# Patient Record
Sex: Female | Born: 1975 | Race: White | Hispanic: No | Marital: Married | Smoking: Former smoker
Health system: Southern US, Community
[De-identification: ages and names within clinical notes are randomized; demographics above are authoritative.]

## PROBLEM LIST (undated history)

## (undated) DIAGNOSIS — M459 Ankylosing spondylitis of unspecified sites in spine: Secondary | ICD-10-CM

## (undated) DIAGNOSIS — B977 Papillomavirus as the cause of diseases classified elsewhere: Secondary | ICD-10-CM

## (undated) DIAGNOSIS — E039 Hypothyroidism, unspecified: Secondary | ICD-10-CM

## (undated) DIAGNOSIS — T7840XA Allergy, unspecified, initial encounter: Secondary | ICD-10-CM

## (undated) DIAGNOSIS — J45909 Unspecified asthma, uncomplicated: Secondary | ICD-10-CM

## (undated) DIAGNOSIS — R079 Chest pain, unspecified: Secondary | ICD-10-CM

## (undated) DIAGNOSIS — R7303 Prediabetes: Secondary | ICD-10-CM

## (undated) DIAGNOSIS — N3010 Interstitial cystitis (chronic) without hematuria: Secondary | ICD-10-CM

## (undated) DIAGNOSIS — M1990 Unspecified osteoarthritis, unspecified site: Secondary | ICD-10-CM

## (undated) DIAGNOSIS — Z9889 Other specified postprocedural states: Secondary | ICD-10-CM

## (undated) DIAGNOSIS — F32A Depression, unspecified: Secondary | ICD-10-CM

## (undated) DIAGNOSIS — Z9049 Acquired absence of other specified parts of digestive tract: Secondary | ICD-10-CM

## (undated) DIAGNOSIS — M5090 Cervical disc disorder, unspecified, unspecified cervical region: Secondary | ICD-10-CM

## (undated) DIAGNOSIS — R599 Enlarged lymph nodes, unspecified: Secondary | ICD-10-CM

## (undated) DIAGNOSIS — R202 Paresthesia of skin: Secondary | ICD-10-CM

## (undated) DIAGNOSIS — K219 Gastro-esophageal reflux disease without esophagitis: Secondary | ICD-10-CM

## (undated) DIAGNOSIS — F329 Major depressive disorder, single episode, unspecified: Secondary | ICD-10-CM

## (undated) DIAGNOSIS — IMO0002 Reserved for concepts with insufficient information to code with codable children: Secondary | ICD-10-CM

## (undated) HISTORY — DX: Ankylosing spondylitis of unspecified sites in spine: M45.9

## (undated) HISTORY — DX: Allergy, unspecified, initial encounter: T78.40XA

## (undated) HISTORY — DX: Enlarged lymph nodes, unspecified: R59.9

## (undated) HISTORY — DX: Acquired absence of other specified parts of digestive tract: Z90.49

## (undated) HISTORY — PX: TONSILLECTOMY: SUR1361

---

## 2003-09-12 HISTORY — PX: TOTAL THYROIDECTOMY: SHX2547

## 2007-07-14 ENCOUNTER — Ambulatory Visit: Payer: Self-pay | Admitting: Family Medicine

## 2007-10-17 ENCOUNTER — Encounter: Payer: Self-pay | Admitting: General Practice

## 2007-11-03 ENCOUNTER — Ambulatory Visit: Payer: Self-pay | Admitting: Family Medicine

## 2008-02-19 ENCOUNTER — Ambulatory Visit: Payer: Self-pay | Admitting: Internal Medicine

## 2008-07-02 ENCOUNTER — Inpatient Hospital Stay: Payer: Self-pay | Admitting: Internal Medicine

## 2008-07-02 ENCOUNTER — Ambulatory Visit: Payer: Self-pay | Admitting: Internal Medicine

## 2008-10-27 ENCOUNTER — Ambulatory Visit: Payer: Self-pay | Admitting: Neurology

## 2008-11-26 ENCOUNTER — Encounter: Payer: Self-pay | Admitting: Neurology

## 2009-01-21 ENCOUNTER — Ambulatory Visit: Payer: Self-pay | Admitting: Neurology

## 2009-04-19 ENCOUNTER — Encounter: Payer: Self-pay | Admitting: Neurology

## 2009-05-03 ENCOUNTER — Ambulatory Visit: Payer: Self-pay | Admitting: Gastroenterology

## 2009-05-05 ENCOUNTER — Ambulatory Visit: Payer: Self-pay | Admitting: Gastroenterology

## 2009-05-12 ENCOUNTER — Encounter: Payer: Self-pay | Admitting: Neurology

## 2009-05-21 ENCOUNTER — Ambulatory Visit: Payer: Self-pay | Admitting: General Practice

## 2009-05-26 ENCOUNTER — Emergency Department: Payer: Self-pay | Admitting: Emergency Medicine

## 2009-06-11 ENCOUNTER — Encounter: Payer: Self-pay | Admitting: Neurology

## 2009-06-16 ENCOUNTER — Ambulatory Visit: Payer: Self-pay | Admitting: Cardiology

## 2010-01-07 ENCOUNTER — Other Ambulatory Visit: Payer: Self-pay | Admitting: Dermatology

## 2010-04-02 ENCOUNTER — Ambulatory Visit: Payer: Self-pay | Admitting: Internal Medicine

## 2010-06-06 ENCOUNTER — Ambulatory Visit: Payer: Self-pay | Admitting: General Practice

## 2010-06-11 ENCOUNTER — Ambulatory Visit: Payer: Self-pay | Admitting: General Practice

## 2010-12-30 ENCOUNTER — Other Ambulatory Visit: Payer: Self-pay | Admitting: Family Medicine

## 2011-03-12 ENCOUNTER — Ambulatory Visit: Payer: Self-pay | Admitting: Internal Medicine

## 2011-06-04 ENCOUNTER — Ambulatory Visit: Payer: Self-pay

## 2012-02-20 ENCOUNTER — Ambulatory Visit: Payer: Self-pay | Admitting: Urology

## 2012-08-02 ENCOUNTER — Ambulatory Visit: Payer: Self-pay | Admitting: Family Medicine

## 2012-08-13 ENCOUNTER — Ambulatory Visit: Payer: Self-pay | Admitting: Family Medicine

## 2012-11-05 ENCOUNTER — Ambulatory Visit: Payer: Self-pay

## 2012-11-05 LAB — TSH: Thyroid Stimulating Horm: 0.495 u[IU]/mL

## 2013-06-26 ENCOUNTER — Ambulatory Visit: Payer: Self-pay | Admitting: General Practice

## 2013-08-21 ENCOUNTER — Ambulatory Visit: Payer: Self-pay | Admitting: Orthopedic Surgery

## 2013-12-31 ENCOUNTER — Other Ambulatory Visit: Payer: Self-pay | Admitting: Family Medicine

## 2013-12-31 LAB — TSH: Thyroid Stimulating Horm: 1.09 u[IU]/mL

## 2014-01-30 DIAGNOSIS — E039 Hypothyroidism, unspecified: Secondary | ICD-10-CM | POA: Insufficient documentation

## 2014-02-10 DIAGNOSIS — K219 Gastro-esophageal reflux disease without esophagitis: Secondary | ICD-10-CM | POA: Insufficient documentation

## 2014-08-27 ENCOUNTER — Emergency Department: Payer: Self-pay | Admitting: Emergency Medicine

## 2014-08-27 LAB — CBC WITH DIFFERENTIAL/PLATELET
Basophil #: 0.1 10*3/uL (ref 0.0–0.1)
Basophil %: 0.6 %
Eosinophil #: 0.3 10*3/uL (ref 0.0–0.7)
Eosinophil %: 1.9 %
HCT: 40.6 % (ref 35.0–47.0)
HGB: 13.4 g/dL (ref 12.0–16.0)
Lymphocyte #: 3.4 10*3/uL (ref 1.0–3.6)
Lymphocyte %: 24.8 %
MCH: 28.7 pg (ref 26.0–34.0)
MCHC: 32.9 g/dL (ref 32.0–36.0)
MCV: 87 fL (ref 80–100)
Monocyte #: 1.1 x10 3/mm — ABNORMAL HIGH (ref 0.2–0.9)
Monocyte %: 7.8 %
Neutrophil #: 8.9 10*3/uL — ABNORMAL HIGH (ref 1.4–6.5)
Neutrophil %: 64.9 %
Platelet: 406 10*3/uL (ref 150–440)
RBC: 4.65 10*6/uL (ref 3.80–5.20)
RDW: 13.6 % (ref 11.5–14.5)
WBC: 13.7 10*3/uL — ABNORMAL HIGH (ref 3.6–11.0)

## 2014-08-27 LAB — BASIC METABOLIC PANEL
Anion Gap: 11 (ref 7–16)
BUN: 8 mg/dL (ref 7–18)
Calcium, Total: 8.8 mg/dL (ref 8.5–10.1)
Chloride: 104 mmol/L (ref 98–107)
Co2: 23 mmol/L (ref 21–32)
Creatinine: 0.69 mg/dL (ref 0.60–1.30)
EGFR (African American): >60
EGFR (Non-African Amer.): >60
Glucose: 100 mg/dL — ABNORMAL HIGH (ref 65–99)
Osmolality: 274 (ref 275–301)
Potassium: 3.7 mmol/L (ref 3.5–5.1)
Sodium: 138 mmol/L (ref 136–145)

## 2014-08-27 LAB — TROPONIN I: Troponin-I: <0.02 ng/mL

## 2014-12-16 ENCOUNTER — Other Ambulatory Visit
Admit: 2014-12-16 | Disposition: A | Discharge status: Home / Self Care | Payer: Self-pay | Attending: Internal Medicine | Admitting: Internal Medicine

## 2014-12-16 LAB — TSH: Thyroid Stimulating Horm: 5.062 u[IU]/mL — ABNORMAL HIGH

## 2015-01-26 ENCOUNTER — Other Ambulatory Visit
Admission: RE | Admit: 2015-01-26 | Discharge: 2015-01-26 | Disposition: A | Discharge status: Home / Self Care | Payer: 59 | Source: Ambulatory Visit | Attending: Internal Medicine | Admitting: Internal Medicine

## 2015-01-26 DIAGNOSIS — G629 Polyneuropathy, unspecified: Secondary | ICD-10-CM | POA: Insufficient documentation

## 2015-01-26 LAB — VITAMIN B12: Vitamin B-12: 630 pg/mL (ref 180–914)

## 2015-01-27 LAB — HEMOGLOBIN A1C: Hgb A1c MFr Bld: 5.5 % (ref 4.0–6.0)

## 2015-07-21 ENCOUNTER — Ambulatory Visit: Payer: 59 | Admitting: Physical Therapy

## 2015-09-22 ENCOUNTER — Other Ambulatory Visit: Payer: Self-pay | Admitting: Obstetrics and Gynecology

## 2015-09-22 ENCOUNTER — Ambulatory Visit
Admission: RE | Admit: 2015-09-22 | Discharge: 2015-09-22 | Disposition: A | Discharge status: Home / Self Care | Payer: 59 | Source: Ambulatory Visit | Attending: Obstetrics and Gynecology | Admitting: Obstetrics and Gynecology

## 2015-09-22 DIAGNOSIS — Z1231 Encounter for screening mammogram for malignant neoplasm of breast: Secondary | ICD-10-CM

## 2015-09-28 ENCOUNTER — Other Ambulatory Visit: Payer: Self-pay | Admitting: Obstetrics and Gynecology

## 2015-09-28 DIAGNOSIS — R928 Other abnormal and inconclusive findings on diagnostic imaging of breast: Secondary | ICD-10-CM

## 2015-10-06 ENCOUNTER — Ambulatory Visit
Admission: RE | Admit: 2015-10-06 | Discharge: 2015-10-06 | Disposition: A | Discharge status: Home / Self Care | Payer: 59 | Source: Ambulatory Visit | Attending: Obstetrics and Gynecology | Admitting: Obstetrics and Gynecology

## 2015-10-06 DIAGNOSIS — R928 Other abnormal and inconclusive findings on diagnostic imaging of breast: Secondary | ICD-10-CM

## 2015-10-06 DIAGNOSIS — N6489 Other specified disorders of breast: Secondary | ICD-10-CM | POA: Diagnosis not present

## 2015-10-08 ENCOUNTER — Encounter: Payer: Self-pay | Admitting: *Deleted

## 2015-10-11 ENCOUNTER — Ambulatory Visit: Payer: 59 | Admitting: Anesthesiology

## 2015-10-11 ENCOUNTER — Encounter
Admission: RE | Disposition: A | Discharge status: Home / Self Care | Payer: Self-pay | Source: Ambulatory Visit | Attending: Gastroenterology

## 2015-10-11 ENCOUNTER — Ambulatory Visit
Admission: RE | Admit: 2015-10-11 | Discharge: 2015-10-11 | Disposition: A | Discharge status: Home / Self Care | Payer: 59 | Source: Ambulatory Visit | Attending: Gastroenterology | Admitting: Gastroenterology

## 2015-10-11 DIAGNOSIS — K644 Residual hemorrhoidal skin tags: Secondary | ICD-10-CM | POA: Insufficient documentation

## 2015-10-11 DIAGNOSIS — Z882 Allergy status to sulfonamides status: Secondary | ICD-10-CM | POA: Insufficient documentation

## 2015-10-11 DIAGNOSIS — K5790 Diverticulosis of intestine, part unspecified, without perforation or abscess without bleeding: Secondary | ICD-10-CM | POA: Diagnosis not present

## 2015-10-11 DIAGNOSIS — D120 Benign neoplasm of cecum: Secondary | ICD-10-CM | POA: Insufficient documentation

## 2015-10-11 DIAGNOSIS — E7439 Other disorders of intestinal carbohydrate absorption: Secondary | ICD-10-CM | POA: Insufficient documentation

## 2015-10-11 DIAGNOSIS — J45909 Unspecified asthma, uncomplicated: Secondary | ICD-10-CM | POA: Insufficient documentation

## 2015-10-11 DIAGNOSIS — K2210 Ulcer of esophagus without bleeding: Secondary | ICD-10-CM | POA: Insufficient documentation

## 2015-10-11 DIAGNOSIS — Z86010 Personal history of colonic polyps: Secondary | ICD-10-CM | POA: Diagnosis not present

## 2015-10-11 DIAGNOSIS — R109 Unspecified abdominal pain: Secondary | ICD-10-CM | POA: Diagnosis not present

## 2015-10-11 DIAGNOSIS — D127 Benign neoplasm of rectosigmoid junction: Secondary | ICD-10-CM | POA: Diagnosis not present

## 2015-10-11 DIAGNOSIS — E039 Hypothyroidism, unspecified: Secondary | ICD-10-CM | POA: Insufficient documentation

## 2015-10-11 DIAGNOSIS — F329 Major depressive disorder, single episode, unspecified: Secondary | ICD-10-CM | POA: Insufficient documentation

## 2015-10-11 DIAGNOSIS — K224 Dyskinesia of esophagus: Secondary | ICD-10-CM | POA: Diagnosis not present

## 2015-10-11 DIAGNOSIS — R1013 Epigastric pain: Secondary | ICD-10-CM | POA: Diagnosis present

## 2015-10-11 DIAGNOSIS — Z79899 Other long term (current) drug therapy: Secondary | ICD-10-CM | POA: Insufficient documentation

## 2015-10-11 DIAGNOSIS — Z87891 Personal history of nicotine dependence: Secondary | ICD-10-CM | POA: Insufficient documentation

## 2015-10-11 DIAGNOSIS — R1310 Dysphagia, unspecified: Secondary | ICD-10-CM | POA: Insufficient documentation

## 2015-10-11 DIAGNOSIS — K641 Second degree hemorrhoids: Secondary | ICD-10-CM | POA: Diagnosis not present

## 2015-10-11 DIAGNOSIS — K449 Diaphragmatic hernia without obstruction or gangrene: Secondary | ICD-10-CM | POA: Diagnosis not present

## 2015-10-11 DIAGNOSIS — K219 Gastro-esophageal reflux disease without esophagitis: Secondary | ICD-10-CM | POA: Diagnosis present

## 2015-10-11 DIAGNOSIS — M1990 Unspecified osteoarthritis, unspecified site: Secondary | ICD-10-CM | POA: Diagnosis not present

## 2015-10-11 DIAGNOSIS — K648 Other hemorrhoids: Secondary | ICD-10-CM | POA: Diagnosis not present

## 2015-10-11 DIAGNOSIS — Z885 Allergy status to narcotic agent status: Secondary | ICD-10-CM | POA: Insufficient documentation

## 2015-10-11 DIAGNOSIS — N6489 Other specified disorders of breast: Secondary | ICD-10-CM

## 2015-10-11 DIAGNOSIS — K635 Polyp of colon: Secondary | ICD-10-CM | POA: Diagnosis not present

## 2015-10-11 DIAGNOSIS — K208 Other esophagitis: Secondary | ICD-10-CM | POA: Diagnosis not present

## 2015-10-11 DIAGNOSIS — K210 Gastro-esophageal reflux disease with esophagitis: Secondary | ICD-10-CM | POA: Insufficient documentation

## 2015-10-11 DIAGNOSIS — R1030 Lower abdominal pain, unspecified: Secondary | ICD-10-CM | POA: Insufficient documentation

## 2015-10-11 DIAGNOSIS — Z888 Allergy status to other drugs, medicaments and biological substances status: Secondary | ICD-10-CM | POA: Diagnosis not present

## 2015-10-11 HISTORY — DX: Paresthesia of skin: R20.2

## 2015-10-11 HISTORY — DX: Unspecified osteoarthritis, unspecified site: M19.90

## 2015-10-11 HISTORY — DX: Depression, unspecified: F32.A

## 2015-10-11 HISTORY — DX: Other specified postprocedural states: Z98.890

## 2015-10-11 HISTORY — PX: COLONOSCOPY WITH PROPOFOL: SHX5780

## 2015-10-11 HISTORY — DX: Hypothyroidism, unspecified: E03.9

## 2015-10-11 HISTORY — DX: Papillomavirus as the cause of diseases classified elsewhere: B97.7

## 2015-10-11 HISTORY — DX: Interstitial cystitis (chronic) without hematuria: N30.10

## 2015-10-11 HISTORY — DX: Reserved for concepts with insufficient information to code with codable children: IMO0002

## 2015-10-11 HISTORY — DX: Major depressive disorder, single episode, unspecified: F32.9

## 2015-10-11 HISTORY — DX: Unspecified asthma, uncomplicated: J45.909

## 2015-10-11 HISTORY — DX: Allergy, unspecified, initial encounter: T78.40XA

## 2015-10-11 HISTORY — PX: ESOPHAGOGASTRODUODENOSCOPY (EGD) WITH PROPOFOL: SHX5813

## 2015-10-11 HISTORY — DX: Gastro-esophageal reflux disease without esophagitis: K21.9

## 2015-10-11 HISTORY — DX: Cervical disc disorder, unspecified, unspecified cervical region: M50.90

## 2015-10-11 LAB — POCT PREGNANCY, URINE: Preg Test, Ur: NEGATIVE

## 2015-10-11 SURGERY — ESOPHAGOGASTRODUODENOSCOPY (EGD) WITH PROPOFOL
Anesthesia: General

## 2015-10-11 MED ORDER — LIDOCAINE HCL (CARDIAC) 20 MG/ML IV SOLN
INTRAVENOUS | Status: DC | PRN
  Administered 2015-10-11: 100 mg via INTRAVENOUS

## 2015-10-11 MED ORDER — PROPOFOL 10 MG/ML IV BOLUS
INTRAVENOUS | Status: DC | PRN
  Administered 2015-10-11: 150 mg via INTRAVENOUS

## 2015-10-11 MED ORDER — SODIUM CHLORIDE 0.9 % IV SOLN: INTRAVENOUS | Status: DC

## 2015-10-11 MED ORDER — SODIUM CHLORIDE 0.9 % IV SOLN
INTRAVENOUS | Status: DC
  Administered 2015-10-11: 12:00:00 via INTRAVENOUS
  Administered 2015-10-11: 1000 mL via INTRAVENOUS

## 2015-10-11 MED ORDER — ONDANSETRON HCL 4 MG/2ML IJ SOLN
INTRAMUSCULAR | Status: DC | PRN
  Administered 2015-10-11: 4 mg via INTRAVENOUS

## 2015-10-11 MED ORDER — PROPOFOL 500 MG/50ML IV EMUL
INTRAVENOUS | Status: DC | PRN
  Administered 2015-10-11: 180 ug/kg/min via INTRAVENOUS

## 2015-10-11 MED ORDER — MIDAZOLAM HCL 5 MG/5ML IJ SOLN
INTRAMUSCULAR | Status: DC | PRN
  Administered 2015-10-11: 2 mg via INTRAVENOUS

## 2015-10-11 NOTE — Anesthesia Procedure Notes (Signed)
Date/Time: 10/11/2015 11:50 AM Performed by: Allean Found Pre-anesthesia Checklist: Emergency Drugs available, Patient identified, Suction available, Patient being monitored and Timeout performed Patient Re-evaluated:Patient Re-evaluated prior to inductionOxygen Delivery Method: Nasal cannula

## 2015-10-11 NOTE — H&P (Signed)
Outpatient short stay form Pre-procedure 10/11/2015 11:26 AM Lollie Sails MD  Primary Physician: Emily Filbert  Reason for visit:  EGD and colonoscopy  History of present illness:  Patient is a 40 year old female presenting today for procedures as above. 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 Also has some lower abdominal discomfort and epigastric discomfort. There is some nausea but no vomiting. Does take omeprazole 40 mg daily. He has a history of GERD. She also has a history of interstitial cystitis.  She tolerated her prep well. She takes no aspirin or blood thinning products.    Current facility-administered medications:  .  0.9 %  sodium chloride infusion, , Intravenous, Continuous, Lollie Sails, MD, Last Rate: 20 mL/hr at 10/11/15 0944, 1,000 mL at 10/11/15 0944 .  0.9 %  sodium chloride infusion, , Intravenous, Continuous, Lollie Sails, MD  Prescriptions prior to admission  Medication Sig Dispense Refill Last Dose  . drospirenone-ethinyl estradiol (YAZ) 3-0.02 MG tablet Take 1 tablet by mouth daily.     . DULoxetine (CYMBALTA) 60 MG capsule Take 60 mg by mouth daily.     Marland Kitchen ibuprofen (ADVIL,MOTRIN) 200 MG tablet Take 200 mg by mouth every 6 (six) hours as needed.     Marland Kitchen levothyroxine (SYNTHROID, LEVOTHROID) 88 MCG tablet Take 88 mcg by mouth daily before breakfast.     . liothyronine (CYTOMEL) 25 MCG tablet Take 25 mcg by mouth daily.     Marland Kitchen omeprazole (PRILOSEC) 40 MG capsule Take 40 mg by mouth daily.     . traZODone (DESYREL) 50 MG tablet Take 50 mg by mouth at bedtime.        Allergies  Allergen Reactions  . Codeine   . Effexor Xr [Venlafaxine Hcl]   . Levaquin [Levofloxacin In D5w]   . Lodine [Etodolac]   . Prednisone   . Seroquel [Quetiapine Fumarate]   . Sulfa Antibiotics   . Zanaflex [Tizanidine Hcl]      Past Medical History  Diagnosis Date  . Allergic state   . Asthma   . Cervical disc disease   . GERD (gastroesophageal  reflux disease)   . Glucose intolerance (impaired glucose tolerance)   . Depression   . HPV (human papilloma virus) infection   . Interstitial cystitis   . LGSIL (low grade squamous intraepithelial dysplasia)   . Paresthesia   . Hypothyroidism   . H/O colonoscopy   . Arthritis     Review of systems:      Physical Exam    Heart and lungs: Regular rate and rhythm without rub or gallop, lungs are bilaterally clear.    HEENT: Normocephalic atraumatic eyes are anicteric    Other:     Pertinant exam for procedure: Soft nontender nondistended bowel sounds positive normoactive. Some mild discomfort palpation the epigastric region and across the lower abdomen area masses or rebound.    Planned proceedures: EGD and colonoscopy with indicated procedures. I have discussed the risks benefits and complications of procedures to include not limited to bleeding, infection, perforation and the risk of sedation and the patient wishes to proceed.    Lollie Sails, MD Gastroenterology 10/11/2015  11:26 AM

## 2015-10-11 NOTE — Anesthesia Preprocedure Evaluation (Signed)
Anesthesia Evaluation  Patient identified by MRN, date of birth, ID band Patient awake    Reviewed: Allergy & Precautions, NPO status , Patient's Chart, lab work & pertinent test results, reviewed documented beta blocker date and time   Airway Mallampati: II  TM Distance: >3 FB     Dental  (+) Chipped   Pulmonary asthma , former smoker,           Cardiovascular      Neuro/Psych PSYCHIATRIC DISORDERS Depression    GI/Hepatic GERD  Controlled,  Endo/Other  Hypothyroidism   Renal/GU      Musculoskeletal  (+) Arthritis ,   Abdominal   Peds  Hematology   Anesthesia Other Findings   Reproductive/Obstetrics                             Anesthesia Physical Anesthesia Plan  ASA: II  Anesthesia Plan: General   Post-op Pain Management:    Induction: Intravenous  Airway Management Planned: Nasal Cannula  Additional Equipment:   Intra-op Plan:   Post-operative Plan:   Informed Consent: I have reviewed the patients History and Physical, chart, labs and discussed the procedure including the risks, benefits and alternatives for the proposed anesthesia with the patient or authorized representative who has indicated his/her understanding and acceptance.     Plan Discussed with: CRNA  Anesthesia Plan Comments:         Anesthesia Quick Evaluation

## 2015-10-11 NOTE — Op Note (Addendum)
[facility] Gastroenterology Patient Name: Hailey [name] Procedure Date: 10/11/2015 11:29 AM MRN: [id number] Account #: [id number] Date of Birth: 1975-11-17 Admit Type: Outpatient Age: 40 Room: [facility] ENDO ROOM 3 Gender: Female Note Status: Supervisor Override Procedure:         Colonoscopy Indications:       Lower abdominal pain, Personal history of colonic polyps Providers:         Lollie Sails, MD Referring MD:      Rusty Aus, MD (Referring MD) Medicines:         Monitored Anesthesia Care Complications:     No immediate complications. Procedure:         Pre-Anesthesia Assessment:                    - ASA Grade Assessment: II - A patient with mild systemic                     disease.                    After obtaining informed consent, the colonoscope was                     passed under direct vision. Throughout the procedure, the                     patient's blood pressure, pulse, and oxygen saturations                     were monitored continuously. The Colonoscope was                     introduced through the anus and advanced to the the cecum,                     identified by appendiceal orifice and ileocecal valve. The                     colonoscopy was performed without difficulty. The patient                     tolerated the procedure well. The quality of the bowel                     preparation was good. Findings:      A 3 mm polyp was found at the appendiceal orifice. The polyp was       sessile. The polyp was removed with a cold biopsy forceps. Resection and       retrieval were complete.      A 3 mm polyp was found in the recto-sigmoid colon. The polyp was       sessile. The polyp was removed with a cold biopsy forceps. Resection and       retrieval were complete.      External and internal hemorrhoids were found during retroflexion, during       perianal exam and during anoscopy. The hemorrhoids were Grade II       (internal  hemorrhoids that prolapse but reduce spontaneously). Impression:        - One 3 mm polyp at the appendiceal orifice. Resected and                     retrieved.                    -  One 3 mm polyp at the recto-sigmoid colon. Resected and                     retrieved.                    - External and internal hemorrhoids. Recommendation:    - Discharge patient to home.                    - Await pathology results.                    - phillips colon health daily for 6 weeks.                    - Return to GI clinic in 1 month.                    - Use Analpram HC Cream 2.5%: Apply externally TID for 10                     days. Procedure Code(s): --- Professional ---                    [phone], Colonoscopy, flexible; with biopsy, single or                     multiple Diagnosis Code(s): --- Professional ---                    D12.1, Benign neoplasm of appendix                    D12.7, Benign neoplasm of rectosigmoid junction                    K64.1, Second degree hemorrhoids                    R10.30, Lower abdominal pain, unspecified                    Z86.010, Personal history of colonic polyps CPT copyright 2014 American Medical Association. All rights reserved. The codes documented in this report are preliminary and upon coder review may  be revised to meet current compliance requirements. Lollie Sails, MD 10/11/2015 12:25:53 PM This report has been signed electronically. Number of Addenda: 0 Note Initiated On: 10/11/2015 11:29 AM Scope Withdrawal Time: 0 hours 11 minutes 5 seconds  Total Procedure Duration: 0 hours 17 minutes 25 seconds       [facility]

## 2015-10-11 NOTE — Op Note (Signed)
[facility] Gastroenterology Patient Name: Hailey [name] Procedure Date: 10/11/2015 11:30 AM MRN: [id number] Account #: [id number] Date of Birth: 11-Mar-1976 Admit Type: Outpatient Age: 40 Room: [facility] ENDO ROOM 3 Gender: Female Note Status: Finalized Procedure:         Upper GI endoscopy Indications:       Epigastric abdominal pain, Dyspepsia Providers:         Lollie Sails, MD Referring MD:      Rusty Aus, MD (Referring MD) Medicines:         Monitored Anesthesia Care Complications:     No immediate complications. Procedure:         Pre-Anesthesia Assessment:                    - ASA Grade Assessment: II - A patient with mild systemic                     disease.                    After obtaining informed consent, the endoscope was passed                     under direct vision. Throughout the procedure, the                     patient's blood pressure, pulse, and oxygen saturations                     were monitored continuously. The Endoscope was introduced                     through the mouth, and advanced to the third part of                     duodenum. The patient tolerated the procedure well. Findings:      LA Grade A (one or more mucosal breaks less than 5 mm, not extending       between tops of 2 mucosal folds) esophagitis with no bleeding was found.       Biopsies were taken with a cold forceps for histology.      A small hiatus hernia was present.      The entire examined stomach was normal. Biopsies were taken with a cold       forceps for histology.      The examined duodenum was normal.      Small polyp noted in the upper cardia, difficult to access, however       inspection shows c/w fundic gland polyp without inflammation, smooth. Impression:        - LA Grade A erosive esophagitis. Biopsied.                    - Small hiatus hernia.                    - Normal stomach. Biopsied.                    - Normal examined  duodenum. Recommendation:    - Perform a colonoscopy today.                    - Use Protonix (pantoprazole) 40 mg PO daily daily.                    -  Return to GI clinic in 4 weeks. Procedure Code(s): --- Professional ---                    [phone], Esophagogastroduodenoscopy, flexible, transoral;                     with biopsy, single or multiple Diagnosis Code(s): --- Professional ---                    K20.8, Other esophagitis                    K44.9, Diaphragmatic hernia without obstruction or gangrene                    R10.13, Epigastric pain                    K30, Functional dyspepsia CPT copyright 2014 American Medical Association. All rights reserved. The codes documented in this report are preliminary and upon coder review may  be revised to meet current compliance requirements. Lollie Sails, MD 10/11/2015 12:01:19 PM This report has been signed electronically. Number of Addenda: 0 Note Initiated On: 10/11/2015 11:30 AM      [facility]

## 2015-10-11 NOTE — Transfer of Care (Signed)
Immediate Anesthesia Transfer of Care Note  Patient: Hailey [name]  Procedure(s) Performed: Procedure(s): ESOPHAGOGASTRODUODENOSCOPY (EGD) WITH PROPOFOL (N/A) COLONOSCOPY WITH PROPOFOL (N/A)  Patient Location: PACU  Anesthesia Type:General  Level of Consciousness: awake  Airway & Oxygen Therapy: Patient Spontanous Breathing and Patient connected to nasal cannula oxygen  Post-op Assessment: Report given to RN and Post -op Vital signs reviewed and stable  Post vital signs: Reviewed and stable  Last Vitals:  Filed Vitals:   10/11/15 0926  BP: 126/77  Pulse: 69  Temp: 37.1 C  Resp: 17    Complications: No apparent anesthesia complications  [id number] 14 16 @ 12:35pm

## 2015-10-11 NOTE — Anesthesia Postprocedure Evaluation (Signed)
Anesthesia Post Note  Patient: Hailey [name]  Procedure(s) Performed: Procedure(s) (LRB): ESOPHAGOGASTRODUODENOSCOPY (EGD) WITH PROPOFOL (N/A) COLONOSCOPY WITH PROPOFOL (N/A)  Patient location during evaluation: Endoscopy Anesthesia Type: General Pain management: pain level controlled Vital Signs Assessment: post-procedure vital signs reviewed and stable Respiratory status: spontaneous breathing Cardiovascular status: blood pressure returned to baseline Anesthetic complications: no    Last Vitals:  Filed Vitals:   10/11/15 1253 10/11/15 1303  BP: 98/67 107/70  Pulse: 59 67  Temp:    Resp: 11 13    Last Pain: There were no vitals filed for this visit.               Jalexis Breed S

## 2015-10-12 ENCOUNTER — Encounter: Payer: Self-pay | Admitting: Gastroenterology

## 2015-10-13 LAB — SURGICAL PATHOLOGY

## 2015-10-14 ENCOUNTER — Other Ambulatory Visit: Payer: Self-pay | Admitting: Obstetrics and Gynecology

## 2015-10-14 DIAGNOSIS — N6489 Other specified disorders of breast: Secondary | ICD-10-CM

## 2015-10-18 ENCOUNTER — Other Ambulatory Visit: Payer: Self-pay | Admitting: Obstetrics and Gynecology

## 2015-10-18 ENCOUNTER — Ambulatory Visit
Admission: RE | Admit: 2015-10-18 | Discharge: 2015-10-18 | Disposition: A | Discharge status: Home / Self Care | Payer: 59 | Source: Ambulatory Visit | Attending: Obstetrics and Gynecology | Admitting: Obstetrics and Gynecology

## 2015-10-18 DIAGNOSIS — N62 Hypertrophy of breast: Secondary | ICD-10-CM | POA: Diagnosis not present

## 2015-10-18 DIAGNOSIS — N6489 Other specified disorders of breast: Secondary | ICD-10-CM

## 2015-10-18 HISTORY — PX: BREAST BIOPSY: SHX20

## 2015-10-21 DIAGNOSIS — M1712 Unilateral primary osteoarthritis, left knee: Secondary | ICD-10-CM | POA: Diagnosis not present

## 2015-10-21 DIAGNOSIS — M25562 Pain in left knee: Secondary | ICD-10-CM | POA: Diagnosis not present

## 2015-10-22 ENCOUNTER — Other Ambulatory Visit: Payer: Self-pay | Admitting: Orthopedic Surgery

## 2015-10-22 DIAGNOSIS — M1712 Unilateral primary osteoarthritis, left knee: Secondary | ICD-10-CM

## 2015-10-22 DIAGNOSIS — M25562 Pain in left knee: Secondary | ICD-10-CM

## 2015-10-25 ENCOUNTER — Ambulatory Visit
Admission: RE | Admit: 2015-10-25 | Discharge: 2015-10-25 | Disposition: A | Discharge status: Home / Self Care | Payer: 59 | Source: Ambulatory Visit | Attending: Orthopedic Surgery | Admitting: Orthopedic Surgery

## 2015-10-25 DIAGNOSIS — M1712 Unilateral primary osteoarthritis, left knee: Secondary | ICD-10-CM | POA: Insufficient documentation

## 2015-10-25 DIAGNOSIS — M25462 Effusion, left knee: Secondary | ICD-10-CM | POA: Insufficient documentation

## 2015-10-25 DIAGNOSIS — M2242 Chondromalacia patellae, left knee: Secondary | ICD-10-CM | POA: Insufficient documentation

## 2015-10-25 DIAGNOSIS — M25562 Pain in left knee: Secondary | ICD-10-CM | POA: Insufficient documentation

## 2015-11-01 DIAGNOSIS — F331 Major depressive disorder, recurrent, moderate: Secondary | ICD-10-CM | POA: Diagnosis not present

## 2015-11-10 ENCOUNTER — Ambulatory Visit: Admission: RE | Admit: 2015-11-10 | Payer: 59 | Source: Ambulatory Visit

## 2015-11-12 DIAGNOSIS — R300 Dysuria: Secondary | ICD-10-CM | POA: Diagnosis not present

## 2015-11-26 DIAGNOSIS — F331 Major depressive disorder, recurrent, moderate: Secondary | ICD-10-CM | POA: Diagnosis not present

## 2015-12-20 ENCOUNTER — Ambulatory Visit: Admission: RE | Admit: 2015-12-20 | Payer: 59 | Source: Ambulatory Visit

## 2015-12-20 ENCOUNTER — Ambulatory Visit
Admission: RE | Admit: 2015-12-20 | Discharge: 2015-12-20 | Disposition: A | Discharge status: Home / Self Care | Payer: 59 | Source: Ambulatory Visit | Attending: Obstetrics and Gynecology | Admitting: Obstetrics and Gynecology

## 2015-12-20 ENCOUNTER — Other Ambulatory Visit: Payer: Self-pay | Admitting: Obstetrics and Gynecology

## 2015-12-20 DIAGNOSIS — R109 Unspecified abdominal pain: Secondary | ICD-10-CM

## 2015-12-20 DIAGNOSIS — N898 Other specified noninflammatory disorders of vagina: Secondary | ICD-10-CM | POA: Diagnosis not present

## 2015-12-20 DIAGNOSIS — R102 Pelvic and perineal pain: Secondary | ICD-10-CM | POA: Insufficient documentation

## 2015-12-20 MED ORDER — IOPAMIDOL (ISOVUE-300) INJECTION 61%
100.0000 mL | Freq: Once | INTRAVENOUS | Status: AC | PRN
  Administered 2015-12-20: 100 mL via INTRAVENOUS

## 2016-01-21 DIAGNOSIS — F331 Major depressive disorder, recurrent, moderate: Secondary | ICD-10-CM | POA: Diagnosis not present

## 2016-02-11 DIAGNOSIS — F331 Major depressive disorder, recurrent, moderate: Secondary | ICD-10-CM | POA: Diagnosis not present

## 2016-02-14 DIAGNOSIS — M2550 Pain in unspecified joint: Secondary | ICD-10-CM | POA: Insufficient documentation

## 2016-02-14 DIAGNOSIS — M94262 Chondromalacia, left knee: Secondary | ICD-10-CM | POA: Insufficient documentation

## 2016-02-14 DIAGNOSIS — Z6841 Body Mass Index (BMI) 40.0 and over, adult: Secondary | ICD-10-CM | POA: Diagnosis not present

## 2016-02-14 DIAGNOSIS — Z6838 Body mass index (BMI) 38.0-38.9, adult: Secondary | ICD-10-CM | POA: Insufficient documentation

## 2016-03-03 ENCOUNTER — Emergency Department: Payer: PRIVATE HEALTH INSURANCE

## 2016-03-03 ENCOUNTER — Emergency Department
Admission: EM | Admit: 2016-03-03 | Discharge: 2016-03-03 | Disposition: A | Discharge status: Home / Self Care | Payer: PRIVATE HEALTH INSURANCE | Attending: Emergency Medicine | Admitting: Emergency Medicine

## 2016-03-03 DIAGNOSIS — S8002XA Contusion of left knee, initial encounter: Secondary | ICD-10-CM | POA: Diagnosis not present

## 2016-03-03 DIAGNOSIS — E039 Hypothyroidism, unspecified: Secondary | ICD-10-CM | POA: Diagnosis not present

## 2016-03-03 DIAGNOSIS — S0990XA Unspecified injury of head, initial encounter: Secondary | ICD-10-CM | POA: Diagnosis present

## 2016-03-03 DIAGNOSIS — S00531A Contusion of lip, initial encounter: Secondary | ICD-10-CM | POA: Insufficient documentation

## 2016-03-03 DIAGNOSIS — M1990 Unspecified osteoarthritis, unspecified site: Secondary | ICD-10-CM | POA: Insufficient documentation

## 2016-03-03 DIAGNOSIS — J45909 Unspecified asthma, uncomplicated: Secondary | ICD-10-CM | POA: Insufficient documentation

## 2016-03-03 DIAGNOSIS — W010XXA Fall on same level from slipping, tripping and stumbling without subsequent striking against object, initial encounter: Secondary | ICD-10-CM | POA: Insufficient documentation

## 2016-03-03 DIAGNOSIS — Y92239 Unspecified place in hospital as the place of occurrence of the external cause: Secondary | ICD-10-CM | POA: Diagnosis not present

## 2016-03-03 DIAGNOSIS — Y999 Unspecified external cause status: Secondary | ICD-10-CM | POA: Insufficient documentation

## 2016-03-03 DIAGNOSIS — Z87891 Personal history of nicotine dependence: Secondary | ICD-10-CM | POA: Diagnosis not present

## 2016-03-03 DIAGNOSIS — Y939 Activity, unspecified: Secondary | ICD-10-CM | POA: Insufficient documentation

## 2016-03-03 DIAGNOSIS — S161XXA Strain of muscle, fascia and tendon at neck level, initial encounter: Secondary | ICD-10-CM | POA: Insufficient documentation

## 2016-03-03 DIAGNOSIS — F329 Major depressive disorder, single episode, unspecified: Secondary | ICD-10-CM | POA: Insufficient documentation

## 2016-03-03 DIAGNOSIS — W19XXXA Unspecified fall, initial encounter: Secondary | ICD-10-CM

## 2016-03-03 DIAGNOSIS — S0083XA Contusion of other part of head, initial encounter: Secondary | ICD-10-CM

## 2016-03-03 MED ORDER — OXYCODONE-ACETAMINOPHEN 5-325 MG PO TABS
2.0000 | ORAL_TABLET | Freq: Once | ORAL | Status: AC
  Administered 2016-03-03: 2 via ORAL
  Filled 2016-03-03: qty 2

## 2016-03-03 MED ORDER — CYCLOBENZAPRINE HCL 10 MG PO TABS: 10.0000 mg | ORAL_TABLET | Freq: Three times a day (TID) | ORAL | Status: DC | PRN

## 2016-03-03 MED ORDER — IBUPROFEN 800 MG PO TABS: 800.0000 mg | ORAL_TABLET | Freq: Three times a day (TID) | ORAL | Status: DC | PRN

## 2016-03-03 NOTE — ED Notes (Signed)
Pt fell at hospital, denies syncope. C/o neck, face, and left knee pain

## 2016-03-03 NOTE — ED Provider Notes (Signed)
[facility] Emergency Department Provider Note        Time seen: ----------------------------------------- 8:24 AM on 03/03/2016 -----------------------------------------    I have reviewed the triage vital signs and the nursing notes.   HISTORY  Chief Complaint Fall    HPI Hailey [name] is a 40 y.o. female who presents to ER after she fell outside the cafeteria in the hospital. She states she tripped and fell, currently wearing flip-flops. She denies loss of consciousness, complaining of neck face and left knee pain. Patient states she fell forward landing on her face, hyperextending her neck. She denies weakness, or other reason for falling.   Past Medical History  Diagnosis Date  . Allergic state   . Asthma   . Cervical disc disease   . GERD (gastroesophageal reflux disease)   . Glucose intolerance (impaired glucose tolerance)   . Depression   . HPV (human papilloma virus) infection   . Interstitial cystitis   . LGSIL (low grade squamous intraepithelial dysplasia)   . Paresthesia   . Hypothyroidism   . H/O colonoscopy   . Arthritis     There are no active problems to display for this patient.   Past Surgical History  Procedure Laterality Date  . Tonsillectomy    . Total thyroidectomy  2005  . Esophagogastroduodenoscopy (egd) with propofol N/A 10/11/2015    Procedure: ESOPHAGOGASTRODUODENOSCOPY (EGD) WITH PROPOFOL;  Surgeon: Lollie Sails, MD;  Location: [facility] ENDOSCOPY;  Service: Endoscopy;  Laterality: N/A;  . Colonoscopy with propofol N/A 10/11/2015    Procedure: COLONOSCOPY WITH PROPOFOL;  Surgeon: Lollie Sails, MD;  Location: [facility] ENDOSCOPY;  Service: Endoscopy;  Laterality: N/A;    Allergies Codeine; Levaquin; Lodine; Seroquel; Sulfa antibiotics; and Zanaflex  Social History Social History  Substance Use Topics  . Smoking status: Former Research scientist (life sciences)  . Smokeless tobacco: Never Used  . Alcohol Use: Yes    Review of  Systems Constitutional: Negative for fever. Eyes: Negative for visual changes. Cardiovascular: Negative for chest pain. Respiratory: Negative for shortness of breath. Gastrointestinal: Negative for abdominal pain Musculoskeletal: Positive for head pain, neck pain, facial pain Skin: Small abrasion noted over the left knee Neurological: Positive for headache  ____________________________________________   PHYSICAL EXAM:  VITAL SIGNS: ED Triage Vitals  Enc Vitals Group     BP 03/03/16 0820 145/87 mmHg     Pulse Rate 03/03/16 0820 75     Resp --      Temp 03/03/16 0820 97.8 F (36.6 C)     Temp Source 03/03/16 0820 Oral     SpO2 03/03/16 0820 100 %     Weight 03/03/16 0820 230 lb (104.327 kg)     Height 03/03/16 0820 5\' 4"  (1.626 m)     Head Cir --      Peak Flow --      Pain Score --      Pain Loc --      Pain Edu? --      Excl. in [address]? --     Constitutional: Alert and oriented. Mild distress Eyes: Conjunctivae are normal. PERRL. Normal extraocular movements. ENT   Head: Upper lip swelling and contusion   Nose: No congestion/rhinnorhea.   Mouth/Throat: Mucous membranes are moist.   Neck: No stridor. Cardiovascular: Normal rate, regular rhythm. No murmurs, rubs, or gallops. Respiratory: Normal respiratory effort without tachypnea nor retractions. Breath sounds are clear and equal bilaterally. No wheezes/rales/rhonchi. Gastrointestinal: Soft and nontender. Normal bowel sounds Musculoskeletal: Tenderness over the  left knee, pain with range of motion of the left knee. Neurologic:  Normal speech and language. No gross focal neurologic deficits are appreciated.  Skin:  Skin is warm, dry and intact. No rash noted. Psychiatric: Mood and affect are normal. Patient tearful ____________________________________________  ED COURSE:  Pertinent labs & imaging results that were available during my care of the patient were reviewed by me and considered in my medical  decision making (see chart for details). Patient presents to ER after mechanical fall. We will obtain basic imaging, given oral pain medicine ____________________________________________  RADIOLOGY Images were viewed by me  CT head, maxillofacial, C-spine, left knee x-ray  IMPRESSION: Degenerative change, without acute osseous finding. IMPRESSION: Negative head, face and cervical spine CT scans  ____________________________________________  FINAL ASSESSMENT AND PLAN  Fall, minor head injury, facial contusion, cervical strain, knee contusion  Plan: Patient with imaging as dictated above. Patient presents to ER after mechanical fall. Superficial injuries were obtained. I will advise Motrin and Flexeril, ice pack to the wounds. She stable for outpatient follow-up.   Earleen Newport, MD   Note: This dictation was prepared with Dragon dictation. Any transcriptional errors that result from this process are unintentional   Earleen Newport, MD 03/03/16 [id number]

## 2016-03-03 NOTE — Discharge Instructions (Signed)
Cervical Strain and Sprain With Rehab Cervical strain and sprain are injuries that commonly occur with "whiplash" injuries. Whiplash occurs when the neck is forcefully whipped backward or forward, such as during a motor vehicle accident or during contact sports. The muscles, ligaments, tendons, discs, and nerves of the neck are susceptible to injury when this occurs. RISK FACTORS Risk of having a whiplash injury increases if:  Osteoarthritis of the spine.  Situations that make head or neck accidents or trauma more likely.  High-risk sports (football, rugby, wrestling, hockey, auto racing, gymnastics, diving, contact karate, or boxing).  Poor strength and flexibility of the neck.  Previous neck injury.  Poor tackling technique.  Improperly fitted or padded equipment. SYMPTOMS   Pain or stiffness in the front or back of neck or both.  Symptoms may present immediately or up to 24 hours after injury.  Dizziness, headache, nausea, and vomiting.  Muscle spasm with soreness and stiffness in the neck.  Tenderness and swelling at the injury site. PREVENTION  Learn and use proper technique (avoid tackling with the head, spearing, and head-butting; use proper falling techniques to avoid landing on the head).  Warm up and stretch properly before activity.  Maintain physical fitness:  Strength, flexibility, and endurance.  Cardiovascular fitness.  Wear properly fitted and padded protective equipment, such as padded soft collars, for participation in contact sports. PROGNOSIS  Recovery from cervical strain and sprain injuries is dependent on the extent of the injury. These injuries are usually curable in 1 week to 3 months with appropriate treatment.  RELATED COMPLICATIONS   Temporary numbness and weakness may occur if the nerve roots are damaged, and this may persist until the nerve has completely healed.  Chronic pain due to frequent recurrence of symptoms.  Prolonged healing,  especially if activity is resumed too soon (before complete recovery). TREATMENT  Treatment initially involves the use of ice and medication to help reduce pain and inflammation. It is also important to perform strengthening and stretching exercises and modify activities that worsen symptoms so the injury does not get worse. These exercises may be performed at home or with a therapist. For patients who experience severe symptoms, a soft, padded collar may be recommended to be worn around the neck.  Improving your posture may help reduce symptoms. Posture improvement includes pulling your chin and abdomen in while sitting or standing. If you are sitting, sit in a firm chair with your buttocks against the back of the chair. While sleeping, try replacing your pillow with a small towel rolled to 2 inches in diameter, or use a cervical pillow or soft cervical collar. Poor sleeping positions delay healing.  For patients with nerve root damage, which causes numbness or weakness, the use of a cervical traction apparatus may be recommended. Surgery is rarely necessary for these injuries. However, cervical strain and sprains that are present at birth (congenital) may require surgery. MEDICATION   If pain medication is necessary, nonsteroidal anti-inflammatory medications, such as aspirin and ibuprofen, or other minor pain relievers, such as acetaminophen, are often recommended.  Do not take pain medication for 7 days before surgery.  Prescription pain relievers may be given if deemed necessary by your caregiver. Use only as directed and only as much as you need. HEAT AND COLD:   Cold treatment (icing) relieves pain and reduces inflammation. Cold treatment should be applied for 10 to 15 minutes every 2 to 3 hours for inflammation and pain and immediately after any activity that aggravates your  HEAT AND COLD:   · Cold treatment (icing) relieves pain and reduces inflammation. Cold treatment should be applied for 10 to 15 minutes every 2 to 3 hours for inflammation and pain and immediately after any activity that aggravates your symptoms. Use ice packs or an ice massage.  · Heat treatment may be used prior to performing the stretching and  strengthening activities prescribed by your caregiver, physical therapist, or athletic trainer. Use a heat pack or a warm soak.  SEEK MEDICAL CARE IF:   · Symptoms get worse or do not improve in 2 weeks despite treatment.  · New, unexplained symptoms develop (drugs used in treatment may produce side effects).  EXERCISES  RANGE OF MOTION (ROM) AND STRETCHING EXERCISES - Cervical Strain and Sprain  These exercises may help you when beginning to rehabilitate your injury. In order to successfully resolve your symptoms, you must improve your posture. These exercises are designed to help reduce the forward-head and rounded-shoulder posture which contributes to this condition. Your symptoms may resolve with or without further involvement from your physician, physical therapist or athletic trainer. While completing these exercises, remember:   · Restoring tissue flexibility helps normal motion to return to the joints. This allows healthier, less painful movement and activity.  · An effective stretch should be held for at least 20 seconds, although you may need to begin with shorter hold times for comfort.  · A stretch should never be painful. You should only feel a gentle lengthening or release in the stretched tissue.  STRETCH- Axial Extensors  · Lie on your back on the floor. You may bend your knees for comfort. Place a rolled-up hand towel or dish towel, about 2 inches in diameter, under the part of your head that makes contact with the floor.  · Gently tuck your chin, as if trying to make a "double chin," until you feel a gentle stretch at the base of your head.  · Hold __________ seconds.  Repeat __________ times. Complete this exercise __________ times per day.   STRETCH - Axial Extension   · Stand or sit on a firm surface. Assume a good posture: chest up, shoulders drawn back, abdominal muscles slightly tense, knees unlocked (if standing) and feet hip width apart.  · Slowly retract your chin so your head slides back  and your chin slightly lowers. Continue to look straight ahead.  · You should feel a gentle stretch in the back of your head. Be certain not to feel an aggressive stretch since this can cause headaches later.  · Hold for __________ seconds.  Repeat __________ times. Complete this exercise __________ times per day.  STRETCH - Cervical Side Bend   · Stand or sit on a firm surface. Assume a good posture: chest up, shoulders drawn back, abdominal muscles slightly tense, knees unlocked (if standing) and feet hip width apart.  · Without letting your nose or shoulders move, slowly tip your right / left ear to your shoulder until your feel a gentle stretch in the muscles on the opposite side of your neck.  · Hold __________ seconds.  Repeat __________ times. Complete this exercise __________ times per day.  STRETCH - Cervical Rotators   · Stand or sit on a firm surface. Assume a good posture: chest up, shoulders drawn back, abdominal muscles slightly tense, knees unlocked (if standing) and feet hip width apart.  · Keeping your eyes level with the ground, slowly turn your head until you feel a gentle stretch along   the back and opposite side of your neck.  · Hold __________ seconds.  Repeat __________ times. Complete this exercise __________ times per day.  RANGE OF MOTION - Neck Circles   · Stand or sit on a firm surface. Assume a good posture: chest up, shoulders drawn back, abdominal muscles slightly tense, knees unlocked (if standing) and feet hip width apart.  · Gently roll your head down and around from the back of one shoulder to the back of the other. The motion should never be forced or painful.  · Repeat the motion 10-20 times, or until you feel the neck muscles relax and loosen.  Repeat __________ times. Complete the exercise __________ times per day.  STRENGTHENING EXERCISES - Cervical Strain and Sprain  These exercises may help you when beginning to rehabilitate your injury. They may resolve your symptoms with or  without further involvement from your physician, physical therapist, or athletic trainer. While completing these exercises, remember:   · Muscles can gain both the endurance and the strength needed for everyday activities through controlled exercises.  · Complete these exercises as instructed by your physician, physical therapist, or athletic trainer. Progress the resistance and repetitions only as guided.  · You may experience muscle soreness or fatigue, but the pain or discomfort you are trying to eliminate should never worsen during these exercises. If this pain does worsen, stop and make certain you are following the directions exactly. If the pain is still present after adjustments, discontinue the exercise until you can discuss the trouble with your clinician.  STRENGTH - Cervical Flexors, Isometric  · Face a wall, standing about 6 inches away. Place a small pillow, a ball about 6-8 inches in diameter, or a folded towel between your forehead and the wall.  · Slightly tuck your chin and gently push your forehead into the soft object. Push only with mild to moderate intensity, building up tension gradually. Keep your jaw and forehead relaxed.  · Hold 10 to 20 seconds. Keep your breathing relaxed.  · Release the tension slowly. Relax your neck muscles completely before you start the next repetition.  Repeat __________ times. Complete this exercise __________ times per day.  STRENGTH- Cervical Lateral Flexors, Isometric   · Stand about 6 inches away from a wall. Place a small pillow, a ball about 6-8 inches in diameter, or a folded towel between the side of your head and the wall.  · Slightly tuck your chin and gently tilt your head into the soft object. Push only with mild to moderate intensity, building up tension gradually. Keep your jaw and forehead relaxed.  · Hold 10 to 20 seconds. Keep your breathing relaxed.  · Release the tension slowly. Relax your neck muscles completely before you start the next  repetition.  Repeat __________ times. Complete this exercise __________ times per day.  STRENGTH - Cervical Extensors, Isometric   · Stand about 6 inches away from a wall. Place a small pillow, a ball about 6-8 inches in diameter, or a folded towel between the back of your head and the wall.  · Slightly tuck your chin and gently tilt your head back into the soft object. Push only with mild to moderate intensity, building up tension gradually. Keep your jaw and forehead relaxed.  · Hold 10 to 20 seconds. Keep your breathing relaxed.  · Release the tension slowly. Relax your neck muscles completely before you start the next repetition.  Repeat __________ times. Complete this exercise __________ times per day.    All of your joints have less wear and tear when properly supported by a spine with good posture. This means you will experience a healthier, less painful body.  Correct posture must be practiced with all of your activities, especially prolonged sitting and standing. Correct posture is as important when doing repetitive low-stress activities (typing) as it is when doing a single heavy-load activity (lifting). PROLONGED STANDING WHILE SLIGHTLY LEANING FORWARD When completing a task that requires you to lean forward while standing in one  place for a long time, place either foot up on a stationary 2- to 4-inch high object to help maintain the best posture. When both feet are on the ground, the low back tends to lose its slight inward curve. If this curve flattens (or becomes too large), then the back and your other joints will experience too much stress, fatigue more quickly, and can cause pain.  RESTING POSITIONS Consider which positions are most painful for you when choosing a resting position. If you have pain with flexion-based activities (sitting, bending, stooping, squatting), choose a position that allows you to rest in a less flexed posture. You would want to avoid curling into a fetal position on your side. If your pain worsens with extension-based activities (prolonged standing, working overhead), avoid resting in an extended position such as sleeping on your stomach. Most people will find more comfort when they rest with their spine in a more neutral position, neither too rounded nor too arched. Lying on a non-sagging bed on your side with a pillow between your knees, or on your back with a pillow under your knees will often provide some relief. Keep in mind, being in any one position for a prolonged period of time, no matter how correct your posture, can still lead to stiffness. WALKING Walk with an upright posture. Your ears, shoulders, and hips should all line up. OFFICE WORK When working at a desk, create an environment that supports good, upright posture. Without extra support, muscles fatigue and lead to excessive strain on joints and other tissues. CHAIR:  A chair should be able to slide under your desk when your back makes contact with the back of the chair. This allows you to work closely.  The chair's height should allow your eyes to be level with the upper part of your monitor and your hands to be slightly lower than your elbows.  Body position:  Your feet should make contact with the floor. If this is not  possible, use a foot rest.  Keep your ears over your shoulders. This will reduce stress on your neck and low back.   This information is not intended to replace advice given to you by your health care provider. Make sure you discuss any questions you have with your health care provider.   Document Released: 08/28/2005 Document Revised: 09/18/2014 Document Reviewed: 12/10/2008 Elsevier Interactive Patient Education 2016 [place].  Facial or Scalp Contusion A facial or scalp contusion is a deep bruise on the face or head. Injuries to the face and head generally cause a lot of swelling, especially around the eyes. Contusions are the result of an injury that caused bleeding under the skin. The contusion may turn blue, purple, or yellow. Minor injuries will give you a painless contusion, but more severe contusions may stay painful and swollen for a few weeks.  CAUSES  A facial or scalp contusion is caused by a blunt injury or trauma to the face or head area.  SIGNS AND SYMPTOMS  Swelling of the injured area.   Discoloration of the injured area.   Tenderness, soreness, or pain in the injured area.  DIAGNOSIS  The diagnosis can be made by taking a medical history and doing a physical exam. An X-ray exam, CT scan, or MRI may be needed to determine if there are any associated injuries, such as broken bones (fractures). TREATMENT  Often, the best treatment for a facial or scalp contusion is applying cold compresses to the injured area. Over-the-counter medicines may also be recommended for pain control.  HOME CARE INSTRUCTIONS   Only take over-the-counter or prescription medicines as directed by your health care provider.   Apply ice to the injured area.   Put ice in a plastic bag.   Place a towel between your skin and the bag.   Leave the ice on for 20 minutes, 2-3 times a day.  SEEK MEDICAL CARE IF:  You have bite problems.   You have pain with chewing.   You are  concerned about facial defects. SEEK IMMEDIATE MEDICAL CARE IF:  You have severe pain or a headache that is not relieved by medicine.   You have unusual sleepiness, confusion, or personality changes.   You throw up (vomit).   You have a persistent nosebleed.   You have double vision or blurred vision.   You have fluid drainage from your nose or ear.   You have difficulty walking or using your arms or legs.  MAKE SURE YOU:   Understand these instructions.  Will watch your condition.  Will get help right away if you are not doing well or get worse.   This information is not intended to replace advice given to you by your health care provider. Make sure you discuss any questions you have with your health care provider.   Document Released: 10/05/2004 Document Revised: 09/18/2014 Document Reviewed: 04/10/2013 Elsevier Interactive Patient Education [employer].

## 2016-03-21 ENCOUNTER — Other Ambulatory Visit: Payer: Self-pay | Admitting: Obstetrics and Gynecology

## 2016-03-21 DIAGNOSIS — N63 Unspecified lump in unspecified breast: Secondary | ICD-10-CM

## 2016-03-30 ENCOUNTER — Other Ambulatory Visit: Payer: Self-pay | Admitting: Obstetrics and Gynecology

## 2016-03-30 DIAGNOSIS — N6489 Other specified disorders of breast: Secondary | ICD-10-CM

## 2016-04-14 DIAGNOSIS — E032 Hypothyroidism due to medicaments and other exogenous substances: Secondary | ICD-10-CM | POA: Diagnosis not present

## 2016-04-14 DIAGNOSIS — E039 Hypothyroidism, unspecified: Secondary | ICD-10-CM | POA: Diagnosis not present

## 2016-04-14 DIAGNOSIS — E539 Vitamin B deficiency, unspecified: Secondary | ICD-10-CM | POA: Diagnosis not present

## 2016-04-14 DIAGNOSIS — N951 Menopausal and female climacteric states: Secondary | ICD-10-CM | POA: Diagnosis not present

## 2016-04-19 ENCOUNTER — Ambulatory Visit: Payer: 59

## 2016-04-21 ENCOUNTER — Ambulatory Visit: Payer: 59

## 2016-04-28 ENCOUNTER — Ambulatory Visit
Admission: RE | Admit: 2016-04-28 | Discharge: 2016-04-28 | Disposition: A | Discharge status: Home / Self Care | Payer: 59 | Source: Ambulatory Visit | Attending: Obstetrics and Gynecology | Admitting: Obstetrics and Gynecology

## 2016-04-28 DIAGNOSIS — F3341 Major depressive disorder, recurrent, in partial remission: Secondary | ICD-10-CM | POA: Diagnosis not present

## 2016-04-28 DIAGNOSIS — R928 Other abnormal and inconclusive findings on diagnostic imaging of breast: Secondary | ICD-10-CM | POA: Diagnosis not present

## 2016-04-28 DIAGNOSIS — N6489 Other specified disorders of breast: Secondary | ICD-10-CM | POA: Diagnosis not present

## 2016-04-28 DIAGNOSIS — F5105 Insomnia due to other mental disorder: Secondary | ICD-10-CM | POA: Diagnosis not present

## 2016-05-01 DIAGNOSIS — D759 Disease of blood and blood-forming organs, unspecified: Secondary | ICD-10-CM | POA: Diagnosis not present

## 2016-05-01 DIAGNOSIS — E032 Hypothyroidism due to medicaments and other exogenous substances: Secondary | ICD-10-CM | POA: Diagnosis not present

## 2016-05-01 DIAGNOSIS — R739 Hyperglycemia, unspecified: Secondary | ICD-10-CM | POA: Diagnosis not present

## 2016-05-05 DIAGNOSIS — D759 Disease of blood and blood-forming organs, unspecified: Secondary | ICD-10-CM | POA: Diagnosis not present

## 2016-05-05 DIAGNOSIS — E039 Hypothyroidism, unspecified: Secondary | ICD-10-CM | POA: Diagnosis not present

## 2016-05-05 DIAGNOSIS — R7889 Finding of other specified substances, not normally found in blood: Secondary | ICD-10-CM | POA: Diagnosis not present

## 2016-05-12 DIAGNOSIS — E032 Hypothyroidism due to medicaments and other exogenous substances: Secondary | ICD-10-CM | POA: Diagnosis not present

## 2016-05-19 DIAGNOSIS — E039 Hypothyroidism, unspecified: Secondary | ICD-10-CM | POA: Diagnosis not present

## 2016-05-19 DIAGNOSIS — E049 Nontoxic goiter, unspecified: Secondary | ICD-10-CM | POA: Diagnosis not present

## 2016-06-09 DIAGNOSIS — Z23 Encounter for immunization: Secondary | ICD-10-CM | POA: Diagnosis not present

## 2016-06-12 DIAGNOSIS — E049 Nontoxic goiter, unspecified: Secondary | ICD-10-CM | POA: Diagnosis not present

## 2016-06-12 DIAGNOSIS — N6029 Fibroadenosis of unspecified breast: Secondary | ICD-10-CM | POA: Diagnosis not present

## 2016-06-12 DIAGNOSIS — K219 Gastro-esophageal reflux disease without esophagitis: Secondary | ICD-10-CM | POA: Diagnosis not present

## 2016-06-12 DIAGNOSIS — D759 Disease of blood and blood-forming organs, unspecified: Secondary | ICD-10-CM | POA: Diagnosis not present

## 2016-06-12 DIAGNOSIS — E032 Hypothyroidism due to medicaments and other exogenous substances: Secondary | ICD-10-CM | POA: Diagnosis not present

## 2016-06-12 LAB — TSH: TSH: 8.2 u[IU]/mL — AB (ref 0.41–5.90)

## 2016-07-03 ENCOUNTER — Encounter: Payer: Self-pay | Admitting: *Deleted

## 2016-07-03 ENCOUNTER — Ambulatory Visit
Admission: EM | Admit: 2016-07-03 | Discharge: 2016-07-03 | Disposition: A | Discharge status: Home / Self Care | Payer: 59 | Attending: Family Medicine | Admitting: Family Medicine

## 2016-07-03 ENCOUNTER — Other Ambulatory Visit: Payer: Self-pay

## 2016-07-03 ENCOUNTER — Ambulatory Visit (INDEPENDENT_AMBULATORY_CARE_PROVIDER_SITE_OTHER)
Admit: 2016-07-03 | Discharge: 2016-07-03 | Disposition: A | Discharge status: Home / Self Care | Payer: 59 | Attending: Internal Medicine | Admitting: Internal Medicine

## 2016-07-03 DIAGNOSIS — R1011 Right upper quadrant pain: Secondary | ICD-10-CM | POA: Diagnosis not present

## 2016-07-03 DIAGNOSIS — N3010 Interstitial cystitis (chronic) without hematuria: Secondary | ICD-10-CM | POA: Insufficient documentation

## 2016-07-03 DIAGNOSIS — K760 Fatty (change of) liver, not elsewhere classified: Secondary | ICD-10-CM | POA: Diagnosis not present

## 2016-07-03 DIAGNOSIS — R102 Pelvic and perineal pain: Secondary | ICD-10-CM | POA: Insufficient documentation

## 2016-07-03 LAB — CBC WITH DIFFERENTIAL/PLATELET
Basophils Absolute: 0 10*3/uL (ref 0–0.1)
Basophils Relative: 0 %
Eosinophils Absolute: 0.3 10*3/uL (ref 0–0.7)
Eosinophils Relative: 4 %
HCT: 41.5 % (ref 35.0–47.0)
Hemoglobin: 13.9 g/dL (ref 12.0–16.0)
Lymphocytes Relative: 38 %
Lymphs Abs: 3 10*3/uL (ref 1.0–3.6)
MCH: 29.5 pg (ref 26.0–34.0)
MCHC: 33.6 g/dL (ref 32.0–36.0)
MCV: 87.8 fL (ref 80.0–100.0)
Monocytes Absolute: 0.6 10*3/uL (ref 0.2–0.9)
Monocytes Relative: 8 %
Neutro Abs: 3.9 10*3/uL (ref 1.4–6.5)
Neutrophils Relative %: 50 %
Platelets: 377 10*3/uL (ref 150–440)
RBC: 4.73 MIL/uL (ref 3.80–5.20)
RDW: 13.6 % (ref 11.5–14.5)
WBC: 7.8 10*3/uL (ref 3.6–11.0)

## 2016-07-03 LAB — COMPREHENSIVE METABOLIC PANEL
ALT: 20 U/L (ref 14–54)
AST: 21 U/L (ref 15–41)
Albumin: 4.3 g/dL (ref 3.5–5.0)
Alkaline Phosphatase: 107 U/L (ref 38–126)
Anion gap: 9 (ref 5–15)
BUN: 9 mg/dL (ref 6–20)
CO2: 24 mmol/L (ref 22–32)
Calcium: 9.2 mg/dL (ref 8.9–10.3)
Chloride: 103 mmol/L (ref 101–111)
Creatinine, Ser: 0.69 mg/dL (ref 0.44–1.00)
GFR calc Af Amer: >60 mL/min (ref >60)
GFR calc non Af Amer: >60 mL/min (ref >60)
Glucose, Bld: 111 mg/dL — ABNORMAL HIGH (ref 65–99)
Potassium: 4.2 mmol/L (ref 3.5–5.1)
Sodium: 136 mmol/L (ref 135–145)
Total Bilirubin: 0.8 mg/dL (ref 0.3–1.2)
Total Protein: 8 g/dL (ref 6.5–8.1)

## 2016-07-03 LAB — URINALYSIS COMPLETE WITH MICROSCOPIC (ARMC ONLY)
Bilirubin Urine: NEGATIVE
Glucose, UA: NEGATIVE mg/dL
Hgb urine dipstick: NEGATIVE
Ketones, ur: NEGATIVE mg/dL
Leukocytes, UA: NEGATIVE
Nitrite: NEGATIVE
Protein, ur: NEGATIVE mg/dL
RBC / HPF: NONE SEEN RBC/hpf (ref 0–5)
Specific Gravity, Urine: 1.01 (ref 1.005–1.030)
pH: 5.5 (ref 5.0–8.0)

## 2016-07-03 LAB — LIPASE, BLOOD: Lipase: 30 U/L (ref 11–51)

## 2016-07-03 LAB — PREGNANCY, URINE: Preg Test, Ur: NEGATIVE

## 2016-07-03 MED ORDER — ONDANSETRON 8 MG PO TBDP
8.0000 mg | ORAL_TABLET | Freq: Once | ORAL | Status: AC
  Administered 2016-07-03: 8 mg via ORAL

## 2016-07-03 MED ORDER — ONDANSETRON 4 MG PO TBDP: 4.0000 mg | ORAL_TABLET | Freq: Three times a day (TID) | ORAL | 0 refills | Status: DC | PRN

## 2016-07-03 MED ORDER — HYDROCODONE-ACETAMINOPHEN 5-325 MG PO TABS: 1.0000 | ORAL_TABLET | Freq: Three times a day (TID) | ORAL | 0 refills | Status: DC | PRN

## 2016-07-03 MED ORDER — OXYCODONE-ACETAMINOPHEN 5-325 MG PO TABS: 1.0000 | ORAL_TABLET | Freq: Three times a day (TID) | ORAL | 0 refills | Status: DC | PRN

## 2016-07-03 NOTE — ED Notes (Signed)
[place] scheduled for [place] location today.

## 2016-07-03 NOTE — ED Triage Notes (Signed)
RUQ abd pain x1 week. Initially pain began after swimming laps but has become persistent. Nausea and chills began this weekend.

## 2016-07-03 NOTE — ED Provider Notes (Signed)
[facility] ____________________________________________  Time seen: Approximately 9:07 AM  I have reviewed the triage vital signs and the nursing notes.   HISTORY  Chief Complaint Abdominal Pain; Flank Pain; Chills; and Fever   HPI Hailey [name] is a 40 y.o. female presenting for the complaints of right upper quadrant abdominal pain. Patient reports she has had some intermittent pain in the same area over the last week with intermittent nausea. Patient reports in the last 2 days she has had persistent right upper quadrant pain. Patient reports she initially thought she may have pulled a muscle as she had been swimming recently, but reports the pain is a constant pain even at rest. Denies vomiting or diarrhea. Denies any increase of belching or gas feelings. Denies food triggers or any known triggers. States pain does somewhat wrapped around her right back. Denies fall or trauma. Denies history of similar in the past. Reports some intermittent chills.  Denies vomiting, diarrhea, constipation, known fevers, chest pain, shortness of breath, dizziness, weakness, recent antibiotic use or recent sickness. Denies previous abdominal surgeries. Reports history of interstitial cystitis. Denies chance of pregnancy.  Rusty Aus, MD: PCP     Past Medical History:  Diagnosis Date  . Allergic state   . Arthritis   . Asthma   . Cervical disc disease   . Depression   . GERD (gastroesophageal reflux disease)   . Glucose intolerance (impaired glucose tolerance)   . H/O colonoscopy   . HPV (human papilloma virus) infection   . Hypothyroidism   . Interstitial cystitis   . LGSIL (low grade squamous intraepithelial dysplasia)   . Paresthesia     Patient Active Problem List   Diagnosis Date Noted  . Interstitial cystitis 07/03/2016  . Pelvic pain in female 07/03/2016  . 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 Morbid obesity with BMI of 40.0-44.9, adult ([place]) 02/14/2016  .  Polyarthralgia 02/14/2016  . GERD (gastroesophageal reflux disease) 02/10/2014  . Hypothyroid 01/30/2014    Past Surgical History:  Procedure Laterality Date  . BREAST BIOPSY Right 2017   NEG  . COLONOSCOPY WITH PROPOFOL N/A 10/11/2015   Procedure: COLONOSCOPY WITH PROPOFOL;  Surgeon: Lollie Sails, MD;  Location: [facility] ENDOSCOPY;  Service: Endoscopy;  Laterality: N/A;  . ESOPHAGOGASTRODUODENOSCOPY (EGD) WITH PROPOFOL N/A 10/11/2015   Procedure: ESOPHAGOGASTRODUODENOSCOPY (EGD) WITH PROPOFOL;  Surgeon: Lollie Sails, MD;  Location: [facility] ENDOSCOPY;  Service: Endoscopy;  Laterality: N/A;  . TONSILLECTOMY    . TOTAL THYROIDECTOMY  2005    Current Outpatient Rx  . Order #: [id number] Class: Historical Med  . Order #: [id number] Class: Historical Med  . Order #: [id number] Class: Historical Med  . Order #: [id number] Class: Historical Med  . Order #: [id number] Class: Print  . Order #: [id number] Class: Historical Med  . Order #: [id number] Class: Historical Med  . Order #: [id number] Class: Historical Med  . Order #: [id number] Class: Print  . Order #: [id number] Class: Historical Med  . Order #: [id number] Class: Historical Med  . Order #: [id number] Class: Historical Med  . Order #: [id number] Class: Normal  . Order #: [id number] Class: Print  . Order #: [id number] Class: Historical Med  . Order #: [id number] Class: Historical Med  . Order #: [id number] Class: Historical Med  . Order #: [id number] Class: Historical Med    No current facility-administered medications for this encounter.   Current Outpatient Prescriptions:  .  drospirenone-ethinyl estradiol (YAZ) 3-0.02 MG tablet, Take 1 tablet by mouth daily., Disp: , Rfl:  .  levothyroxine (SYNTHROID, LEVOTHROID) 125 MCG  tablet, Take 125 mcg by mouth daily before breakfast., Disp: , Rfl:  .  levothyroxine (SYNTHROID, LEVOTHROID) 88 MCG tablet, Take 88 mcg by mouth daily before breakfast., Disp: , Rfl:  .  omeprazole (PRILOSEC) 40 MG capsule, Take 40 mg by  mouth daily., Disp: , Rfl:  .  cyclobenzaprine (FLEXERIL) 10 MG tablet, Take 1 tablet (10 mg total) by mouth 3 (three) times daily as needed for muscle spasms., Disp: 30 tablet, Rfl: 0 .  DULoxetine (CYMBALTA) 60 MG capsule, Take 60 mg by mouth daily., Disp: , Rfl:  .  ibuprofen (ADVIL,MOTRIN) 200 MG tablet, Take 200 mg by mouth every 6 (six) hours as needed., Disp: , Rfl:  .  ibuprofen (ADVIL,MOTRIN) 200 MG tablet, Take by mouth., Disp: , Rfl:  .  ibuprofen (ADVIL,MOTRIN) 800 MG tablet, Take 1 tablet (800 mg total) by mouth every 8 (eight) hours as needed., Disp: 30 tablet, Rfl: 0 .  levothyroxine (SYNTHROID, LEVOTHROID) 88 MCG tablet, Take by mouth., Disp: , Rfl:  .  liothyronine (CYTOMEL) 25 MCG tablet, Take 25 mcg by mouth daily., Disp: , Rfl:  .  liothyronine (CYTOMEL) 25 MCG tablet, TAKE 1 TABLET BY MOUTH ONCE DAILY, Disp: , Rfl:  .  ondansetron (ZOFRAN ODT) 4 MG disintegrating tablet, Take 1 tablet (4 mg total) by mouth every 8 (eight) hours as needed for nausea or vomiting., Disp: 15 tablet, Rfl: 0 .  oxyCODONE-acetaminophen (ROXICET) 5-325 MG tablet, Take 1 tablet by mouth every 8 (eight) hours as needed for moderate pain or severe pain (Do not drive or operate heavy machinery while taking as can cause drowsiness.)., Disp: 9 tablet, Rfl: 0 .  pantoprazole (PROTONIX) 40 MG tablet, Take by mouth., Disp: , Rfl:  .  sertraline (ZOLOFT) 25 MG tablet, Take by mouth., Disp: , Rfl:  .  traZODone (DESYREL) 50 MG tablet, Take 50 mg by mouth at bedtime., Disp: , Rfl:  .  traZODone (DESYREL) 50 MG tablet, , Disp: , Rfl:   Allergies Lodine [etodolac]; Levofloxacin; Sulfur; Codeine; Escitalopram; Levaquin [levofloxacin in d5w]; Quetiapine; Seroquel [quetiapine fumarate]; Sulfa antibiotics; Zanaflex [tizanidine hcl]; and Tizanidine  Family History  Problem Relation Age of Onset  . Breast cancer Neg Hx     Social History Social History  Substance Use Topics  . Smoking status: Former Research scientist (life sciences)  .  Smokeless tobacco: Never Used  . Alcohol use Yes    Review of Systems Constitutional: No fever/chills Eyes: No visual changes. ENT: No sore throat. Cardiovascular: Denies chest pain. Respiratory: Denies shortness of breath. Gastrointestinal: As above. no vomiting.  No diarrhea.  No constipation. Genitourinary: Negative for dysuria. Musculoskeletal: Negative for back pain. Skin: Negative for rash. Neurological: Negative for headaches, focal weakness or numbness.  10-point ROS otherwise negative.  ____________________________________________   PHYSICAL EXAM:  VITAL SIGNS: ED Triage Vitals  Enc Vitals Group     BP 07/03/16 0816 123/80     Pulse Rate 07/03/16 0816 66     Resp 07/03/16 0816 16     Temp 07/03/16 0816 97.7 F (36.5 C)     Temp Source 07/03/16 0816 Oral     SpO2 07/03/16 0816 100 %     Weight 07/03/16 0818 220 lb (99.8 kg)     Height 07/03/16 0818 5\' 5"  (1.651 m)     Head Circumference --      Peak Flow --      Pain Score 07/03/16 0822 9     Pain Loc --  Pain Edu? --      Excl. in [place]? --     Constitutional: Alert and oriented. Well appearing and in no acute distress. Eyes: Conjunctivae are normal. PERRL. EOMI. ENT      Head: Normocephalic and atraumatic.      Nose: No congestion/rhinnorhea.      Mouth/Throat: Mucous membranes are moist. Neck: No stridor. Supple without meningismus.  Hematological/Lymphatic/Immunilogical: No cervical lymphadenopathy. Cardiovascular: Normal rate, regular rhythm. Grossly normal heart sounds.  Good peripheral circulation. Respiratory: Normal respiratory effort without tachypnea nor retractions. Breath sounds are clear and equal bilaterally. No wheezes/rales/rhonchi.. Gastrointestinal: Moderate tenderness to right upper quadrant, positive Murphy's sign, abdomen otherwise soft and nontender.. Normal Bowel sounds. No CVA tenderness. Musculoskeletal:  Ambulatory with steady gait. No midline cervical, thoracic or lumbar  tenderness to palpation.  Neurologic:  Normal speech and language. No gross focal neurologic deficits are appreciated. Speech is normal. No gait instability.  Skin:  Skin is warm, dry and intact. No rash noted. Psychiatric: Mood and affect are normal. Speech and behavior are normal. Patient exhibits appropriate insight and judgment   ___________________________________________   LABS (all labs ordered are listed, but only abnormal results are displayed)  Labs Reviewed  URINALYSIS COMPLETEWITH MICROSCOPIC ([facility] ONLY) - Abnormal; Notable for the following:       Result Value   Color, Urine STRAW (*)    Bacteria, UA RARE (*)    Squamous Epithelial / LPF 0-5 (*)    All other components within normal limits  COMPREHENSIVE METABOLIC PANEL - Abnormal; Notable for the following:    Glucose, Bld 111 (*)    All other components within normal limits  PREGNANCY, URINE  CBC WITH DIFFERENTIAL/PLATELET  LIPASE, BLOOD   ____________________________________________  RADIOLOGY  US Abdomen Limited Ruq  Result Date: 07/03/2016 CLINICAL DATA:  Right upper quadrant pain EXAM: US ABDOMEN LIMITED - RIGHT UPPER QUADRANT COMPARISON:  12/20/2015 FINDINGS: Gallbladder: No stones or wall thickening. Negative sonographic Murphy's. Patient was mildly tender over the gallbladder during the study. Common bile duct: Diameter: Normal caliber, 3 mm Liver: Increased heterogeneous echotexture throughout the liver compatible with fatty infiltration. No focal abnormality or biliary duct dilatation. IMPRESSION: Mild tenderness over the gallbladder during the study without true sonographic Murphy's sign. No stones or wall thickening. Fatty infiltration of the liver. Electronically Signed   By: Rolm Baptise M.D.   On: 07/03/2016 11:05   ____________________________________________   PROCEDURES Procedures    INITIAL IMPRESSION / ASSESSMENT AND PLAN / ED COURSE  Pertinent labs & imaging results that were available  during my care of the patient were reviewed by me and considered in my medical decision making (see chart for details).  Overall well-appearing patient. No acute distress. Presents for complaints of right upper quadrant abdominal pain. Gradual onset more persistent in the last 2 days described as moderate. Abdomen otherwise soft and nontender. Discussed in detail with patient, suspect cholecystitis of all issues. However ultrasound returned no stones or wall thickening and fatty infiltration of the liver. Labs unremarkable. Discussed in detail with patient further evaluation, by possible CT scan to determine the etiology of abdominal pain. However in discussing with patient, patient reports she has had a history of multiple CTs in the past and discussed deferring use of radiation at this time. Nurses called and were able to obtain follow-up appointment with surgery tomorrow to evaluate pain. Discussed in detail with patient strict follow-up and return parameters including pain worsens, fevers, inability to eat or drink or  any other concerning signs procedure to emergency room for further evaluation. Rx for Zofran and Percocet given. Patient over she is taking Percocet while in the past and tolerated it. Encouraged rest and supportive care.Discussed indication, risks and benefits of medications with patient.  Discussed follow up with Primary care physician this week. Discussed follow up and return parameters including no resolution or any worsening concerns. Patient verbalized understanding and agreed to plan.   ____________________________________________   FINAL CLINICAL IMPRESSION(S) / ED DIAGNOSES  Final diagnoses:  RUQ pain  RUQ abdominal pain     Discharge Medication List as of 07/03/2016 11:27 AM    START taking these medications   Details  ondansetron (ZOFRAN ODT) 4 MG disintegrating tablet Take 1 tablet (4 mg total) by mouth every 8 (eight) hours as needed for nausea or vomiting.,  Starting Mon 07/03/2016, Normal    oxyCODONE-acetaminophen (ROXICET) 5-325 MG tablet Take 1 tablet by mouth every 8 (eight) hours as needed for moderate pain or severe pain (Do not drive or operate heavy machinery while taking as can cause drowsiness.)., Starting Mon 07/03/2016, Print        Note: This dictation was prepared with Dragon dictation along with smaller phrase technology. Any transcriptional errors that result from this process are unintentional.    Clinical Course      Marylene Land, NP 07/03/16 1657

## 2016-07-03 NOTE — Discharge Instructions (Signed)
Take medication as prescribed. Rest. Drink plenty of fluids.   Follow up tomorrow as scheduled.   Follow up with your primary care physician this week. Return to Urgent care or ER for new or worsening concerns.

## 2016-07-04 ENCOUNTER — Ambulatory Visit (INDEPENDENT_AMBULATORY_CARE_PROVIDER_SITE_OTHER): Payer: 59 | Admitting: Surgery

## 2016-07-04 ENCOUNTER — Encounter: Payer: Self-pay | Admitting: Surgery

## 2016-07-04 VITALS — BP 135/78 | HR 72 | Temp 98.1°F | Ht 65.0 in | Wt 231.0 lb

## 2016-07-04 DIAGNOSIS — K8050 Calculus of bile duct without cholangitis or cholecystitis without obstruction: Secondary | ICD-10-CM | POA: Insufficient documentation

## 2016-07-04 MED ORDER — KETOROLAC TROMETHAMINE 10 MG PO TABS: 10.0000 mg | ORAL_TABLET | Freq: Four times a day (QID) | ORAL | 0 refills | Status: DC | PRN

## 2016-07-04 NOTE — Patient Instructions (Signed)
Please look at your blue sheet in case you have questions in regards to your surgery.  Please call us if you have any questions or concerns.

## 2016-07-04 NOTE — Progress Notes (Signed)
Subjective:     Patient ID: Hailey [name], female   DOB: March 31, 1976, 40 y.o.   MRN: [id number]  HPI  40 year old female with pain of right upper quadrant intermittent abdominal pain for about 6 days now. Patient states that began last Wednesday when she was having some slight back pain in the just beneath her right shoulder blade. She states at first she felt like she pulled a muscle and the pain gradually him around her right side and began hurting her in her epigastrium and right upper quadrant. Patient states this normally would come on after eating sometimes between 30 minutes to an hour and the pain will last anywhere from an hour to this extent left about 6 hours. She was seen in the emergency department on 10/23 she had normal labs and her ultrasound did show some pain but no gallbladder wall thickening. The patient states that she gets nauseated when she has the pain and this morning is hurting about a 7 out of 10 pain in her minute in her right upper quadrant and wrapping around through to her back. Patient states that she does have increased burping and increased flatulence and has had some diarrhea as well. The patient states that she also does feel slightly bloated. Patient also has had some chills whenever the pain is there. She did eat a biscuit earlier today and the pain started approximately 45 minutes after. At this time she does not have any fevers chills she does have some nausea but no vomiting and she does have the abdominal pain but she has not had any dysuria or lower back pain.  Past Medical History:  Diagnosis Date  . Allergic state   . Arthritis   . Asthma   . Cervical disc disease   . Depression   . GERD (gastroesophageal reflux disease)   . Glucose intolerance (impaired glucose tolerance)   . H/O colonoscopy   . HPV (human papilloma virus) infection   . Hypothyroidism   . Interstitial cystitis   . LGSIL (low grade squamous intraepithelial dysplasia)   . Paresthesia     Past Surgical History:  Procedure Laterality Date  . BREAST BIOPSY Right 2017   NEG  . COLONOSCOPY WITH PROPOFOL N/A 10/11/2015   Procedure: COLONOSCOPY WITH PROPOFOL;  Surgeon: Lollie Sails, MD;  Location: [facility] ENDOSCOPY;  Service: Endoscopy;  Laterality: N/A;  . ESOPHAGOGASTRODUODENOSCOPY (EGD) WITH PROPOFOL N/A 10/11/2015   Procedure: ESOPHAGOGASTRODUODENOSCOPY (EGD) WITH PROPOFOL;  Surgeon: Lollie Sails, MD;  Location: [facility] ENDOSCOPY;  Service: Endoscopy;  Laterality: N/A;  . TONSILLECTOMY    . TOTAL THYROIDECTOMY  2005   Family History  Problem Relation Age of Onset  . Hypertension Mother   . Stroke Mother   . Cancer Mother   . Diabetes Father   . Hypertension Father   . Hypertension Brother   . Hypertension Maternal Grandmother   . Stroke Maternal Grandmother   . Diabetes Maternal Grandmother   . Breast cancer Neg Hx    Social History   Social History  . Marital status: Married    Spouse name: N/A  . Number of children: N/A  . Years of education: N/A   Social History Main Topics  . Smoking status: Former Research scientist (life sciences)  . Smokeless tobacco: Never Used  . Alcohol use Yes  . Drug use: No  . Sexual activity: Not Asked   Other Topics Concern  . None   Social History Narrative  . None    Current Outpatient  Prescriptions:  .  levothyroxine (SYNTHROID, LEVOTHROID) 125 MCG tablet, Take 125 mcg by mouth daily before breakfast., Disp: , Rfl:  .  ondansetron (ZOFRAN ODT) 4 MG disintegrating tablet, Take 1 tablet (4 mg total) by mouth every 8 (eight) hours as needed for nausea or vomiting., Disp: 15 tablet, Rfl: 0 .  oxyCODONE-acetaminophen (ROXICET) 5-325 MG tablet, Take 1 tablet by mouth every 8 (eight) hours as needed for moderate pain or severe pain (Do not drive or operate heavy machinery while taking as can cause drowsiness.)., Disp: 9 tablet, Rfl: 0 .  ketorolac (TORADOL) 10 MG tablet, Take 1 tablet (10 mg total) by mouth every 6 (six) hours as needed for  moderate pain or severe pain., Disp: 30 tablet, Rfl: 0 Allergies  Allergen Reactions  . Lodine [Etodolac] Shortness Of Breath  . Levofloxacin Rash  . Sulfur Rash  . Codeine Other (See Comments)  . Escitalopram Other (See Comments)    Intolerant.  [place] [Levofloxacin In D5w]   . Quetiapine Other (See Comments)    Excessive sedation at 50 mg.  . Seroquel [Quetiapine Fumarate]   . Sulfa Antibiotics   . Zanaflex [Tizanidine Hcl]   . Tizanidine Rash    Review of Systems  Constitutional: Positive for appetite change and chills. Negative for activity change, fatigue, fever and unexpected weight change.  HENT: Negative for congestion and sore throat.   Respiratory: Negative for cough, shortness of breath and wheezing.   Cardiovascular: Negative for chest pain and leg swelling.  Gastrointestinal: Positive for abdominal distention, abdominal pain, diarrhea and nausea. Negative for blood in stool, constipation and vomiting.  Genitourinary: Negative for difficulty urinating, flank pain and hematuria.  Musculoskeletal: Negative for back pain and neck pain.  Skin: Negative for color change and wound.  Neurological: Negative for dizziness and weakness.  Psychiatric/Behavioral: Negative for agitation. The patient is not nervous/anxious.   All other systems reviewed and are negative.      Vitals:   07/04/16 0908  BP: 135/78  Pulse: 72  Temp: 98.1 F (36.7 C)    Objective:   Physical Exam  Constitutional: She is oriented to person, place, and time. She appears well-developed and well-nourished. No distress.  HENT:  Head: Normocephalic and atraumatic.  Right Ear: External ear normal.  Left Ear: External ear normal.  Nose: Nose normal.  Mouth/Throat: Oropharynx is clear and moist.  Eyes: Conjunctivae and EOM are normal. Pupils are equal, round, and reactive to light. No scleral icterus.  Neck: Normal range of motion. Neck supple. No tracheal deviation present.  Cardiovascular:  Normal rate, regular rhythm, normal heart sounds and intact distal pulses.  Exam reveals no gallop and no friction rub.   No murmur heard. Pulmonary/Chest: Effort normal and breath sounds normal. No respiratory distress. She has no wheezes. She has no rales.  Abdominal: Soft. Bowel sounds are normal. She exhibits no distension. There is tenderness. There is no rebound and no guarding.  Soft, obese, epigastric to right upper quadrant tenderness, positive Murphy sign with catching then upon the restaurant taking a deep breath, no CVA tenderness.  Musculoskeletal: Normal range of motion. She exhibits no edema, tenderness or deformity.  Neurological: She is alert and oriented to person, place, and time. No cranial nerve deficit.  Skin: Skin is warm and dry. No rash noted. No erythema. No pallor.  Psychiatric: She has a normal mood and affect. Her behavior is normal. Judgment and thought content normal.  Vitals reviewed.      CBC  Latest Ref Rng & Units 07/03/2016 08/27/2014  WBC 3.6 - 11.0 K/uL 7.8 13.7(H)  Hemoglobin 12.0 - 16.0 g/dL 13.9 13.4  Hematocrit 35.0 - 47.0 % 41.5 40.6  Platelets 150 - 440 K/uL 377 406   CMP Latest Ref Rng & Units 07/03/2016 08/27/2014  Glucose 65 - 99 mg/dL 111(H) 100(H)  BUN 6 - 20 mg/dL 9 8  Creatinine 0.44 - 1.00 mg/dL 0.69 0.69  Sodium 135 - 145 mmol/L 136 138  Potassium 3.5 - 5.1 mmol/L 4.2 3.7  Chloride 101 - 111 mmol/L 103 104  CO2 22 - 32 mmol/L 24 23  Calcium 8.9 - 10.3 mg/dL 9.2 8.8  Total Protein 6.5 - 8.1 g/dL 8.0 -  Total Bilirubin 0.3 - 1.2 mg/dL 0.8 -  Alkaline Phos 38 - 126 U/L 107 -  AST 15 - 41 U/L 21 -  ALT 14 - 54 U/L 20 -   U/S: IMPRESSION: Mild tenderness over the gallbladder during the study without true sonographic Murphy's sign. No stones or wall thickening. Fatty infiltration of the liver.   Assessment:     40 year old with biliary colic    Plan:     I have personally reviewed her past medical history where she does have a  history of hypothyroidism and is status post thyroidectomy, otherwise she has some generalized musculoskeletal issues but no other chronic issues. I have personally reviewed her laboratory values from her ED visit where she has a normal [place] count and normal LFTs and bilirubin as well as lipase. I personally reviewed her ultrasound images which do show a contracted gallbladder with no wall thickening or fluid and no frank stones seen although I believe I do see some sludge. I have also reviewed the radiology reads from this as above.  The patient has classic symptoms for biliary colic with right upper quadrant and epigastric tenderness that moves through to her back around the shoulder blade with nausea increased bloating and flatulence and some diarrhea. Symptoms combined with what I believe is sludge on ultrasound limited to believe that she has true biliary colic. I discussed with her eating a bland diet in the meantime and attempting to stay hydrated as much as possible and to avoid any fatty foods even things with slight amounts of butter or oil in them.  I will check her use ibuprofen and will give her a prescription for Toradol as well to help with the pain.   The risks, benefits, complications, treatment options, and expected outcomes were discussed with the patient. The possibilities of bleeding, recurrent infection, finding a normal gallbladder, perforation of viscus organs, damage to surrounding structures, bile leak, abscess formation, needing a drain placed, the need for additional procedures, reaction to medication, pulmonary aspiration,  failure to diagnose a condition, the possible need to convert to an open procedure, and creating a complication requiring transfusion or operation were discussed with the patient. The patient concurred with the proposed plan. We'll set her up for a laparoscopic cholecystectomy in the week of November 6.

## 2016-07-05 ENCOUNTER — Telehealth: Payer: Self-pay | Admitting: Surgery

## 2016-07-05 ENCOUNTER — Other Ambulatory Visit
Admission: RE | Admit: 2016-07-05 | Discharge: 2016-07-05 | Disposition: A | Discharge status: Home / Self Care | Payer: 59 | Source: Ambulatory Visit | Attending: Surgery | Admitting: Surgery

## 2016-07-05 DIAGNOSIS — R1010 Upper abdominal pain, unspecified: Secondary | ICD-10-CM | POA: Insufficient documentation

## 2016-07-05 DIAGNOSIS — R1011 Right upper quadrant pain: Secondary | ICD-10-CM

## 2016-07-05 LAB — COMPREHENSIVE METABOLIC PANEL
ALT: 21 U/L (ref 14–54)
AST: 22 U/L (ref 15–41)
Albumin: 4 g/dL (ref 3.5–5.0)
Alkaline Phosphatase: 92 U/L (ref 38–126)
Anion gap: 8 (ref 5–15)
BUN: 12 mg/dL (ref 6–20)
CO2: 24 mmol/L (ref 22–32)
Calcium: 9 mg/dL (ref 8.9–10.3)
Chloride: 105 mmol/L (ref 101–111)
Creatinine, Ser: 0.76 mg/dL (ref 0.44–1.00)
GFR calc Af Amer: >60 mL/min (ref >60)
GFR calc non Af Amer: >60 mL/min (ref >60)
Glucose, Bld: 109 mg/dL — ABNORMAL HIGH (ref 65–99)
Potassium: 3.9 mmol/L (ref 3.5–5.1)
Sodium: 137 mmol/L (ref 135–145)
Total Bilirubin: 0.5 mg/dL (ref 0.3–1.2)
Total Protein: 7.3 g/dL (ref 6.5–8.1)

## 2016-07-05 LAB — CBC WITH DIFFERENTIAL/PLATELET
Basophils Absolute: 0 10*3/uL (ref 0–0.1)
Basophils Relative: 0 %
Eosinophils Absolute: 0.3 10*3/uL (ref 0–0.7)
Eosinophils Relative: 3 %
HCT: 36.6 % (ref 35.0–47.0)
Hemoglobin: 12.3 g/dL (ref 12.0–16.0)
Lymphocytes Relative: 34 %
Lymphs Abs: 3.1 10*3/uL (ref 1.0–3.6)
MCH: 29.6 pg (ref 26.0–34.0)
MCHC: 33.7 g/dL (ref 32.0–36.0)
MCV: 87.8 fL (ref 80.0–100.0)
Monocytes Absolute: 0.8 10*3/uL (ref 0.2–0.9)
Monocytes Relative: 8 %
Neutro Abs: 5 10*3/uL (ref 1.4–6.5)
Neutrophils Relative %: 55 %
Platelets: 354 10*3/uL (ref 150–440)
RBC: 4.17 MIL/uL (ref 3.80–5.20)
RDW: 13.4 % (ref 11.5–14.5)
WBC: 9.2 10*3/uL (ref 3.6–11.0)

## 2016-07-05 LAB — LIPASE, BLOOD: Lipase: 34 U/L (ref 11–51)

## 2016-07-05 NOTE — Telephone Encounter (Signed)
Spoke with Dr. Azalee Course regarding this patient. She has ordered at [facility] HIDA scan and labs to be done. HIDA has been scheduled at [facility] [place] at [place] in the am, arrival at 0930am. She will need to remain NPO after midnight and no narcotics 6 hours prior to scan. Called Nuclear Medicine and go the ok for patient to take prescribed Toradol.   Patient will have STAT labs drawn today and then present to [facility] tomorrow. Patient has been made aware of this information and verbalizes understanding of this.

## 2016-07-05 NOTE — Telephone Encounter (Signed)
Pt advised of pre op date/time and sx date. Sx: 07/21/16 with Dr Lenore Manner cholecystectomy.  Pre op: 07/14/16 between 9-1:00pm--Phone.   Patient made aware to call [phone], between 1-3:00pm the day before surgery, to find out what time to arrive.     Patient complains of pain through out the day that is consistent that is moving into the chest. She would like to know if the surgery date could be much sooner. I have advised Amber, RN our nurse and she will discuss this with Dr Azalee Course.

## 2016-07-06 ENCOUNTER — Ambulatory Visit
Admission: RE | Admit: 2016-07-06 | Discharge: 2016-07-06 | Disposition: A | Discharge status: Home / Self Care | Payer: 59 | Source: Ambulatory Visit | Attending: Surgery | Admitting: Surgery

## 2016-07-06 DIAGNOSIS — R1011 Right upper quadrant pain: Secondary | ICD-10-CM | POA: Diagnosis not present

## 2016-07-06 MED ORDER — TECHNETIUM TC 99M MEBROFENIN IV KIT
5.0000 | PACK | Freq: Once | INTRAVENOUS | Status: AC | PRN
  Administered 2016-07-06: 5.17 via INTRAVENOUS

## 2016-07-06 NOTE — Telephone Encounter (Signed)
Received results of HIDA Scan- EF 4%. Spoke with Dr. Azalee Course and Dr. Hampton Abbot.   Dr. Hampton Abbot would like to add patient on to OR scheduled tomorrow. This has been done. Patient will come to Pre-admission testing tomorrow at 12pm for interview then will be taken to Same Day Surgery afterwards for surgery.   NPO after midnight, take synthroid with only a sip of water but no other medications.  Called patient, all information above has been given. She verbalized understanding of this. Directions to PAT were given.

## 2016-07-07 ENCOUNTER — Ambulatory Visit: Payer: 59 | Admitting: Anesthesiology

## 2016-07-07 ENCOUNTER — Encounter: Payer: Self-pay | Admitting: *Deleted

## 2016-07-07 ENCOUNTER — Ambulatory Visit: Payer: Self-pay | Admitting: Urology

## 2016-07-07 ENCOUNTER — Encounter
Admission: RE | Disposition: A | Discharge status: Home / Self Care | Payer: Self-pay | Source: Ambulatory Visit | Attending: Surgery

## 2016-07-07 ENCOUNTER — Ambulatory Visit
Admission: RE | Admit: 2016-07-07 | Discharge: 2016-07-07 | Disposition: A | Discharge status: Home / Self Care | Payer: 59 | Source: Ambulatory Visit | Attending: Surgery | Admitting: Surgery

## 2016-07-07 DIAGNOSIS — Z881 Allergy status to other antibiotic agents status: Secondary | ICD-10-CM | POA: Diagnosis not present

## 2016-07-07 DIAGNOSIS — Z823 Family history of stroke: Secondary | ICD-10-CM | POA: Diagnosis not present

## 2016-07-07 DIAGNOSIS — N3010 Interstitial cystitis (chronic) without hematuria: Secondary | ICD-10-CM | POA: Insufficient documentation

## 2016-07-07 DIAGNOSIS — Z888 Allergy status to other drugs, medicaments and biological substances status: Secondary | ICD-10-CM | POA: Insufficient documentation

## 2016-07-07 DIAGNOSIS — M1990 Unspecified osteoarthritis, unspecified site: Secondary | ICD-10-CM | POA: Diagnosis not present

## 2016-07-07 DIAGNOSIS — Z809 Family history of malignant neoplasm, unspecified: Secondary | ICD-10-CM | POA: Insufficient documentation

## 2016-07-07 DIAGNOSIS — E039 Hypothyroidism, unspecified: Secondary | ICD-10-CM | POA: Diagnosis not present

## 2016-07-07 DIAGNOSIS — J45909 Unspecified asthma, uncomplicated: Secondary | ICD-10-CM | POA: Insufficient documentation

## 2016-07-07 DIAGNOSIS — E669 Obesity, unspecified: Secondary | ICD-10-CM | POA: Insufficient documentation

## 2016-07-07 DIAGNOSIS — Z882 Allergy status to sulfonamides status: Secondary | ICD-10-CM | POA: Insufficient documentation

## 2016-07-07 DIAGNOSIS — K8050 Calculus of bile duct without cholangitis or cholecystitis without obstruction: Secondary | ICD-10-CM | POA: Diagnosis not present

## 2016-07-07 DIAGNOSIS — K219 Gastro-esophageal reflux disease without esophagitis: Secondary | ICD-10-CM | POA: Insufficient documentation

## 2016-07-07 DIAGNOSIS — Z6838 Body mass index (BMI) 38.0-38.9, adult: Secondary | ICD-10-CM | POA: Insufficient documentation

## 2016-07-07 DIAGNOSIS — Z8249 Family history of ischemic heart disease and other diseases of the circulatory system: Secondary | ICD-10-CM | POA: Insufficient documentation

## 2016-07-07 DIAGNOSIS — F329 Major depressive disorder, single episode, unspecified: Secondary | ICD-10-CM | POA: Diagnosis not present

## 2016-07-07 DIAGNOSIS — Z833 Family history of diabetes mellitus: Secondary | ICD-10-CM | POA: Insufficient documentation

## 2016-07-07 DIAGNOSIS — Z885 Allergy status to narcotic agent status: Secondary | ICD-10-CM | POA: Insufficient documentation

## 2016-07-07 DIAGNOSIS — K811 Chronic cholecystitis: Secondary | ICD-10-CM | POA: Insufficient documentation

## 2016-07-07 DIAGNOSIS — Z87891 Personal history of nicotine dependence: Secondary | ICD-10-CM | POA: Insufficient documentation

## 2016-07-07 DIAGNOSIS — K828 Other specified diseases of gallbladder: Secondary | ICD-10-CM | POA: Diagnosis present

## 2016-07-07 DIAGNOSIS — E7439 Other disorders of intestinal carbohydrate absorption: Secondary | ICD-10-CM | POA: Diagnosis not present

## 2016-07-07 HISTORY — PX: CHOLECYSTECTOMY: SHX55

## 2016-07-07 LAB — GLUCOSE, CAPILLARY
Glucose-Capillary: 82 mg/dL (ref 65–99)
Glucose-Capillary: 141 mg/dL — ABNORMAL HIGH (ref 65–99)

## 2016-07-07 LAB — POCT PREGNANCY, URINE: Preg Test, Ur: NEGATIVE

## 2016-07-07 SURGERY — LAPAROSCOPIC CHOLECYSTECTOMY
Anesthesia: General | Wound class: Clean Contaminated

## 2016-07-07 MED ORDER — CHLORHEXIDINE GLUCONATE CLOTH 2 % EX PADS: 6.0000 | MEDICATED_PAD | Freq: Once | CUTANEOUS | Status: DC

## 2016-07-07 MED ORDER — GLYCOPYRROLATE 0.2 MG/ML IJ SOLN
INTRAMUSCULAR | Status: DC | PRN
Start: 2016-07-07 — End: 2016-07-07
  Administered 2016-07-07: .4 mg via INTRAVENOUS

## 2016-07-07 MED ORDER — CEFAZOLIN SODIUM-DEXTROSE 2-4 GM/100ML-% IV SOLN
2.0000 g | INTRAVENOUS | Status: AC
  Administered 2016-07-07: 2 g via INTRAVENOUS

## 2016-07-07 MED ORDER — OXYCODONE HCL 5 MG PO TABS: 5.0000 mg | ORAL_TABLET | Freq: Four times a day (QID) | ORAL | 0 refills | Status: DC | PRN

## 2016-07-07 MED ORDER — OXYCODONE HCL 5 MG PO TABS: 5.0000 mg | ORAL_TABLET | ORAL | 0 refills | Status: DC | PRN

## 2016-07-07 MED ORDER — SODIUM CHLORIDE 0.9 % IJ SOLN
INTRAMUSCULAR | Status: AC
  Filled 2016-07-07: qty 10

## 2016-07-07 MED ORDER — MIDAZOLAM HCL 2 MG/2ML IJ SOLN
INTRAMUSCULAR | Status: DC | PRN
  Administered 2016-07-07: 2 mg via INTRAVENOUS

## 2016-07-07 MED ORDER — SODIUM CHLORIDE 0.9 % IV SOLN
INTRAVENOUS | Status: DC
  Administered 2016-07-07 (×2): via INTRAVENOUS

## 2016-07-07 MED ORDER — ROCURONIUM BROMIDE 100 MG/10ML IV SOLN
INTRAVENOUS | Status: DC | PRN
  Administered 2016-07-07: 50 mg via INTRAVENOUS
  Administered 2016-07-07: 20 mg via INTRAVENOUS

## 2016-07-07 MED ORDER — PROMETHAZINE HCL 25 MG/ML IJ SOLN
INTRAMUSCULAR | Status: AC
  Administered 2016-07-07: 6.25 mg via INTRAVENOUS
  Filled 2016-07-07: qty 1

## 2016-07-07 MED ORDER — FENTANYL CITRATE (PF) 100 MCG/2ML IJ SOLN
INTRAMUSCULAR | Status: DC | PRN
  Administered 2016-07-07 (×5): 50 ug via INTRAVENOUS

## 2016-07-07 MED ORDER — EPHEDRINE SULFATE 50 MG/ML IJ SOLN
INTRAMUSCULAR | Status: DC | PRN
  Administered 2016-07-07: 10 mg via INTRAVENOUS

## 2016-07-07 MED ORDER — FENTANYL CITRATE (PF) 100 MCG/2ML IJ SOLN
25.0000 ug | INTRAMUSCULAR | Status: DC | PRN
  Administered 2016-07-07 (×4): 25 ug via INTRAVENOUS

## 2016-07-07 MED ORDER — ONDANSETRON HCL 4 MG/2ML IJ SOLN
INTRAMUSCULAR | Status: DC | PRN
  Administered 2016-07-07: 4 mg via INTRAVENOUS

## 2016-07-07 MED ORDER — BUPIVACAINE-EPINEPHRINE (PF) 0.25% -1:200000 IJ SOLN
INTRAMUSCULAR | Status: AC
  Filled 2016-07-07: qty 30

## 2016-07-07 MED ORDER — PROMETHAZINE HCL 25 MG/ML IJ SOLN
6.2500 mg | INTRAMUSCULAR | Status: DC | PRN
  Administered 2016-07-07: 6.25 mg via INTRAVENOUS

## 2016-07-07 MED ORDER — ACETAMINOPHEN 10 MG/ML IV SOLN
INTRAVENOUS | Status: DC | PRN
  Administered 2016-07-07: 1000 mg via INTRAVENOUS

## 2016-07-07 MED ORDER — PROPOFOL 10 MG/ML IV BOLUS
INTRAVENOUS | Status: DC | PRN
  Administered 2016-07-07: 200 mg via INTRAVENOUS

## 2016-07-07 MED ORDER — LIDOCAINE HCL (CARDIAC) 20 MG/ML IV SOLN
INTRAVENOUS | Status: DC | PRN
  Administered 2016-07-07: 100 mg via INTRAVENOUS

## 2016-07-07 MED ORDER — OXYCODONE HCL 5 MG/5ML PO SOLN: 5.0000 mg | Freq: Once | ORAL | Status: DC | PRN

## 2016-07-07 MED ORDER — DEXAMETHASONE SODIUM PHOSPHATE 10 MG/ML IJ SOLN
INTRAMUSCULAR | Status: DC | PRN
  Administered 2016-07-07: 10 mg via INTRAVENOUS

## 2016-07-07 MED ORDER — BUPIVACAINE-EPINEPHRINE (PF) 0.25% -1:200000 IJ SOLN
INTRAMUSCULAR | Status: DC | PRN
  Administered 2016-07-07: 30 mL via PERINEURAL

## 2016-07-07 MED ORDER — MEPERIDINE HCL 25 MG/ML IJ SOLN: 6.2500 mg | INTRAMUSCULAR | Status: DC | PRN

## 2016-07-07 MED ORDER — OXYCODONE HCL 5 MG PO TABS: 5.0000 mg | ORAL_TABLET | Freq: Once | ORAL | Status: DC | PRN

## 2016-07-07 MED ORDER — NEOSTIGMINE METHYLSULFATE 10 MG/10ML IV SOLN
INTRAVENOUS | Status: DC | PRN
  Administered 2016-07-07: 3 mg via INTRAVENOUS

## 2016-07-07 MED ORDER — ACETAMINOPHEN 10 MG/ML IV SOLN
INTRAVENOUS | Status: AC
  Filled 2016-07-07: qty 100

## 2016-07-07 MED ORDER — FENTANYL CITRATE (PF) 100 MCG/2ML IJ SOLN
INTRAMUSCULAR | Status: AC
  Administered 2016-07-07: 25 ug via INTRAVENOUS
  Filled 2016-07-07: qty 2

## 2016-07-07 MED ORDER — CEFAZOLIN SODIUM-DEXTROSE 2-4 GM/100ML-% IV SOLN
INTRAVENOUS | Status: AC
  Filled 2016-07-07: qty 100

## 2016-07-07 SURGICAL SUPPLY — 47 items
APPLIER CLIP 5 13 M/L LIGAMAX5 (MISCELLANEOUS) ×2
BLADE SURG 15 STRL LF DISP TIS (BLADE) ×1 IMPLANT
BLADE SURG 15 STRL SS (BLADE) ×1
CANISTER SUCT 1200ML W/VALVE (MISCELLANEOUS) ×2 IMPLANT
CATH CHOLANGI 4FR 420404F (CATHETERS) IMPLANT
CHLORAPREP W/TINT 10.5 ML (MISCELLANEOUS) ×2 IMPLANT
CLIP APPLIE 5 13 M/L LIGAMAX5 (MISCELLANEOUS) ×1 IMPLANT
CONRAY 60ML FOR OR (MISCELLANEOUS) IMPLANT
DERMABOND ADVANCED (GAUZE/BANDAGES/DRESSINGS) ×1
DERMABOND ADVANCED .7 DNX12 (GAUZE/BANDAGES/DRESSINGS) ×1 IMPLANT
DISSECTOR KITTNER STICK (MISCELLANEOUS) ×1 IMPLANT
DISSECTORS/KITTNER STICK (MISCELLANEOUS) ×2
DRAPE C-ARM XRAY 36X54 (DRAPES) IMPLANT
DRAPE SHEET LG 3/4 BI-LAMINATE (DRAPES) ×4 IMPLANT
ELECT REM PT RETURN 9FT ADLT (ELECTROSURGICAL) ×2
ELECTRODE REM PT RTRN 9FT ADLT (ELECTROSURGICAL) ×1 IMPLANT
ENDOPOUCH RETRIEVER 10 (MISCELLANEOUS) ×2 IMPLANT
GLOVE SURG SYN 7.0 (GLOVE) ×2 IMPLANT
GLOVE SURG SYN 7.5  E (GLOVE) ×1
GLOVE SURG SYN 7.5 E (GLOVE) ×1 IMPLANT
GOWN STRL REUS W/ TWL LRG LVL3 (GOWN DISPOSABLE) ×3 IMPLANT
GOWN STRL REUS W/TWL LRG LVL3 (GOWN DISPOSABLE) ×3
IRRIGATION STRYKERFLOW (MISCELLANEOUS) IMPLANT
IRRIGATOR STRYKERFLOW (MISCELLANEOUS)
IV CATH ANGIO 12GX3 LT BLUE (NEEDLE) IMPLANT
IV NS 1000ML (IV SOLUTION)
IV NS 1000ML BAXH (IV SOLUTION) IMPLANT
JACKSON PRATT 10 (INSTRUMENTS) IMPLANT
L-HOOK LAP DISP 36CM (ELECTROSURGICAL) ×2
LABEL OR SOLS (LABEL) ×2 IMPLANT
LHOOK LAP DISP 36CM (ELECTROSURGICAL) ×1 IMPLANT
LIQUID BAND (GAUZE/BANDAGES/DRESSINGS) IMPLANT
NDL HPO THNWL 1X22GA REG BVL (NEEDLE) ×1 IMPLANT
NDL SAFETY 22GX1.5 (NEEDLE) ×2 IMPLANT
NEEDLE SAFETY 22GX1 (NEEDLE) ×1
PACK LAP CHOLECYSTECTOMY (MISCELLANEOUS) ×2 IMPLANT
PENCIL ELECTRO HAND CTR (MISCELLANEOUS) ×2 IMPLANT
SCISSORS METZENBAUM CVD 33 (INSTRUMENTS) ×2 IMPLANT
SLEEVE ADV FIXATION 5X100MM (TROCAR) ×6 IMPLANT
SPONGE EXCIL AMD DRAIN 4X4 6P (MISCELLANEOUS) IMPLANT
SUT MNCRL 4-0 (SUTURE) ×2
SUT MNCRL 4-0 27XMFL (SUTURE) ×2
SUT VICRYL 0 AB UR-6 (SUTURE) ×2 IMPLANT
SUTURE MNCRL 4-0 27XMF (SUTURE) ×2 IMPLANT
TROCAR 130MM GELPORT  DAV (MISCELLANEOUS) ×2 IMPLANT
TROCAR Z-THREAD OPTICAL 5X100M (TROCAR) ×2 IMPLANT
TUBING INSUFFLATOR HI FLOW (MISCELLANEOUS) ×2 IMPLANT

## 2016-07-07 NOTE — Anesthesia Preprocedure Evaluation (Signed)
Anesthesia Evaluation  Patient identified by MRN, date of birth, ID band Patient awake    Reviewed: Allergy & Precautions, NPO status , Patient's Chart, lab work & pertinent test results  History of Anesthesia Complications Negative for: history of anesthetic complications  Airway Mallampati: III  TM Distance: >3 FB Neck ROM: Full    Dental no notable dental hx.    Pulmonary asthma (seasonal) , former smoker,    breath sounds clear to auscultation- rhonchi (-) wheezing      Cardiovascular Exercise Tolerance: Good (-) hypertension(-) CAD and (-) Past MI  Rhythm:Regular Rate:Normal - Systolic murmurs and - Diastolic murmurs    Neuro/Psych Depression negative neurological ROS     GI/Hepatic Neg liver ROS, GERD  ,  Endo/Other  neg diabetesHypothyroidism   Renal/GU negative Renal ROS     Musculoskeletal  (+) Arthritis ,   Abdominal (+) + obese,   Peds  Hematology   Anesthesia Other Findings Past Medical History: No date: Allergic state No date: Arthritis No date: Asthma No date: Cervical disc disease No date: Depression No date: GERD (gastroesophageal reflux disease) No date: Glucose intolerance (impaired glucose toleranc* No date: H/O colonoscopy No date: HPV (human papilloma virus) infection No date: Hypothyroidism No date: Interstitial cystitis No date: LGSIL (low grade squamous intraepithelial dysp* No date: Paresthesia   Reproductive/Obstetrics                             Anesthesia Physical Anesthesia Plan  ASA: II  Anesthesia Plan: General   Post-op Pain Management:    Induction: Intravenous  Airway Management Planned: Oral ETT  Additional Equipment:   Intra-op Plan:   Post-operative Plan: Extubation in OR  Informed Consent: I have reviewed the patients History and Physical, chart, labs and discussed the procedure including the risks, benefits and alternatives for  the proposed anesthesia with the patient or authorized representative who has indicated his/her understanding and acceptance.   Dental advisory given  Plan Discussed with: CRNA and Anesthesiologist  Anesthesia Plan Comments:         Anesthesia Quick Evaluation

## 2016-07-07 NOTE — Interval H&P Note (Signed)
History and Physical Interval Note:  07/07/2016 1:13 PM  Hailey [name]  has presented today for surgery, with the diagnosis of Biliary Colic  The various methods of treatment have been discussed with the patient and family. After consideration of risks, benefits and other options for treatment, the patient has consented to  Procedure(s): LAPAROSCOPIC CHOLECYSTECTOMY (N/A) as a surgical intervention .  The patient's history has been reviewed, patient examined, no change in status, stable for surgery.  I have reviewed the patient's chart and labs.  Questions were answered to the patient's satisfaction.     Shawnell Dykes

## 2016-07-07 NOTE — Discharge Instructions (Signed)

## 2016-07-07 NOTE — Op Note (Signed)
  Procedure Date:  07/07/2016  Pre-operative Diagnosis:  Biliary dyskinesia  Post-operative Diagnosis: Biliary dyskinesia  Procedure:  Laparoscopic cholecystectomy  Surgeon:  Melvyn Neth, MD  Anesthesia:  General endotracheal  Estimated Blood Loss:  20 ml  Specimens:  gallbladder  Complications:  None  Indications for Procedure:  This is a 40 y.o. female who presents with abdominal pain and workup revealing biliary dyskinesia, with a gallbladder EF of 4%.  The benefits, complications, treatment options, and expected outcomes were discussed with the patient. The risks of bleeding, infection, recurrence of symptoms, failure to resolve symptoms, bile duct damage, bile duct leak, retained common bile duct stone, bowel injury, and need for further procedures were all discussed with the patient and she was willing to proceed.  Description of Procedure: The patient was correctly identified in the preoperative area and brought into the operating room.  The patient was placed supine with VTE prophylaxis in place.  Appropriate time-outs were performed.  Anesthesia was induced and the patient was intubated.  Appropriate antibiotics were infused.  The abdomen was prepped and draped in a sterile fashion. An infraumbilical incision was made. A cutdown technique was used to enter the abdominal cavity without injury, and a Hasson trocar was inserted.  Pneumoperitoneum was obtained with appropriate opening pressures.  A 5-mm port was placed in the subxiphoid area and two 5-mm ports were placed in the right upper quadrant under direct visualization.  The gallbladder was identified and found to be intrahepatic, with a large liver.  The fundus was grasped and retracted cephalad.  Adhesions were lysed bluntly and with electrocautery. The infundibulum was grasped and retracted laterally, exposing the peritoneum overlying the gallbladder.  This was incised with electrocautery and extended on either side of  the gallbladder.  The cystic duct and cystic artery were clearly identified and bluntly dissected.  Both were clipped twice proximally and once distally, cutting in between.  The gallbladder was taken from the gallbladder fossa in a retrograde fashion with electrocautery. The gallbladder was placed in an Endocatch bag and brought out via the umbilical incision. The liver bed was inspected and any bleeding was controlled with electrocautery. Two liver capsule tears were cauterized.  The right upper quadrant was then inspected again revealing intact clips, no bleeding, and no ductal injury.  The area was thoroughly irrigated.  The 5 mm ports were removed under direct visualization and the Hasson trocar was removed.  The fascial opening was closed using 0 vicryl suture.  Local anesthetic was infused in all incisions and the incisions were closed with 4-0 Monocryl.  The wounds were cleaned and sealed with DermaBond.  The patient was emerged from anesthesia and extubated and brought to the recovery room for further management.  The patient tolerated the procedure well and all counts were correct at the end of the case.   Melvyn Neth, MD

## 2016-07-07 NOTE — H&P (View-Only) (Signed)
Subjective:     Patient ID: Hailey [name], female   DOB: 06/07/1976, 40 y.o.   MRN: [id number]  HPI  40 year old female with pain of right upper quadrant intermittent abdominal pain for about 6 days now. Patient states that began last Wednesday when she was having some slight back pain in the just beneath her right shoulder blade. She states at first she felt like she pulled a muscle and the pain gradually him around her right side and began hurting her in her epigastrium and right upper quadrant. Patient states this normally would come on after eating sometimes between 30 minutes to an hour and the pain will last anywhere from an hour to this extent left about 6 hours. She was seen in the emergency department on 10/23 she had normal labs and her ultrasound did show some pain but no gallbladder wall thickening. The patient states that she gets nauseated when she has the pain and this morning is hurting about a 7 out of 10 pain in her minute in her right upper quadrant and wrapping around through to her back. Patient states that she does have increased burping and increased flatulence and has had some diarrhea as well. The patient states that she also does feel slightly bloated. Patient also has had some chills whenever the pain is there. She did eat a biscuit earlier today and the pain started approximately 45 minutes after. At this time she does not have any fevers chills she does have some nausea but no vomiting and she does have the abdominal pain but she has not had any dysuria or lower back pain.  Past Medical History:  Diagnosis Date  . Allergic state   . Arthritis   . Asthma   . Cervical disc disease   . Depression   . GERD (gastroesophageal reflux disease)   . Glucose intolerance (impaired glucose tolerance)   . H/O colonoscopy   . HPV (human papilloma virus) infection   . Hypothyroidism   . Interstitial cystitis   . LGSIL (low grade squamous intraepithelial dysplasia)   . Paresthesia     Past Surgical History:  Procedure Laterality Date  . BREAST BIOPSY Right 2017   NEG  . COLONOSCOPY WITH PROPOFOL N/A 10/11/2015   Procedure: COLONOSCOPY WITH PROPOFOL;  Surgeon: Lollie Sails, MD;  Location: [facility] ENDOSCOPY;  Service: Endoscopy;  Laterality: N/A;  . ESOPHAGOGASTRODUODENOSCOPY (EGD) WITH PROPOFOL N/A 10/11/2015   Procedure: ESOPHAGOGASTRODUODENOSCOPY (EGD) WITH PROPOFOL;  Surgeon: Lollie Sails, MD;  Location: [facility] ENDOSCOPY;  Service: Endoscopy;  Laterality: N/A;  . TONSILLECTOMY    . TOTAL THYROIDECTOMY  2005   Family History  Problem Relation Age of Onset  . Hypertension Mother   . Stroke Mother   . Cancer Mother   . Diabetes Father   . Hypertension Father   . Hypertension Brother   . Hypertension Maternal Grandmother   . Stroke Maternal Grandmother   . Diabetes Maternal Grandmother   . Breast cancer Neg Hx    Social History   Social History  . Marital status: Married    Spouse name: N/A  . Number of children: N/A  . Years of education: N/A   Social History Main Topics  . Smoking status: Former Research scientist (life sciences)  . Smokeless tobacco: Never Used  . Alcohol use Yes  . Drug use: No  . Sexual activity: Not Asked   Other Topics Concern  . None   Social History Narrative  . None    Current Outpatient  Prescriptions:  .  levothyroxine (SYNTHROID, LEVOTHROID) 125 MCG tablet, Take 125 mcg by mouth daily before breakfast., Disp: , Rfl:  .  ondansetron (ZOFRAN ODT) 4 MG disintegrating tablet, Take 1 tablet (4 mg total) by mouth every 8 (eight) hours as needed for nausea or vomiting., Disp: 15 tablet, Rfl: 0 .  oxyCODONE-acetaminophen (ROXICET) 5-325 MG tablet, Take 1 tablet by mouth every 8 (eight) hours as needed for moderate pain or severe pain (Do not drive or operate heavy machinery while taking as can cause drowsiness.)., Disp: 9 tablet, Rfl: 0 .  ketorolac (TORADOL) 10 MG tablet, Take 1 tablet (10 mg total) by mouth every 6 (six) hours as needed for  moderate pain or severe pain., Disp: 30 tablet, Rfl: 0 Allergies  Allergen Reactions  . Lodine [Etodolac] Shortness Of Breath  . Levofloxacin Rash  . Sulfur Rash  . Codeine Other (See Comments)  . Escitalopram Other (See Comments)    Intolerant.  [place] [Levofloxacin In D5w]   . Quetiapine Other (See Comments)    Excessive sedation at 50 mg.  . Seroquel [Quetiapine Fumarate]   . Sulfa Antibiotics   . Zanaflex [Tizanidine Hcl]   . Tizanidine Rash    Review of Systems  Constitutional: Positive for appetite change and chills. Negative for activity change, fatigue, fever and unexpected weight change.  HENT: Negative for congestion and sore throat.   Respiratory: Negative for cough, shortness of breath and wheezing.   Cardiovascular: Negative for chest pain and leg swelling.  Gastrointestinal: Positive for abdominal distention, abdominal pain, diarrhea and nausea. Negative for blood in stool, constipation and vomiting.  Genitourinary: Negative for difficulty urinating, flank pain and hematuria.  Musculoskeletal: Negative for back pain and neck pain.  Skin: Negative for color change and wound.  Neurological: Negative for dizziness and weakness.  Psychiatric/Behavioral: Negative for agitation. The patient is not nervous/anxious.   All other systems reviewed and are negative.      Vitals:   07/04/16 0908  BP: 135/78  Pulse: 72  Temp: 98.1 F (36.7 C)    Objective:   Physical Exam  Constitutional: She is oriented to person, place, and time. She appears well-developed and well-nourished. No distress.  HENT:  Head: Normocephalic and atraumatic.  Right Ear: External ear normal.  Left Ear: External ear normal.  Nose: Nose normal.  Mouth/Throat: Oropharynx is clear and moist.  Eyes: Conjunctivae and EOM are normal. Pupils are equal, round, and reactive to light. No scleral icterus.  Neck: Normal range of motion. Neck supple. No tracheal deviation present.  Cardiovascular:  Normal rate, regular rhythm, normal heart sounds and intact distal pulses.  Exam reveals no gallop and no friction rub.   No murmur heard. Pulmonary/Chest: Effort normal and breath sounds normal. No respiratory distress. She has no wheezes. She has no rales.  Abdominal: Soft. Bowel sounds are normal. She exhibits no distension. There is tenderness. There is no rebound and no guarding.  Soft, obese, epigastric to right upper quadrant tenderness, positive Murphy sign with catching then upon the restaurant taking a deep breath, no CVA tenderness.  Musculoskeletal: Normal range of motion. She exhibits no edema, tenderness or deformity.  Neurological: She is alert and oriented to person, place, and time. No cranial nerve deficit.  Skin: Skin is warm and dry. No rash noted. No erythema. No pallor.  Psychiatric: She has a normal mood and affect. Her behavior is normal. Judgment and thought content normal.  Vitals reviewed.      CBC  Latest Ref Rng & Units 07/03/2016 08/27/2014  WBC 3.6 - 11.0 K/uL 7.8 13.7(H)  Hemoglobin 12.0 - 16.0 g/dL 13.9 13.4  Hematocrit 35.0 - 47.0 % 41.5 40.6  Platelets 150 - 440 K/uL 377 406   CMP Latest Ref Rng & Units 07/03/2016 08/27/2014  Glucose 65 - 99 mg/dL 111(H) 100(H)  BUN 6 - 20 mg/dL 9 8  Creatinine 0.44 - 1.00 mg/dL 0.69 0.69  Sodium 135 - 145 mmol/L 136 138  Potassium 3.5 - 5.1 mmol/L 4.2 3.7  Chloride 101 - 111 mmol/L 103 104  CO2 22 - 32 mmol/L 24 23  Calcium 8.9 - 10.3 mg/dL 9.2 8.8  Total Protein 6.5 - 8.1 g/dL 8.0 -  Total Bilirubin 0.3 - 1.2 mg/dL 0.8 -  Alkaline Phos 38 - 126 U/L 107 -  AST 15 - 41 U/L 21 -  ALT 14 - 54 U/L 20 -   U/S: IMPRESSION: Mild tenderness over the gallbladder during the study without true sonographic Murphy's sign. No stones or wall thickening. Fatty infiltration of the liver.   Assessment:     40 year old with biliary colic    Plan:     I have personally reviewed her past medical history where she does have a  history of hypothyroidism and is status post thyroidectomy, otherwise she has some generalized musculoskeletal issues but no other chronic issues. I have personally reviewed her laboratory values from her ED visit where she has a normal [place] count and normal LFTs and bilirubin as well as lipase. I personally reviewed her ultrasound images which do show a contracted gallbladder with no wall thickening or fluid and no frank stones seen although I believe I do see some sludge. I have also reviewed the radiology reads from this as above.  The patient has classic symptoms for biliary colic with right upper quadrant and epigastric tenderness that moves through to her back around the shoulder blade with nausea increased bloating and flatulence and some diarrhea. Symptoms combined with what I believe is sludge on ultrasound limited to believe that she has true biliary colic. I discussed with her eating a bland diet in the meantime and attempting to stay hydrated as much as possible and to avoid any fatty foods even things with slight amounts of butter or oil in them.  I will check her use ibuprofen and will give her a prescription for Toradol as well to help with the pain.   The risks, benefits, complications, treatment options, and expected outcomes were discussed with the patient. The possibilities of bleeding, recurrent infection, finding a normal gallbladder, perforation of viscus organs, damage to surrounding structures, bile leak, abscess formation, needing a drain placed, the need for additional procedures, reaction to medication, pulmonary aspiration,  failure to diagnose a condition, the possible need to convert to an open procedure, and creating a complication requiring transfusion or operation were discussed with the patient. The patient concurred with the proposed plan. We'll set her up for a laparoscopic cholecystectomy in the week of November 6.

## 2016-07-07 NOTE — Anesthesia Procedure Notes (Signed)
Procedure Name: Intubation Date/Time: 07/07/2016 1:51 PM Performed by: Silvana Newness Pre-anesthesia Checklist: Patient identified, Emergency Drugs available, Suction available, Patient being monitored and Timeout performed Patient Re-evaluated:Patient Re-evaluated prior to inductionOxygen Delivery Method: Circle system utilized Preoxygenation: Pre-oxygenation with 100% oxygen Intubation Type: IV induction Ventilation: Mask ventilation without difficulty Laryngoscope Size: Mac and 3 Grade View: Grade I Tube type: Oral Tube size: 7.0 mm Number of attempts: 1 Airway Equipment and Method: Rigid stylet Placement Confirmation: ETT inserted through vocal cords under direct vision,  positive ETCO2 and breath sounds checked- equal and bilateral Secured at: 21 cm Tube secured with: Tape Dental Injury: Teeth and Oropharynx as per pre-operative assessment

## 2016-07-07 NOTE — Transfer of Care (Signed)
Immediate Anesthesia Transfer of Care Note  Patient: Hailey [name]  Procedure(s) Performed: Procedure(s): LAPAROSCOPIC CHOLECYSTECTOMY (N/A)  Patient Location: PACU  Anesthesia Type:General  Level of Consciousness: awake, alert , oriented and patient cooperative  Airway & Oxygen Therapy: Patient Spontanous Breathing and Patient connected to face mask oxygen  Post-op Assessment: Report given to RN, Post -op Vital signs reviewed and stable and Patient moving all extremities X 4  Post vital signs: Reviewed and stable  Last Vitals:  Vitals:   07/07/16 1236 07/07/16 1549  BP: 118/71 118/77  Pulse: 76 84  Resp: 18 16  Temp: 36.6 C 37.3 C    Last Pain:  Vitals:   07/07/16 1236  TempSrc: Oral  PainSc: 3          Complications: No apparent anesthesia complications

## 2016-07-10 ENCOUNTER — Encounter: Payer: Self-pay | Admitting: Surgery

## 2016-07-11 LAB — SURGICAL PATHOLOGY

## 2016-07-11 NOTE — Anesthesia Postprocedure Evaluation (Signed)
Anesthesia Post Note  Patient: Hailey [name]  Procedure(s) Performed: Procedure(s) (LRB): LAPAROSCOPIC CHOLECYSTECTOMY (N/A)  Patient location during evaluation: PACU Anesthesia Type: General Level of consciousness: awake and alert and oriented Pain management: pain level controlled Vital Signs Assessment: post-procedure vital signs reviewed and stable Respiratory status: spontaneous breathing Cardiovascular status: blood pressure returned to baseline Anesthetic complications: no    Last Vitals:  Vitals:   07/07/16 1649 07/07/16 1730  BP: (!) 114/59 (!) 114/55  Pulse: 83 88  Resp: 16 18  Temp: 36.7 C     Last Pain:  Vitals:   07/10/16 0816  TempSrc:   PainSc: 2                  Treyden Hakim

## 2016-07-13 ENCOUNTER — Encounter: Payer: Self-pay | Admitting: Surgery

## 2016-07-13 ENCOUNTER — Telehealth: Payer: Self-pay

## 2016-07-13 ENCOUNTER — Ambulatory Visit (INDEPENDENT_AMBULATORY_CARE_PROVIDER_SITE_OTHER): Payer: 59 | Admitting: Surgery

## 2016-07-13 VITALS — BP 136/89 | HR 91 | Temp 98.1°F | Wt 228.0 lb

## 2016-07-13 DIAGNOSIS — Z9049 Acquired absence of other specified parts of digestive tract: Secondary | ICD-10-CM | POA: Insufficient documentation

## 2016-07-13 DIAGNOSIS — K828 Other specified diseases of gallbladder: Secondary | ICD-10-CM

## 2016-07-13 HISTORY — DX: Acquired absence of other specified parts of digestive tract: Z90.49

## 2016-07-13 NOTE — Telephone Encounter (Signed)
Disability form from [employer] was filled out and faxed.

## 2016-07-13 NOTE — Patient Instructions (Addendum)
Please call our office with any questions or concerns.  Please do not submerge in a tub, hot tub, or pool until incisions are completely sealed.  Use sun block to incision area over the next year if this area will be exposed to sun. This helps decrease scarring.  You may now resume your normal activities. Listen to your body when lifting, if you have pain when lifting, stop and then try again in a few days.  If you develop redness, drainage, or pain at incision sites- call our office immediately and speak with a nurse.  You are able to return to work on 07/18/2016. Please remember not to lift more than 15 lbs for three more weeks (until 08/03/2016).

## 2016-07-13 NOTE — Progress Notes (Signed)
07/13/2016  HPI: Patient is status post laparoscopic cholecystectomy on 10/27. She presents for postop visit. She reports that her mother just had a medical emergency and the patient needs to leave town today to be with her. She did want to be seen in the office to assure that she was healing well to check if she could drive to be with her mother. At this point she denies any fevers, chills, nausea, vomiting, worsening pain. She has been tolerating a regular diet and although she initially had some diarrhea this is now improved. She has not required any pain medications for 3 days now. She does report minimal drainage of serous fluid from her periumbilical incision but denies any erythema or purulent drainage or worsening pain.  Vital signs: BP 136/89   Pulse 91   Temp 98.1 F (36.7 C) (Oral)   Wt 103.4 kg (228 lb)   LMP 06/21/2016   BMI 37.94 kg/m    Physical Exam: Constitutional: No acute distress Abdomen: Soft, nondistended, nontender to palpation. She is able to twist to the left and twist to the right without any pain. There is no expressible drainage from the periumbilical incision. There is mild ecchymosis over the periumbilical incision but otherwise no erythema, or purulent drainage.  Assessment/Plan: 40 year old female status post laparoscopic cholecystectomy.  -Patient still has a no heavy lifting restriction of more than 10-15 pounds for another 3 weeks. She may also return to work. She is now able to drive and leave town to be with her mother. Currently there are no acute needs and her wounds are healing well. Have recommended that she can apply dry gauze over the periumbilical incision if there is any drainage and to call or come back to the office is this drainage becomes purulent or if she were to have any erythema or worsening tenderness over that incision. Otherwise she may follow-up with [place] on an as-needed basis.   Melvyn Neth, [place]

## 2016-07-14 ENCOUNTER — Other Ambulatory Visit: Payer: Self-pay

## 2016-07-14 ENCOUNTER — Encounter: Payer: 59 | Admitting: Surgery

## 2016-08-02 DIAGNOSIS — B349 Viral infection, unspecified: Secondary | ICD-10-CM | POA: Diagnosis not present

## 2016-09-17 ENCOUNTER — Emergency Department: Payer: 59

## 2016-09-17 ENCOUNTER — Emergency Department
Admission: EM | Admit: 2016-09-17 | Discharge: 2016-09-17 | Disposition: A | Discharge status: Home / Self Care | Payer: 59 | Attending: Emergency Medicine | Admitting: Emergency Medicine

## 2016-09-17 DIAGNOSIS — Z91040 Latex allergy status: Secondary | ICD-10-CM | POA: Diagnosis not present

## 2016-09-17 DIAGNOSIS — R55 Syncope and collapse: Secondary | ICD-10-CM | POA: Diagnosis not present

## 2016-09-17 DIAGNOSIS — R509 Fever, unspecified: Secondary | ICD-10-CM | POA: Diagnosis not present

## 2016-09-17 DIAGNOSIS — E039 Hypothyroidism, unspecified: Secondary | ICD-10-CM | POA: Insufficient documentation

## 2016-09-17 DIAGNOSIS — J111 Influenza due to unidentified influenza virus with other respiratory manifestations: Secondary | ICD-10-CM | POA: Insufficient documentation

## 2016-09-17 DIAGNOSIS — Z79899 Other long term (current) drug therapy: Secondary | ICD-10-CM | POA: Diagnosis not present

## 2016-09-17 DIAGNOSIS — Z87891 Personal history of nicotine dependence: Secondary | ICD-10-CM | POA: Diagnosis not present

## 2016-09-17 DIAGNOSIS — J45909 Unspecified asthma, uncomplicated: Secondary | ICD-10-CM | POA: Insufficient documentation

## 2016-09-17 DIAGNOSIS — Z791 Long term (current) use of non-steroidal anti-inflammatories (NSAID): Secondary | ICD-10-CM | POA: Diagnosis not present

## 2016-09-17 DIAGNOSIS — R05 Cough: Secondary | ICD-10-CM | POA: Diagnosis not present

## 2016-09-17 DIAGNOSIS — R401 Stupor: Secondary | ICD-10-CM | POA: Diagnosis not present

## 2016-09-17 LAB — URINALYSIS, COMPLETE (UACMP) WITH MICROSCOPIC
Bacteria, UA: NONE SEEN
Bilirubin Urine: NEGATIVE
Glucose, UA: NEGATIVE mg/dL
Ketones, ur: NEGATIVE mg/dL
Leukocytes, UA: NEGATIVE
Nitrite: NEGATIVE
Protein, ur: 100 mg/dL — AB
Specific Gravity, Urine: 1.03 (ref 1.005–1.030)
pH: 5 (ref 5.0–8.0)

## 2016-09-17 LAB — CBC
HCT: 39.3 % (ref 35.0–47.0)
Hemoglobin: 13.3 g/dL (ref 12.0–16.0)
MCH: 29.5 pg (ref 26.0–34.0)
MCHC: 33.9 g/dL (ref 32.0–36.0)
MCV: 87.2 fL (ref 80.0–100.0)
Platelets: 304 10*3/uL (ref 150–440)
RBC: 4.5 MIL/uL (ref 3.80–5.20)
RDW: 14 % (ref 11.5–14.5)
WBC: 8 10*3/uL (ref 3.6–11.0)

## 2016-09-17 LAB — BASIC METABOLIC PANEL
Anion gap: 7 (ref 5–15)
BUN: 9 mg/dL (ref 6–20)
CO2: 24 mmol/L (ref 22–32)
Calcium: 8.8 mg/dL — ABNORMAL LOW (ref 8.9–10.3)
Chloride: 105 mmol/L (ref 101–111)
Creatinine, Ser: 0.8 mg/dL (ref 0.44–1.00)
GFR calc Af Amer: >60 mL/min (ref >60)
GFR calc non Af Amer: >60 mL/min (ref >60)
Glucose, Bld: 109 mg/dL — ABNORMAL HIGH (ref 65–99)
Potassium: 4.3 mmol/L (ref 3.5–5.1)
Sodium: 136 mmol/L (ref 135–145)

## 2016-09-17 LAB — RAPID INFLUENZA A&B ANTIGENS
Influenza A (ARMC): POSITIVE — AB
Influenza B (ARMC): NEGATIVE

## 2016-09-17 LAB — TROPONIN I: Troponin I: <0.03 ng/mL (ref <0.03)

## 2016-09-17 LAB — GLUCOSE, CAPILLARY: Glucose-Capillary: 93 mg/dL (ref 65–99)

## 2016-09-17 LAB — POCT PREGNANCY, URINE: Preg Test, Ur: NEGATIVE

## 2016-09-17 LAB — POCT RAPID STREP A: Streptococcus, Group A Screen (Direct): NEGATIVE

## 2016-09-17 MED ORDER — LIDOCAINE VISCOUS 2 % MT SOLN
15.0000 mL | Freq: Once | OROMUCOSAL | Status: AC
  Administered 2016-09-17: 15 mL via OROMUCOSAL
  Filled 2016-09-17: qty 15

## 2016-09-17 MED ORDER — ONDANSETRON HCL 4 MG/2ML IJ SOLN
4.0000 mg | Freq: Once | INTRAMUSCULAR | Status: AC
  Administered 2016-09-17: 4 mg via INTRAVENOUS
  Filled 2016-09-17: qty 2

## 2016-09-17 MED ORDER — ALBUTEROL SULFATE HFA 108 (90 BASE) MCG/ACT IN AERS: 2.0000 | INHALATION_SPRAY | Freq: Four times a day (QID) | RESPIRATORY_TRACT | 0 refills | Status: DC | PRN

## 2016-09-17 MED ORDER — LIDOCAINE VISCOUS 2 % MT SOLN: 20.0000 mL | OROMUCOSAL | 0 refills | Status: DC | PRN

## 2016-09-17 MED ORDER — DEXAMETHASONE SODIUM PHOSPHATE 10 MG/ML IJ SOLN
10.0000 mg | Freq: Once | INTRAMUSCULAR | Status: AC
  Administered 2016-09-17: 10 mg via INTRAVENOUS
  Filled 2016-09-17: qty 1

## 2016-09-17 MED ORDER — SODIUM CHLORIDE 0.9 % IV BOLUS (SEPSIS)
1000.0000 mL | Freq: Once | INTRAVENOUS | Status: AC
  Administered 2016-09-17: 1000 mL via INTRAVENOUS

## 2016-09-17 MED ORDER — IPRATROPIUM-ALBUTEROL 0.5-2.5 (3) MG/3ML IN SOLN
3.0000 mL | Freq: Once | RESPIRATORY_TRACT | Status: AC
  Administered 2016-09-17: 3 mL via RESPIRATORY_TRACT
  Filled 2016-09-17: qty 3

## 2016-09-17 NOTE — ED Triage Notes (Addendum)
Pt reports she was enroute urgent care in [place] when she became lightheaded and dizzy. Denies LOC. States she was going to urgent care to be seen for flu like symptoms that started on Friday. Pt c/o chest tightness, sore throat, and fever and SOB. Pt's husband  called EMS to bring her to hospital.

## 2016-09-17 NOTE — ED Provider Notes (Signed)
[facility] Emergency Department Provider Note __________________________________   I have reviewed the triage vital signs and the nursing notes.   HISTORY  Chief Complaint Near Syncope and URI   History limited by: Not Limited   HPI Hailey [name] is a 41 y.o. female who presents to the emergency department today because of concern for a near syncopal episode and sore throat. The patient was on her way to urgent care for URI like symptoms. She has been having sore throat, fever, shortness of breath for a few days. She has tried some advil and her inhaler without significant relief. The pain in her throat is severe. This morning after being in the shower she felt light headed and then on the way to urgent care in the car she felt like her vision was going black and that she was close to passing out.    Past Medical History:  Diagnosis Date  . Allergic state   . Arthritis   . Asthma   . Cervical disc disease   . Depression   . GERD (gastroesophageal reflux disease)   . Glucose intolerance (impaired glucose tolerance)   . H/O colonoscopy   . HPV (human papilloma virus) infection   . Hypothyroidism   . Interstitial cystitis   . LGSIL (low grade squamous intraepithelial dysplasia)   . Paresthesia     Patient Active Problem List   Diagnosis Date Noted  . Status post laparoscopic cholecystectomy 07/13/2016  . Interstitial cystitis 07/03/2016  . Pelvic pain in female 07/03/2016  . 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 Morbid obesity with BMI of 40.0-44.9, adult ([place]) 02/14/2016  . Polyarthralgia 02/14/2016  . GERD (gastroesophageal reflux disease) 02/10/2014  . Hypothyroid 01/30/2014    Past Surgical History:  Procedure Laterality Date  . BREAST BIOPSY Right 2017   NEG  . CHOLECYSTECTOMY N/A 07/07/2016   Procedure: LAPAROSCOPIC CHOLECYSTECTOMY;  Surgeon: Olean Ree, MD;  Location: [facility] ORS;  Service: General;  Laterality: N/A;  . COLONOSCOPY  WITH PROPOFOL N/A 10/11/2015   Procedure: COLONOSCOPY WITH PROPOFOL;  Surgeon: Lollie Sails, MD;  Location: [facility] ENDOSCOPY;  Service: Endoscopy;  Laterality: N/A;  . ESOPHAGOGASTRODUODENOSCOPY (EGD) WITH PROPOFOL N/A 10/11/2015   Procedure: ESOPHAGOGASTRODUODENOSCOPY (EGD) WITH PROPOFOL;  Surgeon: Lollie Sails, MD;  Location: [facility] ENDOSCOPY;  Service: Endoscopy;  Laterality: N/A;  . TONSILLECTOMY    . TOTAL THYROIDECTOMY  2005    Prior to Admission medications   Medication Sig Start Date End Date Taking? Authorizing Provider  ibuprofen (ADVIL,MOTRIN) 100 MG tablet Take 100 mg by mouth every 6 (six) hours as needed for fever.    Historical Provider, MD  levothyroxine (SYNTHROID, LEVOTHROID) 125 MCG tablet Take 125 mcg by mouth daily before breakfast.    Historical Provider, MD    Allergies Lodine [etodolac]; Levofloxacin; Sulfur; Codeine; Escitalopram; Levaquin [levofloxacin in d5w]; Quetiapine; Seroquel [quetiapine fumarate]; Sulfa antibiotics; Zanaflex [tizanidine hcl]; Latex; and Tizanidine  Family History  Problem Relation Age of Onset  . Hypertension Mother   . Stroke Mother   . Cancer Mother   . Diabetes Father   . Hypertension Father   . Hypertension Brother   . Hypertension Maternal Grandmother   . Stroke Maternal Grandmother   . Diabetes Maternal Grandmother   . Breast cancer Neg Hx     Social History Social History  Substance Use Topics  . Smoking status: Former Research scientist (life sciences)  . Smokeless tobacco: Never Used  . Alcohol use Yes     Comment:  Occ.    Review of Systems  Constitutional: Positive for fever. Cardiovascular: Negative for chest pain. Respiratory: Positive for shortness of breath. Gastrointestinal: Negative for abdominal pain, vomiting and diarrhea. Genitourinary: Negative for dysuria. Musculoskeletal: Negative for back pain. Skin: Negative for rash. Neurological: Negative for headaches, focal weakness or numbness.  10-point ROS otherwise  negative.  ____________________________________________   PHYSICAL EXAM:  VITAL SIGNS: ED Triage Vitals  Enc Vitals Group     BP --      Pulse Rate 09/17/16 0844 80     Resp 09/17/16 0844 20     Temp 09/17/16 0844 97.5 F (36.4 C)     Temp Source 09/17/16 0844 Oral     SpO2 09/17/16 0844 98 %     Weight 09/17/16 0832 220 lb (99.8 kg)     Height 09/17/16 0832 5\' 4"  (1.626 m)     Head Circumference --      Peak Flow --      Pain Score 09/17/16 0832 8   Constitutional: Alert and oriented. Well appearing and in no distress. Eyes: Conjunctivae are normal. Normal extraocular movements. ENT   Head: Normocephalic and atraumatic.   Nose: No congestion/rhinnorhea.   Mouth/Throat: Mucous membranes are moist. Uvula midline. No tonsillar swelling.   Neck: No stridor. Hematological/Lymphatic/Immunilogical: No cervical lymphadenopathy. Cardiovascular: Normal rate, regular rhythm.  No murmurs, rubs, or gallops.  Respiratory: Normal respiratory effort without tachypnea nor retractions. Breath sounds are clear and equal bilaterally. No wheezes/rales/rhonchi. Gastrointestinal: Soft and non tender. No rebound. No guarding.  Genitourinary: Deferred Musculoskeletal: Normal range of motion in all extremities. No lower extremity edema. Neurologic:  Normal speech and language. No gross focal neurologic deficits are appreciated.  Skin:  Skin is warm, dry and intact. No rash noted. Psychiatric: Mood and affect are normal. Speech and behavior are normal. Patient exhibits appropriate insight and judgment.  ____________________________________________    LABS (pertinent positives/negatives)  Labs Reviewed  RAPID INFLUENZA A&B ANTIGENS ([facility] ONLY) - Abnormal; Notable for the following:       Result Value   Influenza A ([facility]) POSITIVE (*)    All other components within normal limits  BASIC METABOLIC PANEL - Abnormal; Notable for the following:    Glucose, Bld 109 (*)    Calcium 8.8  (*)    All other components within normal limits  URINALYSIS, COMPLETE (UACMP) WITH MICROSCOPIC - Abnormal; Notable for the following:    Color, Urine YELLOW (*)    APPearance HAZY (*)    Hgb urine dipstick LARGE (*)    Protein, ur 100 (*)    Squamous Epithelial / LPF 6-30 (*)    All other components within normal limits  CULTURE, GROUP A STREP ([place])  URINE CULTURE  CBC  GLUCOSE, CAPILLARY  TROPONIN I  CBG MONITORING, ED  POC URINE PREG, ED  POCT PREGNANCY, URINE  POCT RAPID STREP A     ____________________________________________   EKG  I, Nance Pear, attending physician, personally viewed and interpreted this EKG  EKG Time: 0916 Rate: 85 Rhythm: normal sinus rhythm Axis: normal Intervals: qtc 423 QRS: narrow ST changes: no st elevation Impression: normal ekg   ____________________________________________    RADIOLOGY  CXR IMPRESSION:  No active cardiopulmonary disease.      ____________________________________________   PROCEDURES  Procedures  ____________________________________________   INITIAL IMPRESSION / ASSESSMENT AND PLAN / ED COURSE  Pertinent labs & imaging results that were available during my care of the patient were reviewed by me and considered  in my medical decision making (see chart for details).  Patient presented to the emergency department today because of feeling unwell, sore throat and some shortness of breath. Workup here was positive for influenza A. I think this likely explains patient's symptoms. Discussed this with patient. Will give patient refill for albuterol inhaler. Additionally patient was given dose of IV steroids for throat discomfort.  ____________________________________________   FINAL CLINICAL IMPRESSION(S) / ED DIAGNOSES  Final diagnoses:  Influenza     Note: This dictation was prepared with Dragon dictation. Any transcriptional errors that result from this process are unintentional      Nance Pear, MD 09/17/16 1206

## 2016-09-17 NOTE — Discharge Instructions (Signed)
Please seek medical attention for any high fevers, chest pain, shortness of breath, change in behavior, persistent vomiting, bloody stool or any other new or concerning symptoms.  

## 2016-09-19 DIAGNOSIS — J101 Influenza due to other identified influenza virus with other respiratory manifestations: Secondary | ICD-10-CM | POA: Diagnosis not present

## 2016-09-19 LAB — URINE CULTURE: Culture: NO GROWTH

## 2016-09-20 LAB — CULTURE, GROUP A STREP (THRC)

## 2016-10-09 DIAGNOSIS — B9689 Other specified bacterial agents as the cause of diseases classified elsewhere: Secondary | ICD-10-CM | POA: Diagnosis not present

## 2016-10-09 DIAGNOSIS — F3341 Major depressive disorder, recurrent, in partial remission: Secondary | ICD-10-CM | POA: Diagnosis not present

## 2016-10-09 DIAGNOSIS — R300 Dysuria: Secondary | ICD-10-CM | POA: Diagnosis not present

## 2016-10-09 DIAGNOSIS — N760 Acute vaginitis: Secondary | ICD-10-CM | POA: Diagnosis not present

## 2016-10-09 DIAGNOSIS — F5105 Insomnia due to other mental disorder: Secondary | ICD-10-CM | POA: Diagnosis not present

## 2016-11-23 ENCOUNTER — Other Ambulatory Visit: Payer: Self-pay | Admitting: Obstetrics and Gynecology

## 2016-11-23 DIAGNOSIS — Z1231 Encounter for screening mammogram for malignant neoplasm of breast: Secondary | ICD-10-CM

## 2016-11-29 ENCOUNTER — Ambulatory Visit
Admission: RE | Admit: 2016-11-29 | Discharge: 2016-11-29 | Disposition: A | Discharge status: Home / Self Care | Payer: 59 | Source: Ambulatory Visit | Attending: Obstetrics and Gynecology | Admitting: Obstetrics and Gynecology

## 2016-11-29 DIAGNOSIS — Z1231 Encounter for screening mammogram for malignant neoplasm of breast: Secondary | ICD-10-CM | POA: Diagnosis not present

## 2016-12-04 ENCOUNTER — Encounter: Payer: Self-pay | Admitting: Endocrinology

## 2016-12-04 ENCOUNTER — Ambulatory Visit (INDEPENDENT_AMBULATORY_CARE_PROVIDER_SITE_OTHER): Payer: 59 | Admitting: Endocrinology

## 2016-12-04 VITALS — BP 100/64 | HR 68 | Ht 64.0 in | Wt 231.0 lb

## 2016-12-04 DIAGNOSIS — E890 Postprocedural hypothyroidism: Secondary | ICD-10-CM

## 2016-12-04 LAB — T4, FREE: Free T4: 0.84 ng/dL (ref 0.60–1.60)

## 2016-12-04 LAB — TSH: TSH: 14.71 u[IU]/mL — ABNORMAL HIGH (ref 0.35–4.50)

## 2016-12-04 MED ORDER — SYNTHROID 200 MCG PO TABS: 200.0000 ug | ORAL_TABLET | Freq: Every day | ORAL | 1 refills | Status: DC

## 2016-12-04 NOTE — Addendum Note (Signed)
Addended by: Elayne Snare on: 12/04/2016 09:30 PM   Modules accepted: Orders

## 2016-12-04 NOTE — Progress Notes (Addendum)
Patient ID: Hailey [name], female   DOB: 09-Sep-1976, 41 y.o.   MRN: [id number]             Referring Physician: Sabra Heck  Reason for Appointment:  Hypothyroidism, new visit    History of Present Illness:    Hypothyroidism was first diagnosed in early 2006 after thyroidectomy. The following is a copy of her surgical assessment prior to her surgery: Patient has a history of hyperthyroidism who had a dominant right thyroid nodule identified by her endocrinologist. This nodule is photopenic and ultrasound guided biopsy has demonstrated a follicular lesion with scant colloid. 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 Her endocrinologist has recommended subtotal thyroidectomy to take care of both the nodule in the right thyroid lobe as well as the hyperfunctioning left thyroid tissue  After surgery she was put on levothyroxine, unknown doses Subsequently she went to an endocrinologist who felt that she will do better with a combination of Cytomel and levothyroxine since she was having issues with weight gain The patient does not think she felt any better or had any significant weight loss with this regimen but this was continued until 04/2016  The patient has been treated with  brand name Synthroid since 05/2016 She had gone to another physician for a second opinion and she was switched from her previous regimen of 88 g levothyroxine and 25 g of Cytomel because of her free T3 level being high at 5.8 However appears that she was off medication for months before her Synthroid was started and follow-up of 4 weeks later showed her TSH to be 8.2  With starting brand name Synthroid supplementation she does not feel any different back to when she was taking levothyroxine and Cytomel At present her main problem is persistent joint pains and gaining weight She is now taking 150 g of levothyroxine           Patient's weight history is as follows:  Wt Readings from Last 3 Encounters:  12/04/16 231 lb (104.8 kg)  09/17/16 220 lb (99.8 kg)  07/13/16 228 lb (103.4 kg)    Thyroid function results have been as follows:  Lab Results  Component Value Date   TSH 8.20 (A) 06/12/2016   TSH 5.062 (H) 12/16/2014   TSH 1.09 12/31/2013     Past Medical History:  Diagnosis Date  . Allergic state   . Arthritis   . Asthma   . Cervical disc disease   . Depression   . GERD (gastroesophageal reflux disease)   . Glucose intolerance (impaired glucose tolerance)   . H/O colonoscopy   . HPV (human papilloma virus) infection   . Hypothyroidism   . Interstitial cystitis   . LGSIL (low grade squamous intraepithelial dysplasia)   . Paresthesia     Past Surgical History:  Procedure Laterality Date  . BREAST BIOPSY Right 10/18/2015   bx/clip- neg  . CHOLECYSTECTOMY N/A 07/07/2016   Procedure: LAPAROSCOPIC CHOLECYSTECTOMY;  Surgeon: Olean Ree, MD;  Location: [facility] ORS;  Service: General;  Laterality: N/A;  . COLONOSCOPY WITH PROPOFOL N/A 10/11/2015   Procedure: COLONOSCOPY WITH PROPOFOL;  Surgeon: Lollie Sails, MD;  Location: [facility] ENDOSCOPY;  Service: Endoscopy;  Laterality: N/A;  . ESOPHAGOGASTRODUODENOSCOPY (EGD) WITH PROPOFOL N/A 10/11/2015   Procedure: ESOPHAGOGASTRODUODENOSCOPY (EGD) WITH PROPOFOL;  Surgeon: Lollie Sails, MD;  Location: [facility] ENDOSCOPY;  Service: Endoscopy;  Laterality: N/A;  . TONSILLECTOMY    . TOTAL  THYROIDECTOMY  2005    Family History  Problem Relation Age of Onset  . Hypertension Mother   . Stroke Mother   . Cancer Mother   . Goiter Mother   . Diabetes Father   . Hypertension Father   . Hypertension Brother   . Hypertension Maternal Grandmother   . Stroke Maternal Grandmother   . Diabetes Maternal Grandmother   . Breast cancer Cousin 68    pat cousin    Social History:  reports that she has quit smoking. She has never used smokeless tobacco. She  reports that she drinks alcohol. She reports that she does not use drugs.  Allergies:  Allergies  Allergen Reactions  . Lodine [Etodolac] Shortness Of Breath  . Levofloxacin Rash  . Sulfur Rash  . Codeine Other (See Comments)  . Escitalopram Other (See Comments)    Intolerant.  [place] [Levofloxacin In D5w]   . Quetiapine Other (See Comments)    Excessive sedation at 50 mg.  . Seroquel [Quetiapine Fumarate]   . Sulfa Antibiotics   . Zanaflex [Tizanidine Hcl]   . Latex Rash  . Tizanidine Rash    Allergies as of 12/04/2016      Reactions   Lodine [etodolac] Shortness Of Breath   Levofloxacin Rash   Sulfur Rash   Codeine Other (See Comments)   Escitalopram Other (See Comments)   Intolerant.   Levaquin [levofloxacin In D5w]    Quetiapine Other (See Comments)   Excessive sedation at 50 mg.   Seroquel [quetiapine Fumarate]    Sulfa Antibiotics    Zanaflex [tizanidine Hcl]    Latex Rash   Tizanidine Rash      Medication List       Accurate as of 12/04/16  2:50 PM. Always use your most recent med list.          albuterol 108 (90 Base) MCG/ACT inhaler Commonly known as:  PROVENTIL HFA;VENTOLIN HFA Inhale 2 puffs into the lungs every 6 (six) hours as needed for wheezing or shortness of breath.   ibuprofen 100 MG tablet Commonly known as:  ADVIL,MOTRIN Take 100 mg by mouth every 6 (six) hours as needed for fever.   levothyroxine 125 MCG tablet Commonly known as:  SYNTHROID, LEVOTHROID Take 125 mcg by mouth daily before breakfast.   lidocaine 2 % solution Commonly known as:  XYLOCAINE Use as directed 20 mLs in the mouth or throat every 4 (four) hours as needed for mouth pain.   pantoprazole 40 MG tablet Commonly known as:  PROTONIX Take by mouth.   temazepam 15 MG capsule Commonly known as:  RESTORIL          Review of Systems  Constitutional: Positive for weight gain and malaise.  HENT: Negative for hoarseness and trouble swallowing.   Respiratory:  Negative for shortness of breath.   Cardiovascular: Negative for leg swelling.  Gastrointestinal: Positive for diarrhea.       Loose stools since her cholecystectomy  Endocrine: Positive for menstrual changes.       Her menstrual cycles lasting longer and once this year had 2 cycles in 1 month, has discussed with the gynecologist  Musculoskeletal: Positive for joint pain and muscle aches.  Skin: Negative for rash.  Neurological: Negative for weakness.  Psychiatric/Behavioral: Positive for depressed mood and insomnia.                Examination:    BP 100/64   Pulse 68   Ht 5\' 4"  (1.626 m)  Wt 231 lb (104.8 kg)   LMP 11/09/2016   SpO2 99%   BMI 39.65 kg/m   GENERAL:  Relatively large build.  Generalized obesity present without cushingoid features.  No generalized pallor present    Skin:  no rash or pigmentation.  EYES:  No prominence of the eyes or swelling of the eyelids  ENT: Oral mucosa and tongue normal.  Neck: No lymphadenopathy, no central fat present  THYROID:  Not palpable, she has a relatively large anterior neck area.  HEART:  Normal  S1 and S2; no murmur or click.  CHEST:    Lungs: Vescicular breath sounds heard equally.  No crepitations/ wheeze.  ABDOMEN:  No distention.  Liver and spleen not palpable.  No other mass or tenderness.  NEUROLOGICAL: Reflexes are bilaterally normal at ankles, difficult to elicit  at biceps.  JOINTS:  Normal.   Assessment:  HYPOTHYROIDISM, Postsurgical  Patient has currently been taking brand name Synthroid 150 g daily since 05/2016 This was apparently a new regimen and her TSH in October was checked only a month after starting this and was mildly increased Not clear if she is needing an adjustment to her dose at this time However subjectively she is not complaining of any typical symptoms of hypothyroidism, her tendency to weight gain, joint pains and also symptoms of insomnia or unrelated and  nonspecific  Arthralgia, myalgia, depression and late insomnia: Currently not being managed in a specific manner She has not been given any specific management plan by her rheumatologist  She may have fibromyalgia also especially with her late insomnia  PLAN:  Check thyroid levels today and decide on the dose of her Synthroid  Follow-up in 2 months if she has a dosage change otherwise 6 months  She will need to follow-up with her PCP to decide on management of her arthralgias, insomnia and depression; recommended that she may be able to try medications such as amitriptyline, Cymbalta and Celebrex but will defer to PCP   [facility] 12/04/2016, 2:50 PM   Consultation note copy sent to the PCP  Note: This office note was prepared with Dragon voice recognition system technology. Any transcriptional errors that result from this process are unintentional.  ADDENDUM: Currently TSH is 14  Will increase her dose to 200 g daily and follow-up in 2 months

## 2016-12-04 NOTE — Addendum Note (Signed)
Addended by: Elayne Snare on: 12/04/2016 09:33 PM   Modules accepted: Orders

## 2017-01-01 DIAGNOSIS — H52223 Regular astigmatism, bilateral: Secondary | ICD-10-CM | POA: Diagnosis not present

## 2017-01-01 DIAGNOSIS — H5201 Hypermetropia, right eye: Secondary | ICD-10-CM | POA: Diagnosis not present

## 2017-01-29 ENCOUNTER — Other Ambulatory Visit: Payer: Self-pay | Admitting: Endocrinology

## 2017-01-29 ENCOUNTER — Telehealth: Payer: Self-pay | Admitting: Endocrinology

## 2017-01-29 DIAGNOSIS — E063 Autoimmune thyroiditis: Secondary | ICD-10-CM

## 2017-01-29 NOTE — Telephone Encounter (Signed)
Hailey [name] Self [phone]   [name] called to check on getting labs before her appointment on Friday. Will you check and see if she needs labs and let her know.

## 2017-01-29 NOTE — Telephone Encounter (Signed)
See message and please advise. I did not see any active orders at this time.

## 2017-01-29 NOTE — Telephone Encounter (Signed)
Patient notified via voicemail.

## 2017-01-29 NOTE — Telephone Encounter (Signed)
I have ordered labs: She can come here for the labs here before her visit otherwise which ever lab is more convenient to her

## 2017-01-30 ENCOUNTER — Other Ambulatory Visit
Admission: RE | Admit: 2017-01-30 | Discharge: 2017-01-30 | Disposition: A | Discharge status: Home / Self Care | Payer: 59 | Source: Ambulatory Visit | Attending: Endocrinology | Admitting: Endocrinology

## 2017-01-30 DIAGNOSIS — E063 Autoimmune thyroiditis: Secondary | ICD-10-CM | POA: Diagnosis not present

## 2017-01-30 LAB — TSH: TSH: 2.544 u[IU]/mL (ref 0.350–4.500)

## 2017-01-30 LAB — T4, FREE: Free T4: 1.27 ng/dL — ABNORMAL HIGH (ref 0.61–1.12)

## 2017-01-30 NOTE — Telephone Encounter (Signed)
Patient is returning your call.  

## 2017-02-02 ENCOUNTER — Ambulatory Visit (INDEPENDENT_AMBULATORY_CARE_PROVIDER_SITE_OTHER): Payer: 59 | Admitting: Endocrinology

## 2017-02-02 ENCOUNTER — Encounter: Payer: Self-pay | Admitting: Endocrinology

## 2017-02-02 VITALS — BP 104/72 | HR 71 | Ht 64.0 in | Wt 229.8 lb

## 2017-02-02 DIAGNOSIS — E890 Postprocedural hypothyroidism: Secondary | ICD-10-CM

## 2017-02-02 MED ORDER — LIOTHYRONINE SODIUM 5 MCG PO TABS: 10.0000 ug | ORAL_TABLET | Freq: Every day | ORAL | 2 refills | Status: DC

## 2017-02-02 MED ORDER — SYNTHROID 150 MCG PO TABS: ORAL_TABLET | ORAL | 3 refills | Status: DC

## 2017-02-02 NOTE — Progress Notes (Signed)
Patient ID: Hailey [name], female   DOB: 07/03/1976, 41 y.o.   MRN: [id number]             Referring Physician: Sabra Heck  Reason for Appointment:  Hypothyroidism, follow-up visit    History of Present Illness:    Hypothyroidism was first diagnosed in early 2006 after thyroidectomy.   After her thyroid surgery she was put on levothyroxine, unknown doses Subsequently she went to an endocrinologist who felt that she will do better with a combination of Cytomel and levothyroxine since she was having weight gain The patient does not think she felt any better or had any significant weight loss with this regimen but this was continued until 04/2016 She had gone to another physician for a second opinion and she was switched from her previous regimen of 88 g levothyroxine and 25 g of Cytomel because of her free T3 level being high at 5.8  The patient has been treated with  brand name Synthroid since 05/2016 With brand name Synthroid supplementation she does not feel any different compared to when she was taking levothyroxine and Cytomel  On her initial consultation she was complaining about weight gain, joint pains and fatigue Since her TSH was 14.7 her dose was increased from the previous 150 g up to 200 g Now even though her TSH is back to normal she is still complaining of fatigue She is having somewhat less weight gain and joint pains She is very consistent with taking her Synthroid before breakfast  Patient's weight history is as follows:  Wt Readings from Last 3 Encounters:  02/02/17 229 lb 12.8 oz (104.2 kg)  12/04/16 231 lb (104.8 kg)  09/17/16 220 lb (99.8 kg)    Thyroid function results have been as follows:  Lab Results  Component Value Date   FREET4 1.27 (H) 01/30/2017   FREET4 0.84 12/04/2016   TSH 2.544 01/30/2017   TSH 14.71 (H) 12/04/2016   TSH 8.20 (A) 06/12/2016    The following is a copy of her surgical assessment prior to her thyroid surgery: Patient has a  history of hyperthyroidism who had a dominant right thyroid nodule identified by her endocrinologist. This nodule is photopenic and ultrasound guided biopsy has demonstrated a follicular lesion with scant colloid. 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 Her endocrinologist has recommended subtotal thyroidectomy to take care of both the nodule in the right thyroid lobe as well as the hyperfunctioning left thyroid tissue  Past Medical History:  Diagnosis Date  . Allergic state   . Arthritis   . Asthma   . Cervical disc disease   . Depression   . GERD (gastroesophageal reflux disease)   . Glucose intolerance (impaired glucose tolerance)   . H/O colonoscopy   . HPV (human papilloma virus) infection   . Hypothyroidism   . Interstitial cystitis   . LGSIL (low grade squamous intraepithelial dysplasia)   . Paresthesia     Past Surgical History:  Procedure Laterality Date  . BREAST BIOPSY Right 10/18/2015   bx/clip- neg  . CHOLECYSTECTOMY N/A 07/07/2016   Procedure: LAPAROSCOPIC CHOLECYSTECTOMY;  Surgeon: Olean Ree, MD;  Location: [facility] ORS;  Service: General;  Laterality: N/A;  . COLONOSCOPY WITH PROPOFOL N/A 10/11/2015   Procedure: COLONOSCOPY WITH PROPOFOL;  Surgeon: Lollie Sails, MD;  Location: [facility] ENDOSCOPY;  Service: Endoscopy;  Laterality: N/A;  . ESOPHAGOGASTRODUODENOSCOPY (EGD) WITH PROPOFOL N/A 10/11/2015   Procedure: ESOPHAGOGASTRODUODENOSCOPY (  EGD) WITH PROPOFOL;  Surgeon: Lollie Sails, MD;  Location: [facility] ENDOSCOPY;  Service: Endoscopy;  Laterality: N/A;  . TONSILLECTOMY    . TOTAL THYROIDECTOMY  2005    Family History  Problem Relation Age of Onset  . Hypertension Mother   . Stroke Mother   . Cancer Mother   . Goiter Mother   . Diabetes Father   . Hypertension Father   . Hypertension Brother   . Hypertension Maternal Grandmother   . Stroke Maternal  Grandmother   . Diabetes Maternal Grandmother   . Breast cancer Cousin 49       pat cousin    Social History:  reports that she has quit smoking. She has never used smokeless tobacco. She reports that she drinks alcohol. She reports that she does not use drugs.  Allergies:  Allergies  Allergen Reactions  . Lodine [Etodolac] Shortness Of Breath  . Levofloxacin Rash  . Sulfur Rash  . Codeine Other (See Comments)  . Escitalopram Other (See Comments)    Intolerant.  [place] [Levofloxacin In D5w]   . Quetiapine Other (See Comments)    Excessive sedation at 50 mg.  . Seroquel [Quetiapine Fumarate]   . Sulfa Antibiotics   . Zanaflex [Tizanidine Hcl]   . Latex Rash  . Tizanidine Rash    Allergies as of 02/02/2017      Reactions   Lodine [etodolac] Shortness Of Breath   Levofloxacin Rash   Sulfur Rash   Codeine Other (See Comments)   Escitalopram Other (See Comments)   Intolerant.   Levaquin [levofloxacin In D5w]    Quetiapine Other (See Comments)   Excessive sedation at 50 mg.   Seroquel [quetiapine Fumarate]    Sulfa Antibiotics    Zanaflex [tizanidine Hcl]    Latex Rash   Tizanidine Rash      Medication List       Accurate as of 02/02/17 10:02 AM. Always use your most recent med list.          albuterol 108 (90 Base) MCG/ACT inhaler Commonly known as:  PROVENTIL HFA;VENTOLIN HFA Inhale 2 puffs into the lungs every 6 (six) hours as needed for wheezing or shortness of breath.   ibuprofen 100 MG tablet Commonly known as:  ADVIL,MOTRIN Take 100 mg by mouth every 6 (six) hours as needed for fever.   pantoprazole 40 MG tablet Commonly known as:  PROTONIX Take by mouth.   SYNTHROID 200 MCG tablet Generic drug:  levothyroxine Take 1 tablet (200 mcg total) by mouth daily before breakfast.          Review of Systems   Has had increased menstrual bleeding              Examination:    BP 104/72   Pulse 71   Ht 5\' 4"  (1.626 m)   Wt 229 lb 12.8 oz  (104.2 kg)   SpO2 97%   BMI 39.45 kg/m    Assessment:  HYPOTHYROIDISM, Postsurgical  Her TSH is back to normal with 200 g of levothyroxine/Synthroid She is however having significant problems with fatigue, lack of motivation, some insomnia and depression and she is apparently not able to tolerate any antidepressants in the past  Even with increasing her Synthroid she only had some improvement in her muscle aches and no other subjective improvement despite normalization of TSH  PLAN:  Although she may not benefit as before she is willing to try a combination of Cytomel and  brand name  Synthroid again Will try her on 10 g of Cytomel along with 150 g of levothyroxine, 7-1/2 tablets per week and follow up in 6 weeks If she wants to go back to Synthroid 200 she will let us know  [facility] 02/02/2017, 10:02 AM     Note: This office note was prepared with Dragon voice recognition system technology. Any transcriptional errors that result from this process are unintentional.

## 2017-02-02 NOTE — Patient Instructions (Addendum)
Synthroid 150 plus 1/2 pill extra once a week  Cytomel 2 in am

## 2017-02-05 MED ORDER — SYNTHROID 150 MCG PO TABS: ORAL_TABLET | ORAL | 3 refills | Status: DC

## 2017-03-17 ENCOUNTER — Encounter: Payer: Self-pay | Admitting: Gynecology

## 2017-03-17 ENCOUNTER — Ambulatory Visit
Admission: EM | Admit: 2017-03-17 | Discharge: 2017-03-17 | Disposition: A | Discharge status: Home / Self Care | Payer: 59 | Attending: Family Medicine | Admitting: Family Medicine

## 2017-03-17 DIAGNOSIS — J029 Acute pharyngitis, unspecified: Secondary | ICD-10-CM

## 2017-03-17 DIAGNOSIS — Z20818 Contact with and (suspected) exposure to other bacterial communicable diseases: Secondary | ICD-10-CM

## 2017-03-17 LAB — RAPID STREP SCREEN (MED CTR MEBANE ONLY): Streptococcus, Group A Screen (Direct): NEGATIVE

## 2017-03-17 MED ORDER — PENICILLIN G BENZATHINE 1200000 UNIT/2ML IM SUSP
1.2000 10*6.[IU] | Freq: Once | INTRAMUSCULAR | Status: AC
  Administered 2017-03-17: 1.2 10*6.[IU] via INTRAMUSCULAR

## 2017-03-17 NOTE — ED Triage Notes (Signed)
Per patient sore throat x this am.

## 2017-03-17 NOTE — ED Provider Notes (Signed)
[facility]    CSN: [id number] Arrival date & time: 03/17/17  0800     History   Chief Complaint Chief Complaint  Patient presents with  . Sore Throat    HPI Hailey [name] is a 41 y.o. female.   Patient is here because of sore throat that started this morning the big issue is that her daughter started complaining of a sore redness sore throat last night and nausea now patient woke up with her throat feeling like this glass in her throat. She skin greatly town to go to [facility] her husbands had no symptoms and her daughter state that he is feeling miserable past smoking history she's had a history of thyroid disease depression and she has a history of arthritis and cervical disc disease. She is allergic to sulfa, codeine, Levaquin, latex, Seroquel and Zanaflex. She is a former smoker   The history is provided by the patient. The history is limited by a language barrier. No language interpreter was used.  Sore Throat  This is a new problem. The current episode started 3 to 5 hours ago. The problem occurs constantly. The problem has not changed since onset.Pertinent negatives include no chest pain, no abdominal pain and no headaches. The symptoms are aggravated by drinking. Nothing relieves the symptoms. She has tried nothing for the symptoms. The treatment provided no relief.    Past Medical History:  Diagnosis Date  . Allergic state   . Arthritis   . Asthma   . Cervical disc disease   . Depression   . GERD (gastroesophageal reflux disease)   . Glucose intolerance (impaired glucose tolerance)   . H/O colonoscopy   . HPV (human papilloma virus) infection   . Hypothyroidism   . Interstitial cystitis   . LGSIL (low grade squamous intraepithelial dysplasia)   . Paresthesia     Patient Active Problem List   Diagnosis Date Noted  . Status post laparoscopic cholecystectomy 07/13/2016  . Interstitial cystitis 07/03/2016  . Pelvic pain in female 07/03/2016  .  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 Morbid obesity with BMI of 40.0-44.9, adult ([place]) 02/14/2016  . Polyarthralgia 02/14/2016  . GERD (gastroesophageal reflux disease) 02/10/2014  . Hypothyroid 01/30/2014    Past Surgical History:  Procedure Laterality Date  . BREAST BIOPSY Right 10/18/2015   bx/clip- neg  . CHOLECYSTECTOMY N/A 07/07/2016   Procedure: LAPAROSCOPIC CHOLECYSTECTOMY;  Surgeon: Olean Ree, MD;  Location: [facility] ORS;  Service: General;  Laterality: N/A;  . COLONOSCOPY WITH PROPOFOL N/A 10/11/2015   Procedure: COLONOSCOPY WITH PROPOFOL;  Surgeon: Lollie Sails, MD;  Location: [facility] ENDOSCOPY;  Service: Endoscopy;  Laterality: N/A;  . ESOPHAGOGASTRODUODENOSCOPY (EGD) WITH PROPOFOL N/A 10/11/2015   Procedure: ESOPHAGOGASTRODUODENOSCOPY (EGD) WITH PROPOFOL;  Surgeon: Lollie Sails, MD;  Location: [facility] ENDOSCOPY;  Service: Endoscopy;  Laterality: N/A;  . TONSILLECTOMY    . TOTAL THYROIDECTOMY  2005    OB History    No data available       Home Medications    Prior to Admission medications   Medication Sig Start Date End Date Taking? Authorizing Provider  albuterol (PROVENTIL HFA;VENTOLIN HFA) 108 (90 Base) MCG/ACT inhaler Inhale 2 puffs into the lungs every 6 (six) hours as needed for wheezing or shortness of breath. 09/17/16  Yes Nance Pear, MD  ibuprofen (ADVIL,MOTRIN) 100 MG tablet Take 100 mg by mouth every 6 (six) hours as needed for fever.   Yes [provider]  liothyronine (CYTOMEL) 5  MCG tablet Take 2 tablets (10 mcg total) by mouth daily. 02/02/17  Yes Elayne Snare, MD  pantoprazole (PROTONIX) 40 MG tablet Take by mouth. 08/02/16 08/02/17 Yes [provider]  SYNTHROID 150 MCG tablet 1 tablet before breakfast daily and extra half on Sundays 02/05/17  Yes Elayne Snare, MD    Family History Family History  Problem Relation Age of Onset  . Hypertension Mother   . Stroke Mother   . Cancer Mother   . Goiter Mother   . Diabetes  Father   . Hypertension Father   . Hypertension Brother   . Hypertension Maternal Grandmother   . Stroke Maternal Grandmother   . Diabetes Maternal Grandmother   . Breast cancer Cousin 16       pat cousin    Social History Social History  Substance Use Topics  . Smoking status: Former Research scientist (life sciences)  . Smokeless tobacco: Never Used  . Alcohol use Yes     Comment: Occ.     Allergies   Lodine [etodolac]; Levofloxacin; Sulfur; Codeine; Escitalopram; Levaquin [levofloxacin in d5w]; Quetiapine; Seroquel [quetiapine fumarate]; Sulfa antibiotics; Zanaflex [tizanidine hcl]; Latex; and Tizanidine   Review of Systems Review of Systems  HENT: Positive for congestion and sore throat.   Cardiovascular: Negative for chest pain.  Gastrointestinal: Negative for abdominal pain.  Neurological: Negative for headaches.  All other systems reviewed and are negative.    Physical Exam Triage Vital Signs ED Triage Vitals  Enc Vitals Group     BP 03/17/17 0826 131/79     Pulse Rate 03/17/17 0826 75     Resp 03/17/17 0826 16     Temp 03/17/17 0826 97.8 F (36.6 C)     Temp Source 03/17/17 0826 Oral     SpO2 03/17/17 0826 99 %     Weight 03/17/17 0826 215 lb (97.5 kg)     Height 03/17/17 0826 5\' 5"  (1.651 m)     Head Circumference --      Peak Flow --      Pain Score 03/17/17 0824 2     Pain Loc --      Pain Edu? --      Excl. in [place]? --    No data found.   Updated Vital Signs BP 131/79 (BP Location: Left Arm)   Pulse 75   Temp 97.8 F (36.6 C) (Oral)   Resp 16   Ht 5\' 5"  (1.651 m)   Wt 215 lb (97.5 kg)   LMP 02/24/2017   SpO2 99%   BMI 35.78 kg/m   Visual Acuity Right Eye Distance:   Left Eye Distance:   Bilateral Distance:    Right Eye Near:   Left Eye Near:    Bilateral Near:     Physical Exam  Constitutional: She is oriented to person, place, and time. She appears well-developed and well-nourished. No distress.  HENT:  Head: Normocephalic and atraumatic. Not  macrocephalic.  Right Ear: Hearing, tympanic membrane, external ear and ear canal normal.  Left Ear: Hearing, tympanic membrane, external ear and ear canal normal.  Nose: Nose normal. No mucosal edema or rhinorrhea.  Mouth/Throat: Uvula is midline and mucous membranes are normal. No oral lesions. Posterior oropharyngeal erythema present. Tonsils are 0 on the right. Tonsils are 0 on the left.  Eyes: Pupils are equal, round, and reactive to light.  Neck: Normal range of motion. Neck supple.  Cardiovascular: Normal rate and regular rhythm.   Pulmonary/Chest: Effort normal.  Musculoskeletal: Normal range  of motion.  Lymphadenopathy:    She has cervical adenopathy.  Neurological: She is alert and oriented to person, place, and time.  Skin: Skin is warm. She is not diaphoretic.  Psychiatric: She has a normal mood and affect.  Vitals reviewed.    UC Treatments / Results  Labs (all labs ordered are listed, but only abnormal results are displayed) Labs Reviewed  RAPID STREP SCREEN (NOT AT [facility])  CULTURE, GROUP A STREP [facility])    EKG  EKG Interpretation None       Radiology No results found.  Procedures Procedures (including critical care time)  Medications Ordered in UC Medications  penicillin g benzathine (BICILLIN LA) 1200000 UNIT/2ML injection 1.2 Million Units (1.2 Million Units Intramuscular Given 03/17/17 0905)   Results for orders placed or performed during the hospital encounter of 03/17/17  Rapid strep screen  Result Value Ref Range   Streptococcus, Group A Screen (Direct) NEGATIVE NEGATIVE    Initial Impression / Assessment and Plan / UC Course  I have reviewed the triage vital signs and the nursing notes.  Pertinent labs & imaging results that were available during my care of the patient were reviewed by me and considered in my medical decision making (see chart for details).   her strep test was negative however her daughter strep test was positive because of  the close environment the child being out of town discussed with her and her daughter and will Mr. Pecola Leisure Bicillin 1.2 milliunits IM to both individuals for husband with her person in the entourage we will give him a prescription for amoxicillin 875 one tablet twice day for the next 10 days if he starts dropping symptoms she is agreeable with that plan and follow-up PCP if needed to 3 weeks  Final Clinical Impressions(s) / UC Diagnoses   Final diagnoses:  Pharyngitis, unspecified etiology  Exposure to strep throat    New Prescriptions Discharge Medication List as of 03/17/2017  9:43 AM       Frederich Cha, MD 03/17/17 1057

## 2017-03-20 LAB — CULTURE, GROUP A STREP (THRC)

## 2017-03-28 ENCOUNTER — Other Ambulatory Visit
Admission: RE | Admit: 2017-03-28 | Discharge: 2017-03-28 | Disposition: A | Discharge status: Home / Self Care | Payer: 59 | Source: Ambulatory Visit | Attending: Endocrinology | Admitting: Endocrinology

## 2017-03-28 DIAGNOSIS — E890 Postprocedural hypothyroidism: Secondary | ICD-10-CM | POA: Insufficient documentation

## 2017-03-28 LAB — TSH: TSH: 3.485 u[IU]/mL (ref 0.350–4.500)

## 2017-03-28 LAB — T4, FREE: Free T4: 0.86 ng/dL (ref 0.61–1.12)

## 2017-03-28 NOTE — Addendum Note (Signed)
Addended by: Memory Argue H on: 03/28/2017 01:49 PM   Modules accepted: Orders

## 2017-03-29 LAB — T3, FREE: T3, Free: 3.1 pg/mL (ref 2.0–4.4)

## 2017-04-02 ENCOUNTER — Ambulatory Visit (INDEPENDENT_AMBULATORY_CARE_PROVIDER_SITE_OTHER): Payer: 59 | Admitting: Endocrinology

## 2017-04-02 ENCOUNTER — Encounter: Payer: Self-pay | Admitting: Endocrinology

## 2017-04-02 VITALS — BP 116/76 | HR 78 | Ht 64.0 in | Wt 233.2 lb

## 2017-04-02 DIAGNOSIS — E890 Postprocedural hypothyroidism: Secondary | ICD-10-CM

## 2017-04-02 DIAGNOSIS — R635 Abnormal weight gain: Secondary | ICD-10-CM | POA: Diagnosis not present

## 2017-04-02 MED ORDER — LIOTHYRONINE SODIUM 5 MCG PO TABS: ORAL_TABLET | ORAL | 2 refills | Status: DC

## 2017-04-02 NOTE — Progress Notes (Signed)
Patient ID: Hailey [name], female   DOB: [birth year], 41 y.o.   MRN: [id number]             Referring Physician: Sabra Heck  Reason for Appointment:  Hypothyroidism, follow-up visit    History of Present Illness:    Hypothyroidism was first diagnosed in 2006 after thyroidectomy. After her thyroid surgery she was put on levothyroxine, unknown doses Subsequently she went to an endocrinologist who felt that she will do better with a combination of Cytomel and levothyroxine since she was having weight gain The patient does not think she felt any better or had any significant weight loss with this regimen but this was continued until 04/2016 She had gone to another physician for a second opinion and she was switched to Synthroid from her previous regimen of 88 g levothyroxine and 25 g of Cytomel because of her free T3 level being high at 5.8  On her initial consultation she was complaining about weight gain, joint pains and fatigue Since her TSH was 14.7 her dose was increased from the previous 150 g up to 200 g  Recent history: Since she was having significant fatigue with her 200 g Synthroid regimen she has been back on a regimen of Cytomel 10 g daily along with Synthroid 150 g daily in the morning She does think that she felt subjectively better with changing the regimen for about 2-4 weeks and had more energy However more recently she is still complaining of fatigue and lack of motivation She is still having problems with insomnia and some depression No cold intolerance Her weight has gone up 4 pounds  She is very consistent with taking her  supplements before breakfast  Patient's weight history is as follows:  Wt Readings from Last 3 Encounters:  04/02/17 233 lb 3.2 oz (105.8 kg)  03/17/17 215 lb (97.5 kg)  02/02/17 229 lb 12.8 oz (104.2 kg)    Thyroid function results have been as follows:  Lab Results  Component Value Date   FREET4 0.86 03/28/2017   FREET4 1.27 (H)  01/30/2017   FREET4 0.84 12/04/2016   TSH 3.485 03/28/2017   TSH 2.544 01/30/2017   TSH 14.71 (H) 12/04/2016   Lab Results  Component Value Date   T3FREE 3.1 03/28/2017    The following is a copy of her surgical assessment prior to her thyroid surgery:  Patient has a history of hyperthyroidism who had a dominant right thyroid nodule identified by her endocrinologist. This nodule is photopenic and ultrasound guided biopsy has demonstrated a follicular lesion with scant colloid. 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 Her endocrinologist has recommended subtotal thyroidectomy to take care of both the nodule in the right thyroid lobe as well as the hyperfunctioning left thyroid tissue  Apparently she had Hashimoto's thyroiditis in the left lobe on pathology  Past Medical History:  Diagnosis Date  . Allergic state   . Arthritis   . Asthma   . Cervical disc disease   . Depression   . GERD (gastroesophageal reflux disease)   . Glucose intolerance (impaired glucose tolerance)   . H/O colonoscopy   . HPV (human papilloma virus) infection   . Hypothyroidism   . Interstitial cystitis   . LGSIL (low grade squamous intraepithelial dysplasia)   . Paresthesia     Past Surgical History:  Procedure Laterality Date  . BREAST BIOPSY Right 10/18/2015   bx/clip- neg  .  CHOLECYSTECTOMY N/A 07/07/2016   Procedure: LAPAROSCOPIC CHOLECYSTECTOMY;  Surgeon: Olean Ree, MD;  Location: [facility] ORS;  Service: General;  Laterality: N/A;  . COLONOSCOPY WITH PROPOFOL N/A 10/11/2015   Procedure: COLONOSCOPY WITH PROPOFOL;  Surgeon: Lollie Sails, MD;  Location: [facility] ENDOSCOPY;  Service: Endoscopy;  Laterality: N/A;  . ESOPHAGOGASTRODUODENOSCOPY (EGD) WITH PROPOFOL N/A 10/11/2015   Procedure: ESOPHAGOGASTRODUODENOSCOPY (EGD) WITH PROPOFOL;  Surgeon: Lollie Sails, MD;  Location: [facility] ENDOSCOPY;   Service: Endoscopy;  Laterality: N/A;  . TONSILLECTOMY    . TOTAL THYROIDECTOMY  2005    Family History  Problem Relation Age of Onset  . Hypertension Mother   . Stroke Mother   . Cancer Mother   . Goiter Mother   . Diabetes Father   . Hypertension Father   . Hypertension Brother   . Hypertension Maternal Grandmother   . Stroke Maternal Grandmother   . Diabetes Maternal Grandmother   . Breast cancer Cousin 33       pat cousin    Social History:  reports that she has quit smoking. She has never used smokeless tobacco. She reports that she drinks alcohol. She reports that she does not use drugs.  Allergies:  Allergies  Allergen Reactions  . Lodine [Etodolac] Shortness Of Breath  . Levofloxacin Rash  . Sulfur Rash  . Codeine Other (See Comments)  . Escitalopram Other (See Comments)    Intolerant.  [place] [Levofloxacin In D5w]   . Quetiapine Other (See Comments)    Excessive sedation at 50 mg.  . Seroquel [Quetiapine Fumarate]   . Sulfa Antibiotics   . Zanaflex [Tizanidine Hcl]   . Latex Rash  . Tizanidine Rash    Allergies as of 04/02/2017      Reactions   Lodine [etodolac] Shortness Of Breath   Levofloxacin Rash   Sulfur Rash   Codeine Other (See Comments)   Escitalopram Other (See Comments)   Intolerant.   Levaquin [levofloxacin In D5w]    Quetiapine Other (See Comments)   Excessive sedation at 50 mg.   Seroquel [quetiapine Fumarate]    Sulfa Antibiotics    Zanaflex [tizanidine Hcl]    Latex Rash   Tizanidine Rash      Medication List       Accurate as of 04/02/17 11:07 AM. Always use your most recent med list.          albuterol 108 (90 Base) MCG/ACT inhaler Commonly known as:  PROVENTIL HFA;VENTOLIN HFA Inhale 2 puffs into the lungs every 6 (six) hours as needed for wheezing or shortness of breath.   ibuprofen 100 MG tablet Commonly known as:  ADVIL,MOTRIN Take 100 mg by mouth every 6 (six) hours as needed for fever.   liothyronine 5 MCG  tablet Commonly known as:  CYTOMEL Take 2 tablets (10 mcg total) by mouth daily.   pantoprazole 40 MG tablet Commonly known as:  PROTONIX Take by mouth.   SYNTHROID 150 MCG tablet Generic drug:  levothyroxine 1 tablet before breakfast daily and extra half on Sundays          Review of Systems              Examination:    BP 116/76   Pulse 78   Ht 5\' 4"  (1.626 m)   Wt 233 lb 3.2 oz (105.8 kg)   SpO2 98%   BMI 40.03 kg/m    Assessment:  HYPOTHYROIDISM, Postsurgical  Her treatment has been difficult because of her nonspecific fatigue  which is likely related to depression also  She has been on levothyroxine and liothyronine since her last visit However even though she initially felt better with his energy level she is still complaining of fatigue, decreased motivation and some weight gain again  Her free T3 level is 3.1 which is adequate However her TSH is upper normal  Weight gain: She is not motivated to exercise currently  PLAN:   She will empirically try adding another 5 g of Cytomel in the afternoon Will continue 150 g of Synthroid in the morning along with 10 g of Cytomel before breakfast also She will call to less now if she has any adverse effects with the change  Encourage her to start exercising, she can go biking with her daughter  Follow-up in 2 months  Taylah Dubiel 04/02/2017, 11:07 AM     Note: This office note was prepared with Estate agent. Any transcriptional errors that result from this process are unintentional.

## 2017-04-02 NOTE — Patient Instructions (Addendum)
3rd pill in the afternoon

## 2017-05-21 DIAGNOSIS — R351 Nocturia: Secondary | ICD-10-CM | POA: Diagnosis not present

## 2017-05-21 DIAGNOSIS — B9689 Other specified bacterial agents as the cause of diseases classified elsewhere: Secondary | ICD-10-CM | POA: Diagnosis not present

## 2017-05-21 DIAGNOSIS — N898 Other specified noninflammatory disorders of vagina: Secondary | ICD-10-CM | POA: Diagnosis not present

## 2017-05-21 DIAGNOSIS — N760 Acute vaginitis: Secondary | ICD-10-CM | POA: Diagnosis not present

## 2017-05-23 ENCOUNTER — Encounter: Payer: Self-pay | Admitting: Endocrinology

## 2017-05-28 ENCOUNTER — Ambulatory Visit: Payer: Self-pay | Admitting: Endocrinology

## 2017-06-06 DIAGNOSIS — N760 Acute vaginitis: Secondary | ICD-10-CM | POA: Diagnosis not present

## 2017-06-06 DIAGNOSIS — N898 Other specified noninflammatory disorders of vagina: Secondary | ICD-10-CM | POA: Diagnosis not present

## 2017-06-06 DIAGNOSIS — R1084 Generalized abdominal pain: Secondary | ICD-10-CM | POA: Diagnosis not present

## 2017-06-07 DIAGNOSIS — N898 Other specified noninflammatory disorders of vagina: Secondary | ICD-10-CM | POA: Diagnosis not present

## 2017-06-11 ENCOUNTER — Ambulatory Visit (INDEPENDENT_AMBULATORY_CARE_PROVIDER_SITE_OTHER): Payer: 59 | Admitting: Urology

## 2017-06-11 ENCOUNTER — Encounter: Payer: Self-pay | Admitting: Urology

## 2017-06-11 VITALS — BP 126/81 | HR 89 | Ht 65.0 in | Wt 230.0 lb

## 2017-06-11 DIAGNOSIS — R350 Frequency of micturition: Secondary | ICD-10-CM | POA: Diagnosis not present

## 2017-06-11 DIAGNOSIS — R102 Pelvic and perineal pain: Secondary | ICD-10-CM

## 2017-06-11 DIAGNOSIS — N3010 Interstitial cystitis (chronic) without hematuria: Secondary | ICD-10-CM

## 2017-06-11 DIAGNOSIS — G8929 Other chronic pain: Secondary | ICD-10-CM | POA: Diagnosis not present

## 2017-06-11 LAB — URINALYSIS, COMPLETE
Bilirubin, UA: NEGATIVE
Glucose, UA: NEGATIVE
Leukocytes, UA: NEGATIVE
Nitrite, UA: NEGATIVE
Protein, UA: NEGATIVE
RBC, UA: NEGATIVE
Specific Gravity, UA: >1.03 — ABNORMAL HIGH (ref 1.005–1.030)
Urobilinogen, Ur: 0.2 mg/dL (ref 0.2–1.0)
pH, UA: 5 (ref 5.0–7.5)

## 2017-06-11 LAB — BLADDER SCAN AMB NON-IMAGING

## 2017-06-11 LAB — MICROSCOPIC EXAMINATION
RBC, UA: NONE SEEN /hpf (ref 0–?)
WBC, UA: NONE SEEN /hpf (ref 0–?)

## 2017-06-11 MED ORDER — OXYBUTYNIN CHLORIDE 5 MG PO TABS: 5.0000 mg | ORAL_TABLET | Freq: Three times a day (TID) | ORAL | 0 refills | Status: DC | PRN

## 2017-06-11 MED ORDER — AMITRIPTYLINE HCL 25 MG PO TABS: 25.0000 mg | ORAL_TABLET | Freq: Every day | ORAL | 5 refills | Status: DC

## 2017-06-11 NOTE — Progress Notes (Signed)
06/11/2017 2:27 PM   Hailey [name] 01/30/1976 [id number]  Referring provider: Rusty Aus, MD [facility] [place], [place] [address]  Chief Complaint  Patient presents with  . Urinary Frequency    New Patient    HPI: 41 year old female referred by Dr. Ouida Sills for further evaluation of pelvic pain and urinary symptoms.  She was previously followed in our clinic by [facility]/Dr. Elnoria Howard back in 2013 for management of her interstitial cystitis and bladder overactivity. She reports that over the past 6 months, her urinary symptoms upon completely out of control and she is anxious to restart obscure with a urologist.  She reports that she has chronic lower abdominal pain and bloating. She has pain with bladder filling with some relief with voiding which quickly resumes. She reports that she has pain in her urethral area as well as bladder spasm.  She has daytime frequency but is more bothered by her nocturia up to 7 times nightly.  She also complains of bloating, nausea, and severe low back pain which comes and goes.  She has recently been seen and evaluated and treated for recurrent episodes of bacterial vaginosis and has had improvement of her symptoms after failed MetroGel followed by oral antibiotic. Her discharge has improved.  Urinalyses over the past year including on 05/21/2017 and 10/09/2016 both unremarkable. Associated urine culture negative for any UTI.  She has tried OAB medication in the past including Mybetriq, Cloyd Stagers, amongst others. She also was on suppressive Macrobid at some point. Out of all of the medications used taken the past, she did discuss with amitriptyline. She is no longer taking this medication and hasn't for several years.  She is also done well with instillations in the bladder. Based on her history, it appears that she previous he tried Elmeron with constipation.  She is aware of IC diet but has not been  following any sort of guidelines.  She does have some mild urinary leakage laughing, coughing, and sneezing. She's had one vaginal delivery. She does report a laceration at childbirth which may have been involving her urethra and clitoris but cannot recall the exact details. She became tearful talking about this episode.  She is currently sexually active without pain.  She is not perimenopausal.   She does have vaginal dryness.   Former smoker.  Based on her previous records, it does appear that she's had cystoscopy in the past.   PMH: Past Medical History:  Diagnosis Date  . Allergic state   . Arthritis   . Asthma   . Cervical disc disease   . Depression   . GERD (gastroesophageal reflux disease)   . Glucose intolerance (impaired glucose tolerance)   . H/O colonoscopy   . HPV (human papilloma virus) infection   . Hypothyroidism   . Interstitial cystitis   . LGSIL (low grade squamous intraepithelial dysplasia)   . Paresthesia     Surgical History: Past Surgical History:  Procedure Laterality Date  . BREAST BIOPSY Right 10/18/2015   bx/clip- neg  . CHOLECYSTECTOMY N/A 07/07/2016   Procedure: LAPAROSCOPIC CHOLECYSTECTOMY;  Surgeon: Olean Ree, MD;  Location: [facility] ORS;  Service: General;  Laterality: N/A;  . COLONOSCOPY WITH PROPOFOL N/A 10/11/2015   Procedure: COLONOSCOPY WITH PROPOFOL;  Surgeon: Lollie Sails, MD;  Location: [facility] ENDOSCOPY;  Service: Endoscopy;  Laterality: N/A;  . ESOPHAGOGASTRODUODENOSCOPY (EGD) WITH PROPOFOL N/A 10/11/2015   Procedure: ESOPHAGOGASTRODUODENOSCOPY (EGD) WITH PROPOFOL;  Surgeon: Lollie Sails, MD;  Location: [facility] ENDOSCOPY;  Service: Endoscopy;  Laterality: N/A;  . TONSILLECTOMY    . TOTAL THYROIDECTOMY  2005    Home Medications:  Allergies as of 06/11/2017      Reactions   Lodine [etodolac] Shortness Of Breath   Levofloxacin Rash   Sulfur Rash   Codeine Other (See Comments)   Escitalopram Other (See Comments)   Intolerant.     Levaquin [levofloxacin In D5w]    Quetiapine Other (See Comments)   Excessive sedation at 50 mg.   Seroquel [quetiapine Fumarate]    Sulfa Antibiotics    Zanaflex [tizanidine Hcl]    Latex Rash   Tizanidine Rash      Medication List       Accurate as of 06/11/17  2:27 PM. Always use your most recent med list.          albuterol 108 (90 Base) MCG/ACT inhaler Commonly known as:  PROVENTIL HFA;VENTOLIN HFA Inhale 2 puffs into the lungs every 6 (six) hours as needed for wheezing or shortness of breath.   amitriptyline 25 MG tablet Commonly known as:  ELAVIL Take 1 tablet (25 mg total) by mouth at bedtime.   ibuprofen 100 MG tablet Commonly known as:  ADVIL,MOTRIN Take 100 mg by mouth every 6 (six) hours as needed for fever.   liothyronine 5 MCG tablet Commonly known as:  CYTOMEL 2 tablets before breakfast and one tablet before lunch   oxybutynin 5 MG tablet Commonly known as:  DITROPAN Take 1 tablet (5 mg total) by mouth every 8 (eight) hours as needed for bladder spasms.   pantoprazole 40 MG tablet Commonly known as:  PROTONIX Take by mouth.   SYNTHROID 150 MCG tablet Generic drug:  levothyroxine 1 tablet before breakfast daily and extra half on Sundays       Allergies:  Allergies  Allergen Reactions  . Lodine [Etodolac] Shortness Of Breath  . Levofloxacin Rash  . Sulfur Rash  . Codeine Other (See Comments)  . Escitalopram Other (See Comments)    Intolerant.  [place] [Levofloxacin In D5w]   . Quetiapine Other (See Comments)    Excessive sedation at 50 mg.  . Seroquel [Quetiapine Fumarate]   . Sulfa Antibiotics   . Zanaflex [Tizanidine Hcl]   . Latex Rash  . Tizanidine Rash    Family History: Family History  Problem Relation Age of Onset  . Hypertension Mother   . Stroke Mother   . Cancer Mother   . Goiter Mother   . Diabetes Father   . Hypertension Father   . Hypertension Brother   . Hypertension Maternal Grandmother   . Stroke Maternal  Grandmother   . Diabetes Maternal Grandmother   . Breast cancer Cousin 71       pat cousin    Social History:  reports that she has quit smoking. She has never used smokeless tobacco. She reports that she drinks alcohol. She reports that she does not use drugs.  ROS: UROLOGY Frequent Urination?: Yes Hard to postpone urination?: No Burning/pain with urination?: Yes Get up at night to urinate?: Yes Leakage of urine?: Yes Urine stream starts and stops?: Yes Trouble starting stream?: No Do you have to strain to urinate?: Yes Blood in urine?: No Urinary tract infection?: No Sexually transmitted disease?: No Injury to kidneys or bladder?: No Painful intercourse?: No Weak stream?: Yes Currently pregnant?: No Vaginal bleeding?: No Last menstrual period?: 05-25-17  Gastrointestinal Nausea?: Yes Vomiting?: No Indigestion/heartburn?: Yes Diarrhea?: Yes Constipation?: No  Constitutional Fever: No Night sweats?: No Weight loss?: No Fatigue?: Yes  Skin Skin rash/lesions?: No Itching?: Yes  Eyes Blurred vision?: No Double vision?: No  Ears/Nose/Throat Sore throat?: No Sinus problems?: No  Hematologic/Lymphatic Swollen glands?: No Easy bruising?: No  Cardiovascular Leg swelling?: No Chest pain?: No  Respiratory Cough?: No Shortness of breath?: No  Endocrine Excessive thirst?: No  Musculoskeletal Back pain?: Yes Joint pain?: Yes  Neurological Headaches?: No Dizziness?: No  Psychologic Depression?: No Anxiety?: No  Physical Exam: BP 126/81   Pulse 89   Ht 5\' 5"  (1.651 m)   Wt 230 lb (104.3 kg)   BMI 38.27 kg/m   Constitutional:  Alert and oriented, No acute distress. HEENT: [place] AT, moist mucus membranes.  Trachea midline, no masses. Cardiovascular: No clubbing, cyanosis, or edema. Respiratory: Normal respiratory effort, no increased work of breathing. GI: Abdomen is soft, nontender, nondistended, no abdominal masses.  Obese.  GU/ Pelvic: No CVA  tenderness. Small benign-appearing lesion either granulation tissue or small urethral caruncle the 6:00 position of her and urethra. Slight hypermobility noted with Valsalva. Normal external genitalia.  Excellent anterior and apical support with Valsalva. Small stage I rectocele appreciated when examined in supine position. On bimanual exam, she has diffuse tenderness of the superior vaginal wall location of the bladder as well as bilateral levator tenderness. She is tearful during this exam.  Exam was chaperoned by Fonnie Jarvis, MA. Skin: No rashes, bruises or suspicious lesions. Neurologic: Grossly intact, no focal deficits, moving all 4 extremities. Psychiatric: Tearful and anxious.  Laboratory Data: Lab Results  Component Value Date   WBC 8.0 09/17/2016   HGB 13.3 09/17/2016   HCT 39.3 09/17/2016   MCV 87.2 09/17/2016   PLT 304 09/17/2016    Lab Results  Component Value Date   CREATININE 0.80 09/17/2016     Lab Results  Component Value Date   HGBA1C 5.5 01/26/2015    Urinalysis UA today reviewed, 1+ ketones but otherwise negative. No evidence of microscopic blood.  Pertinent Imaging: PVR today 8 mL  Assessment & Plan:    1. IC (interstitial cystitis) We discussed the diease pathophysiology is poorly understood therefore treatment has been focused primarily on symptomatic relief as well as dietary and behavioral modification. Information pamphlets were reviewed and given today discussing the current understanding of the syndrome as well as treatment options. IC dietary information also given today as many patients experience relief with simple lifestyle modifications.   In addition to above, she was given the AUA guidelines for treatment and management of IC. Expectations were reviewed as well as outline of the treatment pathway discussed.  I have highly recommended further evaluation and treatment by physical therapy for treatment of her pelvic floor dysfunction and pelvic  pain.  In addition this, we will start her back on amitriptyline 25 mg.  She would like to come in next week for a rescue solution which may help break the cycle of pain.  She is also given Ditropan 5 mg 3 times a day when necessary to help with bladder spasms.  Plan for reassessment in 6 weeks.  - Ambulatory referral to Physical Therapy  2. Urination frequency As above, no evidence of UTI or incomplete bladder emptying controlling the symptoms Urine cytology today to rule out underlying malignancy although not suspected - Bladder Scan (Post Void Residual) in office - Urinalysis, Complete  3. Chronic pelvic pain in female As above    Return in about 6 weeks (around 07/23/2017) for  recheck urinary symptoms, ALSO rescue solution nurse visit next week.  Hollice Espy, MD  [facility] [address] [place], [place] [address] [phone]  I spent 45 min with this patient of which greater than 50% was spent in counseling and coordination of care with the patient.

## 2017-06-18 ENCOUNTER — Telehealth: Payer: Self-pay | Admitting: Urology

## 2017-06-18 ENCOUNTER — Ambulatory Visit: Payer: 59

## 2017-06-18 NOTE — Telephone Encounter (Signed)
Just F.Y.I.  Pt called to cancel appt for rescue solution, says she's doing much better and will follow up as scheduled in November

## 2017-06-19 NOTE — Telephone Encounter (Signed)
Pt returned Sara's call.  I let her know that there wasn't enough urine for cytology and we will repeat the test at her appt in November per Clarise Cruz.

## 2017-06-21 ENCOUNTER — Other Ambulatory Visit: Payer: Self-pay | Admitting: Endocrinology

## 2017-06-21 DIAGNOSIS — B09 Unspecified viral infection characterized by skin and mucous membrane lesions: Secondary | ICD-10-CM | POA: Diagnosis not present

## 2017-06-29 ENCOUNTER — Ambulatory Visit (INDEPENDENT_AMBULATORY_CARE_PROVIDER_SITE_OTHER): Payer: 59

## 2017-06-29 VITALS — BP 118/83 | HR 99 | Ht 65.0 in | Wt 239.0 lb

## 2017-06-29 DIAGNOSIS — N3010 Interstitial cystitis (chronic) without hematuria: Secondary | ICD-10-CM | POA: Diagnosis not present

## 2017-06-29 MED ORDER — SODIUM BICARBONATE 8.4 % IV SOLN: 11.0000 mL | Freq: Once | INTRAVENOUS | Status: DC

## 2017-06-29 NOTE — Progress Notes (Signed)
Bladder Rescue Solution Instillation  Due to IC patient is present today for a Rescue Solution Treatment.  Patient was cleaned and prepped in a sterile fashion with betadine and lidocaine 2% jelly was instilled into the urethra.  A 16FR catheter was inserted, urine return was noted 196ml, urine was yellow in color.  Instilled a solution consisting of 49ml of Sodium Bicarb, 2 ml Lidocaine and 1 ml of Heparin. The catheter was then removed. Patient tolerated well, no complications were noted.   Performed by: Elberta Leatherwood, CMA  Follow up/ Additional Notes: w/ Dr. Erlene Quan on 11/16 OV

## 2017-06-29 NOTE — Addendum Note (Signed)
Addended by: Leia Alf on: 06/29/2017 10:22 AM   Modules accepted: Level of Service

## 2017-07-05 ENCOUNTER — Other Ambulatory Visit
Admission: RE | Admit: 2017-07-05 | Discharge: 2017-07-05 | Disposition: A | Discharge status: Home / Self Care | Payer: 59 | Source: Ambulatory Visit | Attending: Endocrinology | Admitting: Endocrinology

## 2017-07-05 DIAGNOSIS — E890 Postprocedural hypothyroidism: Secondary | ICD-10-CM | POA: Insufficient documentation

## 2017-07-05 LAB — TSH: TSH: 3.889 u[IU]/mL (ref 0.350–4.500)

## 2017-07-05 LAB — T4, FREE: Free T4: 0.89 ng/dL (ref 0.61–1.12)

## 2017-07-06 LAB — T3, FREE: T3, Free: 3.5 pg/mL (ref 2.0–4.4)

## 2017-07-09 ENCOUNTER — Other Ambulatory Visit: Payer: Self-pay | Admitting: Urology

## 2017-07-09 ENCOUNTER — Ambulatory Visit (INDEPENDENT_AMBULATORY_CARE_PROVIDER_SITE_OTHER): Payer: 59 | Admitting: Endocrinology

## 2017-07-09 ENCOUNTER — Encounter: Payer: Self-pay | Admitting: Endocrinology

## 2017-07-09 VITALS — BP 128/84 | HR 89 | Ht 64.0 in | Wt 238.4 lb

## 2017-07-09 DIAGNOSIS — E890 Postprocedural hypothyroidism: Secondary | ICD-10-CM | POA: Insufficient documentation

## 2017-07-09 NOTE — Telephone Encounter (Signed)
[facility] outpatient pharmacy called about refill for Oxybutnin

## 2017-07-09 NOTE — Progress Notes (Signed)
Patient ID: Hailey [name], female   DOB: 07-06-1976, 41 y.o.   MRN: [id number]             Referring Physician: Sabra Heck  Reason for Appointment:  Hypothyroidism, follow-up visit    History of Present Illness:    Hypothyroidism was first diagnosed in 2006 after thyroidectomy. After her thyroid surgery she was put on levothyroxine, unknown doses Subsequently she went to an endocrinologist who felt that she will do better with a combination of Cytomel and levothyroxine since she was having weight gain The patient does not think she felt any better or had any significant weight loss with this regimen but this was continued until 04/2016 She had gone to another physician for a second opinion and she was switched to Synthroid from her previous regimen of 88 g levothyroxine and 25 g of Cytomel because of her free T3 level being high at 5.8  On her initial consultation she was complaining about weight gain, joint pains and fatigue Since her TSH was 14.7 her dose was increased from the previous 150 g up to 200 g  Recent history: Since she was having significant fatigue with 200 g Synthroid alone she has been on a regimen of Cytomel daily along with Synthroid 150 g daily in the morning Although she was feeling better with using combination regimen on her last visit she was again complaining of fatigue, lack of motivation and some depression also Her TSH was upper normal  She was asked to take an extra 5 g of Cytomel at lunchtime in addition when she was seen in July She thinks that she tends to feel a little better when she adds the extra 5 g but she forgets to do this and has not done this recently She also has had other intercurrent medical problems making her feel tired and having issues with nocturia  No complaints of hair loss or cold intolerance  Her weight has gone up again and she has not exercised recently However she is planning to start at the gym  She is very consistent with  taking her  supplements before breakfast  Patient's weight history is as follows:  Wt Readings from Last 3 Encounters:  07/09/17 238 lb 6.4 oz (108.1 kg)  06/29/17 239 lb (108.4 kg)  06/11/17 230 lb (104.3 kg)    Thyroid function results have been as follows:  Lab Results  Component Value Date   FREET4 0.89 07/05/2017   FREET4 0.86 03/28/2017   FREET4 1.27 (H) 01/30/2017   TSH 3.889 07/05/2017   TSH 3.485 03/28/2017   TSH 2.544 01/30/2017   Lab Results  Component Value Date   T3FREE 3.5 07/05/2017   T3FREE 3.1 03/28/2017    The following is a copy of her surgical assessment prior to her thyroid surgery:  Patient has a history of hyperthyroidism who had a dominant right thyroid nodule identified by her endocrinologist. This nodule is photopenic and ultrasound guided biopsy has demonstrated a follicular lesion with scant colloid. 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 Her endocrinologist has recommended subtotal thyroidectomy to take care of both the nodule in the right thyroid lobe as well as the hyperfunctioning left thyroid tissue  Apparently she had Hashimoto's thyroiditis in the left lobe on pathology  Past Medical History:  Diagnosis Date  . Allergic state   . Arthritis   . Asthma   . Cervical disc disease   .  Depression   . GERD (gastroesophageal reflux disease)   . Glucose intolerance (impaired glucose tolerance)   . H/O colonoscopy   . HPV (human papilloma virus) infection   . Hypothyroidism   . Interstitial cystitis   . LGSIL (low grade squamous intraepithelial dysplasia)   . Paresthesia     Past Surgical History:  Procedure Laterality Date  . BREAST BIOPSY Right 10/18/2015   bx/clip- neg  . CHOLECYSTECTOMY N/A 07/07/2016   Procedure: LAPAROSCOPIC CHOLECYSTECTOMY;  Surgeon: Olean Ree, MD;  Location: [facility] ORS;  Service: General;  Laterality: N/A;    . COLONOSCOPY WITH PROPOFOL N/A 10/11/2015   Procedure: COLONOSCOPY WITH PROPOFOL;  Surgeon: Lollie Sails, MD;  Location: [facility] ENDOSCOPY;  Service: Endoscopy;  Laterality: N/A;  . ESOPHAGOGASTRODUODENOSCOPY (EGD) WITH PROPOFOL N/A 10/11/2015   Procedure: ESOPHAGOGASTRODUODENOSCOPY (EGD) WITH PROPOFOL;  Surgeon: Lollie Sails, MD;  Location: [facility] ENDOSCOPY;  Service: Endoscopy;  Laterality: N/A;  . TONSILLECTOMY    . TOTAL THYROIDECTOMY  2005    Family History  Problem Relation Age of Onset  . Hypertension Mother   . Stroke Mother   . Cancer Mother   . Goiter Mother   . Diabetes Father   . Hypertension Father   . Hypertension Brother   . Hypertension Maternal Grandmother   . Stroke Maternal Grandmother   . Diabetes Maternal Grandmother   . Breast cancer Cousin 22       pat cousin    Social History:  reports that she has quit smoking. She has never used smokeless tobacco. She reports that she drinks alcohol. She reports that she does not use drugs.  Allergies:  Allergies  Allergen Reactions  . Lodine [Etodolac] Shortness Of Breath  . Levofloxacin Rash  . Sulfur Rash  . Codeine Other (See Comments)  . Escitalopram Other (See Comments)    Intolerant.  [place] [Levofloxacin In D5w]   . Quetiapine Other (See Comments)    Excessive sedation at 50 mg.  . Seroquel [Quetiapine Fumarate]   . Sulfa Antibiotics   . Zanaflex [Tizanidine Hcl]   . Latex Rash  . Tizanidine Rash    Allergies as of 07/09/2017      Reactions   Lodine [etodolac] Shortness Of Breath   Levofloxacin Rash   Sulfur Rash   Codeine Other (See Comments)   Escitalopram Other (See Comments)   Intolerant.   Levaquin [levofloxacin In D5w]    Quetiapine Other (See Comments)   Excessive sedation at 50 mg.   Seroquel [quetiapine Fumarate]    Sulfa Antibiotics    Zanaflex [tizanidine Hcl]    Latex Rash   Tizanidine Rash      Medication List       Accurate as of 07/09/17  9:20 AM. Always use  your most recent med list.          albuterol 108 (90 Base) MCG/ACT inhaler Commonly known as:  PROVENTIL HFA;VENTOLIN HFA Inhale 2 puffs into the lungs every 6 (six) hours as needed for wheezing or shortness of breath.   amitriptyline 25 MG tablet Commonly known as:  ELAVIL Take 1 tablet (25 mg total) by mouth at bedtime.   ibuprofen 100 MG tablet Commonly known as:  ADVIL,MOTRIN Take 100 mg by mouth every 6 (six) hours as needed for fever.   liothyronine 5 MCG tablet Commonly known as:  CYTOMEL 2 tablets before breakfast and one tablet before lunch   oxybutynin 5 MG tablet Commonly known as:  DITROPAN Take 1 tablet (5  mg total) by mouth every 8 (eight) hours as needed for bladder spasms.   pantoprazole 40 MG tablet Commonly known as:  PROTONIX Take by mouth.   SYNTHROID 150 MCG tablet Generic drug:  levothyroxine 1 tablet before breakfast daily and extra half on Sundays   SYNTHROID 150 MCG tablet Generic drug:  levothyroxine TAKE 1 TABLET BY MOUTH ONCE DAILY BEFORE BREAKFAST AND EXTRA HALF ON SUNDAYS          Review of Systems       She is on amitriptyline for overactive bladder and interstitial cystitis         Examination:    BP 128/84   Pulse 89   Ht 5\' 4"  (1.626 m)   Wt 238 lb 6.4 oz (108.1 kg)   SpO2 97%   BMI 40.92 kg/m    Assessment:  HYPOTHYROIDISM, Postsurgical  She has been on levothyroxine and liothyronine with some improvement in her fatigue She also has several other intercurrent medical problems and some degree of depression that may be persisting  As discussed above she does feel better when she has additional 5 g of Cytomel but she forgets to do this  Her free T3 level is 3.5 which is adequate However her TSH is still upper normal  Weight gain: She will probably benefit from starting exercise program May have gained weight from Elavil  PLAN:   She will again try adding another 5 g of Cytomel in the afternoon around  lunchtime and she will need to set a reminder on her phone to do this Will continue 150 g of Synthroid in the morning along with 10 g of Cytomel before breakfast also   Follow-up in 6 months  Hailey [name] 07/09/2017, 9:20 AM     Note: This office note was prepared with Estate agent. Any transcriptional errors that result from this process are unintentional.

## 2017-07-27 ENCOUNTER — Encounter: Payer: Self-pay | Admitting: Urology

## 2017-07-27 ENCOUNTER — Ambulatory Visit: Payer: 59 | Admitting: Urology

## 2017-07-27 VITALS — BP 122/84 | HR 73 | Ht 64.0 in | Wt 238.4 lb

## 2017-07-27 DIAGNOSIS — R350 Frequency of micturition: Secondary | ICD-10-CM

## 2017-07-27 DIAGNOSIS — N3010 Interstitial cystitis (chronic) without hematuria: Secondary | ICD-10-CM

## 2017-07-27 LAB — URINALYSIS, COMPLETE
Bilirubin, UA: NEGATIVE
Glucose, UA: NEGATIVE
Ketones, UA: NEGATIVE
Leukocytes, UA: NEGATIVE
Nitrite, UA: NEGATIVE
Protein, UA: NEGATIVE
RBC, UA: NEGATIVE
Specific Gravity, UA: 1.015 (ref 1.005–1.030)
Urobilinogen, Ur: 0.2 mg/dL (ref 0.2–1.0)
pH, UA: 5.5 (ref 5.0–7.5)

## 2017-07-27 LAB — MICROSCOPIC EXAMINATION
Bacteria, UA: NONE SEEN
RBC, UA: NONE SEEN /hpf (ref 0–?)
WBC, UA: NONE SEEN /hpf (ref 0–?)

## 2017-07-27 NOTE — Progress Notes (Signed)
07/27/2017 12:17 PM   Hailey [name] September 10, 1976 [id number]  Referring provider: Rusty Aus, MD [facility] [place], [place] [address]  Chief Complaint  Patient presents with  . Urinary Frequency    6wk    HPI: 41 year old female with interstitial cystitis who presents today for routine follow-up.  She was last seen and evaluated on 06/2017 with exacerbation of her urinary symptoms.  She since been restarted on amitriptyline, prescribed Ditropan 5 mg 3 times daily for bladder spasms, as well as had one round of rescue solution.  She is also been referred to physical therapy but has yet to be evaluated.  She reports today that she is doing remarkably well.  Since resuming amitriptyline and starting Ditropan which she is only taking just before bed x1, her symptoms have nearly completely resolved.  She is quite pleased with the overall result.  She did elect to pursue the rescue solution x1 when her symptoms briefly returned but they have since resolved again.  She has been avoiding coffee, chocolate, tea, and other foods that she now recognizes her triggers.  She has been fairly busy what is unable to schedule physical therapy.  She is questioning whether she needs this now that her symptoms are well controlled.    No dysuria, hematuria, urgency, frequency, or any other concerning symptoms.   PMH: Past Medical History:  Diagnosis Date  . Allergic state   . Arthritis   . Asthma   . Cervical disc disease   . Depression   . GERD (gastroesophageal reflux disease)   . Glucose intolerance (impaired glucose tolerance)   . H/O colonoscopy   . HPV (human papilloma virus) infection   . Hypothyroidism   . Interstitial cystitis   . LGSIL (low grade squamous intraepithelial dysplasia)   . Paresthesia     Surgical History: Past Surgical History:  Procedure Laterality Date  . BREAST BIOPSY Right 10/18/2015   bx/clip- neg  . COLONOSCOPY  WITH PROPOFOL N/A 10/11/2015   Performed by Lollie Sails, MD at [facility]  . ESOPHAGOGASTRODUODENOSCOPY (EGD) WITH PROPOFOL N/A 10/11/2015   Performed by Lollie Sails, MD at [facility]  . LAPAROSCOPIC CHOLECYSTECTOMY N/A 07/07/2016   Performed by Olean Ree, MD at [facility]  . TONSILLECTOMY    . TOTAL THYROIDECTOMY  2005    Home Medications:  Allergies as of 07/27/2017      Reactions   Lodine [etodolac] Shortness Of Breath   Levofloxacin Rash   Sulfur Rash   Codeine Other (See Comments)   Escitalopram Other (See Comments)   Intolerant.   Levaquin [levofloxacin In D5w]    Quetiapine Other (See Comments)   Excessive sedation at 50 mg.   Seroquel [quetiapine Fumarate]    Sulfa Antibiotics    Zanaflex [tizanidine Hcl]    Latex Rash   Tizanidine Rash      Medication List        Accurate as of 07/27/17 11:59 PM. Always use your most recent med list.          albuterol 108 (90 Base) MCG/ACT inhaler Commonly known as:  PROVENTIL HFA;VENTOLIN HFA Inhale 2 puffs into the lungs every 6 (six) hours as needed for wheezing or shortness of breath.   amitriptyline 25 MG tablet Commonly known as:  ELAVIL Take 1 tablet (25 mg total) by mouth at bedtime.   ibuprofen 100 MG tablet Commonly known as:  ADVIL,MOTRIN Take 100 mg by mouth every  6 (six) hours as needed for fever.   liothyronine 5 MCG tablet Commonly known as:  CYTOMEL 2 tablets before breakfast and one tablet before lunch   oxybutynin 5 MG tablet Commonly known as:  DITROPAN TAKE ONE TABLET BY MOUTH EVERY 8 HOURS AS NEEDED FOR BLADDER SPASM   pantoprazole 40 MG tablet Commonly known as:  PROTONIX Take by mouth.   SYNTHROID 150 MCG tablet Generic drug:  levothyroxine 1 tablet before breakfast daily and extra half on Sundays   SYNTHROID 150 MCG tablet Generic drug:  levothyroxine TAKE 1 TABLET BY MOUTH ONCE DAILY BEFORE BREAKFAST AND EXTRA HALF ON SUNDAYS       Allergies:  Allergies    Allergen Reactions  . Lodine [Etodolac] Shortness Of Breath  . Levofloxacin Rash  . Sulfur Rash  . Codeine Other (See Comments)  . Escitalopram Other (See Comments)    Intolerant.  [place] [Levofloxacin In D5w]   . Quetiapine Other (See Comments)    Excessive sedation at 50 mg.  . Seroquel [Quetiapine Fumarate]   . Sulfa Antibiotics   . Zanaflex [Tizanidine Hcl]   . Latex Rash  . Tizanidine Rash    Family History: Family History  Problem Relation Age of Onset  . Hypertension Mother   . Stroke Mother   . Cancer Mother   . Goiter Mother   . Diabetes Father   . Hypertension Father   . Hypertension Brother   . Hypertension Maternal Grandmother   . Stroke Maternal Grandmother   . Diabetes Maternal Grandmother   . Breast cancer Cousin 19       pat cousin    Social History:  reports that she has quit smoking. she has never used smokeless tobacco. She reports that she drinks alcohol. She reports that she does not use drugs.  ROS: 12 point review of systems negative other than as per HPI.  Physical Exam: BP 122/84 (BP Location: Right Arm, Patient Position: Sitting, Cuff Size: Large)   Pulse 73   Ht 5\' 4"  (1.626 m)   Wt 238 lb 6.4 oz (108.1 kg)   BMI 40.92 kg/m   Constitutional:  Alert and oriented, No acute distress. HEENT: [place] AT, moist mucus membranes.  Trachea midline, no masses. Abdomen: Obese, nontender. Skin: No rashes, bruises or suspicious lesions. Neurologic: Grossly intact, no focal deficits, moving all 4 extremities. Psychiatric: Normal mood and affect.  Laboratory Data: Lab Results  Component Value Date   WBC 8.0 09/17/2016   HGB 13.3 09/17/2016   HCT 39.3 09/17/2016   MCV 87.2 09/17/2016   PLT 304 09/17/2016    Lab Results  Component Value Date   CREATININE 0.80 09/17/2016    Lab Results  Component Value Date   HGBA1C 5.5 01/26/2015    Urinalysis Lab Results  Component Value Date   SPECGRAV 1.015 07/27/2017   PHUR 5.5 07/27/2017    COLORU Yellow 07/27/2017   APPEARANCEUR Clear 07/27/2017   LEUKOCYTESUR Negative 07/27/2017   PROTEINUR Negative 07/27/2017   GLUCOSEU Negative 07/27/2017   KETONESU Negative 07/27/2017   RBCU Negative 07/27/2017   BILIRUBINUR Negative 07/27/2017   UUROB 0.2 07/27/2017   NITRITE Negative 07/27/2017    Lab Results  Component Value Date   LABMICR See below: 07/27/2017   WBCUA None seen 07/27/2017   RBCUA None seen 07/27/2017   LABEPIT 0-10 07/27/2017   MUCUS Present (A) 06/11/2017   BACTERIA None seen 07/27/2017    Pertinent Imaging: n/a  Assessment & Plan:  1. IC (interstitial cystitis) Well controlled on amitriptyline 25 mg, Ditropan 5 mg nightly and dietary change Continue above regimen follow-up in 1 year unless symptoms recur Advised that if she does have a flare, agree for monthly rescue solutions as needed.  If these increase to more than once per month, will need to be reevaluated sooner.  2. Urinary frequency Well controlled on Ditropan as above - Urinalysis, Complete   Return in about 1 year (around 07/27/2018) for recheck IC, may return for rescue solution monthly as needed .  Hollice Espy, MD  [facility] [address] [place], [place] [address] [phone]

## 2017-08-06 ENCOUNTER — Other Ambulatory Visit: Payer: Self-pay | Admitting: Urology

## 2017-08-07 ENCOUNTER — Telehealth: Payer: Self-pay

## 2017-08-07 NOTE — Telephone Encounter (Signed)
LMOM

## 2017-08-07 NOTE — Telephone Encounter (Signed)
-----   Message from Hollice Espy, MD sent at 08/06/2017  2:11 PM EST ----- Please let this patient know that her urine cytology was negative.  ----- Message ----- From: Delman Cheadle Sent: 08/06/2017   2:07 PM To: Hollice Espy, MD

## 2017-08-07 NOTE — Telephone Encounter (Signed)
Spoke with pt in reference to cytology results. Pt voiced understanding.

## 2017-08-10 ENCOUNTER — Other Ambulatory Visit: Payer: Self-pay | Admitting: Urology

## 2017-08-31 ENCOUNTER — Ambulatory Visit
Admission: EM | Admit: 2017-08-31 | Discharge: 2017-08-31 | Disposition: A | Discharge status: Home / Self Care | Payer: 59 | Attending: Family | Admitting: Family

## 2017-08-31 ENCOUNTER — Other Ambulatory Visit: Payer: Self-pay

## 2017-08-31 DIAGNOSIS — B49 Unspecified mycosis: Secondary | ICD-10-CM | POA: Diagnosis not present

## 2017-08-31 DIAGNOSIS — B9689 Other specified bacterial agents as the cause of diseases classified elsewhere: Secondary | ICD-10-CM

## 2017-08-31 DIAGNOSIS — R21 Rash and other nonspecific skin eruption: Secondary | ICD-10-CM | POA: Diagnosis not present

## 2017-08-31 DIAGNOSIS — L089 Local infection of the skin and subcutaneous tissue, unspecified: Secondary | ICD-10-CM | POA: Diagnosis not present

## 2017-08-31 MED ORDER — NYSTATIN 100000 UNIT/GM EX CREA: TOPICAL_CREAM | CUTANEOUS | 0 refills | Status: DC

## 2017-08-31 MED ORDER — CEPHALEXIN 500 MG PO CAPS: 500.0000 mg | ORAL_CAPSULE | Freq: Two times a day (BID) | ORAL | 0 refills | Status: AC

## 2017-08-31 NOTE — ED Triage Notes (Signed)
Patient complains of rash under right breast. Patient states that rash came up 7 days ago. Patient states that she has tried hydrocortizone, gentitan violet and multiple other OTCs.

## 2017-08-31 NOTE — ED Provider Notes (Signed)
[facility]    CSN: [id number] Arrival date & time: 08/31/17  1306     History   Chief Complaint Chief Complaint  Patient presents with  . Rash    HPI Hailey [name] is a 42 y.o. female.   41 year old female presents with rash under her right breast that started about 1 week ago. Started with slight redness and irritation medial aspect just under her breast. Also noticed a foul odor coming from under her breast. First used Hydrocortisone cream which has not helped. Then tried [place] [employer] which has discolored area so it is difficult to see lesions. She denies any distinct tingling sensation before lesions started. She also denies any breast lumps, nipple discharge or drainage coming directly from her breast. The area has remained very moist and now is cracked with a few blister-like lesions present. She is uncertain if her current bra has been making the rash worse. She has multiple allergies to antibiotics and Latex and is currently on Thyroid medication and Ditropan prn for bladder spasms.    The history is provided by the patient.    Past Medical History:  Diagnosis Date  . Allergic state   . Arthritis   . Asthma   . Cervical disc disease   . Depression   . GERD (gastroesophageal reflux disease)   . Glucose intolerance (impaired glucose tolerance)   . H/O colonoscopy   . HPV (human papilloma virus) infection   . Hypothyroidism   . Interstitial cystitis   . LGSIL (low grade squamous intraepithelial dysplasia)   . Paresthesia     Patient Active Problem List   Diagnosis Date Noted  . 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 Status post laparoscopic cholecystectomy 07/13/2016  . Interstitial cystitis 07/03/2016  . Pelvic pain in female 07/03/2016  . 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 Morbid obesity with BMI of 40.0-44.9, adult ([place]) 02/14/2016  . Polyarthralgia 02/14/2016  . GERD (gastroesophageal reflux disease) 02/10/2014     Past Surgical History:  Procedure Laterality Date  . BREAST BIOPSY Right 10/18/2015   bx/clip- neg  . CHOLECYSTECTOMY N/A 07/07/2016   Procedure: LAPAROSCOPIC CHOLECYSTECTOMY;  Surgeon: Olean Ree, MD;  Location: [facility] ORS;  Service: General;  Laterality: N/A;  . COLONOSCOPY WITH PROPOFOL N/A 10/11/2015   Procedure: COLONOSCOPY WITH PROPOFOL;  Surgeon: Lollie Sails, MD;  Location: [facility] ENDOSCOPY;  Service: Endoscopy;  Laterality: N/A;  . ESOPHAGOGASTRODUODENOSCOPY (EGD) WITH PROPOFOL N/A 10/11/2015   Procedure: ESOPHAGOGASTRODUODENOSCOPY (EGD) WITH PROPOFOL;  Surgeon: Lollie Sails, MD;  Location: [facility] ENDOSCOPY;  Service: Endoscopy;  Laterality: N/A;  . TONSILLECTOMY    . TOTAL THYROIDECTOMY  2005    OB History    No data available       Home Medications    Prior to Admission medications   Medication Sig Start Date End Date Taking? Authorizing Provider  albuterol (PROVENTIL HFA;VENTOLIN HFA) 108 (90 Base) MCG/ACT inhaler Inhale 2 puffs into the lungs every 6 (six) hours as needed for wheezing or shortness of breath. 09/17/16  Yes Nance Pear, MD  amitriptyline (ELAVIL) 25 MG tablet Take 1 tablet (25 mg total) by mouth at bedtime. 06/11/17  Yes Hollice Espy, MD  ibuprofen (ADVIL,MOTRIN) 100 MG tablet Take 100 mg by mouth every 6 (six) hours as needed for fever.   Yes [provider]  liothyronine (CYTOMEL) 5 MCG tablet 2 tablets before breakfast and one tablet before lunch 04/02/17  Yes Elayne Snare, MD  oxybutynin [facility])  5 MG tablet TAKE ONE TABLET BY MOUTH EVERY 8 HOURS AS NEEDED FOR BLADDER SPASM 08/13/17  Yes McGowan, Hunt Oris, PA-C  SYNTHROID 150 MCG tablet 1 tablet before breakfast daily and extra half on Sundays 02/05/17  Yes Elayne Snare, MD  SYNTHROID 150 MCG tablet TAKE 1 TABLET BY MOUTH ONCE DAILY BEFORE BREAKFAST AND EXTRA HALF ON SUNDAYS 06/21/17  Yes Elayne Snare, MD  cephALEXin (KEFLEX) 500 MG capsule Take 1 capsule (500 mg total) by mouth 2  (two) times daily for 10 days. 08/31/17 09/10/17  Katy Apo, NP  nystatin cream (MYCOSTATIN) Apply a small amount topically to affected area twice a day for at least 7 days. 08/31/17   Katy Apo, NP    Family History Family History  Problem Relation Age of Onset  . Hypertension Mother   . Stroke Mother   . Cancer Mother   . Goiter Mother   . Diabetes Father   . Hypertension Father   . Hypertension Brother   . Hypertension Maternal Grandmother   . Stroke Maternal Grandmother   . Diabetes Maternal Grandmother   . Breast cancer Cousin 42       pat cousin    Social History Social History   Tobacco Use  . Smoking status: Former Research scientist (life sciences)  . Smokeless tobacco: Never Used  Substance Use Topics  . Alcohol use: Yes    Comment: Occ.  . Drug use: No     Allergies   Lodine [etodolac]; Levofloxacin; Sulfur; Codeine; Escitalopram; Levaquin [levofloxacin in d5w]; Quetiapine; Seroquel [quetiapine fumarate]; Sulfa antibiotics; Zanaflex [tizanidine hcl]; Latex; and Tizanidine   Review of Systems Review of Systems  Constitutional: Negative for chills, fatigue and fever.  Respiratory: Negative for chest tightness, shortness of breath and wheezing.   Cardiovascular: Negative for chest pain.  Gastrointestinal: Negative for abdominal pain, nausea and vomiting.  Skin: Positive for color change and rash.  Neurological: Negative for dizziness, tremors, weakness, numbness and headaches.  Hematological: Negative for adenopathy. Does not bruise/bleed easily.     Physical Exam Triage Vital Signs ED Triage Vitals [08/31/17 1315]  Enc Vitals Group     BP 135/86     Pulse Rate 95     Resp 17     Temp 97.8 F (36.6 C)     Temp Source Oral     SpO2 100 %     Weight      Height      Head Circumference      Peak Flow      Pain Score      Pain Loc      Pain Edu?      Excl. in [place]?    No data found.  Updated Vital Signs BP 135/86 (BP Location: Left Arm)   Pulse 95   Temp  97.8 F (36.6 C) (Oral)   Resp 17   LMP 08/10/2017   SpO2 100%   Visual Acuity Right Eye Distance:   Left Eye Distance:   Bilateral Distance:    Right Eye Near:   Left Eye Near:    Bilateral Near:     Physical Exam  Constitutional: She is oriented to person, place, and time. She appears well-developed and well-nourished. No distress.  HENT:  Head: Normocephalic and atraumatic.  Right Ear: External ear normal.  Left Ear: External ear normal.  Eyes: Conjunctivae are normal.  Neck: Normal range of motion.  Cardiovascular: Normal rate.  Pulmonary/Chest: Effort normal. Right breast exhibits skin change. Right  breast exhibits no inverted nipple, no mass and no nipple discharge. There is no breast swelling.  A few red to purple raised lesions present just under her breast around 5 o'clock. Skin is cracked, red and tender in the area. Skin is discolored bright purple from the Gentitan Violet from sternum to mid-axillary area under her breast which impairs visualization of all the lesions and rash. A few more areas of red, irritated skin under breast, mid-clavicular area. No distinct crusting or discharge. Area is moist.     Genitourinary: There is breast tenderness. No breast discharge or bleeding.  Musculoskeletal: Normal range of motion.  Neurological: She is alert and oriented to person, place, and time.  Skin: Skin is warm. Lesion and rash noted. Rash is maculopapular. There is erythema.  Psychiatric: She has a normal mood and affect. Her behavior is normal. Judgment and thought content normal.     UC Treatments / Results  Labs (all labs ordered are listed, but only abnormal results are displayed) Labs Reviewed - No data to display  EKG  EKG Interpretation None       Radiology No results found.  Procedures Procedures (including critical care time)  Medications Ordered in UC Medications - No data to display   Initial Impression / Assessment and Plan / UC Course   I have reviewed the triage vital signs and the nursing notes.  Pertinent labs & imaging results that were available during my care of the patient were reviewed by me and considered in my medical decision making (see chart for details).    Discussed with patient various etiologies. Do not believe it is shingles at this time due to skipping of lesions and minimal pain. Also patient just recovered from shingles on her back about 3 months ago. Reviewed that she probably has a fungal infection that has developed into a superficial bacterial infection. Recommend apply Nystatin cream twice a day to affected area for at least 7 days. Try to keep area dry. Wear cotton, non-underwire bra for support and comfort. Start Keflex 500mg  twice a day for 7 to 10 days. Do not put other creams or ointments on area. Follow-up with her PCP or GYN in 7 to 10 days if not improving.    Final Clinical Impressions(s) / UC Diagnoses   Final diagnoses:  Rash  Fungal infection  Superficial bacterial skin infection    ED Discharge Orders        Ordered    nystatin cream (MYCOSTATIN)     08/31/17 1352    cephALEXin (KEFLEX) 500 MG capsule  2 times daily     08/31/17 1352       Controlled Substance Prescriptions [place] Controlled Substance Registry consulted? Not Applicable   Katy Apo, NP 09/01/17 [phone]

## 2017-08-31 NOTE — Discharge Instructions (Addendum)
Recommend apply Nystatin cream twice a day to affected area for at least 7 days. Try to keep area dry. Start Keflex 500mg  twice a day for 7 to 10 days. Do not put other creams or ointments on area. Follow-up with your PCP or GYN in 7 to 10 days if not improving.

## 2017-10-08 ENCOUNTER — Other Ambulatory Visit: Payer: Self-pay | Admitting: Endocrinology

## 2017-11-15 ENCOUNTER — Other Ambulatory Visit: Payer: Self-pay | Admitting: Endocrinology

## 2017-12-10 DIAGNOSIS — M1712 Unilateral primary osteoarthritis, left knee: Secondary | ICD-10-CM | POA: Diagnosis not present

## 2018-01-07 ENCOUNTER — Ambulatory Visit: Payer: Self-pay | Admitting: Endocrinology

## 2018-01-10 ENCOUNTER — Other Ambulatory Visit: Payer: Self-pay | Admitting: Urology

## 2018-01-10 ENCOUNTER — Telehealth: Payer: Self-pay | Admitting: Urology

## 2018-01-10 MED ORDER — AMITRIPTYLINE HCL 25 MG PO TABS: 25.0000 mg | ORAL_TABLET | Freq: Every day | ORAL | 5 refills | Status: DC

## 2018-01-10 NOTE — Telephone Encounter (Signed)
Refilled and sent to pharmacy.  Hollice Espy, MD

## 2018-01-10 NOTE — Addendum Note (Signed)
Addended by: Hollice Espy on: 01/10/2018 11:14 AM   Modules accepted: Orders

## 2018-01-10 NOTE — Telephone Encounter (Signed)
Hailey [name] is requesting a refill on her amitriptyline.  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  She may want only Dr. Erlene Quan to view her records, so I did not intrude on her privacy.  Please check with Dr. Erlene Quan to make sure her refill request is appropriate.

## 2018-01-11 ENCOUNTER — Ambulatory Visit: Payer: Self-pay | Admitting: Endocrinology

## 2018-01-17 ENCOUNTER — Other Ambulatory Visit: Payer: Self-pay | Admitting: Obstetrics and Gynecology

## 2018-01-17 DIAGNOSIS — Z1231 Encounter for screening mammogram for malignant neoplasm of breast: Secondary | ICD-10-CM

## 2018-01-22 ENCOUNTER — Ambulatory Visit
Admission: RE | Admit: 2018-01-22 | Discharge: 2018-01-22 | Disposition: A | Discharge status: Home / Self Care | Payer: 59 | Source: Ambulatory Visit | Attending: Obstetrics and Gynecology | Admitting: Obstetrics and Gynecology

## 2018-01-22 DIAGNOSIS — Z1231 Encounter for screening mammogram for malignant neoplasm of breast: Secondary | ICD-10-CM | POA: Insufficient documentation

## 2018-02-05 ENCOUNTER — Other Ambulatory Visit
Admission: RE | Admit: 2018-02-05 | Discharge: 2018-02-05 | Disposition: A | Discharge status: Home / Self Care | Payer: 59 | Source: Ambulatory Visit | Attending: Endocrinology | Admitting: Endocrinology

## 2018-02-05 DIAGNOSIS — E890 Postprocedural hypothyroidism: Secondary | ICD-10-CM | POA: Insufficient documentation

## 2018-02-05 LAB — TSH: TSH: 3.787 u[IU]/mL (ref 0.350–4.500)

## 2018-02-05 LAB — T4, FREE: Free T4: 0.92 ng/dL (ref 0.82–1.77)

## 2018-02-05 NOTE — Addendum Note (Signed)
Addended by: Milbert Coulter on: 02/05/2018 03:53 PM   Modules accepted: Orders

## 2018-02-06 LAB — T3, FREE: T3, Free: 2.9 pg/mL (ref 2.0–4.4)

## 2018-02-08 ENCOUNTER — Encounter: Payer: Self-pay | Admitting: Endocrinology

## 2018-02-08 ENCOUNTER — Ambulatory Visit: Payer: Self-pay | Admitting: Podiatry

## 2018-02-08 ENCOUNTER — Ambulatory Visit: Payer: 59 | Admitting: Endocrinology

## 2018-02-08 VITALS — BP 124/78 | HR 82 | Ht 64.0 in | Wt 221.2 lb

## 2018-02-08 DIAGNOSIS — E063 Autoimmune thyroiditis: Secondary | ICD-10-CM

## 2018-02-08 NOTE — Patient Instructions (Signed)
Take 3rd pill in afternoon

## 2018-02-08 NOTE — Progress Notes (Signed)
Patient ID: Hailey [name], female   DOB: 11/15/1975, 42 y.o.   MRN: [id number]             Referring Physician: Sabra Heck  Reason for Appointment:  Hypothyroidism, follow-up visit    History of Present Illness:    Hypothyroidism was first diagnosed in 2006 after thyroidectomy. After her thyroid surgery she was put on levothyroxine, unknown doses Subsequently she went to an endocrinologist who felt that she will do better with a combination of Cytomel and levothyroxine since she was having weight gain The patient does not think she felt any better or had any significant weight loss with this regimen but this was continued until 04/2016 She had gone to another physician for a second opinion and she was switched to Synthroid from her previous regimen of 88 g levothyroxine and 25 g of Cytomel because of her free T3 level being high at 5.8  On her initial consultation she was complaining about weight gain, joint pains and fatigue Since her TSH was 14.7 her dose was increased from the previous 150 g up to 200 g  Recent history: Since she was having significant fatigue with 200 g Synthroid alone she has been on a regimen of Cytomel daily along with Synthroid 150 g daily in the morning She has periodic symptoms of fatigue, lack of motivation and some depression  Her TSH has been upper normal since 7/18  She was asked to take an extra 5 g of Cytomel at lunchtime in addition when she was seen in July 2018 Again she forgets to do this consistently at midday and she does not like to take medication later in the day because she fears it may have helped her sleep She does not think on the day she takes her extra dose she feels any better  Overall she still has some fatigue which is not consistent and usually throughout the day She has lost weight from exercising although recently has slowed down No recent increase in mood swings Recently her joint pains appear to be better  She is very  consistent with taking her morning supplements before breakfast  Patient's weight history is as follows:  Wt Readings from Last 3 Encounters:  02/08/18 221 lb 3.2 oz (100.3 kg)  07/27/17 238 lb 6.4 oz (108.1 kg)  07/09/17 238 lb 6.4 oz (108.1 kg)    Thyroid function results have been as follows:  Lab Results  Component Value Date   FREET4 0.92 02/05/2018   FREET4 0.89 07/05/2017   FREET4 0.86 03/28/2017   TSH 3.787 02/05/2018   TSH 3.889 07/05/2017   TSH 3.485 03/28/2017   Lab Results  Component Value Date   T3FREE 2.9 02/05/2018   T3FREE 3.5 07/05/2017   T3FREE 3.1 03/28/2017    The following is a copy of her surgical assessment prior to her thyroid surgery:  Patient has a history of hyperthyroidism who had a dominant right thyroid nodule identified by her endocrinologist. This nodule is photopenic and ultrasound guided biopsy has demonstrated a follicular lesion with scant colloid. 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 Her endocrinologist has recommended subtotal thyroidectomy to take care of both the nodule in the right thyroid lobe as well as the hyperfunctioning left thyroid tissue  Apparently she had Hashimoto's thyroiditis in the left lobe on pathology  Past Medical History:  Diagnosis Date  . Allergic state   . Arthritis   .  Asthma   . Cervical disc disease   . Depression   . GERD (gastroesophageal reflux disease)   . Glucose intolerance (impaired glucose tolerance)   . H/O colonoscopy   . HPV (human papilloma virus) infection   . Hypothyroidism   . Interstitial cystitis   . LGSIL (low grade squamous intraepithelial dysplasia)   . Paresthesia     Past Surgical History:  Procedure Laterality Date  . BREAST BIOPSY Right 10/18/2015   bx/clip- neg  . CHOLECYSTECTOMY N/A 07/07/2016   Procedure: LAPAROSCOPIC CHOLECYSTECTOMY;  Surgeon: Olean Ree, MD;   Location: [facility] ORS;  Service: General;  Laterality: N/A;  . COLONOSCOPY WITH PROPOFOL N/A 10/11/2015   Procedure: COLONOSCOPY WITH PROPOFOL;  Surgeon: Lollie Sails, MD;  Location: [facility] ENDOSCOPY;  Service: Endoscopy;  Laterality: N/A;  . ESOPHAGOGASTRODUODENOSCOPY (EGD) WITH PROPOFOL N/A 10/11/2015   Procedure: ESOPHAGOGASTRODUODENOSCOPY (EGD) WITH PROPOFOL;  Surgeon: Lollie Sails, MD;  Location: [facility] ENDOSCOPY;  Service: Endoscopy;  Laterality: N/A;  . TONSILLECTOMY    . TOTAL THYROIDECTOMY  2005    Family History  Problem Relation Age of Onset  . Hypertension Mother   . Stroke Mother   . Cancer Mother   . Goiter Mother   . Diabetes Father   . Hypertension Father   . Hypertension Brother   . Hypertension Maternal Grandmother   . Stroke Maternal Grandmother   . Diabetes Maternal Grandmother   . Breast cancer Cousin 80       pat cousin    Social History:  reports that she has quit smoking. She has never used smokeless tobacco. She reports that she drinks alcohol. She reports that she does not use drugs.  Allergies:  Allergies  Allergen Reactions  . Lodine [Etodolac] Shortness Of Breath  . Levofloxacin Rash  . Sulfur Rash  . Codeine Other (See Comments)  . Escitalopram Other (See Comments)    Intolerant.  [place] [Levofloxacin In D5w]   . Quetiapine Other (See Comments)    Excessive sedation at 50 mg.  . Seroquel [Quetiapine Fumarate]   . Sulfa Antibiotics   . Zanaflex [Tizanidine Hcl]   . Latex Rash  . Tizanidine Rash    Allergies as of 02/08/2018      Reactions   Lodine [etodolac] Shortness Of Breath   Levofloxacin Rash   Sulfur Rash   Codeine Other (See Comments)   Escitalopram Other (See Comments)   Intolerant.   Levaquin [levofloxacin In D5w]    Quetiapine Other (See Comments)   Excessive sedation at 50 mg.   Seroquel [quetiapine Fumarate]    Sulfa Antibiotics    Zanaflex [tizanidine Hcl]    Latex Rash   Tizanidine Rash      Medication List         Accurate as of 02/08/18 11:36 AM. Always use your most recent med list.          albuterol 108 (90 Base) MCG/ACT inhaler Commonly known as:  PROVENTIL HFA;VENTOLIN HFA Inhale 2 puffs into the lungs every 6 (six) hours as needed for wheezing or shortness of breath.   amitriptyline 25 MG tablet Commonly known as:  ELAVIL Take 1 tablet (25 mg total) by mouth at bedtime.   ibuprofen 100 MG tablet Commonly known as:  ADVIL,MOTRIN Take 100 mg by mouth every 6 (six) hours as needed for fever.   liothyronine 5 MCG tablet Commonly known as:  CYTOMEL TAKE 2 TABLETS BEFORE BREAKFAST AND ONE TABLET BEFORE LUNCH   nystatin cream Commonly  known as:  MYCOSTATIN Apply a small amount topically to affected area twice a day for at least 7 days.   oxybutynin 5 MG tablet Commonly known as:  DITROPAN TAKE ONE TABLET BY MOUTH EVERY 8 HOURS AS NEEDED FOR BLADDER SPASM   SYNTHROID 150 MCG tablet Generic drug:  levothyroxine 1 tablet before breakfast daily and extra half on Sundays   SYNTHROID 150 MCG tablet Generic drug:  levothyroxine TAKE 1 TABLET BY MOUTH ONCE DAILY BEFORE BREAKFAST AND EXTRA HALF ON SUNDAYS          Review of Systems       She is on amitriptyline for overactive bladder and interstitial cystitis Has history of depression in the past        Examination:    BP 124/78 (BP Location: Left Arm, Patient Position: Sitting, Cuff Size: Normal)   Pulse 82   Ht 5\' 4"  (1.626 m)   Wt 221 lb 3.2 oz (100.3 kg)   SpO2 98%   BMI 37.97 kg/m   Thyroid exam normal Biceps reflexes appear normal  Assessment:  HYPOTHYROIDISM, Postsurgical  She has been on levothyroxine and liothyronine  She tends to have persistent fatigue regardless of her thyroid levels and has occasional issues with insomnia and mild depression also  Currently her TSH is still upper normal as before and free T3 has gone down slightly She has not been taking the third tablet of Cytomel later in the  day as recommended because of fear of causing insomnia  Mild obesity: Advised her to be consistent with her exercise weight loss   PLAN:   Reassured her that if she takes her third tablet of Cytomel in the afternoon that should not cause insomnia She will try to do this regularly No change in levothyroxine/Synthroid  She may need to follow-up with her PCP regarding other ongoing problems especially if her fatigue continues  Follow-up in 6 months again  Elayne Snare 02/08/2018, 11:36 AM     Note: This office note was prepared with Dragon voice recognition system technology. Any transcriptional errors that result from this process are unintentional.

## 2018-02-15 ENCOUNTER — Ambulatory Visit: Payer: Self-pay | Admitting: Podiatry

## 2018-02-26 ENCOUNTER — Other Ambulatory Visit: Payer: Self-pay | Admitting: Endocrinology

## 2018-03-29 ENCOUNTER — Ambulatory Visit: Payer: 59 | Admitting: Internal Medicine

## 2018-04-05 ENCOUNTER — Other Ambulatory Visit: Payer: Self-pay | Admitting: Endocrinology

## 2018-04-07 ENCOUNTER — Ambulatory Visit
Admission: EM | Admit: 2018-04-07 | Discharge: 2018-04-07 | Disposition: A | Discharge status: Home / Self Care | Payer: 59 | Attending: Family Medicine | Admitting: Family Medicine

## 2018-04-07 ENCOUNTER — Other Ambulatory Visit: Payer: Self-pay

## 2018-04-07 ENCOUNTER — Encounter: Payer: Self-pay | Admitting: Emergency Medicine

## 2018-04-07 DIAGNOSIS — N898 Other specified noninflammatory disorders of vagina: Secondary | ICD-10-CM

## 2018-04-07 DIAGNOSIS — R300 Dysuria: Secondary | ICD-10-CM | POA: Diagnosis not present

## 2018-04-07 DIAGNOSIS — N760 Acute vaginitis: Secondary | ICD-10-CM

## 2018-04-07 LAB — URINALYSIS, COMPLETE (UACMP) WITH MICROSCOPIC
Glucose, UA: NEGATIVE mg/dL
Hgb urine dipstick: NEGATIVE
Leukocytes, UA: NEGATIVE
Nitrite: NEGATIVE
Protein, ur: NEGATIVE mg/dL
Specific Gravity, Urine: >1.03 — ABNORMAL HIGH (ref 1.005–1.030)
pH: 5.5 (ref 5.0–8.0)

## 2018-04-07 LAB — WET PREP, GENITAL
Clue Cells Wet Prep HPF POC: NONE SEEN
Sperm: NONE SEEN
Trich, Wet Prep: NONE SEEN
Yeast Wet Prep HPF POC: NONE SEEN

## 2018-04-07 MED ORDER — NITROFURANTOIN MONOHYD MACRO 100 MG PO CAPS: 100.0000 mg | ORAL_CAPSULE | Freq: Two times a day (BID) | ORAL | 0 refills | Status: DC

## 2018-04-07 MED ORDER — TINIDAZOLE 500 MG PO TABS: 2.0000 g | ORAL_TABLET | Freq: Every day | ORAL | 0 refills | Status: AC

## 2018-04-07 MED ORDER — FLUCONAZOLE 150 MG PO TABS: 150.0000 mg | ORAL_TABLET | Freq: Every day | ORAL | 0 refills | Status: DC

## 2018-04-07 NOTE — ED Provider Notes (Addendum)
[facility] ____________________________________________  Time seen: Approximately 10:00 AM  I have reviewed the triage vital signs and the nursing notes.   HISTORY  Chief Complaint Abdominal Pain  HPI Hailey [name] is a 42 y.o. female presenting for complaints of vaginal irritation that started on Friday.  Patient reports symptoms started mild on Friday and she took one Diflucan, making note that the Diflucan was definitely out of date.  Patient had the vaginal irritation continued with some accompanying vaginal itching and very minimal discharge.  Also reports since yesterday having lower abdominal mild discomfort that comes in waves as well as intermittent nausea.  Patient reports that she has a history of recurrent bacterial vaginosis with same presentation.  States she does also have a history of UTIs and yeast infections, but states current complaints seem more typical with her BV.  No vomiting, no diarrhea.Some burning with urination, but no urinary frequency.  Reports continues with normal bowel movements.  Denies fevers.  Denies known sick contacts.  Patient reports that she has recently changed detergents in her home as well as she uses a pain liner due to her cystitis and unsure if that caused some irritation as well.  Denies any concerns of STDs.  Denies any lesions or sores.  Denies internal vaginal pain.  Also tried yesterday some MetroGel vaginally without change.  Denies other aggravating or alleviating factors.  Denies recent antibiotic use.   Rusty Aus, MD: PCP Jefm BryantHarle Battiest   Past Medical History:  Diagnosis Date  . Allergic state   . Arthritis   . Asthma   . Cervical disc disease   . Depression   . GERD (gastroesophageal reflux disease)   . Glucose intolerance (impaired glucose tolerance)   . H/O colonoscopy   . HPV (human papilloma virus) infection   . Hypothyroidism   . Interstitial cystitis   . LGSIL (low grade squamous intraepithelial  dysplasia)   . Paresthesia     Patient Active Problem List   Diagnosis Date Noted  . 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 Status post laparoscopic cholecystectomy 07/13/2016  . Interstitial cystitis 07/03/2016  . Pelvic pain in female 07/03/2016  . 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 Morbid obesity with BMI of 40.0-44.9, adult ([place]) 02/14/2016  . Polyarthralgia 02/14/2016  . GERD (gastroesophageal reflux disease) 02/10/2014    Past Surgical History:  Procedure Laterality Date  . BREAST BIOPSY Right 10/18/2015   bx/clip- neg  . CHOLECYSTECTOMY N/A 07/07/2016   Procedure: LAPAROSCOPIC CHOLECYSTECTOMY;  Surgeon: Olean Ree, MD;  Location: [facility] ORS;  Service: General;  Laterality: N/A;  . COLONOSCOPY WITH PROPOFOL N/A 10/11/2015   Procedure: COLONOSCOPY WITH PROPOFOL;  Surgeon: Lollie Sails, MD;  Location: [facility] ENDOSCOPY;  Service: Endoscopy;  Laterality: N/A;  . ESOPHAGOGASTRODUODENOSCOPY (EGD) WITH PROPOFOL N/A 10/11/2015   Procedure: ESOPHAGOGASTRODUODENOSCOPY (EGD) WITH PROPOFOL;  Surgeon: Lollie Sails, MD;  Location[facility] ENDOSCOPY;  Service: Endoscopy;  Laterality: N/A;  . TONSILLECTOMY    . TOTAL THYROIDECTOMY  2005      Current Facility-Administered Medications:  .  heparin-lidocaine-sod bicarb bladder irrigation (Bladder rescue for bladder instillation procedures), 11 mL, Irrigation, Once, Hollice Espy, MD  Current Outpatient Medications:  .  albuterol (PROVENTIL HFA;VENTOLIN HFA) 108 (90 Base) MCG/ACT inhaler, Inhale 2 puffs into the lungs every 6 (six) hours as needed for wheezing or shortness of breath., Disp: 1 Inhaler, Rfl: 0 .  ibuprofen (ADVIL,MOTRIN) 100 MG tablet, Take 100 mg by mouth every 6 (six) hours as  needed for fever., Disp: , Rfl:  .  liothyronine (CYTOMEL) 5 MCG tablet, TAKE 2 TABLETS BEFORE BREAKFAST AND ONE TABLET BEFORE LUNCH, Disp: 90 tablet, Rfl: 2 .  nystatin cream (MYCOSTATIN), Apply a small amount topically to affected  area twice a day for at least 7 days., Disp: 30 g, Rfl: 0 .  SYNTHROID 150 MCG tablet, 1 tablet before breakfast daily and extra half on Sundays, Disp: 32 tablet, Rfl: 3 .  fluconazole (DIFLUCAN) 150 MG tablet, Take 1 tablet (150 mg total) by mouth daily. Take one pill orally, then Repeat in 72 hrs as needed., Disp: 2 tablet, Rfl: 0 .  nitrofurantoin, macrocrystal-monohydrate, (MACROBID) 100 MG capsule, Take 1 capsule (100 mg total) by mouth 2 (two) times daily., Disp: 10 capsule, Rfl: 0 .  oxybutynin (DITROPAN) 5 MG tablet, TAKE ONE TABLET BY MOUTH EVERY 8 HOURS AS NEEDED FOR BLADDER SPASM, Disp: 30 tablet, Rfl: 6 .  SYNTHROID 150 MCG tablet, TAKE 1 TABLET BY MOUTH ONCE DAILY BEFORE BREAKFAST AND EXTRA HALF ON SUNDAYS, Disp: 32 tablet, Rfl: 3 .  tinidazole (TINDAMAX) 500 MG tablet, Take 4 tablets (2,000 mg total) by mouth daily for 2 days., Disp: 8 tablet, Rfl: 0  Allergies Lodine [etodolac]; Levofloxacin; Sulfur; Codeine; Escitalopram; Levaquin [levofloxacin in d5w]; Quetiapine; Seroquel [quetiapine fumarate]; Sulfa antibiotics; Zanaflex [tizanidine hcl]; Latex; and Tizanidine  Family History  Problem Relation Age of Onset  . Hypertension Mother   . Stroke Mother   . Cancer Mother   . Goiter Mother   . Diabetes Father   . Hypertension Father   . Hypertension Brother   . Hypertension Maternal Grandmother   . Stroke Maternal Grandmother   . Diabetes Maternal Grandmother   . Breast cancer Cousin 38       pat cousin    Social History Social History   Tobacco Use  . Smoking status: Former Research scientist (life sciences)  . Smokeless tobacco: Never Used  Substance Use Topics  . Alcohol use: Yes    Comment: Occ.  . Drug use: No    Review of Systems Constitutional: No fever/chills Cardiovascular: Denies chest pain. Respiratory: Denies shortness of breath. Gastrointestinal: No abdominal pain. no vomiting.  No diarrhea.  No constipation. Genitourinary: As above Musculoskeletal: Negative for back  pain. Skin: Negative for rash.  ____________________________________________   PHYSICAL EXAM:  VITAL SIGNS: ED Triage Vitals  Enc Vitals Group     BP 04/07/18 0851 123/81     Pulse Rate 04/07/18 0851 64     Resp 04/07/18 0851 18     Temp 04/07/18 0851 98 F (36.7 C)     Temp Source 04/07/18 0851 Oral     SpO2 04/07/18 0851 99 %     Weight 04/07/18 0843 220 lb (99.8 kg)     Height 04/07/18 0843 5\' 4"  (1.626 m)     Head Circumference --      Peak Flow --      Pain Score 04/07/18 0842 5     Pain Loc --      Pain Edu? --      Excl. in [place]? --     Constitutional: Alert and oriented. Well appearing and in no acute distress. ENT      Head: Normocephalic and atraumatic. Cardiovascular: Normal rate, regular rhythm. Grossly normal heart sounds.  Good peripheral circulation. Respiratory: Normal respiratory effort without tachypnea nor retractions. Breath sounds are clear and equal bilaterally. No wheezes, rales, rhonchi. Gastrointestinal:  Normal Bowel sounds. Minimal epigastric tenderness. Mild  midline suprapubic tenderness. Abdomen otherwise soft and nontender. No CVA tenderness. Musculoskeletal: No midline cervical, thoracic or lumbar tenderness to palpation. Neurologic:  Normal speech and language.Speech is normal. No gait instability.  Skin:  Skin is warm, dry and intact. No rash noted. Psychiatric: Mood and affect are normal. Speech and behavior are normal. Patient exhibits appropriate insight and judgment   ___________________________________________   LABS (all labs ordered are listed, but only abnormal results are displayed)  Labs Reviewed  WET PREP, GENITAL - Abnormal; Notable for the following components:      Result Value   WBC, Wet Prep HPF POC FEW (*)    All other components within normal limits  URINALYSIS, COMPLETE (UACMP) WITH MICROSCOPIC - Abnormal; Notable for the following components:   Color, Urine AMBER (*)    Specific Gravity, Urine >1.030 (*)     Bilirubin Urine SMALL (*)    Ketones, ur TRACE (*)    Bacteria, UA RARE (*)    All other components within normal limits  URINE CULTURE   ____________________________________________   PROCEDURES Procedures   INITIAL IMPRESSION / ASSESSMENT AND PLAN / ED COURSE  Pertinent labs & imaging results that were available during my care of the patient were reviewed by me and considered in my medical decision making (see chart for details).  Well-appearing patient.  No acute stress.  Patient with history of recurrent bacterial vaginosis, per patient with similar presentation.  Complaining of vaginal irritation and vaginal rawness.  Urinalysis reviewed, discussed not clear UTI, will culture and empirically start on oral Macrobid, as she reports tolerates well.  Patient elected for self wet prep, but expressed concern when result did not show yeast or bacterial vaginosis.  However patient declined pelvic exam at this time, and patient verbalized understanding of this.  Will empirically treat with oral Tindamax and as needed Diflucan.  Patient states that she will follow-up with her OB/GYN this week.  Discussed strict follow-up and return parameters sooner. Discussed indication, risks and benefits of medications with patient.  Discussed follow up with Primary care physician this week as needed. Discussed follow up and return parameters including no resolution or any worsening concerns. Patient verbalized understanding and agreed to plan.   ____________________________________________   FINAL CLINICAL IMPRESSION(S) / ED DIAGNOSES  Final diagnoses:  Vaginal irritation  Dysuria  Vaginitis and vulvovaginitis     ED Discharge Orders        Ordered    nitrofurantoin, macrocrystal-monohydrate, (MACROBID) 100 MG capsule  2 times daily     04/07/18 1006    fluconazole (DIFLUCAN) 150 MG tablet  Daily     04/07/18 1006    tinidazole (TINDAMAX) 500 MG tablet  Daily     04/07/18 1006       Note:  This dictation was prepared with Dragon dictation along with smaller phrase technology. Any transcriptional errors that result from this process are unintentional.         Marylene Land, NP 04/07/18 1043    Marylene Land, NP 04/07/18 1044

## 2018-04-07 NOTE — Discharge Instructions (Signed)
Take medication as prescribed. Rest. Drink plenty of fluids.   Follow up closely with your OBGYN this week as discussed.   Follow up with your primary care physician this week as needed. Return to Urgent care for new or worsening concerns.

## 2018-04-07 NOTE — ED Triage Notes (Signed)
Patient c/o abdominal pain, nausea and itching that started Friday. She took a Diflucan on Friday and 1 treatment of Metrogel with no relief.

## 2018-04-08 DIAGNOSIS — N760 Acute vaginitis: Secondary | ICD-10-CM | POA: Diagnosis not present

## 2018-04-08 DIAGNOSIS — R1011 Right upper quadrant pain: Secondary | ICD-10-CM | POA: Diagnosis not present

## 2018-04-08 LAB — URINE CULTURE: Culture: NO GROWTH

## 2018-04-12 ENCOUNTER — Other Ambulatory Visit: Payer: Self-pay | Admitting: Physician Assistant

## 2018-04-12 ENCOUNTER — Ambulatory Visit
Admission: RE | Admit: 2018-04-12 | Discharge: 2018-04-12 | Disposition: A | Discharge status: Home / Self Care | Payer: 59 | Source: Ambulatory Visit | Attending: Physician Assistant | Admitting: Physician Assistant

## 2018-04-12 DIAGNOSIS — R100 Acute abdomen: Secondary | ICD-10-CM | POA: Insufficient documentation

## 2018-04-12 DIAGNOSIS — R109 Unspecified abdominal pain: Secondary | ICD-10-CM | POA: Diagnosis not present

## 2018-04-12 MED ORDER — IOPAMIDOL (ISOVUE-300) INJECTION 61%
100.0000 mL | Freq: Once | INTRAVENOUS | Status: AC | PRN
  Administered 2018-04-12: 100 mL via INTRAVENOUS

## 2018-04-22 DIAGNOSIS — G2581 Restless legs syndrome: Secondary | ICD-10-CM | POA: Diagnosis not present

## 2018-04-22 DIAGNOSIS — Z124 Encounter for screening for malignant neoplasm of cervix: Secondary | ICD-10-CM | POA: Diagnosis not present

## 2018-04-22 DIAGNOSIS — Z01419 Encounter for gynecological examination (general) (routine) without abnormal findings: Secondary | ICD-10-CM | POA: Diagnosis not present

## 2018-04-22 DIAGNOSIS — R1013 Epigastric pain: Secondary | ICD-10-CM | POA: Diagnosis not present

## 2018-04-22 DIAGNOSIS — Z1151 Encounter for screening for human papillomavirus (HPV): Secondary | ICD-10-CM | POA: Diagnosis not present

## 2018-04-22 LAB — HM PAP SMEAR: HM Pap smear: NORMAL

## 2018-08-01 ENCOUNTER — Other Ambulatory Visit
Admission: RE | Admit: 2018-08-01 | Discharge: 2018-08-01 | Disposition: A | Discharge status: Home / Self Care | Payer: 59 | Source: Ambulatory Visit | Attending: Endocrinology | Admitting: Endocrinology

## 2018-08-01 DIAGNOSIS — E063 Autoimmune thyroiditis: Secondary | ICD-10-CM | POA: Insufficient documentation

## 2018-08-01 LAB — T4, FREE: Free T4: 0.81 ng/dL — ABNORMAL LOW (ref 0.82–1.77)

## 2018-08-01 LAB — TSH: TSH: 1.593 u[IU]/mL (ref 0.350–4.500)

## 2018-08-01 NOTE — Addendum Note (Signed)
Addended by: Memory Argue H on: 08/01/2018 01:17 PM   Modules accepted: Orders

## 2018-08-02 LAB — T3, FREE: T3, Free: 3.8 pg/mL (ref 2.0–4.4)

## 2018-08-05 ENCOUNTER — Other Ambulatory Visit: Payer: Self-pay

## 2018-08-05 ENCOUNTER — Encounter: Payer: Self-pay | Admitting: Endocrinology

## 2018-08-05 ENCOUNTER — Ambulatory Visit: Payer: 59 | Admitting: Endocrinology

## 2018-08-05 VITALS — BP 110/74 | HR 68 | Ht 64.0 in | Wt 230.6 lb

## 2018-08-05 DIAGNOSIS — E890 Postprocedural hypothyroidism: Secondary | ICD-10-CM

## 2018-08-05 MED ORDER — LIOTHYRONINE SODIUM 5 MCG PO TABS: ORAL_TABLET | ORAL | 2 refills | Status: DC

## 2018-08-05 MED ORDER — SYNTHROID 150 MCG PO TABS: ORAL_TABLET | ORAL | 3 refills | Status: DC

## 2018-08-05 NOTE — Progress Notes (Signed)
Patient ID: Hailey [name], female   DOB: Apr 25, 1976, 42 y.o.   MRN: [id number]             Referring Physician: Sabra Heck  Reason for Appointment:  Hypothyroidism, follow-up visit    History of Present Illness:    Hypothyroidism was first diagnosed in 2006 after thyroidectomy. After her thyroid surgery she was put on levothyroxine, unknown doses Subsequently she went to an endocrinologist who felt that she will do better with a combination of Cytomel and levothyroxine since she was having weight gain The patient does not think she felt any better or had any significant weight loss with this regimen but this was continued until 04/2016 She had gone to another physician for a second opinion and she was switched to Synthroid from her previous regimen of 88 g levothyroxine and 25 g of Cytomel because of her free T3 level being high at 5.8  On her initial consultation she was complaining about weight gain, joint pains and fatigue Since her TSH was 14.7 her dose was increased from the previous 150 g up to 200 g  Recent history: Since she was having significant fatigue with 200 g Synthroid alone she has been on a regimen of Cytomel daily along with Synthroid 150 g daily  She has done better subjectively with this compared to Synthroid alone  She has some persistent symptoms of fatigue, lack of motivation and some depression but this fluctuates  She was asked to take an extra 5 g of Cytomel at lunchtime in addition when she was seen in July 2018 since she had upper normal TSH and her symptoms were persistent  Again she forgets to do this consistently but when she does take the extra Cytomel she does feel a little more energy She has better energy level when she exercises regularly but has not done this for about a month now Her weight has gone up from lack of exercise also  She is very consistent with taking her thyroid supplements before breakfast  Her TSH is now better at 1.6 and free  T3 is upper normal at 3.8  Patient's weight history is as follows:  Wt Readings from Last 3 Encounters:  08/05/18 230 lb 9.6 oz (104.6 kg)  04/07/18 220 lb (99.8 kg)  02/08/18 221 lb 3.2 oz (100.3 kg)    Thyroid function results have been as follows:  Lab Results  Component Value Date   FREET4 0.81 (L) 08/01/2018   FREET4 0.92 02/05/2018   FREET4 0.89 07/05/2017   TSH 1.593 08/01/2018   TSH 3.787 02/05/2018   TSH 3.889 07/05/2017   Lab Results  Component Value Date   T3FREE 3.8 08/01/2018   T3FREE 2.9 02/05/2018   T3FREE 3.5 07/05/2017   T3FREE 3.1 03/28/2017    The following is a copy of her surgical assessment prior to her thyroid surgery:  Patient has a history of hyperthyroidism who had a dominant right thyroid nodule identified by her endocrinologist. This nodule is photopenic and ultrasound guided biopsy has demonstrated a follicular lesion with scant colloid. 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 Her endocrinologist has recommended subtotal thyroidectomy to take care of both the nodule in the right thyroid lobe as well as the hyperfunctioning left thyroid tissue  Apparently she had Hashimoto's thyroiditis in the left lobe on pathology  Past Medical History:  Diagnosis Date  . Allergic state   . Arthritis   .  Asthma   . Cervical disc disease   . Depression   . GERD (gastroesophageal reflux disease)   . Glucose intolerance (impaired glucose tolerance)   . H/O colonoscopy   . HPV (human papilloma virus) infection   . Hypothyroidism   . Interstitial cystitis   . LGSIL (low grade squamous intraepithelial dysplasia)   . Paresthesia     Past Surgical History:  Procedure Laterality Date  . BREAST BIOPSY Right 10/18/2015   bx/clip- neg  . CHOLECYSTECTOMY N/A 07/07/2016   Procedure: LAPAROSCOPIC CHOLECYSTECTOMY;  Surgeon: Olean Ree, MD;  Location: [facility]  ORS;  Service: General;  Laterality: N/A;  . COLONOSCOPY WITH PROPOFOL N/A 10/11/2015   Procedure: COLONOSCOPY WITH PROPOFOL;  Surgeon: Lollie Sails, MD;  Location: [facility] ENDOSCOPY;  Service: Endoscopy;  Laterality: N/A;  . ESOPHAGOGASTRODUODENOSCOPY (EGD) WITH PROPOFOL N/A 10/11/2015   Procedure: ESOPHAGOGASTRODUODENOSCOPY (EGD) WITH PROPOFOL;  Surgeon: Lollie Sails, MD;  Location: [facility] ENDOSCOPY;  Service: Endoscopy;  Laterality: N/A;  . TONSILLECTOMY    . TOTAL THYROIDECTOMY  2005    Family History  Problem Relation Age of Onset  . Hypertension Mother   . Stroke Mother   . Cancer Mother   . Goiter Mother   . Diabetes Father   . Hypertension Father   . Hypertension Brother   . Hypertension Maternal Grandmother   . Stroke Maternal Grandmother   . Diabetes Maternal Grandmother   . Breast cancer Cousin 9       pat cousin    Social History:  reports that she has quit smoking. She has never used smokeless tobacco. She reports that she drinks alcohol. She reports that she does not use drugs.  Allergies:  Allergies  Allergen Reactions  . Lodine [Etodolac] Shortness Of Breath  . Levofloxacin Rash  . Sulfur Rash  . Codeine Other (See Comments)  . Escitalopram Other (See Comments)    Intolerant.  Mack Hook [Levofloxacin In D5w]   . Quetiapine Other (See Comments)    Excessive sedation at 50 mg.  . Seroquel [Quetiapine Fumarate]   . Sulfa Antibiotics   . Zanaflex [Tizanidine Hcl]   . Latex Rash  . Tizanidine Rash    Allergies as of 08/05/2018      Reactions   Lodine [etodolac] Shortness Of Breath   Levofloxacin Rash   Sulfur Rash   Codeine Other (See Comments)   Escitalopram Other (See Comments)   Intolerant.   Levaquin [levofloxacin In D5w]    Quetiapine Other (See Comments)   Excessive sedation at 50 mg.   Seroquel [quetiapine Fumarate]    Sulfa Antibiotics    Zanaflex [tizanidine Hcl]    Latex Rash   Tizanidine Rash      Medication List         Accurate as of 08/05/18 10:58 AM. Always use your most recent med list.          albuterol 108 (90 Base) MCG/ACT inhaler Commonly known as:  PROVENTIL HFA;VENTOLIN HFA Inhale 2 puffs into the lungs every 6 (six) hours as needed for wheezing or shortness of breath.   amitriptyline 10 MG tablet Commonly known as:  ELAVIL Take 10 mg by mouth at bedtime as needed for sleep.   ibuprofen 100 MG tablet Commonly known as:  ADVIL,MOTRIN Take 100 mg by mouth every 6 (six) hours as needed for fever.   liothyronine 5 MCG tablet Commonly known as:  CYTOMEL TAKE 2 TABLETS BEFORE BREAKFAST AND ONE TABLET BEFORE LUNCH   oxybutynin 5  MG tablet Commonly known as:  DITROPAN TAKE ONE TABLET BY MOUTH EVERY 8 HOURS AS NEEDED FOR BLADDER SPASM   SYNTHROID 150 MCG tablet Generic drug:  levothyroxine 1 tablet before breakfast daily and extra half on Sundays          Review of Systems       She is on amitriptyline for overactive bladder and interstitial cystitis, she takes this only as needed because of drowsiness with this  Has history of depression in the past and does not like to take medications, she thinks Lexapro caused bladder symptoms and higher doses        Examination:    BP 110/74 (BP Location: Left Arm, Patient Position: Sitting, Cuff Size: Normal)   Pulse 68   Ht 5\' 4"  (1.626 m)   Wt 230 lb 9.6 oz (104.6 kg)   SpO2 98%   BMI 39.58 kg/m   Thyroid not palpable Normal skin appearance Biceps reflexes show normal relaxation No puffiness of face or hands  Assessment:  HYPOTHYROIDISM, Postsurgical  She has been on levothyroxine 150 mcg and liothyronine 10-15 mcg daily  She has subjectively done relatively better with combination of levothyroxine and liothyronine  She tends to have periodic fatigue regardless of her thyroid levels and has issues with insomnia and mild depression also She does better in the afternoons with taking the third tablet of Cytomel midday or  afternoon but she does not do this consistently  Currently her TSH is 1.6   PLAN:   She will try to be more regular with taking her Cytomel 5 mcg before lunch or in the afternoon No change in levothyroxine  She can discussed changing amitriptyline to nortriptyline for her bladder symptoms with her PCP  Follow-up in 6 months again  Elayne Snare 08/05/2018, 10:58 AM     Note: This office note was prepared with Dragon voice recognition system technology. Any transcriptional errors that result from this process are unintentional.

## 2018-08-16 ENCOUNTER — Other Ambulatory Visit: Payer: Self-pay | Admitting: Endocrinology

## 2018-10-11 ENCOUNTER — Encounter: Payer: Self-pay | Admitting: Physician Assistant

## 2018-10-11 ENCOUNTER — Ambulatory Visit (INDEPENDENT_AMBULATORY_CARE_PROVIDER_SITE_OTHER): Payer: Self-pay | Admitting: Physician Assistant

## 2018-10-11 VITALS — BP 110/80 | HR 81 | Temp 97.7°F | Resp 16 | Ht 64.0 in | Wt 235.0 lb

## 2018-10-11 DIAGNOSIS — M25562 Pain in left knee: Secondary | ICD-10-CM | POA: Diagnosis not present

## 2018-10-11 DIAGNOSIS — A6000 Herpesviral infection of urogenital system, unspecified: Secondary | ICD-10-CM

## 2018-10-11 DIAGNOSIS — M2550 Pain in unspecified joint: Secondary | ICD-10-CM | POA: Diagnosis not present

## 2018-10-11 DIAGNOSIS — J45909 Unspecified asthma, uncomplicated: Secondary | ICD-10-CM

## 2018-10-11 DIAGNOSIS — N761 Subacute and chronic vaginitis: Secondary | ICD-10-CM

## 2018-10-11 LAB — POCT URINALYSIS DIPSTICK
Bilirubin, UA: NEGATIVE
Blood, UA: NEGATIVE
Glucose, UA: NEGATIVE
Ketones, UA: NEGATIVE
Leukocytes, UA: NEGATIVE
Nitrite, UA: NEGATIVE
Protein, UA: NEGATIVE
Spec Grav, UA: 1.02 (ref 1.010–1.025)
Urobilinogen, UA: 0.2 E.U./dL
pH, UA: 5 (ref 5.0–8.0)

## 2018-10-11 LAB — POCT URINE PREGNANCY: Preg Test, Ur: NEGATIVE

## 2018-10-11 MED ORDER — VALACYCLOVIR HCL 500 MG PO TABS: 500.0000 mg | ORAL_TABLET | Freq: Two times a day (BID) | ORAL | 0 refills | Status: AC

## 2018-10-11 MED ORDER — MONTELUKAST SODIUM 10 MG PO TABS: 10.0000 mg | ORAL_TABLET | Freq: Every day | ORAL | 0 refills | Status: DC

## 2018-10-11 MED ORDER — TINIDAZOLE 500 MG PO TABS: 2.0000 g | ORAL_TABLET | Freq: Every day | ORAL | 0 refills | Status: AC

## 2018-10-11 MED ORDER — ALBUTEROL SULFATE HFA 108 (90 BASE) MCG/ACT IN AERS: 2.0000 | INHALATION_SPRAY | RESPIRATORY_TRACT | 0 refills | Status: DC | PRN

## 2018-10-11 NOTE — Progress Notes (Addendum)
Patient ID: AMRUTHA AVERA DOB: 04-28-1976 AGE: 43 y.o. MRN: [id number]   PCP: Rusty Aus, MD   Chief Complaint:  Chief Complaint  Patient presents with  . Pelvic Pain    3-4 wks     Subjective:    HPI:  Hailey [name] is a 43 y.o. female presents for evaluation  Chief Complaint  Patient presents with  . Pelvic Pain    67-63 wks   43 year old female presents to [facility] with 3-4 weeks of vaginal discomfort. States hesitant to call it pain. Describes as uncomfortable sensation. Intermittent. Associated foul odor to vaginal area. Very scant discharge. Has not taken any OTC medication for symptom relief. States symptoms feel similar to previous presentations of BV; responds best to oral Tinidazole. Has failed MetroGel in the past. Oral Flagyl causes too much stomach discomfort. Patient denies fever, chills, headache, flank pain, abdominal pain, nausea/vomiting, diarrhea, vaginal rash/pruritis. Patient in monogamous relationship with husband. Not on any birth control. Denies concern for STD. Patient with herpes labialis; typically 4-6 exacerbations per year. Has never been on suppressive therapy. Patient with two previous pregnancies; one healthy child at age 54 years old, vaginal delivery and one elective abortion (no complications).   Patient's menstruation regularly. LMP January 27th, currently spotting, on end of period.  Patient with diagnosis of chronic pelvic pain and interstitial cystitis. On Oxybutynin prn. Denies recent increased urinary frequency, urinary urgency, dysuria, gross hematuria. Patient last seen by gynecologist, Glenis Smoker CNM with [facility] for annual exam 04/22/2018. Pap performed, benign.  Patient seen at [facility] urgent care on 04/07/2018 by Dr. Norval Gable for few days of vaginal irritation. Used leftover Diflucan and MetroGel vaginally with no symptom relief. Continued vaginal pruritis, minimal discharge, lower abdominal discomfort, and  intermittent nausea. No pelvic exam performed. UA nonspecific. Wet prep negative. Patient prescribed Macrobin, Diflucan, and Tinidazole.   Patient followed up with Mortimer Fries PA with [facility] for RUQ abdominal pain and acute vaginitis. CBC, CMP, abdominal x-ray series, and CT scan of abdomen/pelvis performed (concern for appendicitis). CT negative. Patient prescribed Augmentin due to abdominal cramping, suspicion for gastroenteritis.  Patient also makes mention while she is here that a few days ago she raked leaves. Flared-up her asthma. Patient with diagnosis of asthma. Typically associated with seasonal allergies. Not on daily maintenance inhaler. Wheezing, this past week, most prevalent at night. Patient's albuterol inhaler with only a few puffs left. Denies chest pain, SOB, constant wheezing, cough, post-tussive vomiting. Patient requesting refill of albuterol inhaler.  A limited review of symptoms was performed, pertinent positives and negatives as mentioned in HPI.  The following portions of the patient's history were reviewed and updated as appropriate: allergies, current medications and past medical history.  Patient Active Problem List   Diagnosis Date Noted  . 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 Status post laparoscopic cholecystectomy 07/13/2016  . Interstitial cystitis 07/03/2016  . Pelvic pain in female 07/03/2016  . 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 Morbid obesity with BMI of 40.0-44.9, adult ([place]) 02/14/2016  . Polyarthralgia 02/14/2016  . GERD (gastroesophageal reflux disease) 02/10/2014    Allergies  Allergen Reactions  . Lodine [Etodolac] Shortness Of Breath  . Codeine Other (See Comments)  . Escitalopram Other (See Comments)    Intolerant.  Mack Hook [Levofloxacin In D5w]   . Quetiapine Other (See Comments)    Excessive sedation at 50 mg.  . Sulfa Antibiotics   . Zanaflex [Tizanidine Hcl]   .  Latex Rash    Current  Outpatient Medications on File Prior to Visit  Medication Sig Dispense Refill  . albuterol (PROVENTIL HFA;VENTOLIN HFA) 108 (90 Base) MCG/ACT inhaler Inhale 2 puffs into the lungs every 6 (six) hours as needed for wheezing or shortness of breath. 1 Inhaler 0  . amitriptyline (ELAVIL) 10 MG tablet Take 10 mg by mouth at bedtime as needed for sleep.    Marland Kitchen ibuprofen (ADVIL,MOTRIN) 100 MG tablet Take 100 mg by mouth every 6 (six) hours as needed for fever.    Marland Kitchen liothyronine (CYTOMEL) 5 MCG tablet TAKE 2 TABLETS BEFORE BREAKFAST AND 1 TABLET BEFORE LUNCH. 90 tablet 2  . SYNTHROID 150 MCG tablet 1 tablet before breakfast daily and extra half on Sundays 32 tablet 3   No current facility-administered medications on file prior to visit.        Objective:   Vitals:   10/11/18 1033  BP: 110/80  Pulse: 81  Resp: 16  Temp: 97.7 F (36.5 C)  SpO2: 98%     Wt Readings from Last 3 Encounters:  10/11/18 235 lb (106.6 kg)  08/05/18 230 lb 9.6 oz (104.6 kg)  04/07/18 220 lb (99.8 kg)    Physical Exam:   General Appearance:  Patient sitting comfortably on examination table. Conversational. Kermit Balo self-historian. In no acute distress. Afebrile.   Head:  Normocephalic, without obvious abnormality, atraumatic  Neck: Supple, symmetrical, trachea midline, no adenopathy  Lungs:   Clear to auscultation bilaterally, respirations unlabored. Good aeration. No wheezing, rales, rhonchi, or crackles. 98% pulse ox.  Heart:  Regular rate and rhythm, S1 and S2 normal, no murmur, rub, or gallop  Abdomen:   Abdomen normal to inspection. No gross deformity. No rash. No ecchymosis. Normoactive bowel sounds. No tenderness with palpation. Soft. No guarding, rigidity, or rebound tenderness. No palpable masses. No palpable organomegaly. No CVA tenderness with percussion bilaterally.  Extremities: Extremities normal, atraumatic, no cyanosis or edema  Pulses: 2+ and symmetric  Skin: Skin color, texture, turgor normal, no  rashes or lesions  Lymph nodes: Cervical, supraclavicular, and axillary nodes normal  Neurologic: Normal    Assessment & Plan:    Exam findings, diagnosis etiology and medication use and indications reviewed with patient. Follow-Up and discharge instructions provided. No emergent/urgent issues found on exam.  Patient education was provided.   Patient verbalized understanding of information provided and agrees with plan of care (POC), all questions answered. The patient is advised to call or return to clinic if condition does not see an improvement in symptoms, or to seek the care of the closest emergency department if condition worsens with the below plan.    1. Chronic vaginitis - POCT Urinalysis Dipstick - POCT urine pregnancy - tinidazole (TINDAMAX) 500 MG tablet; Take 4 tablets (2,000 mg total) by mouth daily with breakfast for 2 days.  Dispense: 8 tablet; Refill: 0  2. Genital herpes simplex, unspecified site - valACYclovir (VALTREX) 500 MG tablet; Take 1 tablet (500 mg total) by mouth 2 (two) times daily for 3 days.  Dispense: 24 tablet; Refill: 0  3. Asthma in adult without complication, unspecified asthma severity, unspecified whether persistent - albuterol (PROVENTIL HFA;VENTOLIN HFA) 108 (90 Base) MCG/ACT inhaler; Inhale 2 puffs into the lungs every 4 (four) hours as needed.  Dispense: 1 Inhaler; Refill: 0 - montelukast (SINGULAIR) 10 MG tablet; Take 1 tablet (10 mg total) by mouth at bedtime.  Dispense: 30 tablet; Refill: 0  Patient with 3-4 weeks of vaginal discomfort and  foul odor. States her typical presentation for BV. Last treated for BV in July 2019. Patient regularly managed by Ob/Gyn and PCP. Offered pelvic examination, patient declined. UA benign, including negative urine pregnancy test. Patient responds best to Tinidazole; prescribed 1G qd x 2 days. Advised patient follow-up with Ob/Gyn or PCP if symptoms do not resolve. Further evaluation, including pelvic exam with  vaginal swab testing warranted.  Patient also with request for Valtrex for future genital eruptions. Known diagnosis of genital herpes. No current eruption. Agreed to prescribe patient Valtrex, to be taken at first sign of symptoms (tingling/burning sensation). Discussed if eruptions become more frequent, should discuss suppressive therapy with Ob/Gyn or PCP.  Patient also with request for refill of albuterol inhaler. Patient with diagnosis of asthma. Recent mild flare-up. Lungs clear to auscultation on examination. 98% pulse ox. Refilled patient's albuterol inhaler. Also prescribed Singulair daily x 30 days. Informed patient if she liked medication, felt it was beneficial, could ask for PCP for refill. If any chest tightness/wheezing worsening, should follow-up with PCP or urgent care.  Patient agreed with plan as above. Patient charged for two acute visits due to vaginal symptoms and medication refill (for asthma, unrelated to original complaint).   Darlin Priestly, MHS, PA-C Montey Hora, MHS, PA-C Advanced Practice Provider [facility]  [address], [facility], [place], [place] [address] (p):  [phone] [email or web address]

## 2018-10-11 NOTE — Patient Instructions (Addendum)
Thank you for choosing [facility] for your health care needs.  Take prescription medications as prescribed. Tinidazole.  Recommend follow-up with family physician and/or gynecologist for further evaluation of current symptoms and long term management of chronic pelvic pain, interstitial cystitis, and recurrent vaginal infections (both yeast and bacterial vaginosis).  Provided refill of albuterol inhaler. Take Singulair daily at night; may help with night time wheezing.  Provided as needed Valtrex for genital herpes flare-ups. Take 1 pill by mouth, twice a day x 3 days, at first onset of symptoms.  Hope you feel better soon!  Vaginitis  Vaginitis is irritation and swelling (inflammation) of the vagina. It happens when normal bacteria and yeast in the vagina grow too much. There are many types of this condition. Treatment will depend on the type you have. Follow these instructions at home: Lifestyle  Keep your vagina area clean and dry. ? Avoid using soap. ? Rinse the area with water.  Do not do the following until your doctor says it is okay: ? Wash and clean out the vagina (douche). ? Use tampons. ? Have sex.  Wipe from front to back after going to the bathroom.  Let air reach your vagina. ? Wear cotton underwear. ? Do not wear: ? Underwear while you sleep. ? Tight pants. ? Thong underwear. ? Underwear or nylons without a cotton panel. ? Take off any wet clothing, such as bathing suits, as soon as possible.  Use gentle, non-scented products. Do not use things that can irritate the vagina, such as fabric softeners. Avoid the following products if they are scented: ? Feminine sprays. ? Detergents. ? Tampons. ? Feminine hygiene products. ? Soaps or bubble baths.  Practice safe sex and use condoms. General instructions  Take over-the-counter and prescription medicines only as told by your doctor.  If you were prescribed an antibiotic medicine, take or use it as told by  your doctor. Do not stop taking or using the antibiotic even if you start to feel better.  Keep all follow-up visits as told by your doctor. This is important. Contact a doctor if:  You have pain in your belly.  You have a fever.  Your symptoms last for more than 2-3 days. Get help right away if:  You have a fever and your symptoms get worse all of a sudden. Summary  Vaginitis is irritation and swelling of the vagina. It can happen when the normal bacteria and yeast in the vagina grow too much. There are many types.  Treatment will depend on the type you have.  Do not douche, use tampons , or have sex until your health care provider approves. When you can return to sex, practice safe sex and use condoms. This information is not intended to replace advice given to you by your health care provider. Make sure you discuss any questions you have with your health care provider. Document Released: 11/24/2008 Document Revised: 09/19/2016 Document Reviewed: 09/19/2016 Elsevier Interactive Patient Education  2019 [employer].

## 2018-10-14 ENCOUNTER — Telehealth: Payer: Self-pay | Admitting: Emergency Medicine

## 2018-10-14 NOTE — Telephone Encounter (Signed)
Spoke with patient whom stated she is doing so much better. This was a follow up call from [facility] visit

## 2018-10-25 ENCOUNTER — Ambulatory Visit: Payer: 59 | Admitting: Podiatry

## 2018-10-25 ENCOUNTER — Encounter: Payer: Self-pay | Admitting: Podiatry

## 2018-10-25 DIAGNOSIS — B351 Tinea unguium: Secondary | ICD-10-CM | POA: Diagnosis not present

## 2018-10-25 DIAGNOSIS — Z79899 Other long term (current) drug therapy: Secondary | ICD-10-CM | POA: Diagnosis not present

## 2018-10-25 MED ORDER — ITRACONAZOLE 100 MG PO CAPS: 100.0000 mg | ORAL_CAPSULE | Freq: Two times a day (BID) | ORAL | 1 refills | Status: DC

## 2018-10-26 LAB — HEPATIC FUNCTION PANEL
ALT: 14 IU/L (ref 0–32)
AST: 17 IU/L (ref 0–40)
Albumin: 4.8 g/dL (ref 3.8–4.8)
Alkaline Phosphatase: 135 IU/L — ABNORMAL HIGH (ref 39–117)
Bilirubin Total: 0.3 mg/dL (ref 0.0–1.2)
Bilirubin, Direct: 0.12 mg/dL (ref 0.00–0.40)
Total Protein: 7.5 g/dL (ref 6.0–8.5)

## 2018-10-28 ENCOUNTER — Other Ambulatory Visit: Payer: 59

## 2018-10-28 NOTE — Progress Notes (Signed)
   Subjective: 43 year old female presenting today as a new patient with a chief complaint of possible nail fungus to some of her nails bilaterally that appeared a few months ago. She states she had a pedicure shortly before her symptoms began. She reports associated thickening and discoloration of the nails. She states she was treated with Lamisil but had to discontinue use due to hives. There are no modifying factors noted. Patient is here for further evaluation and treatment.   Past Medical History:  Diagnosis Date  . Allergic state   . Arthritis   . Asthma   . Cervical disc disease   . Depression   . GERD (gastroesophageal reflux disease)   . Glucose intolerance (impaired glucose tolerance)   . H/O colonoscopy   . HPV (human papilloma virus) infection   . Hypothyroidism   . Interstitial cystitis   . LGSIL (low grade squamous intraepithelial dysplasia)   . Paresthesia     Objective: Physical Exam General: The patient is alert and oriented x3 in no acute distress.  Dermatology: Hyperkeratotic, discolored, thickened, onychodystrophy of the left hallux and bilateral fifth toenails noted bilaterally. Skin is warm, dry and supple bilateral lower extremities. Negative for open lesions or macerations.  Vascular: Palpable pedal pulses bilaterally. No edema or erythema noted. Capillary refill within normal limits.  Neurological: Epicritic and protective threshold grossly intact bilaterally.   Musculoskeletal Exam: Range of motion within normal limits to all pedal and ankle joints bilateral. Muscle strength 5/5 in all groups bilateral.   Assessment: #1 Onychomycosis left hallux and bilateral 5th toenails   Plan of Care:  #1 Patient was evaluated. #2 Liver function test ordered.  #3 Prescription for Sporanox 100 mg BID x 90 days provided to patient. Patient allergic to Lamisil.  #4 Appointment with Janett Billow, RN for laser treatment.  #5 Return to clinic as needed.   Does CT scans at  [facility].     Edrick Kins, DPM [facility]  Dr. Edrick Kins, [address]                                        [place], [place] [address]                Office [phone]  Fax [phone]

## 2018-11-01 ENCOUNTER — Other Ambulatory Visit: Payer: 59

## 2018-11-04 ENCOUNTER — Other Ambulatory Visit: Payer: 59

## 2018-11-08 ENCOUNTER — Telehealth: Payer: Self-pay | Admitting: *Deleted

## 2018-11-08 NOTE — Telephone Encounter (Signed)
Pt states she was able to review her labs and noticed there were elevated values, and she had not been on the medication when the labs were drawn, does Dr. Amalia Hailey still want her to take the medication.

## 2018-11-08 NOTE — Telephone Encounter (Signed)
Left message informing pt I had sent a message to Dr. Amalia Hailey and would call again with instructions, and not to take the medication.

## 2018-11-18 ENCOUNTER — Ambulatory Visit: Payer: Self-pay

## 2018-11-18 DIAGNOSIS — M2550 Pain in unspecified joint: Secondary | ICD-10-CM | POA: Diagnosis not present

## 2018-11-18 DIAGNOSIS — B351 Tinea unguium: Secondary | ICD-10-CM

## 2018-11-28 ENCOUNTER — Other Ambulatory Visit: Payer: Self-pay | Admitting: Endocrinology

## 2018-12-02 DIAGNOSIS — M2550 Pain in unspecified joint: Secondary | ICD-10-CM | POA: Diagnosis not present

## 2018-12-02 DIAGNOSIS — M545 Low back pain: Secondary | ICD-10-CM | POA: Diagnosis not present

## 2018-12-02 DIAGNOSIS — Z6841 Body Mass Index (BMI) 40.0 and over, adult: Secondary | ICD-10-CM | POA: Diagnosis not present

## 2018-12-02 DIAGNOSIS — R5382 Chronic fatigue, unspecified: Secondary | ICD-10-CM | POA: Diagnosis not present

## 2018-12-02 DIAGNOSIS — R899 Unspecified abnormal finding in specimens from other organs, systems and tissues: Secondary | ICD-10-CM | POA: Diagnosis not present

## 2018-12-02 LAB — HEPATIC FUNCTION PANEL
ALT: 17 (ref 7–35)
AST: 15 (ref 13–35)
Alkaline Phosphatase: 116 (ref 25–125)
Bilirubin, Total: 0.5

## 2018-12-02 LAB — CBC AND DIFFERENTIAL
HCT: 42 (ref 36–46)
Hemoglobin: 13.9 (ref 12.0–16.0)
Platelets: 416 — AB (ref 150–399)
WBC: 8.5

## 2018-12-02 LAB — POCT ERYTHROCYTE SEDIMENTATION RATE, NON-AUTOMATED: Sed Rate: 28

## 2018-12-02 LAB — TSH: TSH: 11.9 — AB (ref 0.41–5.90)

## 2018-12-02 LAB — VITAMIN D 25 HYDROXY (VIT D DEFICIENCY, FRACTURES): Vit D, 25-Hydroxy: 29

## 2018-12-02 LAB — HM HEPATITIS C SCREENING LAB: HM Hepatitis Screen: NEGATIVE

## 2018-12-06 ENCOUNTER — Ambulatory Visit: Payer: Self-pay | Admitting: Internal Medicine

## 2018-12-17 ENCOUNTER — Other Ambulatory Visit: Payer: Self-pay | Admitting: Endocrinology

## 2018-12-17 MED FILL — AMITRIPTYLINE HCL 25 MG TAB: 25 | 30 days supply | Qty: 30 | Fill #0

## 2018-12-17 MED FILL — LIOTHYRONINE SODIUM 5 MCG T: 5 | 30 days supply | Qty: 90 | Fill #0

## 2018-12-22 MED FILL — SYNTHROID 150 MCG TABLET: 150 | 30 days supply | Qty: 32 | Fill #0

## 2018-12-24 DIAGNOSIS — R5382 Chronic fatigue, unspecified: Secondary | ICD-10-CM | POA: Diagnosis not present

## 2018-12-24 DIAGNOSIS — M545 Low back pain: Secondary | ICD-10-CM | POA: Diagnosis not present

## 2018-12-24 DIAGNOSIS — M2550 Pain in unspecified joint: Secondary | ICD-10-CM | POA: Diagnosis not present

## 2018-12-24 DIAGNOSIS — R899 Unspecified abnormal finding in specimens from other organs, systems and tissues: Secondary | ICD-10-CM | POA: Diagnosis not present

## 2018-12-24 DIAGNOSIS — M450 Ankylosing spondylitis of multiple sites in spine: Secondary | ICD-10-CM | POA: Diagnosis not present

## 2018-12-27 ENCOUNTER — Encounter: Payer: Self-pay | Admitting: Internal Medicine

## 2018-12-29 ENCOUNTER — Encounter: Payer: Self-pay | Admitting: Internal Medicine

## 2018-12-30 ENCOUNTER — Other Ambulatory Visit: Payer: 59

## 2018-12-30 ENCOUNTER — Other Ambulatory Visit: Payer: Self-pay

## 2018-12-30 ENCOUNTER — Encounter: Payer: Self-pay | Admitting: Internal Medicine

## 2018-12-30 ENCOUNTER — Ambulatory Visit: Payer: 59 | Admitting: Internal Medicine

## 2018-12-30 VITALS — BP 132/78 | HR 68 | Ht 64.0 in | Wt 225.0 lb

## 2018-12-30 DIAGNOSIS — J45909 Unspecified asthma, uncomplicated: Secondary | ICD-10-CM | POA: Diagnosis not present

## 2018-12-30 DIAGNOSIS — K529 Noninfective gastroenteritis and colitis, unspecified: Secondary | ICD-10-CM

## 2018-12-30 DIAGNOSIS — R945 Abnormal results of liver function studies: Secondary | ICD-10-CM

## 2018-12-30 DIAGNOSIS — E890 Postprocedural hypothyroidism: Secondary | ICD-10-CM

## 2018-12-30 DIAGNOSIS — R7989 Other specified abnormal findings of blood chemistry: Secondary | ICD-10-CM

## 2018-12-30 DIAGNOSIS — M459 Ankylosing spondylitis of unspecified sites in spine: Secondary | ICD-10-CM

## 2018-12-30 MED ORDER — MONTELUKAST SODIUM 10 MG PO TABS: 10.0000 mg | ORAL_TABLET | Freq: Every day | ORAL | 3 refills | Status: DC

## 2018-12-30 NOTE — Progress Notes (Signed)
Date:  12/30/2018   Name:  Hailey [name]   DOB:  1975/09/18   MRN:  [id number]   Chief Complaint: Establish Care; Asthma (Refill on singulair); Hypothyroidism; and Abnormal Lab (Liver panel elevated )  Asthma  She complains of cough and wheezing. The problem occurs intermittently. Pertinent negatives include no chest pain, fever, headaches, myalgias, trouble swallowing or weight loss. Her symptoms are alleviated by beta-agonist. Her symptoms are not alleviated by leukotriene antagonist. Her past medical history is significant for asthma.  Thyroid Problem  Presents for initial visit. Symptoms include diarrhea. Patient reports no anxiety, fatigue, menstrual problem, palpitations or weight loss. The symptoms have been stable. Past treatments include levothyroxine. Prior procedures include thyroidectomy.  Diarrhea   This is a chronic problem. The problem occurs 2 to 4 times per day. The problem has been waxing and waning. The patient states that diarrhea does not awaken her from sleep. Associated symptoms include arthralgias, bloating, coughing and increased flatus. Pertinent negatives include no abdominal pain, chills, fever, headaches, myalgias, vomiting or weight loss. Nothing aggravates the symptoms. She has tried nothing for the symptoms.  Elevated GGT - noted 2 months ago on labs done prior to antifungal therapy.  She was told get this rechecked.  However, she had labs done by Rheumatology last week and LFTs are now normal.   Arthralgias - she has had several years of migratory joint pains without objective findings.  She was seen recently by a third Rheumatologist in [place].  She had extensive labs showing +HLA-[id number], elevated CRP and Sed Rate.  Hep A/B/C were negative.  CBC was normal.  TSH 11.19.  Vitamin D =29. ANA negative.  CCP normal. ACE normal. RF normal.  LFTs normal. Quantiferon Gold negative. She has plans to start Humira therapy.  Lab Results  Component Value Date   ALT 17  12/02/2018   AST 15 12/02/2018   ALKPHOS 116 12/02/2018   BILITOT 0.3 10/25/2018   Lab Results  Component Value Date   ALT 17 12/02/2018   AST 15 12/02/2018   ALKPHOS 116 12/02/2018   BILITOT 0.3 10/25/2018   Lab Results  Component Value Date   WBC 8.5 12/02/2018   HGB 13.9 12/02/2018   HCT 42 12/02/2018   MCV 87.2 09/17/2016   PLT 416 (A) 12/02/2018   Lab Results  Component Value Date   TSH 11.90 (A) 12/02/2018     Review of Systems  Constitutional: Negative for chills, fatigue, fever, unexpected weight change and weight loss.  HENT: Negative for trouble swallowing.   Respiratory: Positive for cough and wheezing.   Cardiovascular: Negative for chest pain, palpitations and leg swelling.  Gastrointestinal: Positive for bloating, diarrhea and flatus. Negative for abdominal pain, blood in stool and vomiting.  Endocrine: Negative for polyuria.  Genitourinary: Positive for dysuria. Negative for hematuria and menstrual problem.  Musculoskeletal: Positive for arthralgias and neck pain. Negative for gait problem, joint swelling, myalgias and neck stiffness.  Skin: Negative for color change and rash.  Neurological: Negative for dizziness, light-headedness and headaches.  Hematological: Positive for adenopathy (on left side of neck - evaluated by several MDs - no concerns noted).  Psychiatric/Behavioral: Negative for dysphoric mood. The patient is not nervous/anxious.     Patient Active Problem List   Diagnosis Date Noted  . Ankylosing spondylitis ([place]) 12/30/2018  . 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 Interstitial cystitis 07/03/2016  . Pelvic pain in female 07/03/2016  . 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 BMI  38.0-38.9,adult 02/14/2016  . Polyarthralgia 02/14/2016  . GERD (gastroesophageal reflux disease) 02/10/2014    Allergies  Allergen Reactions  . Codeine Other (See Comments)    Hullacinations  . Hydrocodone-Acetaminophen Other (See Comments)     Cannot urinate on medication  . Lodine [Etodolac] Shortness Of Breath  . Quetiapine Other (See Comments)    Excessive sedation at 50 mg.  . Zanaflex [Tizanidine Hcl]   . Ciprofloxacin Hives  . Escitalopram Other (See Comments)    Intolerant.  . Latex Rash  . Levaquin [Levofloxacin In D5w] Rash    HIVES    Past Surgical History:  Procedure Laterality Date  . BREAST BIOPSY Right 10/18/2015   bx/clip- neg  . CHOLECYSTECTOMY N/A 07/07/2016   Procedure: LAPAROSCOPIC CHOLECYSTECTOMY;  Surgeon: Olean Ree, MD;  Location: [facility] ORS;  Service: General;  Laterality: N/A;  . COLONOSCOPY WITH PROPOFOL N/A 10/11/2015   Procedure: COLONOSCOPY WITH PROPOFOL;  Surgeon: Lollie Sails, MD;  Location: [facility] ENDOSCOPY;  Service: Endoscopy;  Laterality: N/A;  . ESOPHAGOGASTRODUODENOSCOPY (EGD) WITH PROPOFOL N/A 10/11/2015   Procedure: ESOPHAGOGASTRODUODENOSCOPY (EGD) WITH PROPOFOL;  Surgeon: Lollie Sails, MD;  Location: [facility] ENDOSCOPY;  Service: Endoscopy;  Laterality: N/A;  . TONSILLECTOMY    . TOTAL THYROIDECTOMY  2005    Social History   Tobacco Use  . Smoking status: Former Smoker    Packs/day: 0.50    Years: 7.00    Pack years: 3.50    Types: Cigarettes    Last attempt to quit: 2003    Years since quitting: 17.3  . Smokeless tobacco: Never Used  Substance Use Topics  . Alcohol use: Yes    Comment: Occ.  . Drug use: No     Medication list has been reviewed and updated.  Current Meds  Medication Sig  . albuterol (PROVENTIL HFA;VENTOLIN HFA) 108 (90 Base) MCG/ACT inhaler Inhale 2 puffs into the lungs every 6 (six) hours as needed for wheezing or shortness of breath.  Marland Kitchen amitriptyline (ELAVIL) 10 MG tablet Take 10 mg by mouth at bedtime as needed (Bladder spasms).   Marland Kitchen ibuprofen (ADVIL) 200 MG tablet Take 200 mg by mouth every 6 (six) hours as needed.  Marland Kitchen liothyronine (CYTOMEL) 5 MCG tablet TAKE 2 TABLETS BEFORE BREAKFAST AND 1 TABLET BEFORE LUNCH.  . montelukast (SINGULAIR) 10  MG tablet Take 1 tablet (10 mg total) by mouth at bedtime.  Marland Kitchen SYNTHROID 150 MCG tablet TAKE 1 TABLET BY MOUTH ONCE DAILY BEFORE BREAKFAST AND EXTRA HALF ON SUNDAYS  . [DISCONTINUED] montelukast (SINGULAIR) 10 MG tablet Take 1 tablet (10 mg total) by mouth at bedtime.    PHQ 2/9 Scores 12/30/2018  PHQ - 2 Score 0    BP Readings from Last 3 Encounters:  12/30/18 132/78  10/11/18 110/80  07/07/16 (!) 114/55    Physical Exam Vitals signs and nursing note reviewed.  Constitutional:      General: She is not in acute distress.    Appearance: She is well-developed.  HENT:     Head: Normocephalic and atraumatic.  Eyes:     Pupils: Pupils are equal, round, and reactive to light.  Neck:     Musculoskeletal: Normal range of motion and neck supple. Muscular tenderness (sebaceous cyst left lateral neck) present.  Cardiovascular:     Rate and Rhythm: Normal rate and regular rhythm.     Heart sounds: No murmur.  Pulmonary:     Effort: Pulmonary effort is normal. No respiratory distress.     Breath  sounds: No wheezing, rhonchi or rales.  Abdominal:     General: Bowel sounds are normal. There is no distension.     Palpations: There is no mass.     Tenderness: There is no abdominal tenderness. There is no guarding.  Musculoskeletal: Normal range of motion.     Right lower leg: No edema.     Left lower leg: No edema.  Lymphadenopathy:     Cervical: No cervical adenopathy.  Skin:    General: Skin is warm and dry.     Findings: No rash.     Comments: Mild thickening of left great toe nail  Neurological:     Mental Status: She is alert and oriented to person, place, and time.  Psychiatric:        Attention and Perception: Attention normal.        Mood and Affect: Mood normal.        Behavior: Behavior normal.        Thought Content: Thought content normal.        Cognition and Memory: Cognition normal.        Judgment: Judgment normal.     Wt Readings from Last 3 Encounters:   12/30/18 225 lb (102.1 kg)  10/11/18 235 lb (106.6 kg)  07/04/16 231 lb (104.8 kg)    BP 132/78   Pulse 68   Ht '5\' 4"'  (1.626 m)   Wt 225 lb (102.1 kg)   LMP 12/24/2018 (Exact Date)   SpO2 98%   BMI 38.62 kg/m   Assessment and Plan: 1. Chronic diarrhea Will rule out Celiac; consider referral back to GI - Gliadin antibodies, serum  2. Elevated liver function tests Now resolved; she can take the antifungal medicatioin  3. Asthma in adult without complication, unspecified asthma severity, unspecified whether persistent controlled - montelukast (SINGULAIR) 10 MG tablet; Take 1 tablet (10 mg total) by mouth at bedtime.  Dispense: 90 tablet; Refill: 3  4. Ankylosing spondylitis, unspecified site of spine ([place]) Planning to start Humira  5. Post-surgical hypothyroidism Supplemented but will recently elevated TSH - follow up with Endocrinology as planned   Partially dictated using [place]. Any errors are unintentional.  Halina Maidens, MD [facility]  12/30/2018

## 2018-12-30 NOTE — Patient Instructions (Signed)
This information is directly available on the CDC website: https://www.cdc.gov/coronavirus/2019-ncov/if-you-are-sick/steps-when-sick.html    Source:CDC Reference to specific commercial products, manufacturers, companies, or trademarks does not constitute its endorsement or recommendation by the U.S. Government, Department of Health and Human Services, or Centers for Disease Control and Prevention.  

## 2018-12-31 LAB — GLIADIN ANTIBODIES, SERUM
Antigliadin Abs, IgA: 3 units (ref 0–19)
Gliadin IgG: 2 units (ref 0–19)

## 2019-01-03 ENCOUNTER — Ambulatory Visit: Payer: 59 | Admitting: Internal Medicine

## 2019-01-03 ENCOUNTER — Other Ambulatory Visit: Payer: Self-pay

## 2019-01-03 ENCOUNTER — Ambulatory Visit: Payer: Self-pay

## 2019-01-03 DIAGNOSIS — Z79899 Other long term (current) drug therapy: Secondary | ICD-10-CM

## 2019-01-03 DIAGNOSIS — B351 Tinea unguium: Secondary | ICD-10-CM

## 2019-01-03 NOTE — Progress Notes (Signed)
Pt presents with mycotic infection of nails 1-5 bilateral.  All other systems are negative  Laser therapy administered to affected nails and tolerated well. All safety precautions were in place.  3rd treatment.  Follow up in 4 weeks     

## 2019-01-09 ENCOUNTER — Encounter: Payer: Self-pay | Admitting: Pharmacist

## 2019-01-09 ENCOUNTER — Other Ambulatory Visit: Payer: Self-pay

## 2019-01-09 ENCOUNTER — Ambulatory Visit (INDEPENDENT_AMBULATORY_CARE_PROVIDER_SITE_OTHER): Payer: 59 | Admitting: Pharmacist

## 2019-01-09 DIAGNOSIS — Z79899 Other long term (current) drug therapy: Secondary | ICD-10-CM

## 2019-01-09 MED ORDER — ADALIMUMAB 40 MG/0.4ML ~~LOC~~ AJKT: 40.0000 mg | AUTO-INJECTOR | SUBCUTANEOUS | 2 refills | Status: DC

## 2019-01-09 NOTE — Progress Notes (Signed)
  S: Patient presents for review of their specialty medication therapy.  Patient is currently taking Humira for ankylosing spondylitis. Patient is managed by [facility] for this.   Adherence: has only had one dose  Efficacy: no effect yet, has only had one dose.  Dosing:  Ankylosing spondylitis: SubQ: 40 mg every other week (may continue methotrexate, other nonbiologic DMARDS, corticosteroids, NSAIDs and/or analgesics)  Dose adjustments: Renal: no dose adjustments (has not been studied) Hepatic: no dose adjustments (has not been studied)  Screening: TB test: negative screening per patient Hepatitis: hepatic function panel 12/02/2018 WNL  Monitoring: S/sx of infection: denies CBC: see below S/sx of hypersensitivity: denies S/sx of malignancy: denies S/sx of heart failure: denies  Other side effects: denies  Patient reports that she worked with the [place] to inject the first dose and she discussed all of the side effects with her.   O:     Lab Results  Component Value Date   WBC 8.5 12/02/2018   HGB 13.9 12/02/2018   HCT 42 12/02/2018   MCV 87.2 09/17/2016   PLT 416 (A) 12/02/2018      Chemistry      Component Value Date/Time   NA 136 09/17/2016 0927   NA 138 08/27/2014 1257   K 4.3 09/17/2016 0927   K 3.7 08/27/2014 1257   CL 105 09/17/2016 0927   CL 104 08/27/2014 1257   CO2 24 09/17/2016 0927   CO2 23 08/27/2014 1257   BUN 9 09/17/2016 0927   BUN 8 08/27/2014 1257   CREATININE 0.80 09/17/2016 0927   CREATININE 0.69 08/27/2014 1257      Component Value Date/Time   CALCIUM 8.8 (L) 09/17/2016 0927   CALCIUM 8.8 08/27/2014 1257   ALKPHOS 116 12/02/2018   AST 15 12/02/2018   ALT 17 12/02/2018   BILITOT 0.3 10/25/2018 1331       A/P: 1. Medication review: Patient currently on Humira for ankylosing spondylitis. Reviewed the medication with the patient, including the following: Humira is a TNF blocking agent indicated for ankylosing  spondylitis, Crohn's disease, Hidradenitis suppurativa, psoriatic arthritis, plaque psoriasis, ulcerative colitis, and uveitis. Patient educated on purpose, proper use and potential adverse effects of Humira. Possible adverse effects are increased risk of infections, headache, and injection site reactions. There is the possibility of an increased risk of malignancy but it is not well understood if this increased risk is due to there medication or the disease state. There are rare cases of pancytopenia and aplastic anemia. For SubQ injection at separate sites in the thigh or lower abdomen (avoiding areas within 2 inches of navel); rotate injection sites. May leave at room temperature for ~15 to 30 minutes prior to use; do not remove cap or cover while allowing product to reach room temperature. Do not use if solution is discolored or contains particulate matter. Do not administer to skin which is red, tender, bruised, hard, or that has scars, stretch marks, or psoriasis plaques. Needle cap of the prefilled syringe or needle cover for the adalimumab pen may contain latex. Prefilled pens and syringes are available for use by patients and the full amount of the syringe should be injected (self-administration); the vial is intended for institutional use only. Vials do not contain a preservative; discard unused portion. No recommendations for any changes at this time.  Christella Hartigan, PharmD, BCPS, BCACP, CPP Clinical Pharmacist Practitioner  [phone]

## 2019-01-10 ENCOUNTER — Ambulatory Visit: Payer: Self-pay | Admitting: Internal Medicine

## 2019-01-27 NOTE — Progress Notes (Signed)
Pt presents with mycotic infection of nails 1-5 bilateral.  All other systems are negative  Laser therapy administered to affected nails and tolerated well. All safety precautions were in place.  2nd treatment.  Follow up in 4 weeks     

## 2019-01-31 ENCOUNTER — Ambulatory Visit (INDEPENDENT_AMBULATORY_CARE_PROVIDER_SITE_OTHER): Payer: Self-pay

## 2019-01-31 ENCOUNTER — Other Ambulatory Visit: Payer: Self-pay

## 2019-01-31 DIAGNOSIS — B351 Tinea unguium: Secondary | ICD-10-CM

## 2019-02-04 ENCOUNTER — Encounter: Payer: Self-pay | Admitting: Internal Medicine

## 2019-02-04 NOTE — Progress Notes (Signed)
Pt presents with mycotic infection of nails 1-5 bilateral  All other systems are negative  Laser therapy administered to affected nails and tolerated well. All safety precautions were in place. 4th treatment.  Follow up in 4 weeks     

## 2019-02-04 NOTE — Telephone Encounter (Signed)
Please advise 

## 2019-02-05 ENCOUNTER — Other Ambulatory Visit
Admission: RE | Admit: 2019-02-05 | Discharge: 2019-02-05 | Disposition: A | Discharge status: Home / Self Care | Payer: 59 | Attending: Endocrinology | Admitting: Endocrinology

## 2019-02-05 DIAGNOSIS — E890 Postprocedural hypothyroidism: Secondary | ICD-10-CM | POA: Diagnosis not present

## 2019-02-05 LAB — T4, FREE: Free T4: 0.86 ng/dL (ref 0.82–1.77)

## 2019-02-05 LAB — TSH: TSH: 5.523 u[IU]/mL — ABNORMAL HIGH (ref 0.350–4.500)

## 2019-02-05 NOTE — Addendum Note (Signed)
Addended by: Elmo Putt on: 02/05/2019 04:46 PM   Modules accepted: Orders

## 2019-02-06 ENCOUNTER — Other Ambulatory Visit: Payer: Self-pay

## 2019-02-07 ENCOUNTER — Encounter: Payer: Self-pay | Admitting: Endocrinology

## 2019-02-07 ENCOUNTER — Ambulatory Visit: Payer: 59 | Admitting: Endocrinology

## 2019-02-07 ENCOUNTER — Other Ambulatory Visit: Payer: Self-pay

## 2019-02-07 VITALS — BP 122/80 | HR 81 | Ht 64.0 in | Wt 240.4 lb

## 2019-02-07 DIAGNOSIS — E890 Postprocedural hypothyroidism: Secondary | ICD-10-CM

## 2019-02-07 LAB — T3, FREE: T3, Free: 3.3 pg/mL (ref 2.0–4.4)

## 2019-02-07 NOTE — Progress Notes (Addendum)
Patient ID: Hailey [name], female   DOB: 1976-08-06, 43 y.o.   MRN: [id number]             Referring Physician: Sabra Heck  Reason for Appointment:  Hypothyroidism, follow-up visit    History of Present Illness:    Hypothyroidism was first diagnosed in 2006 after thyroidectomy. After her thyroid surgery she was put on levothyroxine, unknown doses Subsequently she went to an endocrinologist who felt that she will do better with a combination of Cytomel and levothyroxine since she was having weight gain The patient does not think she felt any better or had any significant weight loss with this regimen but this was continued until 04/2016 She had gone to another physician for a second opinion and she was switched to Synthroid from her previous regimen of 88 g levothyroxine and 25 g of Cytomel because of her free T3 level being high at 5.8  On her initial consultation she was complaining about weight gain, joint pains and fatigue Since her TSH was 14.7 her dose was increased from the previous 150 g up to 200 g  Recent history: Since she was having significant fatigue with 200 g Synthroid alone she has been on a regimen of Cytomel daily along with Synthroid 150 g daily since 2018  She has done better subjectively with this compared to Synthroid alone She was asked to take an extra 5 g of Cytomel at lunchtime in addition when she was seen in July 2018 since she had upper normal TSH and her symptoms were persistent  No changes were made on her last visit in 11/19 However she was reminded to take her third Cytomel before lunch consistently She is again forgetting to do this  Currently she is having only mild fatigue but is dealing with other musculoskeletal problems and pain and is concerned about her weight gain from lack of activity No cold intolerance or hair loss  She does take her morning levothyroxine and liothyronine daily consistently  Her TSH was checked by her rheumatologist in  March but no action taken even with the level being 11.9 Now with no changes in her treatment her TSH is 5.5  Free T4 is similar to the level in 2019 and T3 is slightly lower   Patient's weight history is as follows:  Wt Readings from Last 3 Encounters:  02/07/19 240 lb 6.4 oz (109 kg)  12/30/18 225 lb (102.1 kg)  10/11/18 235 lb (106.6 kg)    Thyroid function results have been as follows:  Lab Results  Component Value Date   FREET4 0.86 02/05/2019   FREET4 0.81 (L) 08/01/2018   FREET4 0.92 02/05/2018   TSH 5.523 (H) 02/05/2019   TSH 11.90 (A) 12/02/2018   TSH 1.593 08/01/2018   Lab Results  Component Value Date   T3FREE 3.3 02/05/2019   T3FREE 3.8 08/01/2018   T3FREE 2.9 02/05/2018   T3FREE 3.5 07/05/2017    The following is a copy of her surgical assessment prior to her thyroid surgery:  Patient has a history of hyperthyroidism who had a dominant right thyroid nodule identified by her endocrinologist. This nodule is photopenic and ultrasound guided biopsy has demonstrated a follicular lesion with scant colloid. 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 Her endocrinologist has recommended subtotal thyroidectomy to take care of both the nodule in the right thyroid lobe as well as the hyperfunctioning left thyroid tissue  Apparently she  had Hashimoto's thyroiditis in the left lobe on pathology  Past Medical History:  Diagnosis Date  . Allergic state   . Allergy   . Arthritis   . Asthma   . Cervical disc disease   . Depression   . GERD (gastroesophageal reflux disease)   . H/O colonoscopy   . HPV (human papilloma virus) infection   . Hypothyroidism   . Interstitial cystitis   . LGSIL (low grade squamous intraepithelial dysplasia)   . Paresthesia   . Status post laparoscopic cholecystectomy 07/13/2016    Past Surgical History:  Procedure Laterality Date  .  BREAST BIOPSY Right 10/18/2015   bx/clip- neg  . CHOLECYSTECTOMY N/A 07/07/2016   Procedure: LAPAROSCOPIC CHOLECYSTECTOMY;  Surgeon: Olean Ree, MD;  Location: [facility] ORS;  Service: General;  Laterality: N/A;  . COLONOSCOPY WITH PROPOFOL N/A 10/11/2015   Procedure: COLONOSCOPY WITH PROPOFOL;  Surgeon: Lollie Sails, MD;  Location: [facility] ENDOSCOPY;  Service: Endoscopy;  Laterality: N/A;  . ESOPHAGOGASTRODUODENOSCOPY (EGD) WITH PROPOFOL N/A 10/11/2015   Procedure: ESOPHAGOGASTRODUODENOSCOPY (EGD) WITH PROPOFOL;  Surgeon: Lollie Sails, MD;  Location: [facility] ENDOSCOPY;  Service: Endoscopy;  Laterality: N/A;  . TONSILLECTOMY    . TOTAL THYROIDECTOMY  2005   goiter    Family History  Problem Relation Age of Onset  . Hypertension Mother   . Stroke Mother   . Cancer Mother   . Goiter Mother   . Diabetes Father   . Hypertension Father   . Hypertension Brother   . Hypertension Maternal Grandmother   . Stroke Maternal Grandmother   . Diabetes Maternal Grandmother   . Breast cancer Cousin 67       pat cousin    Social History:  reports that she quit smoking about 17 years ago. Her smoking use included cigarettes. She has a 3.50 pack-year smoking history. She has never used smokeless tobacco. She reports current alcohol use. She reports that she does not use drugs.  Allergies:  Allergies  Allergen Reactions  . Codeine Other (See Comments)    Hullacinations  . Hydrocodone-Acetaminophen Other (See Comments)    Cannot urinate on medication  . Lodine [Etodolac] Shortness Of Breath  . Quetiapine Other (See Comments)    Excessive sedation at 50 mg.  . Zanaflex [Tizanidine Hcl]   . Ciprofloxacin Hives  . Escitalopram Other (See Comments)    Intolerant.  . Latex Rash  . Levaquin [Levofloxacin In D5w] Rash    HIVES    Allergies as of 02/07/2019      Reactions   Codeine Other (See Comments)   Hullacinations   Hydrocodone-acetaminophen Other (See Comments)   Cannot urinate on  medication   Lodine [etodolac] Shortness Of Breath   Quetiapine Other (See Comments)   Excessive sedation at 50 mg.   Zanaflex [tizanidine Hcl]    Ciprofloxacin Hives   Escitalopram Other (See Comments)   Intolerant.   Latex Rash   Levaquin [levofloxacin In D5w] Rash   HIVES      Medication List       Accurate as of Feb 07, 2019 10:47 AM. If you have any questions, ask your nurse or doctor.        Adalimumab 40 MG/0.4ML Pnkt Commonly known as:  Humira Pen Inject 40 mg into the skin every 14 (fourteen) days. (every other week)   albuterol 108 (90 Base) MCG/ACT inhaler Commonly known as:  VENTOLIN HFA Inhale 2 puffs into the lungs every 6 (six) hours as needed for wheezing or  shortness of breath.   amitriptyline 10 MG tablet Commonly known as:  ELAVIL Take 10 mg by mouth at bedtime as needed (Bladder spasms).   ibuprofen 200 MG tablet Commonly known as:  ADVIL Take 200 mg by mouth every 6 (six) hours as needed.   liothyronine 5 MCG tablet Commonly known as:  CYTOMEL TAKE 2 TABLETS BEFORE BREAKFAST AND 1 TABLET BEFORE LUNCH.   montelukast 10 MG tablet Commonly known as:  SINGULAIR Take 1 tablet (10 mg total) by mouth at bedtime.   Synthroid 150 MCG tablet Generic drug:  levothyroxine TAKE 1 TABLET BY MOUTH ONCE DAILY BEFORE BREAKFAST AND EXTRA HALF ON SUNDAYS          Review of Systems       She is on amitriptyline 10 mg for overactive bladder and interstitial cystitis,  She has history of depression in the past and does not like to take medications, she thinks Lexapro caused bladder symptoms and higher doses  She has knee pains and may have ankylosing spondylitis, followed by rheumatologist now        Examination:    BP 122/80 (BP Location: Left Arm, Patient Position: Sitting, Cuff Size: Normal)   Pulse 81   Ht 5\' 4"  (1.626 m)   Wt 240 lb 6.4 oz (109 kg)   SpO2 97%   BMI 41.26 kg/m   Exam not indicated   Assessment:  HYPOTHYROIDISM,  Postsurgical  She has been on levothyroxine 150 mcg and liothyronine 10-15 mcg daily  She has still mild fatigue but also has other intercurrent medical problems that are affecting how she is subjectively doing  This year her TSH has been higher without any change in treatment She also has gained weight from decreased activity She has been recommended 15 mcg of liothyronine but she does not remember to take the split doses  Currently her TSH is above normal at 5.5   PLAN:   She will now take her Cytomel 15 mcg consistently together before breakfast No change in levothyroxine She will report any effects such as excessive anxiety or palpitation with doing this  Will need short-term follow-up in 2 months  Johnavon Mcclafferty 02/07/2019, 10:47 AM     Note: This office note was prepared with Dragon voice recognition system technology. Any transcriptional errors that result from this process are unintentional.  02/25/2019:  ADDENDUM: Patient is intolerant to 15 mcg of liothyronine because of insomnia She will change her levothyroxine to 175, currently on equivalent of 160 Follow-up as scheduled  Elayne Snare

## 2019-02-07 NOTE — Progress Notes (Signed)
1 

## 2019-02-07 NOTE — Patient Instructions (Signed)
Take all 3 Cytomel in am

## 2019-02-12 MED FILL — SYNTHROID 150 MCG TABLET: 150 | 30 days supply | Qty: 32 | Fill #1

## 2019-02-12 MED FILL — LIOTHYRONINE SODIUM 5 MCG T: 5 | 30 days supply | Qty: 90 | Fill #1

## 2019-02-14 ENCOUNTER — Other Ambulatory Visit: Payer: Self-pay | Admitting: Otolaryngology

## 2019-02-14 DIAGNOSIS — M1711 Unilateral primary osteoarthritis, right knee: Secondary | ICD-10-CM | POA: Diagnosis not present

## 2019-02-14 DIAGNOSIS — R221 Localized swelling, mass and lump, neck: Secondary | ICD-10-CM

## 2019-02-14 DIAGNOSIS — M1712 Unilateral primary osteoarthritis, left knee: Secondary | ICD-10-CM | POA: Diagnosis not present

## 2019-02-21 ENCOUNTER — Ambulatory Visit: Payer: 59

## 2019-02-25 MED ORDER — LEVOTHYROXINE SODIUM 175 MCG PO TABS: 175.0000 ug | ORAL_TABLET | Freq: Every day | ORAL | 0 refills | Status: DC

## 2019-02-25 NOTE — Addendum Note (Signed)
Addended by: Elayne Snare on: 02/25/2019 12:01 PM   Modules accepted: Orders

## 2019-02-28 ENCOUNTER — Ambulatory Visit: Payer: 59

## 2019-02-28 DIAGNOSIS — M450 Ankylosing spondylitis of multiple sites in spine: Secondary | ICD-10-CM | POA: Diagnosis not present

## 2019-02-28 DIAGNOSIS — R5382 Chronic fatigue, unspecified: Secondary | ICD-10-CM | POA: Diagnosis not present

## 2019-02-28 DIAGNOSIS — R899 Unspecified abnormal finding in specimens from other organs, systems and tissues: Secondary | ICD-10-CM | POA: Diagnosis not present

## 2019-02-28 DIAGNOSIS — M2550 Pain in unspecified joint: Secondary | ICD-10-CM | POA: Diagnosis not present

## 2019-02-28 DIAGNOSIS — Z6841 Body Mass Index (BMI) 40.0 and over, adult: Secondary | ICD-10-CM | POA: Diagnosis not present

## 2019-02-28 DIAGNOSIS — M545 Low back pain: Secondary | ICD-10-CM | POA: Diagnosis not present

## 2019-03-06 ENCOUNTER — Other Ambulatory Visit: Payer: Self-pay

## 2019-03-06 ENCOUNTER — Other Ambulatory Visit: Payer: Self-pay | Admitting: Pharmacist

## 2019-03-06 ENCOUNTER — Telehealth (INDEPENDENT_AMBULATORY_CARE_PROVIDER_SITE_OTHER): Payer: 59 | Admitting: Pharmacist

## 2019-03-06 ENCOUNTER — Encounter: Payer: Self-pay | Admitting: Internal Medicine

## 2019-03-06 ENCOUNTER — Encounter: Payer: Self-pay | Admitting: Pharmacist

## 2019-03-06 DIAGNOSIS — Z79899 Other long term (current) drug therapy: Secondary | ICD-10-CM

## 2019-03-06 MED ORDER — COSENTYX SENSOREADY PEN 150 MG/ML ~~LOC~~ SOAJ: 1.0000 mL | SUBCUTANEOUS | 0 refills | Status: DC

## 2019-03-06 MED FILL — COSENTYX 300 MG DOSE-2 PENS: 150 | 27 days supply | Qty: 4 | Fill #0

## 2019-03-06 NOTE — Progress Notes (Signed)
   S: Patient presents today for review of their specialty medication.   Patient is currently taking Cosentyx (secukinumab) for ankylosing spondylitis. Patient is managed by Marita [facility] for this. She has recently failed Humira.   Dosing: Ankylosing spondylitis: SubQ: With a loading dose: 150 mg at weeks 0, 1, 2, 3, and 4 followed by 150 mg every 4 weeks; consider an increase to 300 mg every 4 weeks in patients who continue to have active ankylosing spondylitis. Without a loading dose: 150 mg every 4 weeks; consider an increase to 300 mg every 4 weeks in patients who continue to have active ankylosing spondylitis.  Adherence: has not started yet  Efficacy: has not started yet  Current adverse effects: S/sx of infection: denies GI upset: has not started med Headache: has not started med S/sx of hypersensitivity: has not started med    O:     Lab Results  Component Value Date   WBC 8.5 12/02/2018   HGB 13.9 12/02/2018   HCT 42 12/02/2018   MCV 87.2 09/17/2016   PLT 416 (A) 12/02/2018      Chemistry      Component Value Date/Time   NA 136 09/17/2016 0927   NA 138 08/27/2014 1257   K 4.3 09/17/2016 0927   K 3.7 08/27/2014 1257   CL 105 09/17/2016 0927   CL 104 08/27/2014 1257   CO2 24 09/17/2016 0927   CO2 23 08/27/2014 1257   BUN 9 09/17/2016 0927   BUN 8 08/27/2014 1257   CREATININE 0.80 09/17/2016 0927   CREATININE 0.69 08/27/2014 1257      Component Value Date/Time   CALCIUM 8.8 (L) 09/17/2016 0927   CALCIUM 8.8 08/27/2014 1257   ALKPHOS 116 12/02/2018   AST 15 12/02/2018   ALT 17 12/02/2018   BILITOT 0.3 10/25/2018 1331       A/P: 1. Medication review: Patient is currently on Cosentyx for ankylosing spondylitis but she has not started it yet. Recently failed Humira. Reviewed the medication with the patient, including the following: Cosentyx is a monoclonal antibody used in the treatment of ankylosing spondylitis, psoriasis, and psoriatic arthritis. The  injection is subq and the medication should be allowed to reach room temp prior to injecting. Injection sites should be rotated. Possible adverse effects include headaches, GI upset, increased risk of infection and hypersensitivity reactions. No recommendations for any changes at this time.  Christella Hartigan, PharmD, BCPS, BCACP, CPP Clinical Pharmacist Practitioner  [phone]

## 2019-03-10 ENCOUNTER — Ambulatory Visit (INDEPENDENT_AMBULATORY_CARE_PROVIDER_SITE_OTHER): Payer: 59 | Admitting: Podiatry

## 2019-03-10 ENCOUNTER — Other Ambulatory Visit: Payer: Self-pay

## 2019-03-10 DIAGNOSIS — Z79899 Other long term (current) drug therapy: Secondary | ICD-10-CM

## 2019-03-10 DIAGNOSIS — B351 Tinea unguium: Secondary | ICD-10-CM

## 2019-03-13 ENCOUNTER — Encounter: Payer: Self-pay | Admitting: Internal Medicine

## 2019-03-13 ENCOUNTER — Other Ambulatory Visit: Payer: Self-pay | Admitting: Internal Medicine

## 2019-03-13 ENCOUNTER — Ambulatory Visit: Payer: 59 | Admitting: Internal Medicine

## 2019-03-13 ENCOUNTER — Other Ambulatory Visit: Payer: Self-pay

## 2019-03-13 VITALS — BP 134/88 | HR 70 | Temp 98.3°F | Ht 64.0 in | Wt 237.0 lb

## 2019-03-13 DIAGNOSIS — J45909 Unspecified asthma, uncomplicated: Secondary | ICD-10-CM

## 2019-03-13 MED ORDER — ALBUTEROL SULFATE HFA 108 (90 BASE) MCG/ACT IN AERS: 2.0000 | INHALATION_SPRAY | Freq: Four times a day (QID) | RESPIRATORY_TRACT | 0 refills | Status: DC | PRN

## 2019-03-13 NOTE — Patient Instructions (Signed)
Take Singulair daily  Use albuterol inhaler up to 4 times a day as needed - call if you are not able to cut back on use

## 2019-03-13 NOTE — Progress Notes (Signed)
Date:  03/13/2019   Name:  Hailey [name]   DOB:  05-29-1976   MRN:  [id number]   Chief Complaint: Asthma (Asthma attack 2 weeks ago. Msgs [place] on my chart and told us then. She said she thinks its the mask causing it. Happened at night. Used inhaler and chest is still tight since then. )  Asthma She complains of chest tightness and shortness of breath. There is no cough, sputum production or wheezing. This is a recurrent problem. The problem occurs intermittently. Pertinent negatives include no chest pain, fever, headaches or trouble swallowing. Her symptoms are alleviated by leukotriene antagonist and beta-agonist. Her past medical history is significant for asthma.  She had an asthma attack 2 weeks ago after being [place].  She felt SOB and had chest tightness with minimal wheezing.  She went home but did not take singulair or use her inhaler until the middle of the night.  Since then she has used it only a few times but continues to feel chest tightness.  She has singulair but does not take it daily.  Review of Systems  Constitutional: Negative for chills, fatigue and fever.  HENT: Negative for trouble swallowing.   Respiratory: Positive for chest tightness and shortness of breath. Negative for cough, sputum production, choking and wheezing.   Cardiovascular: Negative for chest pain and palpitations.  Allergic/Immunologic: Negative for environmental allergies.  Neurological: Negative for dizziness and headaches.    Patient Active Problem List   Diagnosis Date Noted  . Ankylosing spondylitis ([place]) 12/30/2018  . Asthma in adult without complication 03/10/1600  . Chronic diarrhea 12/30/2018  . 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 Interstitial cystitis 07/03/2016  . Pelvic pain in female 07/03/2016  . 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 BMI 38.0-38.9,adult 02/14/2016  . Polyarthralgia 02/14/2016  . GERD (gastroesophageal reflux disease)  02/10/2014    Allergies  Allergen Reactions  . Codeine Other (See Comments)    Hullacinations  . Hydrocodone-Acetaminophen Other (See Comments)    Cannot urinate on medication  . Lodine [Etodolac] Shortness Of Breath  . Quetiapine Other (See Comments)    Excessive sedation at 50 mg.  . Zanaflex [Tizanidine Hcl]   . Ciprofloxacin Hives  . Escitalopram Other (See Comments)    Intolerant.  . Latex Rash  . Levaquin [Levofloxacin In D5w] Rash    HIVES    Past Surgical History:  Procedure Laterality Date  . BREAST BIOPSY Right 10/18/2015   bx/clip- neg  . CHOLECYSTECTOMY N/A 07/07/2016   Procedure: LAPAROSCOPIC CHOLECYSTECTOMY;  Surgeon: Olean Ree, MD;  Location: [facility] ORS;  Service: General;  Laterality: N/A;  . COLONOSCOPY WITH PROPOFOL N/A 10/11/2015   Procedure: COLONOSCOPY WITH PROPOFOL;  Surgeon: Lollie Sails, MD;  Location: [facility] ENDOSCOPY;  Service: Endoscopy;  Laterality: N/A;  . ESOPHAGOGASTRODUODENOSCOPY (EGD) WITH PROPOFOL N/A 10/11/2015   Procedure: ESOPHAGOGASTRODUODENOSCOPY (EGD) WITH PROPOFOL;  Surgeon: Lollie Sails, MD;  Location: [facility] ENDOSCOPY;  Service: Endoscopy;  Laterality: N/A;  . TONSILLECTOMY    . TOTAL THYROIDECTOMY  2005   goiter    Social History   Tobacco Use  . Smoking status: Former Smoker    Packs/day: 0.50    Years: 7.00    Pack years: 3.50    Types: Cigarettes    Quit date: 2003    Years since quitting: 17.5  . Smokeless tobacco: Never Used  Substance Use Topics  . Alcohol use: Yes    Comment: Occ.  Marland Kitchen  Drug use: No     Medication list has been reviewed and updated.  Current Meds  Medication Sig  . albuterol (VENTOLIN HFA) 108 (90 Base) MCG/ACT inhaler Inhale 2 puffs into the lungs every 6 (six) hours as needed for wheezing or shortness of breath.  Marland Kitchen amitriptyline (ELAVIL) 10 MG tablet Take 10 mg by mouth at bedtime as needed (Bladder spasms).   Marland Kitchen ibuprofen (ADVIL) 200 MG tablet Take 200 mg by mouth every 6 (six) hours as  needed.  Marland Kitchen levothyroxine (SYNTHROID) 175 MCG tablet Take 1 tablet (175 mcg total) by mouth daily before breakfast.  . liothyronine (CYTOMEL) 5 MCG tablet TAKE 2 TABLETS BEFORE BREAKFAST AND 1 TABLET BEFORE LUNCH.  . montelukast (SINGULAIR) 10 MG tablet Take 1 tablet (10 mg total) by mouth at bedtime.  . Secukinumab (COSENTYX SENSOREADY PEN) 150 MG/ML SOAJ Inject 1 mL into the skin once a week.  . [DISCONTINUED] albuterol (PROVENTIL HFA;VENTOLIN HFA) 108 (90 Base) MCG/ACT inhaler Inhale 2 puffs into the lungs every 6 (six) hours as needed for wheezing or shortness of breath.    PHQ 2/9 Scores 03/13/2019 12/30/2018  PHQ - 2 Score 0 0  PHQ- 9 Score 0 -    BP Readings from Last 3 Encounters:  03/13/19 134/88  12/30/18 132/78  10/11/18 110/80    Physical Exam Vitals signs and nursing note reviewed.  Constitutional:      General: She is not in acute distress.    Appearance: Normal appearance. She is well-developed.  HENT:     Head: Normocephalic and atraumatic.  Neck:     Musculoskeletal: Normal range of motion.  Cardiovascular:     Rate and Rhythm: Normal rate and regular rhythm.     Heart sounds: No murmur.  Pulmonary:     Effort: Pulmonary effort is normal. No prolonged expiration, respiratory distress or retractions.     Breath sounds: Normal breath sounds. No wheezing or rhonchi.  Musculoskeletal: Normal range of motion.     Right lower leg: No edema.     Left lower leg: No edema.  Lymphadenopathy:     Cervical: No cervical adenopathy.  Skin:    General: Skin is warm and dry.     Findings: No rash.  Neurological:     Mental Status: She is alert and oriented to person, place, and time.  Psychiatric:        Behavior: Behavior normal.        Thought Content: Thought content normal.     Wt Readings from Last 3 Encounters:  03/13/19 237 lb (107.5 kg)  12/30/18 225 lb (102.1 kg)  10/11/18 235 lb (106.6 kg)    BP 134/88   Pulse 70   Temp 98.3 F (36.8 C) (Oral)   Ht  5\' 4"  (1.626 m)   Wt 237 lb (107.5 kg)   SpO2 97%   BMI 40.68 kg/m   Assessment and Plan: 1. Asthma in adult without complication, unspecified asthma severity, unspecified whether persistent Mild asthma exacerbation Pt declines steroid taper Take Singulair every day Use albuterol MDI up to qid for chest tightness Call if use of albuterol does not decrease over the next few weeks - albuterol (VENTOLIN HFA) 108 (90 Base) MCG/ACT inhaler; Inhale 2 puffs into the lungs every 6 (six) hours as needed for wheezing or shortness of breath.  Dispense: 54 g; Refill: 0   Partially dictated using Editor, commissioning. Any errors are unintentional.  Halina Maidens, MD [facility]  03/13/2019

## 2019-03-17 ENCOUNTER — Ambulatory Visit
Admission: RE | Admit: 2019-03-17 | Discharge: 2019-03-17 | Disposition: A | Discharge status: Home / Self Care | Payer: 59 | Source: Ambulatory Visit | Attending: Otolaryngology | Admitting: Otolaryngology

## 2019-03-17 ENCOUNTER — Other Ambulatory Visit: Payer: Self-pay

## 2019-03-17 DIAGNOSIS — R221 Localized swelling, mass and lump, neck: Secondary | ICD-10-CM | POA: Insufficient documentation

## 2019-03-17 MED ORDER — IOHEXOL 300 MG/ML  SOLN
75.0000 mL | Freq: Once | INTRAMUSCULAR | Status: AC | PRN
  Administered 2019-03-17: 75 mL via INTRAVENOUS

## 2019-03-18 NOTE — Progress Notes (Signed)
Pt presents with mycotic infection of nails 1-5 bilateral  All other systems are negative  Laser therapy administered to affected nails and tolerated well. All safety precautions were in place.  5th treatment.  Follow up in 4 weeks

## 2019-03-21 ENCOUNTER — Other Ambulatory Visit: Payer: Self-pay | Admitting: Endocrinology

## 2019-03-21 MED FILL — SYNTHROID 175 MCG TABLET: 175 | 30 days supply | Qty: 30 | Fill #0

## 2019-03-21 MED FILL — LIOTHYRONINE SODIUM 5 MCG T: 5 | 30 days supply | Qty: 90 | Fill #0

## 2019-03-26 ENCOUNTER — Telehealth (INDEPENDENT_AMBULATORY_CARE_PROVIDER_SITE_OTHER): Payer: 59

## 2019-03-26 DIAGNOSIS — R3915 Urgency of urination: Secondary | ICD-10-CM | POA: Diagnosis not present

## 2019-03-26 LAB — POCT URINALYSIS DIPSTICK
Bilirubin, UA: NEGATIVE
Glucose, UA: NEGATIVE
Ketones, UA: NEGATIVE
Leukocytes, UA: NEGATIVE
Nitrite, UA: NEGATIVE
Protein, UA: NEGATIVE
Spec Grav, UA: 1.02 (ref 1.010–1.025)
Urobilinogen, UA: 0.2 E.U./dL
pH, UA: 5 (ref 5.0–8.0)

## 2019-03-26 NOTE — Telephone Encounter (Signed)
Patient brought in urine to be dipped just for ONE TIME since she is working 12 hours shifts and unable to make appt at this time. Told her we cannot do this again.  Checked Urine: Put in chart.   Dr. Army Melia looked under microscope and found very few WBC, trace Bacteria, Few Epithelial Cells, and NO RBCs. Called and spoke with patient and she verbalized understanding that abx are not needed.

## 2019-04-07 ENCOUNTER — Ambulatory Visit: Payer: 59 | Admitting: Endocrinology

## 2019-04-10 NOTE — Progress Notes (Signed)
04/11/2019 11:07 AM   [name] 1975-09-16 [id number]  Referring provider: Glean Hess, MD [address] [place],  [place] [address]  Chief Complaint  Patient presents with  . Urinary Frequency    HPI: Hailey [name] is a 43 year old female with IC and urinary frequency who presents today with urinary complaints.    She has been recently diagnosed with HLA-B27 and states this is known to contribute to her painful urinary symptoms.  She states when she was on a prednisone dose-pack and on Humara,  her urinary symptoms abated.  She does not want to take prednisone chronically and the Humara caused severe joint inflammation, so she had to discontinue the medication.  She is now taking Cytomel, but it does not help with her urinary symptoms.  She is experiencing frequency and painful urination.  She states that her urethra always feels like it is on fire.  This has been occurring for months.  Patient denies any gross hematuria, dysuria or suprapubic/flank pain.  Patient denies any fevers, chills, nausea or vomiting.  Her UA is negative.  Her PVR is 0 mL.    PMH: Past Medical History:  Diagnosis Date  . Allergic state   . Allergy   . Arthritis   . Asthma   . Cervical disc disease   . Depression   . GERD (gastroesophageal reflux disease)   . H/O colonoscopy   . HPV (human papilloma virus) infection   . Hypothyroidism   . Interstitial cystitis   . LGSIL (low grade squamous intraepithelial dysplasia)   . Paresthesia   . Status post laparoscopic cholecystectomy 07/13/2016    Surgical History: Past Surgical History:  Procedure Laterality Date  . BREAST BIOPSY Right 10/18/2015   bx/clip- neg  . CHOLECYSTECTOMY N/A 07/07/2016   Procedure: LAPAROSCOPIC CHOLECYSTECTOMY;  Surgeon: Olean Ree, MD;  Location: [facility] ORS;  Service: General;  Laterality: N/A;  . COLONOSCOPY WITH PROPOFOL N/A 10/11/2015   Procedure: COLONOSCOPY WITH PROPOFOL;  Surgeon: Lollie Sails, MD;  Location[facility] ENDOSCOPY;  Service: Endoscopy;  Laterality: N/A;  . ESOPHAGOGASTRODUODENOSCOPY (EGD) WITH PROPOFOL N/A 10/11/2015   Procedure: ESOPHAGOGASTRODUODENOSCOPY (EGD) WITH PROPOFOL;  Surgeon: Lollie Sails, MD;  Location: [facility] ENDOSCOPY;  Service: Endoscopy;  Laterality: N/A;  . TONSILLECTOMY    . TOTAL THYROIDECTOMY  2005   goiter    Home Medications:  Allergies as of 04/11/2019      Reactions   Codeine Other (See Comments)   Hullacinations   Hydrocodone-acetaminophen Other (See Comments)   Cannot urinate on medication   Lodine [etodolac] Shortness Of Breath   Quetiapine Other (See Comments)   Excessive sedation at 50 mg.   Zanaflex [tizanidine Hcl]    Ciprofloxacin Hives   Escitalopram Other (See Comments)   Intolerant.   Latex Rash   Levaquin [levofloxacin In D5w] Rash   HIVES      Medication List       Accurate as of April 11, 2019 11:07 AM. If you have any questions, ask your nurse or doctor.        albuterol 108 (90 Base) MCG/ACT inhaler Commonly known as: VENTOLIN HFA Inhale 2 puffs into the lungs every 6 (six) hours as needed for wheezing or shortness of breath.   amitriptyline 10 MG tablet Commonly known as: ELAVIL Take 10 mg by mouth at bedtime as needed (Bladder spasms).   clobetasol 0.05 % Gel Commonly known as: TEMOVATE Apply twice daily Started by:  Drevon Plog, PA-C   Cosentyx Sensoready Pen 150 MG/ML Soaj Generic drug: Secukinumab Inject 1 mL into the skin once a week.   ibuprofen 200 MG tablet Commonly known as: ADVIL Take 200 mg by mouth every 6 (six) hours as needed.   levothyroxine 175 MCG tablet Commonly known as: SYNTHROID Take 1 tablet (175 mcg total) by mouth daily before breakfast.   liothyronine 5 MCG tablet Commonly known as: CYTOMEL TAKE 2 TABLETS BEFORE BREAKFAST AND 1 TABLET BEFORE LUNCH.   montelukast 10 MG tablet Commonly known as: SINGULAIR Take 1 tablet (10 mg total) by mouth at bedtime.        Allergies:  Allergies  Allergen Reactions  . Codeine Other (See Comments)    Hullacinations  . Hydrocodone-Acetaminophen Other (See Comments)    Cannot urinate on medication  . Lodine [Etodolac] Shortness Of Breath  . Quetiapine Other (See Comments)    Excessive sedation at 50 mg.  . Zanaflex [Tizanidine Hcl]   . Ciprofloxacin Hives  . Escitalopram Other (See Comments)    Intolerant.  . Latex Rash  . Levaquin [Levofloxacin In D5w] Rash    HIVES    Family History: Family History  Problem Relation Age of Onset  . Hypertension Mother   . Stroke Mother   . Cancer Mother   . Goiter Mother   . Diabetes Father   . Hypertension Father   . Hypertension Brother   . Hypertension Maternal Grandmother   . Stroke Maternal Grandmother   . Diabetes Maternal Grandmother   . Breast cancer Cousin 37       pat cousin    Social History:  reports that she quit smoking about 17 years ago. Her smoking use included cigarettes. She has a 3.50 pack-year smoking history. She has never used smokeless tobacco. She reports current alcohol use. She reports that she does not use drugs.  ROS: UROLOGY Frequent Urination?: Yes Hard to postpone urination?: No Burning/pain with urination?: Yes Get up at night to urinate?: No Leakage of urine?: No Urine stream starts and stops?: No Trouble starting stream?: No Do you have to strain to urinate?: No Blood in urine?: No Urinary tract infection?: No Sexually transmitted disease?: No Injury to kidneys or bladder?: No Painful intercourse?: No Weak stream?: No Currently pregnant?: No Vaginal bleeding?: No Last menstrual period?: n  Gastrointestinal Nausea?: No Vomiting?: No Indigestion/heartburn?: No Diarrhea?: No Constipation?: No  Constitutional Fever: No Night sweats?: No Weight loss?: No Fatigue?: Yes  Skin Skin rash/lesions?: No Itching?: No  Eyes Blurred vision?: No Double vision?: No  Ears/Nose/Throat Sore throat?:  No Sinus problems?: No  Hematologic/Lymphatic Swollen glands?: No Easy bruising?: No  Cardiovascular Leg swelling?: No  Respiratory Cough?: No Shortness of breath?: No  Endocrine Excessive thirst?: No  Musculoskeletal Back pain?: No Joint pain?: Yes  Neurological Headaches?: No Dizziness?: No  Psychologic Depression?: No Anxiety?: No  Physical Exam: BP 113/77 (BP Location: Left Arm, Patient Position: Sitting)   Pulse 72   Ht '5\' 4"'  (1.626 m)   Wt 234 lb (106.1 kg)   BMI 40.17 kg/m   Constitutional:  Well nourished. Alert and oriented, No acute distress. HEENT: [place] AT, moist mucus membranes.  Trachea midline, no masses. Cardiovascular: No clubbing, cyanosis, or edema. Respiratory: Normal respiratory effort, no increased work of breathing. GI: Abdomen is soft, non tender, non distended, no abdominal masses. Liver and spleen not palpable.  No hernias appreciated.  Stool sample for occult testing is not indicated.   GU:  No CVA tenderness.  No bladder fullness or masses.  Normal external genitalia, normal pubic hair distribution, no lesions.  Normal urethral meatus, no lesions, no prolapse, no discharge.   No urethral masses, tenderness and/or tenderness. No bladder fullness, tenderness or masses. normal vagina mucosa, good estrogen effect, no discharge, no lesions, fair pelvic support, no cystocele and no rectocele noted.  No cervical motion tenderness.  Uterus is freely mobile and non-fixed.  No adnexal/parametria masses or tenderness noted.  Anus and perineum are without rashes or lesions.    Skin: No rashes, bruises or suspicious lesions. Lymph: No inguinal adenopathy. Neurologic: Grossly intact, no focal deficits, moving all 4 extremities. Psychiatric: Normal mood and affect.   Laboratory Data: Lab Results  Component Value Date   WBC 8.5 12/02/2018   HGB 13.9 12/02/2018   HCT 42 12/02/2018   MCV 87.2 09/17/2016   PLT 416 (A) 12/02/2018    Lab Results  Component  Value Date   CREATININE 0.80 09/17/2016    No results found for: PSA  No results found for: TESTOSTERONE  Lab Results  Component Value Date   HGBA1C 5.5 01/26/2015    Lab Results  Component Value Date   TSH 5.523 (H) 02/05/2019    No results found for: CHOL, HDL, CHOLHDL, VLDL, LDLCALC  Lab Results  Component Value Date   AST 15 12/02/2018   Lab Results  Component Value Date   ALT 17 12/02/2018   No components found for: ALKALINEPHOPHATASE No components found for: BILIRUBINTOTAL  No results found for: ESTRADIOL  Urinalysis Component     Latest Ref Rng & Units 04/11/2019  Specific Gravity, UA     1.005 - 1.030 >1.030 (H)  pH, UA     5.0 - 7.5 5.5  Color, UA     Yellow Yellow  Appearance Ur     Clear Hazy (A)  Leukocytes,UA     Negative Negative  Protein,UA     Negative/Trace Negative  Glucose, UA     Negative Negative  Ketones, UA     Negative Negative  RBC, UA     Negative Negative  Bilirubin, UA     Negative Negative  Urobilinogen, Ur     0.2 - 1.0 mg/dL 0.2  Nitrite, UA     Negative Negative  Microscopic Examination      See below:   Component     Latest Ref Rng & Units 04/11/2019  WBC, UA     0 - 5 /hpf None seen  RBC     0 - 2 /hpf None seen  Epithelial Cells (non renal)     0 - 10 /hpf 0-10  Mucus, UA     Not Estab. Present (A)  Bacteria, UA     None seen/Few Few    I have reviewed the labs.   Pertinent Imaging: Results for SALIYAH, GILLIN (MRN [id number]) as of 04/17/2019 10:36  Ref. Range 04/11/2019 10:33  Scan Result Unknown 0  i   Assessment & Plan:    1. IC New diagnosis of HLA-B27 which may contribute to symptoms as patient states that it can result in urethritis  The limited research I was able to conduct after the patient left recommend screening for chlamydia  UA is clear, but will send for culture  As prednisone was helpful, prescribed clobetasol to help with irritation  We will have her return for rescue solution  as they have been helpful in the past   Return for  rescue solution next week .  These notes generated with voice recognition software. I apologize for typographical errors.  Zara Council, PA-C  [facility] [address]  [place], [place] [address] [phone]  I spent 25 with this patient in a face to face conversation of which greater than 50% was spent in counseling and coordination of care with the patient.

## 2019-04-11 ENCOUNTER — Ambulatory Visit: Payer: 59 | Admitting: Urology

## 2019-04-11 ENCOUNTER — Other Ambulatory Visit: Payer: Self-pay

## 2019-04-11 ENCOUNTER — Encounter: Payer: Self-pay | Admitting: Urology

## 2019-04-11 VITALS — BP 113/77 | HR 72 | Ht 64.0 in | Wt 234.0 lb

## 2019-04-11 DIAGNOSIS — N3010 Interstitial cystitis (chronic) without hematuria: Secondary | ICD-10-CM

## 2019-04-11 LAB — URINALYSIS, COMPLETE
Bilirubin, UA: NEGATIVE
Glucose, UA: NEGATIVE
Ketones, UA: NEGATIVE
Leukocytes,UA: NEGATIVE
Nitrite, UA: NEGATIVE
Protein,UA: NEGATIVE
RBC, UA: NEGATIVE
Specific Gravity, UA: >1.03 — ABNORMAL HIGH (ref 1.005–1.030)
Urobilinogen, Ur: 0.2 mg/dL (ref 0.2–1.0)
pH, UA: 5.5 (ref 5.0–7.5)

## 2019-04-11 LAB — MICROSCOPIC EXAMINATION
RBC, Urine: NONE SEEN /hpf (ref 0–2)
WBC, UA: NONE SEEN /hpf (ref 0–5)

## 2019-04-11 LAB — BLADDER SCAN AMB NON-IMAGING: Scan Result: 0

## 2019-04-11 MED ORDER — CLOBETASOL PROPIONATE 0.05 % EX GEL: CUTANEOUS | 3 refills | Status: DC

## 2019-04-13 LAB — CULTURE, URINE COMPREHENSIVE

## 2019-04-17 ENCOUNTER — Other Ambulatory Visit
Admission: RE | Admit: 2019-04-17 | Discharge: 2019-04-17 | Disposition: A | Discharge status: Home / Self Care | Payer: 59 | Attending: Endocrinology | Admitting: Endocrinology

## 2019-04-17 DIAGNOSIS — E890 Postprocedural hypothyroidism: Secondary | ICD-10-CM | POA: Diagnosis not present

## 2019-04-17 LAB — TSH: TSH: 2.681 u[IU]/mL (ref 0.350–4.500)

## 2019-04-17 LAB — T4, FREE: Free T4: 0.95 ng/dL (ref 0.61–1.12)

## 2019-04-17 MED FILL — SYNTHROID 175 MCG TABLET: 175 | 30 days supply | Qty: 30 | Fill #1

## 2019-04-17 NOTE — Addendum Note (Signed)
Addended by: Janece Canterbury on: 04/17/2019 03:09 PM   Modules accepted: Orders

## 2019-04-18 ENCOUNTER — Ambulatory Visit: Payer: 59 | Admitting: Urology

## 2019-04-18 ENCOUNTER — Other Ambulatory Visit: Payer: Self-pay | Admitting: Urology

## 2019-04-18 LAB — T3, FREE: T3, Free: 3 pg/mL (ref 2.0–4.4)

## 2019-04-18 MED FILL — COSENTYX 150 MG/ML PEN INJE: 150 | 7 days supply | Qty: 1 | Fill #0

## 2019-04-20 NOTE — Progress Notes (Signed)
Patient ID: Hailey [name], female   DOB: [birth year], 43 y.o.   MRN: [id number]             Referring Physician: Sabra Heck  Today's office visit was provided via telemedicine using video technique The patient was explained the limitations of evaluation and management by telemedicine and the availability of in person appointments.  The patient understood the limitations and agreed to proceed. Patient also understood that the telehealth visit is billable.  Location of the patient: Patient's home  Location of the provider: Physician office Only the patient and myself were participating in the encounter    Reason for Appointment:  Hypothyroidism, follow-up visit    History of Present Illness:    Hypothyroidism was first diagnosed in 2006 after thyroidectomy. After her thyroid surgery she was put on levothyroxine, unknown doses Subsequently she went to an endocrinologist who felt that she will do better with a combination of Cytomel and levothyroxine since she was having weight gain The patient does not think she felt any better or had any significant weight loss with this regimen but this was continued until 04/2016 She had gone to another physician for a second opinion and she was switched to Synthroid from her previous regimen of 88 g levothyroxine and 25 g of Cytomel because of her free T3 level being high at 5.8  On her initial consultation she was complaining about weight gain, joint pains and fatigue Since her TSH was 14.7 her dose was increased from the previous 150 g up to 200 g  Recent history: Since she was having significant fatigue with 200 g Synthroid alone she has been on a regimen of Cytomel daily along with Synthroid 150 g daily since 2018  She has done better subjectively with this compared to Synthroid alone She was asked to take an extra 5 g of Cytomel at lunchtime in addition when she was seen in July 2018 since she had upper normal TSH and her symptoms were  persistent  She does take her morning levothyroxine and liothyronine daily consistently  Her TSH was checked by her rheumatologist in March 2020 but this report was not available to me.  The level was 11.9 Subsequently with no changes in her treatment her TSH was 5.5 in 5/20  She was told to try additional 5 mcg of liothyronine at lunch time because of her complaints of fatigue and afternoon somnolence However with this she felt she had more difficulty with sleep and this.  Her levothyroxine was increased to 175 mcg instead of 159  Subsequently she is having only some problems with hair loss but this has been going on for 2 months She also is on a new medication   She does not think she has any significant fatigue currently but may feel a little cold.  Labs show her TSH to be back to normal with slight improvement in free T4 level and stable T3   Patient's weight history is as follows:  Wt Readings from Last 3 Encounters:  04/11/19 234 lb (106.1 kg)  03/13/19 237 lb (107.5 kg)  02/07/19 240 lb 6.4 oz (109 kg)    Thyroid function results have been as follows:  Lab Results  Component Value Date   FREET4 0.95 04/17/2019   FREET4 0.86 02/05/2019   FREET4 0.81 (L) 08/01/2018   TSH 2.681 04/17/2019   TSH 5.523 (H) 02/05/2019   TSH 11.90 (A) 12/02/2018   Lab Results  Component Value Date   T3FREE 3.0 04/17/2019  T3FREE 3.3 02/05/2019   T3FREE 3.8 08/01/2018   T3FREE 2.9 02/05/2018    The following is a copy of her surgical assessment prior to her thyroid surgery:  Patient has a history of hyperthyroidism who had a dominant right thyroid nodule identified by her endocrinologist. This nodule is photopenic and ultrasound guided biopsy has demonstrated a follicular lesion with scant colloid. 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 Her endocrinologist has recommended subtotal  thyroidectomy to take care of both the nodule in the right thyroid lobe as well as the hyperfunctioning left thyroid tissue  Apparently she had Hashimoto's thyroiditis in the left lobe on pathology  Past Medical History:  Diagnosis Date   Allergic state    Allergy    Arthritis    Asthma    Cervical disc disease    Depression    GERD (gastroesophageal reflux disease)    H/O colonoscopy    HPV (human papilloma virus) infection    Hypothyroidism    Interstitial cystitis    LGSIL (low grade squamous intraepithelial dysplasia)    Paresthesia    Status post laparoscopic cholecystectomy 07/13/2016    Past Surgical History:  Procedure Laterality Date   BREAST BIOPSY Right 10/18/2015   bx/clip- neg   CHOLECYSTECTOMY N/A 07/07/2016   Procedure: LAPAROSCOPIC CHOLECYSTECTOMY;  Surgeon: Olean Ree, MD;  Location: [facility] ORS;  Service: General;  Laterality: N/A;   COLONOSCOPY WITH PROPOFOL N/A 10/11/2015   Procedure: COLONOSCOPY WITH PROPOFOL;  Surgeon: Lollie Sails, MD;  Location: [facility] ENDOSCOPY;  Service: Endoscopy;  Laterality: N/A;   ESOPHAGOGASTRODUODENOSCOPY (EGD) WITH PROPOFOL N/A 10/11/2015   Procedure: ESOPHAGOGASTRODUODENOSCOPY (EGD) WITH PROPOFOL;  Surgeon: Lollie Sails, MD;  Location: [facility] ENDOSCOPY;  Service: Endoscopy;  Laterality: N/A;   TONSILLECTOMY     TOTAL THYROIDECTOMY  2005   goiter    Family History  Problem Relation Age of Onset   Hypertension Mother    Stroke Mother    Cancer Mother    Goiter Mother    Diabetes Father    Hypertension Father    Hypertension Brother    Hypertension Maternal Grandmother    Stroke Maternal Grandmother    Diabetes Maternal Grandmother    Breast cancer Cousin 35       pat cousin    Social History:  reports that she quit smoking about 17 years ago. Her smoking use included cigarettes. She has a 3.50 pack-year smoking history. She has never used smokeless tobacco. She reports current  alcohol use. She reports that she does not use drugs.  Allergies:  Allergies  Allergen Reactions   Codeine Other (See Comments)    Hullacinations   Hydrocodone-Acetaminophen Other (See Comments)    Cannot urinate on medication   Lodine [Etodolac] Shortness Of Breath   Quetiapine Other (See Comments)    Excessive sedation at 50 mg.   Zanaflex [Tizanidine Hcl]    Ciprofloxacin Hives   Escitalopram Other (See Comments)    Intolerant.   Latex Rash   Levaquin [Levofloxacin In D5w] Rash    HIVES    Allergies as of 04/21/2019      Reactions   Codeine Other (See Comments)   Hullacinations   Hydrocodone-acetaminophen Other (See Comments)   Cannot urinate on medication   Lodine [etodolac] Shortness Of Breath   Quetiapine Other (See Comments)   Excessive sedation at 50 mg.   Zanaflex [tizanidine Hcl]    Ciprofloxacin Hives  Escitalopram Other (See Comments)   Intolerant.   Latex Rash   Levaquin [levofloxacin In D5w] Rash   HIVES      Medication List       Accurate as of April 20, 2019  9:15 PM. If you have any questions, ask your nurse or doctor.        albuterol 108 (90 Base) MCG/ACT inhaler Commonly known as: VENTOLIN HFA Inhale 2 puffs into the lungs every 6 (six) hours as needed for wheezing or shortness of breath.   amitriptyline 10 MG tablet Commonly known as: ELAVIL Take 10 mg by mouth at bedtime as needed (Bladder spasms).   clobetasol 0.05 % Gel Commonly known as: TEMOVATE Apply twice daily   Cosentyx Sensoready Pen 150 MG/ML Soaj Generic drug: Secukinumab Inject 1 mL into the skin once a week.   ibuprofen 200 MG tablet Commonly known as: ADVIL Take 200 mg by mouth every 6 (six) hours as needed.   levothyroxine 175 MCG tablet Commonly known as: SYNTHROID Take 1 tablet (175 mcg total) by mouth daily before breakfast.   liothyronine 5 MCG tablet Commonly known as: CYTOMEL TAKE 2 TABLETS BEFORE BREAKFAST AND 1 TABLET BEFORE LUNCH.     montelukast 10 MG tablet Commonly known as: SINGULAIR Take 1 tablet (10 mg total) by mouth at bedtime.          Review of Systems       She is on amitriptyline 10 mg for overactive bladder and interstitial cystitis,  She has history of depression in the past and does not like to take medications, she thinks Lexapro caused bladder symptoms and higher doses  She has knee pains and may have ankylosing spondylitis, followed by rheumatologist now        Examination:    There were no vitals taken for this visit.  Exam not indicated   Assessment:  HYPOTHYROIDISM, Postsurgical  She has been on levothyroxine 175 mcg and liothyronine 10 mcg daily  She was tried on additional liothyronine because of her fatigue but she could not tolerate this because of insomnia apparently However with using 25 mcg more of the levothyroxine her fatigue is improving She is still having some nonspecific joint pains in her knees but no generalized aching as much as before  She has been compliant with her supplement very regularly as prescribed  TSH is now back to normal as also T3 and T4 levels  Hair loss: This may have started when her thyroid was underactive for a couple of months and springtime and may improve Otherwise may need to consult a dermatologist   PLAN:   She will now take her Cytomel 15 mcg consistently together before breakfast No change in levothyroxine Again reminded her that she should not take any calcium or iron at the same time as her thyroid supplements  She will report any effects such as excessive anxiety or palpitation with doing this  She will follow-up in 6 months unless she has any recurrence of unusual fatigue  Elayne Snare 04/20/2019, 9:15 PM     Note: This office note was prepared with Dragon voice recognition system technology. Any transcriptional errors that result from this process are unintentional.   Elayne Snare

## 2019-04-21 ENCOUNTER — Other Ambulatory Visit: Payer: Self-pay

## 2019-04-21 ENCOUNTER — Encounter: Payer: Self-pay | Admitting: Endocrinology

## 2019-04-21 ENCOUNTER — Ambulatory Visit (INDEPENDENT_AMBULATORY_CARE_PROVIDER_SITE_OTHER): Payer: 59 | Admitting: Endocrinology

## 2019-04-21 ENCOUNTER — Other Ambulatory Visit: Payer: Self-pay | Admitting: Urology

## 2019-04-21 DIAGNOSIS — E063 Autoimmune thyroiditis: Secondary | ICD-10-CM | POA: Diagnosis not present

## 2019-04-22 NOTE — Telephone Encounter (Signed)
Ok to refill 

## 2019-04-24 ENCOUNTER — Other Ambulatory Visit: Payer: Self-pay | Admitting: Urology

## 2019-04-24 NOTE — Telephone Encounter (Signed)
Pt asking for refill of Amitriptyline for IC, says it takes the edge off. Please send Rx to [facility]. Please advise. Thanks.

## 2019-05-05 ENCOUNTER — Other Ambulatory Visit: Payer: Self-pay | Admitting: Endocrinology

## 2019-05-09 ENCOUNTER — Other Ambulatory Visit: Payer: Self-pay

## 2019-05-09 ENCOUNTER — Ambulatory Visit: Payer: 59 | Admitting: Internal Medicine

## 2019-05-09 ENCOUNTER — Encounter: Payer: Self-pay | Admitting: Internal Medicine

## 2019-05-09 VITALS — BP 118/74 | HR 69 | Ht 64.0 in | Wt 234.0 lb

## 2019-05-09 DIAGNOSIS — L309 Dermatitis, unspecified: Secondary | ICD-10-CM | POA: Diagnosis not present

## 2019-05-09 DIAGNOSIS — G44209 Tension-type headache, unspecified, not intractable: Secondary | ICD-10-CM

## 2019-05-09 DIAGNOSIS — L292 Pruritus vulvae: Secondary | ICD-10-CM | POA: Diagnosis not present

## 2019-05-09 DIAGNOSIS — E890 Postprocedural hypothyroidism: Secondary | ICD-10-CM

## 2019-05-09 MED ORDER — CYCLOBENZAPRINE HCL 10 MG PO TABS: 10.0000 mg | ORAL_TABLET | Freq: Every day | ORAL | 0 refills | Status: DC

## 2019-05-09 MED ORDER — BUTALBITAL-APAP-CAFFEINE 50-325-40 MG PO TABS: 1.0000 | ORAL_TABLET | Freq: Four times a day (QID) | ORAL | 0 refills | Status: DC | PRN

## 2019-05-09 NOTE — Progress Notes (Signed)
Date:  05/09/2019   Name:  Hailey [name]   DOB:  06/29/1976   MRN:  [id number]   Chief Complaint: Thyroid Problem (Loss of appetite, and confusion. Endo changed thyroid meds - increased to 175 mcg X 1 month. Having headaches everyday, fatigued, and headache X 2 days now. Feels liek she cannot process her thoughts. Called Endo doctor- he said her labs are fine and no need to recheck. He feels synthroid is not causing fatigue and headaches.)  Headache  This is a new problem. The current episode started 1 to 4 weeks ago. The problem occurs daily. The problem has been unchanged. The pain is located in the frontal and parietal region. The pain does not radiate. The quality of the pain is described as aching and band-like (with sharp pains on the left side of her head). The pain is moderate. Associated symptoms include anorexia, blurred vision, dizziness, nausea, phonophobia, photophobia and scalp tenderness. Pertinent negatives include no coughing, eye pain, fever, loss of balance, sore throat, swollen glands, tinnitus, visual change, vomiting or weight loss. The symptoms are aggravated by activity. She has tried NSAIDs and acetaminophen for the symptoms. Improvement on treatment: temporary relief then headache returns. Her past medical history is significant for migraine headaches. There is no history of cluster headaches, hypertension, recent head traumas or sinus disease.    Review of Systems  Constitutional: Positive for fatigue (can sleep 12 hours and not feel rested). Negative for chills, diaphoresis, fever and weight loss.  HENT: Negative for sore throat and tinnitus.   Eyes: Positive for blurred vision and photophobia. Negative for pain.  Respiratory: Positive for wheezing (but has asthma well controlled). Negative for cough and shortness of breath.   Cardiovascular: Positive for palpitations. Negative for chest pain and leg swelling.  Gastrointestinal: Positive for anorexia and nausea.  Negative for blood in stool, diarrhea and vomiting.  Genitourinary: Negative for dysuria.  Neurological: Positive for dizziness and headaches. Negative for loss of balance.  Hematological: Negative for adenopathy.  Psychiatric/Behavioral: Positive for decreased concentration. Negative for sleep disturbance.    Patient Active Problem List   Diagnosis Date Noted  . Ankylosing spondylitis ([place]) 12/30/2018  . Asthma in adult without complication 99991111  . Chronic diarrhea 12/30/2018  . 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 Interstitial cystitis 07/03/2016  . Pelvic pain in female 07/03/2016  . 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 BMI 38.0-38.9,adult 02/14/2016  . Polyarthralgia 02/14/2016  . GERD (gastroesophageal reflux disease) 02/10/2014    Allergies  Allergen Reactions  . Codeine Other (See Comments)    Hullacinations  . Hydrocodone-Acetaminophen Other (See Comments)    Cannot urinate on medication  . Lodine [Etodolac] Shortness Of Breath  . Quetiapine Other (See Comments)    Excessive sedation at 50 mg.  . Zanaflex [Tizanidine Hcl]   . Ciprofloxacin Hives  . Escitalopram Other (See Comments)    Intolerant.  . Latex Rash  . Levaquin [Levofloxacin In D5w] Rash    HIVES    Past Surgical History:  Procedure Laterality Date  . BREAST BIOPSY Right 10/18/2015   bx/clip- neg  . CHOLECYSTECTOMY N/A 07/07/2016   Procedure: LAPAROSCOPIC CHOLECYSTECTOMY;  Surgeon: Olean Ree, MD;  Location: [facility] ORS;  Service: General;  Laterality: N/A;  . COLONOSCOPY WITH PROPOFOL N/A 10/11/2015   Procedure: COLONOSCOPY WITH PROPOFOL;  Surgeon: Lollie Sails, MD;  [facility] ENDOSCOPY;  Service: Endoscopy;  Laterality: N/A;  . ESOPHAGOGASTRODUODENOSCOPY (EGD) WITH PROPOFOL N/A 10/11/2015   Procedure: ESOPHAGOGASTRODUODENOSCOPY (  EGD) WITH PROPOFOL;  Surgeon: Lollie Sails, MD;  Location: [facility] ENDOSCOPY;  Service: Endoscopy;  Laterality: N/A;  . TONSILLECTOMY    .  TOTAL THYROIDECTOMY  2005   goiter    Social History   Tobacco Use  . Smoking status: Former Smoker    Packs/day: 0.50    Years: 7.00    Pack years: 3.50    Types: Cigarettes    Quit date: 2003    Years since quitting: 17.6  . Smokeless tobacco: Never Used  Substance Use Topics  . Alcohol use: Yes    Comment: Occ.  . Drug use: No     Medication list has been reviewed and updated.  Current Meds  Medication Sig  . albuterol (VENTOLIN HFA) 108 (90 Base) MCG/ACT inhaler Inhale 2 puffs into the lungs every 6 (six) hours as needed for wheezing or shortness of breath.  Marland Kitchen amitriptyline (ELAVIL) 10 MG tablet Take 10 mg by mouth at bedtime as needed (Bladder spasms).   . clobetasol (TEMOVATE) 0.05 % GEL Apply twice daily  . ibuprofen (ADVIL) 200 MG tablet Take 200 mg by mouth every 6 (six) hours as needed.  Marland Kitchen liothyronine (CYTOMEL) 5 MCG tablet TAKE 2 TABLETS BEFORE BREAKFAST AND 1 TABLET BEFORE LUNCH.  . montelukast (SINGULAIR) 10 MG tablet Take 1 tablet (10 mg total) by mouth at bedtime.  . Secukinumab (COSENTYX SENSOREADY PEN) 150 MG/ML SOAJ Inject 1 mL into the skin once a week.  Marland Kitchen SYNTHROID 175 MCG tablet TAKE 1 TABLET BY MOUTH DAILY BEFORE BREAKFAST.    PHQ 2/9 Scores 05/09/2019 03/13/2019 12/30/2018  PHQ - 2 Score 0 0 0  PHQ- 9 Score 12 0 -    BP Readings from Last 3 Encounters:  05/09/19 118/74  04/11/19 113/77  03/13/19 134/88    Physical Exam Vitals signs and nursing note reviewed.  Constitutional:      General: She is not in acute distress.    Appearance: She is well-developed. She is ill-appearing.  HENT:     Head: Normocephalic and atraumatic.  Eyes:     General: Lids are normal.     Extraocular Movements: Extraocular movements intact.     Right eye: No nystagmus.     Left eye: No nystagmus.  Neck:     Musculoskeletal: Normal range of motion and neck supple.     Thyroid: No thyroid mass.     Vascular: No carotid bruit.  Cardiovascular:     Rate and  Rhythm: Normal rate and regular rhythm.     Pulses: Normal pulses.          Radial pulses are 2+ on the right side and 2+ on the left side.     Heart sounds: No murmur.  Pulmonary:     Effort: Pulmonary effort is normal. No respiratory distress.  Musculoskeletal: Normal range of motion.     Right lower leg: No edema.     Left lower leg: No edema.  Lymphadenopathy:     Cervical: No cervical adenopathy.  Skin:    General: Skin is warm and dry.     Capillary Refill: Capillary refill takes less than 2 seconds.     Findings: No lesion or rash.  Neurological:     General: No focal deficit present.     Mental Status: She is alert and oriented to person, place, and time.     Cranial Nerves: Cranial nerves are intact. No dysarthria.     Sensory: Sensation is intact.  Motor: Motor function is intact. No tremor.     Comments: Tender over forehead and left temporal region No cord, no TMJ click  Psychiatric:        Attention and Perception: Attention and perception normal.        Mood and Affect: Mood normal.        Behavior: Behavior normal.        Thought Content: Thought content normal.        Cognition and Memory: Cognition normal.     Wt Readings from Last 3 Encounters:  05/09/19 234 lb (106.1 kg)  04/11/19 234 lb (106.1 kg)  03/13/19 237 lb (107.5 kg)    BP 118/74   Pulse 69   Ht 5\' 4"  (1.626 m)   Wt 234 lb (106.1 kg)   LMP 04/21/2019 (Exact Date)   SpO2 98%   BMI 40.17 kg/m   Assessment and Plan: 1. Muscle tension headache No worrisome neurological findings at this time - the 2 week duration makes acute process much less likely Take flexeril at bedtime and use fioricet as needed Go to ED if sx are significant worse or not relieved by medication - cyclobenzaprine (FLEXERIL) 10 MG tablet; Take 1 tablet (10 mg total) by mouth at bedtime.  Dispense: 30 tablet; Refill: 0 - butalbital-acetaminophen-caffeine (FIORICET) 50-325-40 MG tablet; Take 1-2 tablets by mouth every 6  (six) hours as needed for headache.  Dispense: 20 tablet; Refill: 0  2. Post-surgical hypothyroidism Continue current supplement dose as prescribed by Endo Consider retesting in 4-6 weeks    Partially dictated using Editor, commissioning. Any errors are unintentional.  Halina Maidens, MD [facility]  05/09/2019

## 2019-05-09 NOTE — Patient Instructions (Signed)
Take flexeril and 2 Advil at bedtime.  Take Fioricet up to 4 times a day as needed for headache  Drink plenty of non caffeinated fluids

## 2019-05-11 MED FILL — SYNTHROID 175 MCG TABLET: 175 | 90 days supply | Qty: 90 | Fill #0

## 2019-05-16 ENCOUNTER — Other Ambulatory Visit: Payer: Self-pay | Admitting: Pharmacist

## 2019-05-16 ENCOUNTER — Other Ambulatory Visit: Payer: Self-pay | Admitting: Internal Medicine

## 2019-05-16 MED ORDER — COSENTYX SENSOREADY PEN 150 MG/ML ~~LOC~~ SOAJ: 1.0000 mL | SUBCUTANEOUS | 0 refills | Status: DC

## 2019-05-16 MED FILL — LIOTHYRONINE SODIUM 5 MCG T: 5 | 30 days supply | Qty: 90 | Fill #1

## 2019-06-02 ENCOUNTER — Other Ambulatory Visit: Payer: Self-pay | Admitting: Pharmacist

## 2019-06-02 DIAGNOSIS — M450 Ankylosing spondylitis of multiple sites in spine: Secondary | ICD-10-CM | POA: Diagnosis not present

## 2019-06-02 DIAGNOSIS — Z6841 Body Mass Index (BMI) 40.0 and over, adult: Secondary | ICD-10-CM | POA: Diagnosis not present

## 2019-06-02 DIAGNOSIS — R5382 Chronic fatigue, unspecified: Secondary | ICD-10-CM | POA: Diagnosis not present

## 2019-06-02 DIAGNOSIS — M2550 Pain in unspecified joint: Secondary | ICD-10-CM | POA: Diagnosis not present

## 2019-06-02 DIAGNOSIS — M545 Low back pain: Secondary | ICD-10-CM | POA: Diagnosis not present

## 2019-06-02 DIAGNOSIS — R899 Unspecified abnormal finding in specimens from other organs, systems and tissues: Secondary | ICD-10-CM | POA: Diagnosis not present

## 2019-06-02 MED ORDER — COSENTYX SENSOREADY PEN 150 MG/ML ~~LOC~~ SOAJ: 2.0000 "pen " | SUBCUTANEOUS | 3 refills | Status: DC

## 2019-06-02 MED FILL — COSENTYX 300 MG DOSE-2 PENS: 150 | 28 days supply | Qty: 2 | Fill #0

## 2019-06-30 DIAGNOSIS — R21 Rash and other nonspecific skin eruption: Secondary | ICD-10-CM | POA: Diagnosis not present

## 2019-07-02 DIAGNOSIS — R21 Rash and other nonspecific skin eruption: Secondary | ICD-10-CM | POA: Diagnosis not present

## 2019-07-02 DIAGNOSIS — D2372 Other benign neoplasm of skin of left lower limb, including hip: Secondary | ICD-10-CM | POA: Diagnosis not present

## 2019-07-02 DIAGNOSIS — D229 Melanocytic nevi, unspecified: Secondary | ICD-10-CM | POA: Diagnosis not present

## 2019-07-04 ENCOUNTER — Ambulatory Visit: Payer: 59 | Admitting: Internal Medicine

## 2019-07-25 ENCOUNTER — Other Ambulatory Visit: Payer: Self-pay

## 2019-07-25 ENCOUNTER — Ambulatory Visit: Payer: 59 | Admitting: Podiatry

## 2019-07-25 ENCOUNTER — Encounter: Payer: Self-pay | Admitting: Podiatry

## 2019-07-25 DIAGNOSIS — B351 Tinea unguium: Secondary | ICD-10-CM

## 2019-07-29 NOTE — Progress Notes (Signed)
   Subjective: 43 y.o. female presenting today for follow up evaluation of nail fungus of the left great toenail. She has had laser treatment with no significant relief. She states the right great toenail is discolored and thickened now. She denies modifying factors. Patient is here for further evaluation and treatment.   Past Medical History:  Diagnosis Date  . Allergic state   . Allergy   . Arthritis   . Asthma   . Cervical disc disease   . Depression   . GERD (gastroesophageal reflux disease)   . H/O colonoscopy   . HPV (human papilloma virus) infection   . Hypothyroidism   . Interstitial cystitis   . LGSIL (low grade squamous intraepithelial dysplasia)   . Paresthesia   . Status post laparoscopic cholecystectomy 07/13/2016    Objective: Physical Exam General: The patient is alert and oriented x3 in no acute distress.  Dermatology: Hyperkeratotic, discolored, thickened, onychodystrophy noted to the bilateral great toenails. Skin is warm, dry and supple bilateral lower extremities. Negative for open lesions or macerations.  Vascular: Palpable pedal pulses bilaterally. No edema or erythema noted. Capillary refill within normal limits.  Neurological: Epicritic and protective threshold grossly intact bilaterally.   Musculoskeletal Exam: Range of motion within normal limits to all pedal and ankle joints bilateral. Muscle strength 5/5 in all groups bilateral.   Assessment: #1 Onychomycosis bilateral great toenails  #2 Hyperkeratotic nails bilateral great toenails   Plan of Care:  #1 Patient was evaluated. #2 Continue taking Sporanox 100 mg BID for 90 days.  #3 OTC Tolcylen topical antifungal provided to patient.  #4 Patient may need refill of Sporanox in the spring with new LFT. #5 Return to clinic as needed.   Does CT scans at [facility]    Edrick Kins, DPM [facility]  Dr. Edrick Kins, [place]                                         [place], [place] [address]                Office [phone]  Fax [phone]

## 2019-08-01 ENCOUNTER — Other Ambulatory Visit: Payer: Self-pay | Admitting: Endocrinology

## 2019-08-04 DIAGNOSIS — R5382 Chronic fatigue, unspecified: Secondary | ICD-10-CM | POA: Diagnosis not present

## 2019-08-04 DIAGNOSIS — M25562 Pain in left knee: Secondary | ICD-10-CM | POA: Diagnosis not present

## 2019-08-04 DIAGNOSIS — R899 Unspecified abnormal finding in specimens from other organs, systems and tissues: Secondary | ICD-10-CM | POA: Diagnosis not present

## 2019-08-04 DIAGNOSIS — M450 Ankylosing spondylitis of multiple sites in spine: Secondary | ICD-10-CM | POA: Diagnosis not present

## 2019-08-04 DIAGNOSIS — Z6841 Body Mass Index (BMI) 40.0 and over, adult: Secondary | ICD-10-CM | POA: Diagnosis not present

## 2019-08-04 DIAGNOSIS — M545 Low back pain: Secondary | ICD-10-CM | POA: Diagnosis not present

## 2019-08-04 DIAGNOSIS — M2550 Pain in unspecified joint: Secondary | ICD-10-CM | POA: Diagnosis not present

## 2019-08-04 MED FILL — LIOTHYRONINE SODIUM 5 MCG T: 5 | 30 days supply | Qty: 90 | Fill #0

## 2019-08-13 ENCOUNTER — Other Ambulatory Visit: Payer: Self-pay

## 2019-08-13 ENCOUNTER — Ambulatory Visit (HOSPITAL_BASED_OUTPATIENT_CLINIC_OR_DEPARTMENT_OTHER): Payer: 59 | Admitting: Pharmacist

## 2019-08-13 DIAGNOSIS — Z79899 Other long term (current) drug therapy: Secondary | ICD-10-CM

## 2019-08-13 MED ORDER — ENBREL SURECLICK 50 MG/ML ~~LOC~~ SOAJ: SUBCUTANEOUS | 3 refills | Status: DC

## 2019-08-13 NOTE — Progress Notes (Signed)
Virtual Visit via Telephone Note  I connected withAngel S. Mashaw on12/2/2020at12:15 PMby telephonedue to the COVID-19 pandemic and verified that I am speaking with the correct person using two identifiers.  Consent: I discussed the limitations, risks, security and privacy concerns of performing an evaluation and management service by telephone and the availability of in person appointments. I also discussed with the patient that there may be a patient responsible charge related to this service. The patient expressed understanding and agreed to proceed.  Location of Patient: Work  Biomedical scientist of Provider: Clinic  Persons participating in Telemedicine visit: Patient Myself  S: Patient presents for review of their specialty medication therapy.  Patient is currently prescribed Enbrel for ankylosing spondylitis. Patient is managed by Leafy Kindle for this.   Adherence: has not started  Efficacy: has not started; previously tired Cosentyx and Humira but failed these.  Dosing:  Ankylosing spondylitis: SubQ: Note: May continue methotrexate, glucocorticoids, salicylates, NSAIDs, or analgesics during etanercept therapy. Once-weekly dosing: 50 mg once weekly  Screening: TB test: completed, WNL per patient  Hepatitis B: completed, WNL per patient   Monitoring: Injection site reactions: has not started; counseling given S/sx of infections: none - has not started; counseling given S/sx of malignancy: none - has not started; counseling given GI upset: has not started; counseling given  O:  Lab Results  Component Value Date   WBC 8.5 12/02/2018   HGB 13.9 12/02/2018   HCT 42 12/02/2018   MCV 87.2 09/17/2016   PLT 416 (A) 12/02/2018      Chemistry      Component Value Date/Time   NA 136 09/17/2016 0927   NA 138 08/27/2014 1257   K 4.3 09/17/2016 0927   K 3.7 08/27/2014 1257   CL 105 09/17/2016 0927   CL 104 08/27/2014 1257   CO2 24 09/17/2016 0927   CO2 23  08/27/2014 1257   BUN 9 09/17/2016 0927   BUN 8 08/27/2014 1257   CREATININE 0.80 09/17/2016 0927   CREATININE 0.69 08/27/2014 1257      Component Value Date/Time   CALCIUM 8.8 (L) 09/17/2016 0927   CALCIUM 8.8 08/27/2014 1257   ALKPHOS 116 12/02/2018   AST 15 12/02/2018   ALT 17 12/02/2018   BILITOT 0.3 10/25/2018 1331       A/P: 1. Medication review: patient currently on Enbrel for ankylosing spondylitis but has not started. Reviewed the medication with the patient, including the following: Enbrel (etanercept) binds tumor necrosis factor (TNF) and blocks its interaction with cell surface receptors. TNF plays an important role in the inflammatory processes of many diseases. Patient educated on purpose, proper use and potential adverse effects of Enbrel. SubQ: Administer subcutaneously. Rotate injection sites; may inject into the thigh (preferred), abdomen (avoiding the 2-inch area around the navel), or outer areas of upper arm. New injections should be given at least 1 inch from an old site and never into areas where the skin is tender, bruised, red, or hard; any raised thick, red, or scaly skin patches or lesions; or areas with scars or stretch marks. For a more comfortable injection, allow autoinjectors, prefilled syringes, and dose trays to reach room temperature for 15 to 30 minutes (?30 minutes for autoinjector) prior to injection; do not remove the needle cover while allowing product to reach room temperature. There may be small Dem[place]ia particles of protein in the solution; this is not unusual for proteinaceous solutions. Possible adverse effects include rash, GI upset, increased risk of infection, and injection  site reactions. Patients should stop Enbrel if they develop a serious infection. There is a possible increased risk in lymphoma and other malignancies. No recommendations for any changes.   Benard Halsted, PharmD, [place] [phone]

## 2019-08-14 IMAGING — CT CT NECK WITH CONTRAST
3 of 4 series · 7 of 14 positions shown, 8 images · IV contrast (omnipaque)
Comparison: None.

CLINICAL DATA: Swelling at the left mastoid for several months.

EXAM:
CT NECK WITH CONTRAST
TECHNIQUE: Multidetector CT imaging of the neck was performed using the
standard protocol following the bolus administration of intravenous
contrast.
CONTRAST:  75mL OMNIPAQUE IOHEXOL 300 MG/ML  SOLN

[Series 2: axial neck · axial · 0.48mm/px · z∈[+222,+296]mm · 2 of 113 slices shown]
[im 38/113  bone]
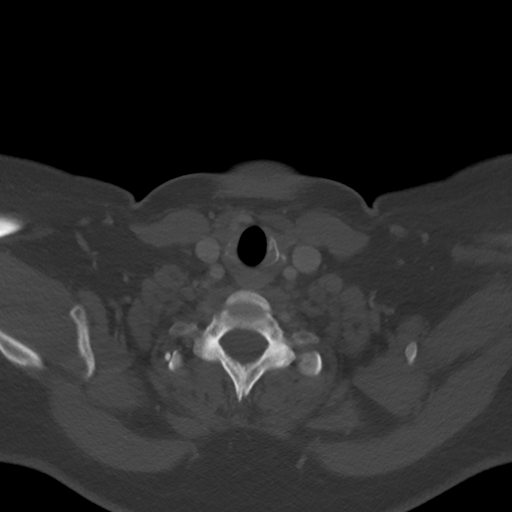
[im 75/113  bone]
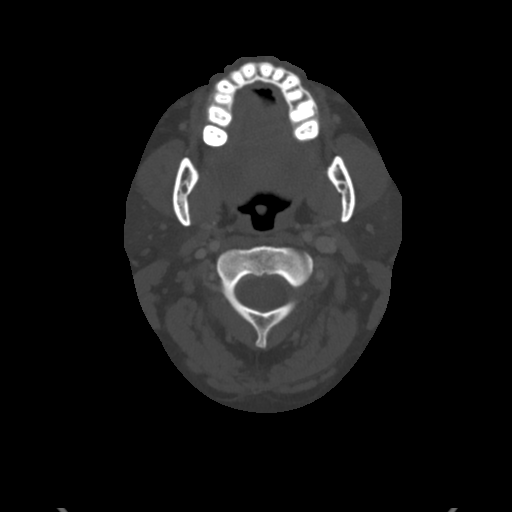

[Series 7: orthogonal ax · axial · 0.56mm/px · z∈[+137,+266]mm · 3 of 154 slices shown, 4 images]
[im 39/154  soft-tissue]
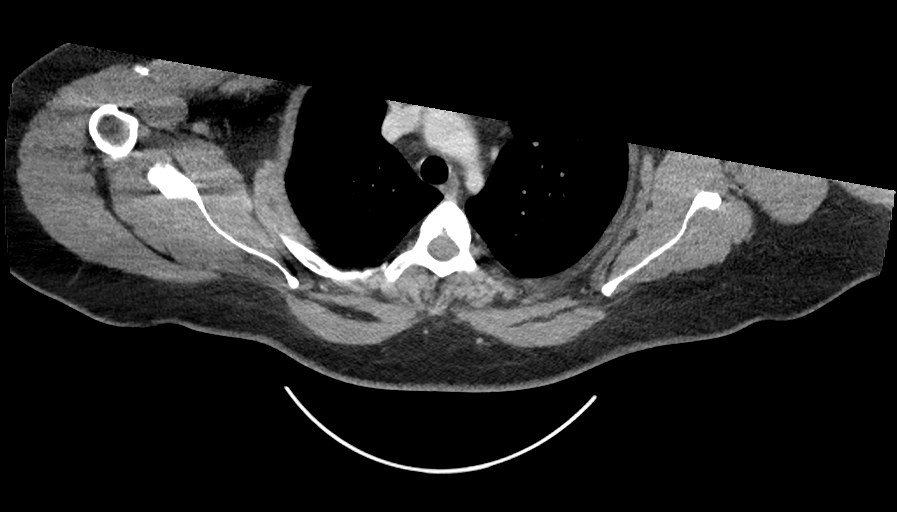
[im 39/154  bone]
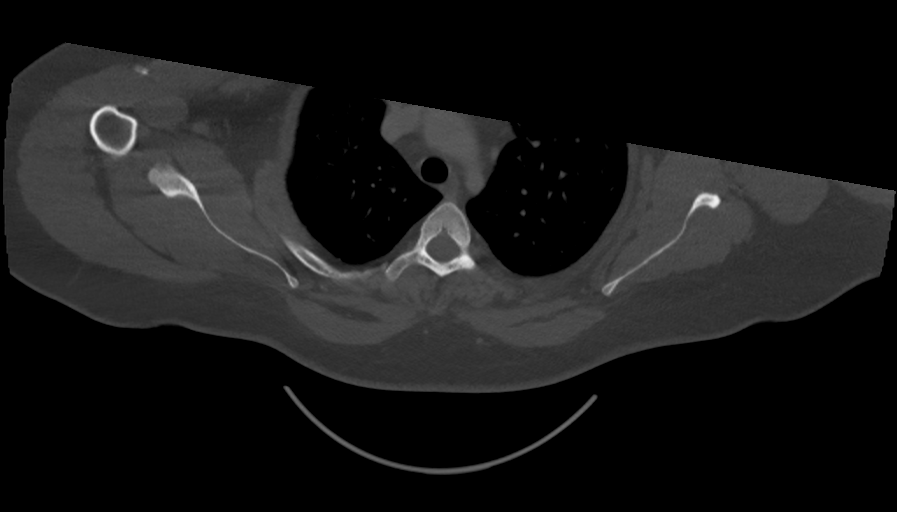
[im 77/154  bone]
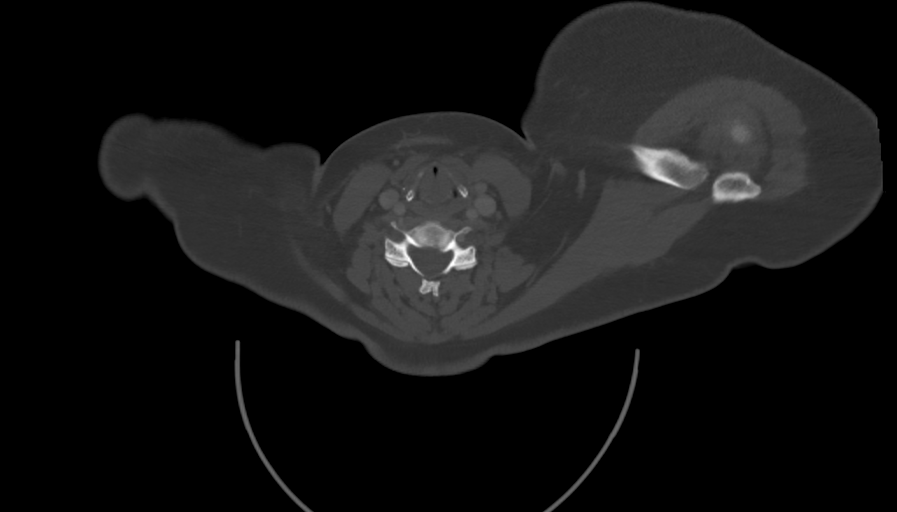
[im 115/154  bone]
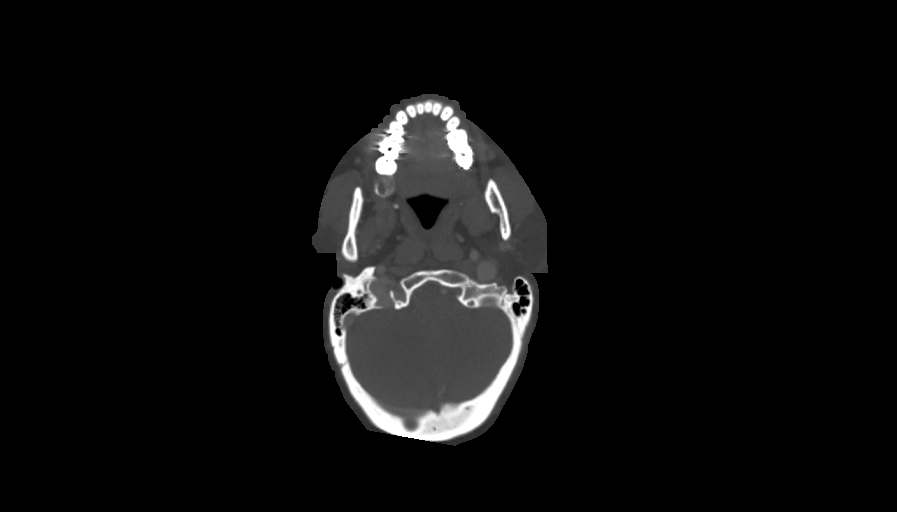

[Series 10: axial neck (person_name) · axial · 0.48mm/px · z∈[+222,+296]mm · 2 of 113 slices shown]
[im 38/113  bone]
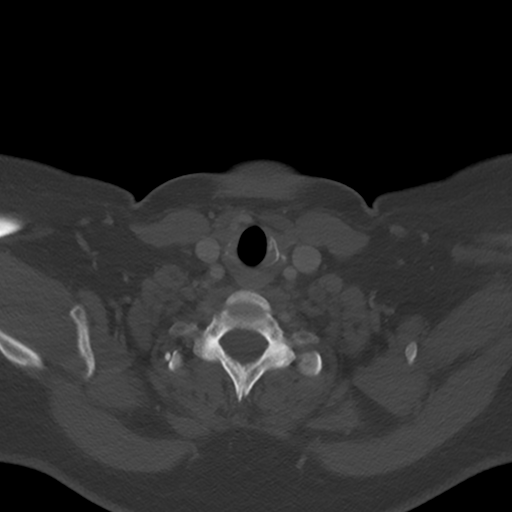
[im 75/113  bone]
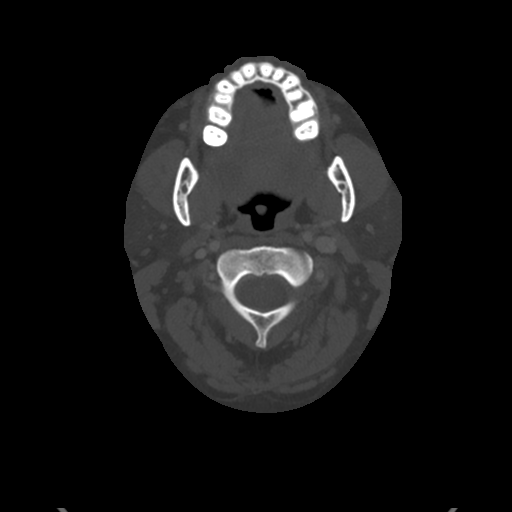

[7 of 14 positions shown; findings below may reference images not displayed]

FINDINGS: Pharynx and larynx: No evidence of mass or swelling

Salivary glands: No inflammation, mass, or stone.

Thyroid: Surgically absent

Lymph nodes: None enlarged or abnormal density.

Vascular: Negative.

Limited intracranial: Negative.

Visualized orbits: Negative.

Mastoids and visualized paranasal sinuses: Clear

Skeleton: No acute or aggressive process.

Upper chest: Negative.

Other: None.
IMPRESSION: Benign appearance of the neck with normal appearance of the left
mastoid.

## 2019-08-18 MED FILL — ENBREL SURECLICK 50 MG/ML S: 50 | 28 days supply | Qty: 4 | Fill #0

## 2019-09-01 ENCOUNTER — Ambulatory Visit: Payer: 59 | Admitting: Internal Medicine

## 2019-09-15 ENCOUNTER — Encounter: Payer: Self-pay | Admitting: Internal Medicine

## 2019-09-15 ENCOUNTER — Other Ambulatory Visit: Payer: Self-pay

## 2019-09-15 ENCOUNTER — Ambulatory Visit: Payer: 59

## 2019-09-15 ENCOUNTER — Ambulatory Visit (INDEPENDENT_AMBULATORY_CARE_PROVIDER_SITE_OTHER): Payer: 59 | Admitting: Internal Medicine

## 2019-09-15 VITALS — BP 124/82 | HR 79 | Ht 64.0 in | Wt 237.0 lb

## 2019-09-15 DIAGNOSIS — J45909 Unspecified asthma, uncomplicated: Secondary | ICD-10-CM

## 2019-09-15 DIAGNOSIS — Z0000 Encounter for general adult medical examination without abnormal findings: Secondary | ICD-10-CM | POA: Diagnosis not present

## 2019-09-15 DIAGNOSIS — E890 Postprocedural hypothyroidism: Secondary | ICD-10-CM

## 2019-09-15 DIAGNOSIS — M459 Ankylosing spondylitis of unspecified sites in spine: Secondary | ICD-10-CM

## 2019-09-15 DIAGNOSIS — R002 Palpitations: Secondary | ICD-10-CM | POA: Diagnosis not present

## 2019-09-15 DIAGNOSIS — Z1231 Encounter for screening mammogram for malignant neoplasm of breast: Secondary | ICD-10-CM | POA: Diagnosis not present

## 2019-09-15 LAB — POCT URINALYSIS DIPSTICK
Bilirubin, UA: NEGATIVE
Blood, UA: NEGATIVE
Glucose, UA: NEGATIVE
Ketones, UA: NEGATIVE
Leukocytes, UA: NEGATIVE
Nitrite, UA: NEGATIVE
Protein, UA: NEGATIVE
Spec Grav, UA: 1.025 (ref 1.010–1.025)
Urobilinogen, UA: 0.2 E.U./dL
pH, UA: 6 (ref 5.0–8.0)

## 2019-09-15 MED ORDER — ALBUTEROL SULFATE HFA 108 (90 BASE) MCG/ACT IN AERS: 2.0000 | INHALATION_SPRAY | Freq: Four times a day (QID) | RESPIRATORY_TRACT | 0 refills | Status: DC | PRN

## 2019-09-15 MED ORDER — FLUTICASONE-SALMETEROL 100-50 MCG/DOSE IN AEPB: 1.0000 | INHALATION_SPRAY | Freq: Two times a day (BID) | RESPIRATORY_TRACT | 3 refills | Status: DC

## 2019-09-15 NOTE — Patient Instructions (Signed)
Start Advair - one inhalation twice a day

## 2019-09-15 NOTE — Progress Notes (Signed)
Date:  09/15/2019   Name:  Hailey [name]   DOB:  1975-12-22   MRN:  [id number]   Chief Complaint: Annual Exam (Breast Exam. No pap.) Hailey [name] is a 44 y.o. female who presents today for her Complete Annual Exam. She feels fairly well. She reports exercising rarely due to back and muscle pain. She reports she is sleeping fairly well.   Mammogram  01/2018 Pap smear  2019 neg with cotesting Immunization History  Administered Date(s) Administered  . Influenza,inj,Quad PF,6+ Mos 06/01/2019    Thyroid Problem Presents for follow-up visit. Patient reports no anxiety, constipation, diarrhea, fatigue, palpitations or tremors. The symptoms have been stable.  Asthma She complains of cough and wheezing. There is no shortness of breath. This is a recurrent problem. The problem occurs every several days. Pertinent negatives include no chest pain, fever, headaches or trouble swallowing. Her past medical history is significant for asthma.  Palpitations  This is a new problem. The current episode started more than 1 month ago. Episode frequency: about once a week since October. The problem has been unchanged. On average, each episode lasts 1 minute. Associated symptoms include coughing. Pertinent negatives include no anxiety, chest pain, dizziness, fever, shortness of breath or vomiting.   AS - under the care of Rheumatology.  Recently started Enbrel with some benefit.  Lab Results  Component Value Date   CREATININE 0.80 09/17/2016   BUN 9 09/17/2016   NA 136 09/17/2016   K 4.3 09/17/2016   CL 105 09/17/2016   CO2 24 09/17/2016   No results found for: CHOL, HDL, LDLCALC, LDLDIRECT, TRIG, CHOLHDL Lab Results  Component Value Date   TSH 2.681 04/17/2019   Lab Results  Component Value Date   HGBA1C 5.5 01/26/2015     Review of Systems  Constitutional: Negative for chills, fatigue and fever.  HENT: Negative for congestion, hearing loss, tinnitus, trouble swallowing and voice change.     Eyes: Negative for visual disturbance.  Respiratory: Positive for cough and wheezing. Negative for chest tightness and shortness of breath.   Cardiovascular: Negative for chest pain, palpitations and leg swelling.  Gastrointestinal: Negative for abdominal pain, constipation, diarrhea and vomiting.  Endocrine: Negative for polydipsia and polyuria.  Genitourinary: Negative for dysuria, frequency, genital sores, vaginal bleeding and vaginal discharge.  Musculoskeletal: Negative for arthralgias, gait problem and joint swelling.  Skin: Negative for color change and rash.  Neurological: Negative for dizziness, tremors, light-headedness and headaches.  Hematological: Negative for adenopathy. Does not bruise/bleed easily.  Psychiatric/Behavioral: Negative for dysphoric mood and sleep disturbance. The patient is not nervous/anxious.     Patient Active Problem List   Diagnosis Date Noted  . Ankylosing spondylitis ([place]) 12/30/2018  . Asthma in adult without complication 99991111  . Chronic diarrhea 12/30/2018  . 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 Interstitial cystitis 07/03/2016  . Pelvic pain in female 07/03/2016  . 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 BMI 38.0-38.9,adult 02/14/2016  . Polyarthralgia 02/14/2016  . GERD (gastroesophageal reflux disease) 02/10/2014    Allergies  Allergen Reactions  . Codeine Other (See Comments)    Hullacinations  . Hydrocodone-Acetaminophen Other (See Comments)    Cannot urinate on medication  . Lodine [Etodolac] Shortness Of Breath  . Quetiapine Other (See Comments)    Excessive sedation at 50 mg.  . Zanaflex [Tizanidine Hcl]   . Ciprofloxacin Hives  . Escitalopram Other (See Comments)    Intolerant.  . Latex Rash  . Levaquin [Levofloxacin In  D5w] Rash    HIVES    Past Surgical History:  Procedure Laterality Date  . BREAST BIOPSY Right 10/18/2015   bx/clip- neg  . CHOLECYSTECTOMY N/A 07/07/2016   Procedure: LAPAROSCOPIC  CHOLECYSTECTOMY;  Surgeon: Olean Ree, MD;  Location: [facility] ORS;  Service: General;  Laterality: N/A;  . COLONOSCOPY WITH PROPOFOL N/A 10/11/2015   Procedure: COLONOSCOPY WITH PROPOFOL;  Surgeon: Lollie Sails, MD;  Location: [facility] ENDOSCOPY;  Service: Endoscopy;  Laterality: N/A;  . ESOPHAGOGASTRODUODENOSCOPY (EGD) WITH PROPOFOL N/A 10/11/2015   Procedure: ESOPHAGOGASTRODUODENOSCOPY (EGD) WITH PROPOFOL;  Surgeon: Lollie Sails, MD;  Location: [facility] ENDOSCOPY;  Service: Endoscopy;  Laterality: N/A;  . TONSILLECTOMY    . TOTAL THYROIDECTOMY  2005   goiter    Social History   Tobacco Use  . Smoking status: Former Smoker    Packs/day: 0.50    Years: 7.00    Pack years: 3.50    Types: Cigarettes    Quit date: 2003    Years since quitting: 18.0  . Smokeless tobacco: Never Used  Substance Use Topics  . Alcohol use: Yes    Comment: Occ.  . Drug use: No     Medication list has been reviewed and updated.  Current Meds  Medication Sig  . albuterol (VENTOLIN HFA) 108 (90 Base) MCG/ACT inhaler Inhale 2 puffs into the lungs every 6 (six) hours as needed for wheezing or shortness of breath.  Marland Kitchen amitriptyline (ELAVIL) 10 MG tablet Take 10 mg by mouth at bedtime as needed (Bladder spasms).   . clobetasol (TEMOVATE) 0.05 % GEL Apply twice daily  . etanercept (ENBREL SURECLICK) 50 MG/ML injection Inject 1 ml under the skin weekly.  . hydrocortisone 2.5 % cream   . ibuprofen (ADVIL) 200 MG tablet Take 200 mg by mouth every 6 (six) hours as needed.  Marland Kitchen liothyronine (CYTOMEL) 5 MCG tablet TAKE 2 TABLETS BEFORE BREAKFAST AND 1 TABLET BEFORE LUNCH.  . montelukast (SINGULAIR) 10 MG tablet Take 1 tablet (10 mg total) by mouth at bedtime.  . naproxen (NAPROSYN) 500 MG tablet   . SYNTHROID 175 MCG tablet TAKE 1 TABLET BY MOUTH DAILY BEFORE BREAKFAST.  . [DISCONTINUED] albuterol (VENTOLIN HFA) 108 (90 Base) MCG/ACT inhaler Inhale 2 puffs into the lungs every 6 (six) hours as needed for wheezing or  shortness of breath.    PHQ 2/9 Scores 09/15/2019 05/09/2019 03/13/2019 12/30/2018  PHQ - 2 Score 0 0 0 0  PHQ- 9 Score - 12 0 -    BP Readings from Last 3 Encounters:  09/15/19 124/82  05/09/19 118/74  04/11/19 113/77    Physical Exam Vitals and nursing note reviewed.  Constitutional:      General: She is not in acute distress.    Appearance: She is well-developed.  HENT:     Head: Normocephalic and atraumatic.     Right Ear: Tympanic membrane and ear canal normal.     Left Ear: Tympanic membrane and ear canal normal.     Nose:     Right Sinus: No maxillary sinus tenderness.     Left Sinus: No maxillary sinus tenderness.  Eyes:     General: No scleral icterus.       Right eye: No discharge.        Left eye: No discharge.     Conjunctiva/sclera: Conjunctivae normal.  Neck:     Thyroid: No thyromegaly.     Vascular: No carotid bruit.  Cardiovascular:     Rate and Rhythm: Normal  rate and regular rhythm.     Pulses: Normal pulses.     Heart sounds: Normal heart sounds.  Pulmonary:     Effort: Pulmonary effort is normal. No respiratory distress.     Breath sounds: No wheezing.  Chest:     Breasts:        Right: No mass, nipple discharge, skin change or tenderness.        Left: No mass, nipple discharge, skin change or tenderness.  Abdominal:     General: Bowel sounds are normal.     Palpations: Abdomen is soft.     Tenderness: There is no abdominal tenderness.  Musculoskeletal:     Cervical back: Normal range of motion. No erythema.     Right lower leg: No edema.     Left lower leg: No edema.     Comments: Demonstrates MSK pain with movement  Lymphadenopathy:     Cervical: No cervical adenopathy.  Skin:    General: Skin is warm and dry.     Capillary Refill: Capillary refill takes less than 2 seconds.     Findings: No rash.  Neurological:     Mental Status: She is alert and oriented to person, place, and time.     Cranial Nerves: No cranial nerve deficit.      Sensory: No sensory deficit.     Deep Tendon Reflexes: Reflexes are normal and symmetric.  Psychiatric:        Speech: Speech normal.        Behavior: Behavior normal.        Thought Content: Thought content normal.     Wt Readings from Last 3 Encounters:  09/15/19 237 lb (107.5 kg)  05/09/19 234 lb (106.1 kg)  04/11/19 234 lb (106.1 kg)    BP 124/82   Pulse 79   Ht 5\' 4"  (1.626 m)   Wt 237 lb (107.5 kg)   SpO2 96%   BMI 40.68 kg/m   Assessment and Plan: 1. Annual physical exam Normal exam except for weight Work on low calorie diet; unable to perform aerobic exercise - CBC with Differential/Platelet - Comprehensive metabolic panel - Hemoglobin A1c - Lipid panel - POCT urinalysis dipstick  2. Encounter for screening mammogram for breast cancer To be scheduled at [place]; Future  3. Post-surgical hypothyroidism Supplemented with no symptoms to suggest need for dose change. Will check labs and advise - TSH + free T4  4. Asthma in adult without complication, unspecified asthma severity, unspecified whether persistent She is having mild persistent symptoms and is not on a daily controlled medication Will start Advair daily; continue albuterol PRN and singulair - albuterol (VENTOLIN HFA) 108 (90 Base) MCG/ACT inhaler; Inhale 2 puffs into the lungs every 6 (six) hours as needed for wheezing or shortness of breath.  Dispense: 54 g; Refill: 0 - Fluticasone-Salmeterol (ADVAIR) 100-50 MCG/DOSE AEPB; Inhale 1 puff into the lungs 2 (two) times daily.  Dispense: 3 each; Refill: 3  5. Ankylosing spondylitis, unspecified site of spine ([place]) Now on Enbrel and getting some relief; still having significant pain that is very limiting She was told by Rheumatology that if this does not help, then there is nothing else they can do for her.  6. Palpitations Intermittent episodes lasting only 1-2 minutes without pre-syncope, chest pain or SOB EKG is normal at  baseline Will place a Zio patch monitor - EKG 12-Lead - NSR @ 67, WNL   Partially dictated using Dragon  software. Any errors are unintentional.  Halina Maidens, MD [facility]  09/15/2019

## 2019-09-16 LAB — COMPREHENSIVE METABOLIC PANEL
ALT: 33 IU/L — ABNORMAL HIGH (ref 0–32)
AST: 29 IU/L (ref 0–40)
Albumin/Globulin Ratio: 1.8 (ref 1.2–2.2)
Albumin: 4.4 g/dL (ref 3.8–4.8)
Alkaline Phosphatase: 125 IU/L — ABNORMAL HIGH (ref 39–117)
BUN/Creatinine Ratio: 12 (ref 9–23)
BUN: 9 mg/dL (ref 6–24)
Bilirubin Total: 0.3 mg/dL (ref 0.0–1.2)
CO2: 22 mmol/L (ref 20–29)
Calcium: 9.9 mg/dL (ref 8.7–10.2)
Chloride: 103 mmol/L (ref 96–106)
Creatinine, Ser: 0.73 mg/dL (ref 0.57–1.00)
GFR calc Af Amer: 117 mL/min/{1.73_m2} (ref >59)
GFR calc non Af Amer: 101 mL/min/{1.73_m2} (ref >59)
Globulin, Total: 2.5 g/dL (ref 1.5–4.5)
Glucose: 107 mg/dL — ABNORMAL HIGH (ref 65–99)
Potassium: 4.6 mmol/L (ref 3.5–5.2)
Sodium: 136 mmol/L (ref 134–144)
Total Protein: 6.9 g/dL (ref 6.0–8.5)

## 2019-09-16 LAB — LIPID PANEL
Chol/HDL Ratio: 2.6 ratio (ref 0.0–4.4)
Cholesterol, Total: 181 mg/dL (ref 100–199)
HDL: 70 mg/dL (ref >39)
LDL Chol Calc (NIH): 91 mg/dL (ref 0–99)
Triglycerides: 116 mg/dL (ref 0–149)
VLDL Cholesterol Cal: 20 mg/dL (ref 5–40)

## 2019-09-16 LAB — CBC WITH DIFFERENTIAL/PLATELET
Basophils Absolute: 0.1 10*3/uL (ref 0.0–0.2)
Basos: 1 %
EOS (ABSOLUTE): 0.4 10*3/uL (ref 0.0–0.4)
Eos: 4 %
Hematocrit: 40.1 % (ref 34.0–46.6)
Hemoglobin: 13.1 g/dL (ref 11.1–15.9)
Immature Grans (Abs): 0 10*3/uL (ref 0.0–0.1)
Immature Granulocytes: 0 %
Lymphocytes Absolute: 3.2 10*3/uL — ABNORMAL HIGH (ref 0.7–3.1)
Lymphs: 37 %
MCH: 29 pg (ref 26.6–33.0)
MCHC: 32.7 g/dL (ref 31.5–35.7)
MCV: 89 fL (ref 79–97)
Monocytes Absolute: 0.8 10*3/uL (ref 0.1–0.9)
Monocytes: 9 %
Neutrophils Absolute: 4.2 10*3/uL (ref 1.4–7.0)
Neutrophils: 49 %
Platelets: 343 10*3/uL (ref 150–450)
RBC: 4.51 x10E6/uL (ref 3.77–5.28)
RDW: 13.8 % (ref 11.7–15.4)
WBC: 8.6 10*3/uL (ref 3.4–10.8)

## 2019-09-16 LAB — TSH+FREE T4
Free T4: 1.05 ng/dL (ref 0.82–1.77)
TSH: 4.34 u[IU]/mL (ref 0.450–4.500)

## 2019-09-16 LAB — HEMOGLOBIN A1C
Est. average glucose Bld gHb Est-mCnc: 120 mg/dL
Hgb A1c MFr Bld: 5.8 % — ABNORMAL HIGH (ref 4.8–5.6)

## 2019-09-16 MED FILL — ENBREL SURECLICK 50 MG/ML S: 50 | 28 days supply | Qty: 4 | Fill #1

## 2019-09-19 ENCOUNTER — Encounter: Payer: Self-pay | Admitting: Internal Medicine

## 2019-09-21 ENCOUNTER — Other Ambulatory Visit: Payer: Self-pay | Admitting: Internal Medicine

## 2019-09-21 DIAGNOSIS — M459 Ankylosing spondylitis of unspecified sites in spine: Secondary | ICD-10-CM

## 2019-10-06 DIAGNOSIS — M25562 Pain in left knee: Secondary | ICD-10-CM | POA: Diagnosis not present

## 2019-10-06 DIAGNOSIS — Z6841 Body Mass Index (BMI) 40.0 and over, adult: Secondary | ICD-10-CM | POA: Diagnosis not present

## 2019-10-06 DIAGNOSIS — M545 Low back pain: Secondary | ICD-10-CM | POA: Diagnosis not present

## 2019-10-06 DIAGNOSIS — M2550 Pain in unspecified joint: Secondary | ICD-10-CM | POA: Diagnosis not present

## 2019-10-06 DIAGNOSIS — M450 Ankylosing spondylitis of multiple sites in spine: Secondary | ICD-10-CM | POA: Diagnosis not present

## 2019-10-06 DIAGNOSIS — R899 Unspecified abnormal finding in specimens from other organs, systems and tissues: Secondary | ICD-10-CM | POA: Diagnosis not present

## 2019-10-06 DIAGNOSIS — R5382 Chronic fatigue, unspecified: Secondary | ICD-10-CM | POA: Diagnosis not present

## 2019-10-09 DIAGNOSIS — R002 Palpitations: Secondary | ICD-10-CM | POA: Diagnosis not present

## 2019-10-14 ENCOUNTER — Other Ambulatory Visit: Payer: Self-pay | Admitting: Endocrinology

## 2019-10-16 MED FILL — SYNTHROID 175 MCG TABLET: 175 | 90 days supply | Qty: 90 | Fill #0

## 2019-10-16 MED FILL — ENBREL SURECLICK 50 MG/ML S: 50 | 28 days supply | Qty: 4 | Fill #2

## 2019-10-16 MED FILL — LIOTHYRONINE SODIUM 5 MCG T: 5 | 30 days supply | Qty: 90 | Fill #1

## 2019-10-16 MED FILL — AMITRIPTYLINE HCL 25 MG TAB: 25 | 30 days supply | Qty: 30 | Fill #0

## 2019-10-22 ENCOUNTER — Ambulatory Visit: Payer: 59 | Admitting: Endocrinology

## 2019-10-23 ENCOUNTER — Other Ambulatory Visit
Admission: RE | Admit: 2019-10-23 | Discharge: 2019-10-23 | Disposition: A | Discharge status: Home / Self Care | Payer: 59 | Attending: Endocrinology | Admitting: Endocrinology

## 2019-10-23 ENCOUNTER — Other Ambulatory Visit: Payer: Self-pay

## 2019-10-23 DIAGNOSIS — E063 Autoimmune thyroiditis: Secondary | ICD-10-CM | POA: Diagnosis not present

## 2019-10-23 LAB — TSH: TSH: 2.196 u[IU]/mL (ref 0.350–4.500)

## 2019-10-23 LAB — T4, FREE: Free T4: 0.85 ng/dL (ref 0.61–1.12)

## 2019-10-23 NOTE — Addendum Note (Signed)
Addended by: Milbert Coulter on: 10/23/2019 04:32 PM   Modules accepted: Orders

## 2019-10-23 NOTE — Addendum Note (Signed)
Addended by: Milbert Coulter on: 10/23/2019 04:33 PM   Modules accepted: Orders

## 2019-10-23 NOTE — Progress Notes (Signed)
Patient ID: Hailey [name], female   DOB: 10-01-1975, 44 y.o.   MRN: [id number]             Referring Physician: Sabra Heck  Today's office visit was provided via telemedicine using video technique The patient was explained the limitations of evaluation and management by telemedicine and the availability of in person appointments.  The patient understood the limitations and agreed to proceed. Patient also understood that the telehealth visit is billable. . Location of the patient: Patient's home . Location of the provider: Physician office Only the patient and myself were participating in the encounter    Reason for Appointment:  Hypothyroidism, follow-up visit    History of Present Illness:    Hypothyroidism was first diagnosed in 2006 after thyroidectomy. After her thyroid surgery she was put on levothyroxine, unknown doses Subsequently she went to an endocrinologist who felt that she will do better with a combination of Cytomel and levothyroxine since she was having weight gain The patient does not think she felt any better or had any significant weight loss with this regimen but this was continued until 04/2016 She had gone to another physician for a second opinion and she was switched to Synthroid from her previous regimen of 88 g levothyroxine and 25 g of Cytomel because of her free T3 level being high at 5.8  On her initial consultation she was complaining about weight gain, joint pains and fatigue Since her TSH was 14.7 her dose was increased from the previous 150 g up to 200 g  Recent history: Since she was having significant fatigue with 200 g Synthroid alone she has been on a regimen of Cytomel daily along with Synthroid 150 g daily since 2018  She has done better subjectively with this compared to Synthroid alone She was asked to take an extra 5 g of Cytomel at lunchtime in addition when she was seen in July 2018 since she had upper normal TSH and her symptoms were  persistent Also had complaints of fatigue and afternoon somnolence However she tends to forget this extra dose  She does take her morning levothyroxine and liothyronine daily consistently  Synthroid dose has been increased to 175 as of 5/20  Does not feel unusually fatigued or have any cold intolerance Her only complaint is continued hair loss without obvious bald patches and is due to see dermatologist  Labs show her TSH is last month high normal at 4.3 and T3 is relatively lower However repeat labs yesterday show TSH to be back to 2.2 and free T3 3.6   Patient's weight history is as follows:  Wt Readings from Last 3 Encounters:  09/15/19 237 lb (107.5 kg)  05/09/19 234 lb (106.1 kg)  04/11/19 234 lb (106.1 kg)    Thyroid function results have been as follows:  Lab Results  Component Value Date   FREET4 0.85 10/23/2019   FREET4 1.05 09/15/2019   FREET4 0.95 04/17/2019   TSH 2.196 10/23/2019   TSH 4.340 09/15/2019   TSH 2.681 04/17/2019   Lab Results  Component Value Date   T3FREE 3.6 10/23/2019   T3FREE 3.0 04/17/2019   T3FREE 3.3 02/05/2019   T3FREE 3.8 08/01/2018    The following is a copy of her surgical assessment prior to her thyroid surgery:  Patient has a history of hyperthyroidism who had a dominant right thyroid nodule identified by her endocrinologist. This nodule is photopenic and ultrasound guided biopsy has demonstrated a follicular lesion with scant colloid. 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 Her endocrinologist has recommended subtotal thyroidectomy to take care of both the nodule in the right thyroid lobe as well as the hyperfunctioning left thyroid tissue  Apparently she had Hashimoto's thyroiditis in the left lobe on pathology  Past Medical History:  Diagnosis Date  . Allergic state   . Allergy   . Arthritis   . Asthma   . Cervical disc  disease   . Depression   . GERD (gastroesophageal reflux disease)   . H/O colonoscopy   . HPV (human papilloma virus) infection   . Hypothyroidism   . Interstitial cystitis   . LGSIL (low grade squamous intraepithelial dysplasia)   . Paresthesia   . Status post laparoscopic cholecystectomy 07/13/2016    Past Surgical History:  Procedure Laterality Date  . BREAST BIOPSY Right 10/18/2015   bx/clip- neg  . CHOLECYSTECTOMY N/A 07/07/2016   Procedure: LAPAROSCOPIC CHOLECYSTECTOMY;  Surgeon: Olean Ree, MD;  Location: [facility] ORS;  Service: General;  Laterality: N/A;  . COLONOSCOPY WITH PROPOFOL N/A 10/11/2015   Procedure: COLONOSCOPY WITH PROPOFOL;  Surgeon: Lollie Sails, MD;  Location: [facility] ENDOSCOPY;  Service: Endoscopy;  Laterality: N/A;  . ESOPHAGOGASTRODUODENOSCOPY (EGD) WITH PROPOFOL N/A 10/11/2015   Procedure: ESOPHAGOGASTRODUODENOSCOPY (EGD) WITH PROPOFOL;  Surgeon: Lollie Sails, MD;  Location: [facility] ENDOSCOPY;  Service: Endoscopy;  Laterality: N/A;  . TONSILLECTOMY    . TOTAL THYROIDECTOMY  2005   goiter    Family History  Problem Relation Age of Onset  . Hypertension Mother   . Stroke Mother   . Cancer Mother   . Goiter Mother   . Diabetes Father   . Hypertension Father   . Hypertension Brother   . Hypertension Maternal Grandmother   . Stroke Maternal Grandmother   . Diabetes Maternal Grandmother   . Breast cancer Cousin 38       pat cousin    Social History:  reports that she quit smoking about 18 years ago. Her smoking use included cigarettes. She has a 3.50 pack-year smoking history. She has never used smokeless tobacco. She reports current alcohol use. She reports that she does not use drugs.  Allergies:  Allergies  Allergen Reactions  . Codeine Other (See Comments)    Hullacinations  . Hydrocodone-Acetaminophen Other (See Comments)    Cannot urinate on medication  . Lodine [Etodolac] Shortness Of Breath  . Quetiapine Other (See Comments)     Excessive sedation at 50 mg.  . Zanaflex [Tizanidine Hcl]   . Ciprofloxacin Hives  . Escitalopram Other (See Comments)    Intolerant.  . Latex Rash  . Levaquin [Levofloxacin In D5w] Rash    HIVES    Allergies as of 10/24/2019      Reactions   Codeine Other (See Comments)   Hullacinations   Hydrocodone-acetaminophen Other (See Comments)   Cannot urinate on medication   Lodine [etodolac] Shortness Of Breath   Quetiapine Other (See Comments)   Excessive sedation at 50 mg.   Zanaflex [tizanidine Hcl]    Ciprofloxacin Hives   Escitalopram Other (See Comments)   Intolerant.   Latex Rash   Levaquin [levofloxacin In D5w] Rash   HIVES      Medication List       Accurate as of October 24, 2019  3:43 PM. If you have any questions, ask your nurse or doctor.        albuterol 108 (90 Base)  MCG/ACT inhaler Commonly known as: VENTOLIN HFA Inhale 2 puffs into the lungs every 6 (six) hours as needed for wheezing or shortness of breath.   amitriptyline 10 MG tablet Commonly known as: ELAVIL Take 10 mg by mouth at bedtime as needed (Bladder spasms).   clobetasol 0.05 % Gel Commonly known as: TEMOVATE Apply twice daily   Enbrel SureClick 50 MG/ML injection Generic drug: etanercept Inject 1 ml under the skin weekly.   Fluticasone-Salmeterol 100-50 MCG/DOSE Aepb Commonly known as: ADVAIR Inhale 1 puff into the lungs 2 (two) times daily.   hydrocortisone 2.5 % cream   ibuprofen 200 MG tablet Commonly known as: ADVIL Take 200 mg by mouth every 6 (six) hours as needed.   liothyronine 5 MCG tablet Commonly known as: CYTOMEL TAKE 2 TABLETS BEFORE BREAKFAST AND 1 TABLET BEFORE LUNCH.   montelukast 10 MG tablet Commonly known as: SINGULAIR Take 1 tablet (10 mg total) by mouth at bedtime.   naproxen 500 MG tablet Commonly known as: NAPROSYN   Synthroid 175 MCG tablet Generic drug: levothyroxine TAKE 1 TABLET BY MOUTH DAILY BEFORE BREAKFAST.          Review of  Systems       She is on amitriptyline 10 mg for overactive bladder and interstitial cystitis,  She has history of depression in the past and does not like to take medications, she thinks Lexapro caused bladder symptoms and higher doses  She has knee pains and may have ankylosing spondylitis, followed by rheumatologist         Examination:    There were no vitals taken for this visit.     Assessment:  HYPOTHYROIDISM, Postsurgical and also history of Hashimoto's thyroiditis  She has been on levothyroxine 175 mcg and liothyronine 10 mcg daily  Recently she feels fairly good recently  TSH is back to normal at 2.2 this month Free T3 which was is relatively low at 3.0 last month is improved and free T4 is stable  Her only symptom is now hair loss which appears to be unrelated  She has been compliant with her levothyroxine and liothyronine every morning   PLAN:   She will continue to take only 10 mcg of Cytomel consistently together with her Synthroid before breakfast No change in brand name Synthroid 175 dose She will call if she has any increasing fatigue  She will follow-up in 6 months  Kenishia Plack 10/24/2019, 3:43 PM     Note: This office note was prepared with Dragon voice recognition system technology. Any transcriptional errors that result from this process are unintentional.   Elayne Snare

## 2019-10-24 ENCOUNTER — Ambulatory Visit (INDEPENDENT_AMBULATORY_CARE_PROVIDER_SITE_OTHER): Payer: 59 | Admitting: Endocrinology

## 2019-10-24 ENCOUNTER — Encounter: Payer: Self-pay | Admitting: Endocrinology

## 2019-10-24 ENCOUNTER — Other Ambulatory Visit: Payer: Self-pay

## 2019-10-24 DIAGNOSIS — E890 Postprocedural hypothyroidism: Secondary | ICD-10-CM

## 2019-10-24 LAB — T3, FREE: T3, Free: 3.6 pg/mL (ref 2.0–4.4)

## 2019-10-29 ENCOUNTER — Telehealth: Payer: Self-pay | Admitting: Internal Medicine

## 2019-10-29 NOTE — Telephone Encounter (Signed)
Patient informed through my chart about her Zio monitor.

## 2019-10-29 NOTE — Telephone Encounter (Signed)
Please let her know that the heart monitor results are normal.  There were no abnormal or worrisome rhythms. She marked 2 events, both of which were sinus rhythm at a normal rate.

## 2019-11-06 DIAGNOSIS — L650 Telogen effluvium: Secondary | ICD-10-CM | POA: Diagnosis not present

## 2019-11-06 DIAGNOSIS — Z7689 Persons encountering health services in other specified circumstances: Secondary | ICD-10-CM | POA: Diagnosis not present

## 2019-11-12 MED FILL — ENBREL SURECLICK 50 MG/ML S: 50 | 28 days supply | Qty: 4 | Fill #3

## 2019-11-29 DIAGNOSIS — R05 Cough: Secondary | ICD-10-CM | POA: Diagnosis not present

## 2019-11-29 DIAGNOSIS — B349 Viral infection, unspecified: Secondary | ICD-10-CM | POA: Diagnosis not present

## 2019-12-05 DIAGNOSIS — M545 Low back pain: Secondary | ICD-10-CM | POA: Diagnosis not present

## 2019-12-05 DIAGNOSIS — M2550 Pain in unspecified joint: Secondary | ICD-10-CM | POA: Diagnosis not present

## 2019-12-05 DIAGNOSIS — M25562 Pain in left knee: Secondary | ICD-10-CM | POA: Diagnosis not present

## 2019-12-05 DIAGNOSIS — R899 Unspecified abnormal finding in specimens from other organs, systems and tissues: Secondary | ICD-10-CM | POA: Diagnosis not present

## 2019-12-05 DIAGNOSIS — R5382 Chronic fatigue, unspecified: Secondary | ICD-10-CM | POA: Diagnosis not present

## 2019-12-05 DIAGNOSIS — M450 Ankylosing spondylitis of multiple sites in spine: Secondary | ICD-10-CM | POA: Diagnosis not present

## 2019-12-05 DIAGNOSIS — Z6841 Body Mass Index (BMI) 40.0 and over, adult: Secondary | ICD-10-CM | POA: Diagnosis not present

## 2019-12-05 LAB — HEPATIC FUNCTION PANEL
ALT: 22 (ref 7–35)
AST: 21 (ref 13–35)
Alkaline Phosphatase: 137 — AB (ref 25–125)
Bilirubin, Total: 0.5

## 2019-12-05 LAB — COMPREHENSIVE METABOLIC PANEL
Albumin: 4.4 (ref 3.5–5.0)
Calcium: 9.8 (ref 8.7–10.7)
Globulin: 2.9

## 2019-12-05 LAB — CBC AND DIFFERENTIAL
HCT: 42 (ref 36–46)
Hemoglobin: 13.3 (ref 12.0–16.0)
Neutrophils Absolute: 3
Platelets: 420 — AB (ref 150–399)
WBC: 7.4

## 2019-12-05 LAB — BASIC METABOLIC PANEL
BUN: 9 (ref 4–21)
CO2: 20 (ref 13–22)
Chloride: 104 (ref 99–108)
Creatinine: 0.7 (ref 0.5–1.1)
Glucose: 94
Potassium: 4.7 (ref 3.4–5.3)
Sodium: 137 (ref 137–147)

## 2019-12-05 LAB — CBC: RBC: 4.66 (ref 3.87–5.11)

## 2019-12-08 ENCOUNTER — Ambulatory Visit: Payer: 59 | Admitting: Internal Medicine

## 2019-12-08 ENCOUNTER — Other Ambulatory Visit: Payer: Self-pay

## 2019-12-08 ENCOUNTER — Encounter: Payer: Self-pay | Admitting: Internal Medicine

## 2019-12-08 VITALS — BP 124/70 | HR 70 | Temp 97.7°F | Ht 64.0 in | Wt 234.0 lb

## 2019-12-08 DIAGNOSIS — N898 Other specified noninflammatory disorders of vagina: Secondary | ICD-10-CM | POA: Diagnosis not present

## 2019-12-08 DIAGNOSIS — R102 Pelvic and perineal pain: Secondary | ICD-10-CM | POA: Diagnosis not present

## 2019-12-08 DIAGNOSIS — M459 Ankylosing spondylitis of unspecified sites in spine: Secondary | ICD-10-CM

## 2019-12-08 MED ORDER — NABUMETONE 750 MG PO TABS: 750.0000 mg | ORAL_TABLET | Freq: Every day | ORAL | 0 refills | Status: DC

## 2019-12-08 NOTE — Progress Notes (Signed)
Date:  12/08/2019   Name:  Hailey [name]   DOB:  Jun 09, 1976   MRN:  [id number]   Chief Complaint: Leg Pain (Both legs. Seen Rhuematoid doctor Friday. Embryl injection once a week feels like its helping the pain but not taking the pain completely away. Saturday started getting worse. Rain makes her have a flare worse. Last night she said her arthritis was so bad it felt like "child birth." Tried aleve, tylenol, and advil. Bad flare up.  Will be seeing new rhuematoid doctor at [facility] in May.)  Leg Pain  There was no injury mechanism. The pain is present in the left thigh and right thigh. The quality of the pain is described as shooting and stabbing. The pain is at a severity of 7/10. The pain is severe. The pain has been fluctuating since onset. She reports no foreign bodies present. She has tried NSAIDs (she thinks it may be due to AS - was at the end of her Enbrel dose yesterday) for the symptoms. The treatment provided no relief.    Lab Results  Component Value Date   CREATININE 0.73 09/15/2019   BUN 9 09/15/2019   NA 136 09/15/2019   K 4.6 09/15/2019   CL 103 09/15/2019   CO2 22 09/15/2019   Lab Results  Component Value Date   CHOL 181 09/15/2019   HDL 70 09/15/2019   LDLCALC 91 09/15/2019   TRIG 116 09/15/2019   CHOLHDL 2.6 09/15/2019   Lab Results  Component Value Date   TSH 2.196 10/23/2019   Lab Results  Component Value Date   HGBA1C 5.8 (H) 09/15/2019   Lab Results  Component Value Date   WBC 8.6 09/15/2019   HGB 13.1 09/15/2019   HCT 40.1 09/15/2019   MCV 89 09/15/2019   PLT 343 09/15/2019   Lab Results  Component Value Date   ALT 33 (H) 09/15/2019   AST 29 09/15/2019   ALKPHOS 125 (H) 09/15/2019   BILITOT 0.3 09/15/2019     Review of Systems  Constitutional: Positive for fatigue. Negative for chills and fever.  Musculoskeletal: Positive for arthralgias, back pain, joint swelling and myalgias.  Psychiatric/Behavioral: Positive for sleep disturbance.     Patient Active Problem List   Diagnosis Date Noted  . Ankylosing spondylitis ([place]) 12/30/2018  . Asthma in adult without complication 99991111  . Chronic diarrhea 12/30/2018  . 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 Interstitial cystitis 07/03/2016  . Pelvic pain in female 07/03/2016  . 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 BMI 38.0-38.9,adult 02/14/2016  . Polyarthralgia 02/14/2016  . GERD (gastroesophageal reflux disease) 02/10/2014    Allergies  Allergen Reactions  . Codeine Other (See Comments)    Hullacinations  . Hydrocodone-Acetaminophen Other (See Comments)    Cannot urinate on medication  . Lodine [Etodolac] Shortness Of Breath  . Quetiapine Other (See Comments)    Excessive sedation at 50 mg.  . Zanaflex [Tizanidine Hcl]   . Ciprofloxacin Hives  . Escitalopram Other (See Comments)    Intolerant.  . Latex Rash  . Levaquin [Levofloxacin In D5w] Rash    HIVES    Past Surgical History:  Procedure Laterality Date  . BREAST BIOPSY Right 10/18/2015   bx/clip- neg  . CHOLECYSTECTOMY N/A 07/07/2016   Procedure: LAPAROSCOPIC CHOLECYSTECTOMY;  Surgeon: Olean Ree, MD;  Location: [facility] ORS;  Service: General;  Laterality: N/A;  . COLONOSCOPY WITH PROPOFOL N/A 10/11/2015   Procedure: COLONOSCOPY WITH PROPOFOL;  Surgeon: Lollie Sails, MD;  Location: [facility] ENDOSCOPY;  Service: Endoscopy;  Laterality: N/A;  . ESOPHAGOGASTRODUODENOSCOPY (EGD) WITH PROPOFOL N/A 10/11/2015   Procedure: ESOPHAGOGASTRODUODENOSCOPY (EGD) WITH PROPOFOL;  Surgeon: Lollie Sails, MD;  Location: [facility] ENDOSCOPY;  Service: Endoscopy;  Laterality: N/A;  . TONSILLECTOMY    . TOTAL THYROIDECTOMY  2005   goiter    Social History   Tobacco Use  . Smoking status: Former Smoker    Packs/day: 0.50    Years: 7.00    Pack years: 3.50    Types: Cigarettes    Quit date: 2003    Years since quitting: 18.2  . Smokeless tobacco: Never Used  Substance Use Topics  . Alcohol use: Yes     Comment: Occ.  . Drug use: No     Medication list has been reviewed and updated.  Current Meds  Medication Sig  . albuterol (VENTOLIN HFA) 108 (90 Base) MCG/ACT inhaler Inhale 2 puffs into the lungs every 6 (six) hours as needed for wheezing or shortness of breath.  Marland Kitchen amitriptyline (ELAVIL) 10 MG tablet Take 10 mg by mouth at bedtime as needed (Bladder spasms).   . Biotin 1 MG CAPS Take by mouth.  . cholecalciferol (VITAMIN D3) 25 MCG (1000 UNIT) tablet Take 1,000 Units by mouth daily.  . clobetasol (TEMOVATE) 0.05 % GEL Apply twice daily  . etanercept (ENBREL SURECLICK) 50 MG/ML injection Inject 1 ml under the skin weekly.  . Fluticasone-Salmeterol (ADVAIR) 100-50 MCG/DOSE AEPB Inhale 1 puff into the lungs 2 (two) times daily.  . hydrocortisone 2.5 % cream   . ibuprofen (ADVIL) 200 MG tablet Take 200 mg by mouth every 6 (six) hours as needed.  Marland Kitchen liothyronine (CYTOMEL) 5 MCG tablet TAKE 2 TABLETS BEFORE BREAKFAST AND 1 TABLET BEFORE LUNCH.  . minoxidil (ROGAINE) 2 % external solution Apply topically 2 (two) times daily.  . montelukast (SINGULAIR) 10 MG tablet Take 1 tablet (10 mg total) by mouth at bedtime.  . naproxen (NAPROSYN) 500 MG tablet   . SYNTHROID 175 MCG tablet TAKE 1 TABLET BY MOUTH DAILY BEFORE BREAKFAST.    PHQ 2/9 Scores 12/08/2019 09/15/2019 05/09/2019 03/13/2019  PHQ - 2 Score 0 0 0 0  PHQ- 9 Score 0 - 12 0    BP Readings from Last 3 Encounters:  12/08/19 124/70  09/15/19 124/82  05/09/19 118/74    Physical Exam Vitals and nursing note reviewed.  Constitutional:      General: She is not in acute distress.    Appearance: She is well-developed.  HENT:     Head: Normocephalic and atraumatic.  Cardiovascular:     Rate and Rhythm: Normal rate and regular rhythm.     Heart sounds: No murmur.  Pulmonary:     Effort: Pulmonary effort is normal. No respiratory distress.     Breath sounds: No wheezing or rhonchi.  Musculoskeletal:     Cervical back: Normal range of  motion.     Right upper leg: No swelling or tenderness.     Left upper leg: No swelling or tenderness.     Right knee: No swelling or bony tenderness. Decreased range of motion.     Left knee: Swelling and bony tenderness present. Decreased range of motion.     Right lower leg: Normal.     Left lower leg: Normal.  Lymphadenopathy:     Cervical: No cervical adenopathy.  Skin:    General: Skin is warm and dry.     Findings: No rash.  Neurological:  Mental Status: She is alert and oriented to person, place, and time.  Psychiatric:        Behavior: Behavior normal.        Thought Content: Thought content normal.     Wt Readings from Last 3 Encounters:  12/08/19 234 lb (106.1 kg)  09/15/19 237 lb (107.5 kg)  05/09/19 234 lb (106.1 kg)    BP 124/70   Pulse 70   Temp 97.7 F (36.5 C) (Temporal)   Ht 5\' 4"  (1.626 m)   Wt 234 lb (106.1 kg)   LMP 11/26/2019 (Exact Date)   SpO2 100%   BMI 40.17 kg/m   Assessment and Plan: 1. Ankylosing spondylitis, unspecified site of spine ([place]) Having severe leg pains with lack of sleep last night; etiology of the leg pain is unclear Took enbrel today - may have been more pain due to the end of the dosing interval No relief with tylenol or Aleve so will try a different NSAID Has appt with new Rheumatologist in one month - nabumetone (RELAFEN) 750 MG tablet; Take 1 tablet (750 mg total) by mouth daily.  Dispense: 30 tablet; Refill: 0   Partially dictated using Editor, commissioning. Any errors are unintentional.  Halina Maidens, MD [facility]  12/08/2019

## 2019-12-09 ENCOUNTER — Encounter: Payer: Self-pay | Admitting: Internal Medicine

## 2019-12-10 ENCOUNTER — Other Ambulatory Visit: Payer: Self-pay | Admitting: Pharmacist

## 2019-12-10 MED ORDER — ENBREL SURECLICK 50 MG/ML ~~LOC~~ SOAJ: SUBCUTANEOUS | 3 refills | Status: DC

## 2019-12-12 MED FILL — ENBREL SURECLICK 50 MG/ML S: 50 | 28 days supply | Qty: 4 | Fill #0

## 2019-12-19 DIAGNOSIS — N949 Unspecified condition associated with female genital organs and menstrual cycle: Secondary | ICD-10-CM | POA: Diagnosis not present

## 2019-12-19 DIAGNOSIS — N83201 Unspecified ovarian cyst, right side: Secondary | ICD-10-CM | POA: Diagnosis not present

## 2019-12-19 DIAGNOSIS — R102 Pelvic and perineal pain: Secondary | ICD-10-CM | POA: Diagnosis not present

## 2019-12-19 DIAGNOSIS — N83202 Unspecified ovarian cyst, left side: Secondary | ICD-10-CM | POA: Diagnosis not present

## 2020-01-08 ENCOUNTER — Ambulatory Visit: Payer: 59 | Admitting: Dermatology

## 2020-01-12 ENCOUNTER — Other Ambulatory Visit: Payer: Self-pay | Admitting: Internal Medicine

## 2020-01-12 DIAGNOSIS — M25462 Effusion, left knee: Secondary | ICD-10-CM | POA: Diagnosis not present

## 2020-01-12 DIAGNOSIS — M533 Sacrococcygeal disorders, not elsewhere classified: Secondary | ICD-10-CM | POA: Diagnosis not present

## 2020-01-12 DIAGNOSIS — M452 Ankylosing spondylitis of cervical region: Secondary | ICD-10-CM | POA: Diagnosis not present

## 2020-01-12 DIAGNOSIS — N342 Other urethritis: Secondary | ICD-10-CM | POA: Diagnosis not present

## 2020-01-12 DIAGNOSIS — M545 Low back pain: Secondary | ICD-10-CM | POA: Diagnosis not present

## 2020-01-12 DIAGNOSIS — M25461 Effusion, right knee: Secondary | ICD-10-CM | POA: Diagnosis not present

## 2020-01-12 DIAGNOSIS — Z79899 Other long term (current) drug therapy: Secondary | ICD-10-CM | POA: Diagnosis not present

## 2020-01-19 ENCOUNTER — Telehealth: Payer: Self-pay | Admitting: Internal Medicine

## 2020-01-19 NOTE — Telephone Encounter (Signed)
Patient is calling with possible UTI- she is have frequency urinating and pelvic pain. Patient works in the same building. And is open to giving a urine sample. No available apt until 01/26/20. Please advise Cb- [phone]

## 2020-01-20 ENCOUNTER — Ambulatory Visit: Payer: Self-pay | Admitting: Internal Medicine

## 2020-01-20 NOTE — Telephone Encounter (Signed)
Schduled pt for 3pm today.

## 2020-02-05 ENCOUNTER — Emergency Department: Payer: 59

## 2020-02-05 ENCOUNTER — Other Ambulatory Visit: Payer: Self-pay | Admitting: Endocrinology

## 2020-02-05 ENCOUNTER — Other Ambulatory Visit: Payer: Self-pay

## 2020-02-05 ENCOUNTER — Emergency Department
Admission: EM | Admit: 2020-02-05 | Discharge: 2020-02-05 | Disposition: A | Discharge status: Home / Self Care | Payer: 59 | Attending: Emergency Medicine | Admitting: Emergency Medicine

## 2020-02-05 DIAGNOSIS — R112 Nausea with vomiting, unspecified: Secondary | ICD-10-CM | POA: Insufficient documentation

## 2020-02-05 DIAGNOSIS — M542 Cervicalgia: Secondary | ICD-10-CM | POA: Diagnosis not present

## 2020-02-05 DIAGNOSIS — Z87891 Personal history of nicotine dependence: Secondary | ICD-10-CM | POA: Insufficient documentation

## 2020-02-05 DIAGNOSIS — R519 Headache, unspecified: Secondary | ICD-10-CM | POA: Insufficient documentation

## 2020-02-05 DIAGNOSIS — Z79899 Other long term (current) drug therapy: Secondary | ICD-10-CM | POA: Insufficient documentation

## 2020-02-05 DIAGNOSIS — E890 Postprocedural hypothyroidism: Secondary | ICD-10-CM | POA: Diagnosis not present

## 2020-02-05 DIAGNOSIS — Z91040 Latex allergy status: Secondary | ICD-10-CM | POA: Diagnosis not present

## 2020-02-05 LAB — CBC WITH DIFFERENTIAL/PLATELET
Abs Immature Granulocytes: 0.03 10*3/uL (ref 0.00–0.07)
Basophils Absolute: 0.1 10*3/uL (ref 0.0–0.1)
Basophils Relative: 1 %
Eosinophils Absolute: 0.1 10*3/uL (ref 0.0–0.5)
Eosinophils Relative: 1 %
HCT: 40.7 % (ref 36.0–46.0)
Hemoglobin: 13.4 g/dL (ref 12.0–15.0)
Immature Granulocytes: 0 %
Lymphocytes Relative: 25 %
Lymphs Abs: 3 10*3/uL (ref 0.7–4.0)
MCH: 29 pg (ref 26.0–34.0)
MCHC: 32.9 g/dL (ref 30.0–36.0)
MCV: 88.1 fL (ref 80.0–100.0)
Monocytes Absolute: 0.5 10*3/uL (ref 0.1–1.0)
Monocytes Relative: 4 %
Neutro Abs: 8.3 10*3/uL — ABNORMAL HIGH (ref 1.7–7.7)
Neutrophils Relative %: 69 %
Platelets: 445 10*3/uL — ABNORMAL HIGH (ref 150–400)
RBC: 4.62 MIL/uL (ref 3.87–5.11)
RDW: 14.6 % (ref 11.5–15.5)
WBC: 12 10*3/uL — ABNORMAL HIGH (ref 4.0–10.5)
nRBC: 0 % (ref 0.0–0.2)

## 2020-02-05 LAB — PROTIME-INR
INR: 1 (ref 0.8–1.2)
Prothrombin Time: 12.8 seconds (ref 11.4–15.2)

## 2020-02-05 LAB — BASIC METABOLIC PANEL
Anion gap: 10 (ref 5–15)
BUN: 14 mg/dL (ref 6–20)
CO2: 24 mmol/L (ref 22–32)
Calcium: 9.5 mg/dL (ref 8.9–10.3)
Chloride: 103 mmol/L (ref 98–111)
Creatinine, Ser: 0.67 mg/dL (ref 0.44–1.00)
GFR calc Af Amer: >60 mL/min (ref >60)
GFR calc non Af Amer: >60 mL/min (ref >60)
Glucose, Bld: 107 mg/dL — ABNORMAL HIGH (ref 70–99)
Potassium: 4.2 mmol/L (ref 3.5–5.1)
Sodium: 137 mmol/L (ref 135–145)

## 2020-02-05 MED ORDER — LIDOCAINE 5 % EX PTCH
1.0000 | MEDICATED_PATCH | CUTANEOUS | Status: DC
  Administered 2020-02-05: 1 via TRANSDERMAL
  Filled 2020-02-05: qty 1

## 2020-02-05 MED ORDER — ONDANSETRON HCL 4 MG/2ML IJ SOLN
4.0000 mg | Freq: Once | INTRAMUSCULAR | Status: AC
  Administered 2020-02-05: 4 mg via INTRAVENOUS
  Filled 2020-02-05: qty 2

## 2020-02-05 MED ORDER — HYDROMORPHONE HCL 1 MG/ML IJ SOLN
1.0000 mg | Freq: Once | INTRAMUSCULAR | Status: AC
  Administered 2020-02-05: 1 mg via INTRAVENOUS
  Filled 2020-02-05: qty 1

## 2020-02-05 MED ORDER — CYCLOBENZAPRINE HCL 10 MG PO TABS
10.0000 mg | ORAL_TABLET | Freq: Once | ORAL | Status: AC
  Administered 2020-02-05: 10 mg via ORAL
  Filled 2020-02-05: qty 1

## 2020-02-05 MED ORDER — ONDANSETRON 8 MG PO TBDP
8.0000 mg | ORAL_TABLET | Freq: Once | ORAL | Status: AC
  Administered 2020-02-05: 8 mg via ORAL
  Filled 2020-02-05: qty 1

## 2020-02-05 MED ORDER — CYCLOBENZAPRINE HCL 5 MG PO TABS: 5.0000 mg | ORAL_TABLET | Freq: Three times a day (TID) | ORAL | 0 refills | Status: DC | PRN

## 2020-02-05 MED ORDER — TRAMADOL HCL 50 MG PO TABS: 50.0000 mg | ORAL_TABLET | Freq: Four times a day (QID) | ORAL | 0 refills | Status: DC | PRN

## 2020-02-05 MED FILL — LIOTHYRONINE SODIUM 5 MCG T: 5 | 30 days supply | Qty: 90 | Fill #0

## 2020-02-05 NOTE — ED Provider Notes (Signed)
[facility] Emergency Department Provider Note   ____________________________________________   First MD Initiated Contact with Patient 02/05/20 1417     (approximate)  I have reviewed the triage vital signs and the nursing notes.   HISTORY  Chief Complaint Neck Pain (see down time)    HPI Hailey [name] is a 44 y.o. female patient compliant with cute onset of headache, neck pain, and nausea without vomiting.  Patient states she awakened this morning with no complaints.  After arriving to work she started experiencing neck tightness.  Patient states the pain radiates to the left occipital area of her scalp.  Patient has a history of polyarthralgia and is started Methotrexate and is on a tapered dose of steroids this week.  Patient rates her pain as 10/10.  Patient scribed pain is "achy".  No palliative measure for complaint.  Patient called her family doctor and was told to come to the ED.         Past Medical History:  Diagnosis Date  . Allergic state   . Allergy   . Arthritis   . Asthma   . Cervical disc disease   . Depression   . GERD (gastroesophageal reflux disease)   . H/O colonoscopy   . HPV (human papilloma virus) infection   . Hypothyroidism   . Interstitial cystitis   . LGSIL (low grade squamous intraepithelial dysplasia)   . Paresthesia   . Status post laparoscopic cholecystectomy 07/13/2016    Patient Active Problem List   Diagnosis Date Noted  . Ankylosing spondylitis ([place]) 12/30/2018  . Asthma in adult without complication 99991111  . Chronic diarrhea 12/30/2018  . 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 Interstitial cystitis 07/03/2016  . Pelvic pain in female 07/03/2016  . 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 BMI 38.0-38.9,adult 02/14/2016  . Polyarthralgia 02/14/2016  . GERD (gastroesophageal reflux disease) 02/10/2014    Past Surgical History:  Procedure Laterality Date  . BREAST BIOPSY Right 10/18/2015   bx/clip- neg  . CHOLECYSTECTOMY N/A 07/07/2016   Procedure: LAPAROSCOPIC CHOLECYSTECTOMY;  Surgeon: Olean Ree, MD;  Location: [facility] ORS;  Service: General;  Laterality: N/A;  . COLONOSCOPY WITH PROPOFOL N/A 10/11/2015   Procedure: COLONOSCOPY WITH PROPOFOL;  Surgeon: Lollie Sails, MD;  Location: [facility] ENDOSCOPY;  Service: Endoscopy;  Laterality: N/A;  . ESOPHAGOGASTRODUODENOSCOPY (EGD) WITH PROPOFOL N/A 10/11/2015   Procedure: ESOPHAGOGASTRODUODENOSCOPY (EGD) WITH PROPOFOL;  Surgeon: Lollie Sails, MD;  Location: [facility] ENDOSCOPY;  Service: Endoscopy;  Laterality: N/A;  . TONSILLECTOMY    . TOTAL THYROIDECTOMY  2005   goiter    Prior to Admission medications   Medication Sig Start Date End Date Taking? Authorizing Provider  albuterol (VENTOLIN HFA) 108 (90 Base) MCG/ACT inhaler Inhale 2 puffs into the lungs every 6 (six) hours as needed for wheezing or shortness of breath. 09/15/19   Glean Hess, MD  amitriptyline (ELAVIL) 10 MG tablet Take 10 mg by mouth at bedtime as needed (Bladder spasms).     [provider]  Biotin 1 MG CAPS Take by mouth.    [provider]  cholecalciferol (VITAMIN D3) 25 MCG (1000 UNIT) tablet Take 1,000 Units by mouth daily.    [provider]  clobetasol (TEMOVATE) 0.05 % GEL Apply twice daily 04/11/19   Zara Council A, PA-C  etanercept (ENBREL SURECLICK) 50 MG/ML injection Inject 1 ml under the skin weekly. 12/10/19   Tresa Garter, MD  Fluticasone-Salmeterol (ADVAIR) 100-50 MCG/DOSE AEPB Inhale 1 puff  into the lungs 2 (two) times daily. 09/15/19   Glean Hess, MD  hydrocortisone 2.5 % cream  07/03/19   [provider]  ibuprofen (ADVIL) 200 MG tablet Take 200 mg by mouth every 6 (six) hours as needed.    [provider]  liothyronine (CYTOMEL) 5 MCG tablet TAKE 2 TABLETS BY MOUTH BEFORE BREAKFAST AND 1 TABLET BEFORE LUNCH 02/05/20   Elayne Snare, MD  minoxidil (ROGAINE) 2 % external solution Apply  topically 2 (two) times daily.    [provider]  montelukast (SINGULAIR) 10 MG tablet Take 1 tablet (10 mg total) by mouth at bedtime. 12/30/18   Glean Hess, MD  nabumetone (RELAFEN) 750 MG tablet Take 1 tablet (750 mg total) by mouth daily. 12/08/19   Glean Hess, MD  naproxen (NAPROSYN) 500 MG tablet  06/23/19   [provider]  SYNTHROID 175 MCG tablet TAKE 1 TABLET BY MOUTH DAILY BEFORE BREAKFAST. 10/14/19   Elayne Snare, MD    Allergies Codeine, Hydrocodone-acetaminophen, Lodine [etodolac], Quetiapine, Zanaflex [tizanidine hcl], Ciprofloxacin, Escitalopram, Latex, Levaquin [levofloxacin in d5w], and Sulfa antibiotics  Family History  Problem Relation Age of Onset  . Hypertension Mother   . Stroke Mother   . Cancer Mother   . Goiter Mother   . Diabetes Father   . Hypertension Father   . Hypertension Brother   . Hypertension Maternal Grandmother   . Stroke Maternal Grandmother   . Diabetes Maternal Grandmother   . Breast cancer Cousin 70       pat cousin    Social History Social History   Tobacco Use  . Smoking status: Former Smoker    Packs/day: 0.50    Years: 7.00    Pack years: 3.50    Types: Cigarettes    Quit date: 2003    Years since quitting: 18.4  . Smokeless tobacco: Never Used  Substance Use Topics  . Alcohol use: Yes    Comment: Occ.  . Drug use: No    Review of Systems Constitutional: No fever/chills Eyes: No visual changes. ENT: No sore throat. Cardiovascular: Denies chest pain. Respiratory: Denies shortness of breath. Gastrointestinal: No abdominal pain.  No nausea, no vomiting.  No diarrhea.  No constipation. Genitourinary: Negative for dysuria. Musculoskeletal: Acute posterior neck pain. Skin: Negative for rash. Neurological: Positive for headaches, but denies focal weakness or numbness. Allergic/Immunilogical: See extensive allergy list ____________________________________________   PHYSICAL EXAM:  VITAL  SIGNS: ED Triage Vitals  Enc Vitals Group     BP      Pulse      Resp      Temp      Temp src      SpO2      Weight      Height      Head Circumference      Peak Flow      Pain Score      Pain Loc      Pain Edu?      Excl. in [place]?     Constitutional: Alert and oriented. Well appearing and in no acute distress. Mouth/Throat: Mucous membranes are moist.  Oropharynx non-erythematous. Neck: No stridor.  Diffuse cervical spine tenderness to palpation. Hematological/Lymphatic/Immunilogical: No cervical lymphadenopathy. Cardiovascular: Normal rate, regular rhythm. Grossly normal heart sounds.  Good peripheral circulation. Respiratory: Normal respiratory effort.  No retractions. Lungs CTAB. Gastrointestinal: Soft and nontender. No distention. No abdominal bruits. No CVA tenderness. Genitourinary: Deferred Musculoskeletal: No lower extremity tenderness nor edema.  No joint effusions. Neurologic:  Normal speech and language. No gross focal neurologic deficits are appreciated. No gait instability. Skin:  Skin is warm, dry and intact. No rash noted. Psychiatric: Mood and affect are normal. Speech and behavior are normal.  ____________________________________________   LABS (all labs ordered are listed, but only abnormal results are displayed)  Labs Reviewed  CBC WITH DIFFERENTIAL/PLATELET  BASIC METABOLIC PANEL  PROTIME-INR   ____________________________________________  EKG   ____________________________________________  RADIOLOGY  ED MD interpretation:    Official radiology report(s): DG Cervical Spine Complete  Result Date: 02/05/2020 CLINICAL DATA:  Posterior neck pain, decreased range motion EXAM: CERVICAL SPINE - COMPLETE 4+ VIEW COMPARISON:  05/26/2009 FINDINGS: Frontal, bilateral oblique, and lateral views of the cervical spine are obtained. Alignment is anatomic to the cervicothoracic junction. No acute displaced fractures. There is mild facet hypertrophy from C3  through C6. Mild disc space narrowing and osteophyte formation at C5/C6. Neural foramina are grossly patent. Soft tissues are normal. Lung apices are clear. Postsurgical changes from thyroidectomy. IMPRESSION: 1. Mid cervical degenerative changes, no acute bony abnormality. Electronically Signed   By: Randa Ngo M.D.   On: 02/05/2020 16:08   CT Head Wo Contrast  Result Date: 02/05/2020 CLINICAL DATA:  Headache, nausea and vomiting EXAM: CT HEAD WITHOUT CONTRAST TECHNIQUE: Contiguous axial images were obtained from the base of the skull through the vertex without intravenous contrast. COMPARISON:  03/03/2016 FINDINGS: Brain: No acute infarct or hemorrhage. Lateral ventricles and midline structures are unremarkable. No acute extra-axial fluid collections. No mass effect. Vascular: No hyperdense vessel or unexpected calcification. Skull: Normal. Negative for fracture or focal lesion. Sinuses/Orbits: No acute finding. Other: None. IMPRESSION: No acute intracranial process. Electronically Signed   By: Randa Ngo M.D.   On: 02/05/2020 16:07    ____________________________________________   PROCEDURES  Procedure(s) performed (including Critical Care):  Procedures   ____________________________________________   INITIAL IMPRESSION / ASSESSMENT AND PLAN / ED COURSE  As part of my medical decision making, I reviewed the following data within the [facility]      Patient presents with headache and neck pain which began around 9:00 this morning.  Discussed negative head CT findings.  Discussed x-ray finding consistent with mild arthritic changes.  Patient voices concern for meningitis Dr. Bland Span will assume care for patient.        ____________________________________________   FINAL CLINICAL IMPRESSION(S) / ED DIAGNOSES  Final diagnoses:  Headache in back of head  Neck pain     ED Discharge Orders    None       Note:  This document was prepared using Dragon  voice recognition software and may include unintentional dictation errors.    Sable Feil, PA-C 02/05/20 1705    Duffy Bruce, MD 02/05/20 2258

## 2020-02-05 NOTE — Discharge Instructions (Addendum)
As we discussed, your symptoms are likely musculoskeletal or potentially related to the methotrexate.  For now, I see no evidence to suggest bacterial infection.  Current plan would be to rest, take the muscle relaxant and tramadol in addition to Tylenol, and see how you feel.  Call your rheumatologist tomorrow to discuss your ER visit.  The next step would involve getting a lumbar puncture.  If you develop any fever, chills, worsening headache, worsening neck pain, or any other concerning symptoms, I would return to the ER immediately to pursue this.

## 2020-02-05 NOTE — ED Notes (Signed)
Presents presents with family  Pt was triaged on downtime sheet  Recently started on Methotrexate    Now having some n/v  Feeling some pain in neck also

## 2020-02-05 NOTE — ED Notes (Signed)
Pt transported to CT via [facility] and CT tech

## 2020-02-20 ENCOUNTER — Other Ambulatory Visit: Payer: Self-pay

## 2020-02-20 ENCOUNTER — Other Ambulatory Visit
Admission: RE | Admit: 2020-02-20 | Discharge: 2020-02-20 | Disposition: A | Discharge status: Home / Self Care | Payer: 59 | Attending: Endocrinology | Admitting: Endocrinology

## 2020-02-20 DIAGNOSIS — E890 Postprocedural hypothyroidism: Secondary | ICD-10-CM | POA: Diagnosis not present

## 2020-02-20 LAB — T4, FREE: Free T4: 0.9 ng/dL (ref 0.61–1.12)

## 2020-02-20 LAB — TSH: TSH: 7.288 u[IU]/mL — ABNORMAL HIGH (ref 0.350–4.500)

## 2020-02-21 LAB — T3, FREE: T3, Free: 4.4 pg/mL (ref 2.0–4.4)

## 2020-02-23 ENCOUNTER — Other Ambulatory Visit: Payer: Self-pay

## 2020-02-23 ENCOUNTER — Ambulatory Visit: Payer: 59 | Admitting: Endocrinology

## 2020-02-23 ENCOUNTER — Encounter: Payer: Self-pay | Admitting: Endocrinology

## 2020-02-23 VITALS — BP 120/80 | HR 71 | Ht 64.0 in | Wt 239.4 lb

## 2020-02-23 DIAGNOSIS — E890 Postprocedural hypothyroidism: Secondary | ICD-10-CM

## 2020-02-23 MED ORDER — SYNTHROID 200 MCG PO TABS: 200.0000 ug | ORAL_TABLET | Freq: Every day | ORAL | 2 refills | Status: DC

## 2020-02-23 NOTE — Progress Notes (Signed)
Patient ID: Hailey [name], female   DOB: 12-Dec-1975, 44 y.o.   MRN: [id number]             Referring Physician: Sabra Heck    Reason for Appointment:  Hypothyroidism, follow-up visit    History of Present Illness:    Hypothyroidism was first diagnosed in 2006 after thyroidectomy. After her thyroid surgery she was put on levothyroxine, unknown doses Subsequently she went to an endocrinologist who felt that she will do better with a combination of Cytomel and levothyroxine since she was having weight gain The patient does not think she felt any better or had any significant weight loss with this regimen but this was continued until 04/2016 She had gone to another physician for a second opinion and she was switched to Synthroid from her previous regimen of 88 g levothyroxine and 25 g of Cytomel because of her free T3 level being high at 5.8  On her initial consultation she was complaining about weight gain, joint pains and fatigue Since her TSH was 14.7 her dose was increased from the previous 150 g up to 200 g  Recent history: Since she was having significant fatigue with 200 g Synthroid alone she has been on a regimen of Cytomel daily along with Synthroid 150 g daily since 2018  She has done better subjectively with liothyronine combination compared to Synthroid alone Previously had complaints of fatigue and afternoon somnolence but would not remember to take the second pill of liothyronine at lunch  Recently she has had more fatigue but she thinks this is from other problems and medications  She does take her morning levothyroxine and liothyronine daily consistently on empty stomach and her vitamins at lunch Has not missed any doses  Synthroid dose has been increased to 175 as of 5/20  Labs show her TSH is higher at 7.3, previously normal   Free T4 is about the same and free T3 high normal at 4.4 compared to 3.6 done from a different lab   Patient's weight history is as  follows:  Wt Readings from Last 3 Encounters:  02/23/20 239 lb 6.4 oz (108.6 kg)  12/08/19 234 lb (106.1 kg)  09/15/19 237 lb (107.5 kg)    Thyroid function results have been as follows:  Lab Results  Component Value Date   FREET4 0.90 02/20/2020   FREET4 0.85 10/23/2019   FREET4 1.05 09/15/2019   TSH 7.288 (H) 02/20/2020   TSH 2.196 10/23/2019   TSH 4.340 09/15/2019   Lab Results  Component Value Date   T3FREE 4.4 02/20/2020   T3FREE 3.6 10/23/2019   T3FREE 3.0 04/17/2019   T3FREE 3.3 02/05/2019    The following is a copy of her surgical assessment prior to her thyroid surgery:  Patient has a history of hyperthyroidism who had a dominant right thyroid nodule identified by her endocrinologist. This nodule is photopenic and ultrasound guided biopsy has demonstrated a follicular lesion with scant colloid. 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 Her endocrinologist has recommended subtotal thyroidectomy to take care of both the nodule in the right thyroid lobe as well as the hyperfunctioning left thyroid tissue  Apparently she had Hashimoto's thyroiditis in the left lobe on pathology  Past Medical History:  Diagnosis Date  . Allergic state   . Allergy   . Arthritis   . Asthma   . Cervical disc disease   . Depression   . GERD (  gastroesophageal reflux disease)   . H/O colonoscopy   . HPV (human papilloma virus) infection   . Hypothyroidism   . Interstitial cystitis   . LGSIL (low grade squamous intraepithelial dysplasia)   . Paresthesia   . Status post laparoscopic cholecystectomy 07/13/2016    Past Surgical History:  Procedure Laterality Date  . BREAST BIOPSY Right 10/18/2015   bx/clip- neg  . CHOLECYSTECTOMY N/A 07/07/2016   Procedure: LAPAROSCOPIC CHOLECYSTECTOMY;  Surgeon: Olean Ree, MD;  Location: [facility] ORS;  Service: General;  Laterality: N/A;  .  COLONOSCOPY WITH PROPOFOL N/A 10/11/2015   Procedure: COLONOSCOPY WITH PROPOFOL;  Surgeon: Lollie Sails, MD;  [facility] ENDOSCOPY;  Service: Endoscopy;  Laterality: N/A;  . ESOPHAGOGASTRODUODENOSCOPY (EGD) WITH PROPOFOL N/A 10/11/2015   Procedure: ESOPHAGOGASTRODUODENOSCOPY (EGD) WITH PROPOFOL;  Surgeon: Lollie Sails, MD;  Location: [facility] ENDOSCOPY;  Service: Endoscopy;  Laterality: N/A;  . TONSILLECTOMY    . TOTAL THYROIDECTOMY  2005   goiter    Family History  Problem Relation Age of Onset  . Hypertension Mother   . Stroke Mother   . Cancer Mother   . Goiter Mother   . Diabetes Father   . Hypertension Father   . Hypertension Brother   . Hypertension Maternal Grandmother   . Stroke Maternal Grandmother   . Diabetes Maternal Grandmother   . Breast cancer Cousin 75       pat cousin    Social History:  reports that she quit smoking about 18 years ago. Her smoking use included cigarettes. She has a 3.50 pack-year smoking history. She has never used smokeless tobacco. She reports current alcohol use. She reports that she does not use drugs.  Allergies:  Allergies  Allergen Reactions  . Codeine Other (See Comments)    Hullacinations  . Hydrocodone-Acetaminophen Other (See Comments)    Cannot urinate on medication  . Lodine [Etodolac] Shortness Of Breath  . Quetiapine Other (See Comments)    Excessive sedation at 50 mg.  . Zanaflex [Tizanidine Hcl]   . Ciprofloxacin Hives  . Escitalopram Other (See Comments)    Intolerant.  . Latex Rash  . Levaquin [Levofloxacin In D5w] Rash    HIVES  . Sulfa Antibiotics Other (See Comments)    Can't remember reactions    Allergies as of 02/23/2020      Reactions   Codeine Other (See Comments)   Hullacinations   Hydrocodone-acetaminophen Other (See Comments)   Cannot urinate on medication   Lodine [etodolac] Shortness Of Breath   Quetiapine Other (See Comments)   Excessive sedation at 50 mg.   Zanaflex [tizanidine Hcl]      Ciprofloxacin Hives   Escitalopram Other (See Comments)   Intolerant.   Latex Rash   Levaquin [levofloxacin In D5w] Rash   HIVES   Sulfa Antibiotics Other (See Comments)   Can't remember reactions      Medication List       Accurate as of February 23, 2020 10:11 AM. If you have any questions, ask your nurse or doctor.        albuterol 108 (90 Base) MCG/ACT inhaler Commonly known as: VENTOLIN HFA Inhale 2 puffs into the lungs every 6 (six) hours as needed for wheezing or shortness of breath.   amitriptyline 10 MG tablet Commonly known as: ELAVIL Take 10 mg by mouth at bedtime as needed (Bladder spasms).   Biotin 1 MG Caps Take by mouth.   cholecalciferol 25 MCG (1000 UNIT) tablet Commonly known as: VITAMIN D3  Take 1,000 Units by mouth daily.   clobetasol 0.05 % Gel Commonly known as: TEMOVATE Apply twice daily   cyclobenzaprine 5 MG tablet Commonly known as: FLEXERIL Take 1 tablet (5 mg total) by mouth 3 (three) times daily as needed for muscle spasms.   Enbrel SureClick 50 MG/ML injection Generic drug: etanercept Inject 1 ml under the skin weekly.   Fluticasone-Salmeterol 100-50 MCG/DOSE Aepb Commonly known as: ADVAIR Inhale 1 puff into the lungs 2 (two) times daily.   folic acid 1 MG tablet Commonly known as: FOLVITE Take by mouth.   hydrocortisone 2.5 % cream   ibuprofen 200 MG tablet Commonly known as: ADVIL Take 200 mg by mouth every 6 (six) hours as needed.   liothyronine 5 MCG tablet Commonly known as: CYTOMEL TAKE 2 TABLETS BY MOUTH BEFORE BREAKFAST AND 1 TABLET BEFORE LUNCH   methotrexate 2.5 MG tablet Commonly known as: RHEUMATREX Take by mouth.   minoxidil 2 % external solution Commonly known as: ROGAINE Apply topically 2 (two) times daily.   montelukast 10 MG tablet Commonly known as: SINGULAIR Take 1 tablet (10 mg total) by mouth at bedtime.   nabumetone 750 MG tablet Commonly known as: RELAFEN Take 1 tablet (750 mg total) by  mouth daily.   naproxen 500 MG tablet Commonly known as: NAPROSYN   Synthroid 175 MCG tablet Generic drug: levothyroxine TAKE 1 TABLET BY MOUTH DAILY BEFORE BREAKFAST.   traMADol 50 MG tablet Commonly known as: Ultram Take 1 tablet (50 mg total) by mouth every 6 (six) hours as needed for severe pain.          Review of Systems       She is on amitriptyline 10 mg for overactive bladder and interstitial cystitis,  She has history of depression in the past and does not like to take medications, she thinks Lexapro caused bladder symptoms and higher doses  She has knee pains and may have ankylosing spondylitis, followed by rheumatologist         Examination:    BP 120/80 (BP Location: Right Arm, Patient Position: Sitting, Cuff Size: Large)   Pulse 71   Ht 5\' 4"  (1.626 m)   Wt 239 lb 6.4 oz (108.6 kg)   LMP  (LMP Unknown)   SpO2 97%   BMI 41.09 kg/m      Assessment:  HYPOTHYROIDISM, Postsurgical and also history of Hashimoto's thyroiditis  She has been on levothyroxine 175 mcg and liothyronine 10 mcg daily  Recently she feels fairly good recently  TSH is back to normal at 2.2 this month Free T3 which was is relatively low at 3.0 last month is improved and free T4 is stable  Her only symptom is now hair loss which appears to be unrelated  She has been compliant with her levothyroxine and liothyronine every morning   PLAN:   She will now take only 5 mg alternating with 10 mcg of Cytomel SYNTHROID will be changed to 200 mcg and reassured her that this is not an excessive dose for her weight  She can continue to take brand name Synthroid  Co-pay card given  She will follow-up in 6 weeks  Hasana Alcorta 02/23/2020, 10:11 AM     Note: This office note was prepared with Dragon voice recognition system technology. Any transcriptional errors that result from this process are unintentional.   Elayne Snare

## 2020-02-23 NOTE — Patient Instructions (Signed)
Take 1 alt with 2 of Liothyronine

## 2020-03-25 ENCOUNTER — Other Ambulatory Visit: Payer: Self-pay

## 2020-03-25 ENCOUNTER — Encounter: Payer: Self-pay | Admitting: Internal Medicine

## 2020-03-25 DIAGNOSIS — E890 Postprocedural hypothyroidism: Secondary | ICD-10-CM

## 2020-04-05 DIAGNOSIS — M65862 Other synovitis and tenosynovitis, left lower leg: Secondary | ICD-10-CM | POA: Diagnosis not present

## 2020-04-05 DIAGNOSIS — M65861 Other synovitis and tenosynovitis, right lower leg: Secondary | ICD-10-CM | POA: Diagnosis not present

## 2020-04-12 ENCOUNTER — Ambulatory Visit: Payer: 59 | Admitting: Endocrinology

## 2020-04-16 DIAGNOSIS — Z20822 Contact with and (suspected) exposure to covid-19: Secondary | ICD-10-CM | POA: Diagnosis not present

## 2020-05-18 DIAGNOSIS — B372 Candidiasis of skin and nail: Secondary | ICD-10-CM | POA: Diagnosis not present

## 2020-05-18 DIAGNOSIS — B356 Tinea cruris: Secondary | ICD-10-CM | POA: Diagnosis not present

## 2020-05-28 DIAGNOSIS — E890 Postprocedural hypothyroidism: Secondary | ICD-10-CM | POA: Diagnosis not present

## 2020-05-31 ENCOUNTER — Other Ambulatory Visit: Payer: Self-pay | Admitting: Internal Medicine

## 2020-05-31 DIAGNOSIS — M452 Ankylosing spondylitis of cervical region: Secondary | ICD-10-CM | POA: Diagnosis not present

## 2020-05-31 DIAGNOSIS — Z79899 Other long term (current) drug therapy: Secondary | ICD-10-CM | POA: Diagnosis not present

## 2020-06-09 ENCOUNTER — Encounter: Payer: Self-pay | Admitting: Emergency Medicine

## 2020-06-09 ENCOUNTER — Ambulatory Visit
Admission: EM | Admit: 2020-06-09 | Discharge: 2020-06-09 | Disposition: A | Discharge status: Home / Self Care | Payer: 59 | Attending: Emergency Medicine | Admitting: Emergency Medicine

## 2020-06-09 ENCOUNTER — Other Ambulatory Visit: Payer: Self-pay

## 2020-06-09 DIAGNOSIS — Z20822 Contact with and (suspected) exposure to covid-19: Secondary | ICD-10-CM | POA: Insufficient documentation

## 2020-06-09 LAB — SARS CORONAVIRUS 2 (TAT 6-24 HRS): SARS Coronavirus 2: NEGATIVE

## 2020-06-09 NOTE — ED Triage Notes (Signed)
Patient here for COVID test, no symptoms, positive exposure

## 2020-06-09 NOTE — Discharge Instructions (Signed)

## 2020-06-14 ENCOUNTER — Other Ambulatory Visit: Payer: Self-pay | Admitting: Urology

## 2020-06-14 NOTE — Telephone Encounter (Signed)
Per verbal from [place] pt needs to be seen refill denied.

## 2020-06-18 DIAGNOSIS — Z20822 Contact with and (suspected) exposure to covid-19: Secondary | ICD-10-CM | POA: Diagnosis not present

## 2020-06-18 DIAGNOSIS — Z03818 Encounter for observation for suspected exposure to other biological agents ruled out: Secondary | ICD-10-CM | POA: Diagnosis not present

## 2020-06-18 DIAGNOSIS — H66001 Acute suppurative otitis media without spontaneous rupture of ear drum, right ear: Secondary | ICD-10-CM | POA: Diagnosis not present

## 2020-06-18 DIAGNOSIS — J019 Acute sinusitis, unspecified: Secondary | ICD-10-CM | POA: Diagnosis not present

## 2020-06-26 DIAGNOSIS — Z20828 Contact with and (suspected) exposure to other viral communicable diseases: Secondary | ICD-10-CM | POA: Diagnosis not present

## 2020-06-26 DIAGNOSIS — R058 Other specified cough: Secondary | ICD-10-CM | POA: Diagnosis not present

## 2020-08-03 ENCOUNTER — Other Ambulatory Visit: Payer: Self-pay | Admitting: Internal Medicine

## 2020-08-27 ENCOUNTER — Other Ambulatory Visit: Payer: Self-pay

## 2020-08-30 DIAGNOSIS — E890 Postprocedural hypothyroidism: Secondary | ICD-10-CM | POA: Diagnosis not present

## 2020-08-31 ENCOUNTER — Other Ambulatory Visit: Payer: Self-pay | Admitting: Registered Nurse

## 2020-08-31 DIAGNOSIS — M452 Ankylosing spondylitis of cervical region: Secondary | ICD-10-CM | POA: Diagnosis not present

## 2020-08-31 DIAGNOSIS — M170 Bilateral primary osteoarthritis of knee: Secondary | ICD-10-CM | POA: Diagnosis not present

## 2020-08-31 DIAGNOSIS — M898X9 Other specified disorders of bone, unspecified site: Secondary | ICD-10-CM | POA: Diagnosis not present

## 2020-08-31 DIAGNOSIS — Z79899 Other long term (current) drug therapy: Secondary | ICD-10-CM | POA: Diagnosis not present

## 2020-09-02 ENCOUNTER — Telehealth: Payer: Self-pay | Admitting: Internal Medicine

## 2020-09-02 ENCOUNTER — Ambulatory Visit: Payer: Self-pay | Admitting: *Deleted

## 2020-09-02 NOTE — Telephone Encounter (Signed)
Patient is calling because her WBC is elevated. Patient would like to come in however there are no available appts on Monday. Patient has been having chills for 3 days, with no fever, boday aches. Patient would like to know what she should do?  Cb- [phone]  C/o burning during urination, some pelvic pain x 2 weeks. Patient reports chills x 3 days. Denies fever, body aches, back pain. Patient reports she noted elevation in WBC during recent OV at [facility]. Patient requesting appt today and no available OV today. Patient requesting to send urine sample. Care advise given. Patient verbalized understanding of care advise and to call back or go to [facility] or ED if symptoms worsen and to call back for appt when she is ready.   Reason for Disposition . All other urine symptoms  Answer Assessment - Initial Assessment Questions 1. SYMPTOM: "What's the main symptom you're concerned about?" (e.g., frequency, incontinence)    Recent visit to Rheumatologist blood work showed elevated WBC. C/o burning during urination, and pelvic pain and started 2 weeks ago  2. ONSET: "When did the  Pain   start?"     2 weeks ago  3. PAIN: "Is there any pain?" If Yes, ask: "How bad is it?" (Scale: 1-10; mild, moderate, severe)     Mild  4. CAUSE: "What do you think is causing the symptoms?"     Not sure  5. OTHER SYMPTOMS: "Do you have any other symptoms?" (e.g., fever, flank pain, blood in urine, pain with urination)     Pelvic pain, burning urinating  6. PREGNANCY: "Is there any chance you are pregnant?" "When was your last menstrual period?"     na  Protocols used: URINARY [facility]

## 2020-09-03 DIAGNOSIS — N760 Acute vaginitis: Secondary | ICD-10-CM | POA: Diagnosis not present

## 2020-09-03 DIAGNOSIS — N390 Urinary tract infection, site not specified: Secondary | ICD-10-CM | POA: Diagnosis not present

## 2020-09-03 DIAGNOSIS — R3989 Other symptoms and signs involving the genitourinary system: Secondary | ICD-10-CM | POA: Diagnosis not present

## 2020-09-06 ENCOUNTER — Other Ambulatory Visit: Payer: Self-pay | Admitting: Internal Medicine

## 2020-09-06 DIAGNOSIS — E890 Postprocedural hypothyroidism: Secondary | ICD-10-CM | POA: Diagnosis not present

## 2020-09-06 DIAGNOSIS — Z6838 Body mass index (BMI) 38.0-38.9, adult: Secondary | ICD-10-CM | POA: Diagnosis not present

## 2020-09-06 DIAGNOSIS — E6609 Other obesity due to excess calories: Secondary | ICD-10-CM | POA: Diagnosis not present

## 2020-09-17 ENCOUNTER — Other Ambulatory Visit: Payer: Self-pay | Admitting: Family Medicine

## 2020-09-23 ENCOUNTER — Telehealth: Payer: Self-pay

## 2020-09-23 NOTE — Telephone Encounter (Signed)
Copied from [place] [phone]. Topic: General - Other >> Sep 23, 2020  1:35 PM Leward Quan A wrote: Reason for CRM: Patient called in want to schedule an appointment with Dr Army Melia for tomorrow afternoon stated that she was seen at the urgent care for a UTI which she is still experiencing symptoms from. Asking for a call if she can be seen tomorrow Ph# [phone]

## 2020-09-23 NOTE — Telephone Encounter (Signed)
Please call and schedule appt for UTI. Monday/Tuesday if something is available. No available appt tomorrow.  KP

## 2020-09-23 NOTE — Telephone Encounter (Signed)
Called Pt to make appointment - LMOM.

## 2020-10-11 ENCOUNTER — Ambulatory Visit: Payer: 59 | Admitting: Internal Medicine

## 2020-10-11 ENCOUNTER — Other Ambulatory Visit: Payer: Self-pay

## 2020-10-11 ENCOUNTER — Encounter: Payer: Self-pay | Admitting: Internal Medicine

## 2020-10-11 ENCOUNTER — Other Ambulatory Visit: Payer: Self-pay | Admitting: Internal Medicine

## 2020-10-11 VITALS — BP 112/78 | HR 79 | Temp 97.8°F | Ht 64.0 in | Wt 226.0 lb

## 2020-10-11 DIAGNOSIS — N3000 Acute cystitis without hematuria: Secondary | ICD-10-CM

## 2020-10-11 MED ORDER — AMPICILLIN 500 MG PO CAPS: 500.0000 mg | ORAL_CAPSULE | Freq: Four times a day (QID) | ORAL | 0 refills | Status: DC

## 2020-10-11 NOTE — Progress Notes (Signed)
Date:  10/11/2020   Name:  Hailey [name]   DOB:  Dec 09, 1975   MRN:  [id number]   Chief Complaint: Urinary Tract Infection (Seen [facility] for UTI and they did a culture and it came back for strep B. Still having sxs. Urinating without a full stream. Leaking urine like before and some abdominal pain. )  Urinary Tract Infection  This is a new problem. The current episode started in the past 7 days. The quality of the pain is described as burning. The pain is mild. There has been no fever. Associated symptoms include frequency and urgency. Pertinent negatives include no chills or hematuria (one episode).  Symptoms seem to improve but then have been recurrent over the past week. UCx + for Beta hemolytic strep at [facility]. Treated with Omnicef.  Lab Results  Component Value Date   CREATININE 0.67 02/05/2020   BUN 14 02/05/2020   NA 137 02/05/2020   K 4.2 02/05/2020   CL 103 02/05/2020   CO2 24 02/05/2020   Lab Results  Component Value Date   CHOL 181 09/15/2019   HDL 70 09/15/2019   LDLCALC 91 09/15/2019   TRIG 116 09/15/2019   CHOLHDL 2.6 09/15/2019   Lab Results  Component Value Date   TSH 7.288 (H) 02/20/2020   Lab Results  Component Value Date   HGBA1C 5.8 (H) 09/15/2019   Lab Results  Component Value Date   WBC 12.0 (H) 02/05/2020   HGB 13.4 02/05/2020   HCT 40.7 02/05/2020   MCV 88.1 02/05/2020   PLT 445 (H) 02/05/2020   Lab Results  Component Value Date   ALT 22 12/05/2019   AST 21 12/05/2019   ALKPHOS 137 (A) 12/05/2019   BILITOT 0.3 09/15/2019     Review of Systems  Constitutional: Negative for chills, diaphoresis and fever.  Respiratory: Negative for chest tightness and shortness of breath.   Cardiovascular: Negative for chest pain.  Genitourinary: Positive for dysuria, frequency and urgency. Negative for hematuria (one episode).  Musculoskeletal: Negative for back pain.  Neurological: Negative for dizziness and syncope.    Patient Active Problem List    Diagnosis Date Noted  . Ankylosing spondylitis (HCC) 12/30/2018  . Asthma in adult without complication 12/30/2018  . Chronic diarrhea 12/30/2018  . 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 Interstitial cystitis 07/03/2016  . Pelvic pain in female 07/03/2016  . 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 BMI 38.0-38.9,adult 02/14/2016  . Polyarthralgia 02/14/2016  . GERD (gastroesophageal reflux disease) 02/10/2014    Allergies  Allergen Reactions  . Codeine Other (See Comments)    Hullacinations  . Hydrocodone-Acetaminophen Other (See Comments)    Cannot urinate on medication  . Lodine [Etodolac] Shortness Of Breath  . Quetiapine Other (See Comments)    Excessive sedation at 50 mg.  . Zanaflex [Tizanidine Hcl]   . Ciprofloxacin Hives  . Escitalopram Other (See Comments)    Intolerant.  . Latex Rash  . Levaquin [Levofloxacin In D5w] Rash    HIVES  . Sulfa Antibiotics Other (See Comments)    Can't remember reactions    Past Surgical History:  Procedure Laterality Date  . BREAST BIOPSY Right 10/18/2015   bx/clip- neg  . CHOLECYSTECTOMY N/A 07/07/2016   Procedure: LAPAROSCOPIC CHOLECYSTECTOMY;  Surgeon: Henrene DodgeJose Piscoya, MD;  Location: [facility] ORS;  Service: General;  Laterality: N/A;  . COLONOSCOPY WITH PROPOFOL N/A 10/11/2015   Procedure: COLONOSCOPY WITH PROPOFOL;  Surgeon: Christena DeemMartin U Skulskie, MD;  [facility] END[facility]COPY;  Service:  Endoscopy;  Laterality: N/A;  . ESOPHAGOGASTRODUODENOSCOPY (EGD) WITH PROPOFOL N/A 10/11/2015   Procedure: ESOPHAGOGASTRODUODENOSCOPY (EGD) WITH PROPOFOL;  Surgeon: Lollie Sails, MD;  Location: [facility] ENDOSCOPY;  Service: Endoscopy;  Laterality: N/A;  . TONSILLECTOMY    . TOTAL THYROIDECTOMY  2005   goiter    Social History   Tobacco Use  . Smoking status: Former Smoker    Packs/day: 0.50    Years: 7.00    Pack years: 3.50    Types: Cigarettes    Quit date: 2003    Years since quitting: 19.0  . Smokeless tobacco: Never Used  Vaping  Use  . Vaping Use: Never used  Substance Use Topics  . Alcohol use: Yes    Comment: Occ.  . Drug use: No     Medication list has been reviewed and updated.  Current Meds  Medication Sig  . albuterol (VENTOLIN HFA) 108 (90 Base) MCG/ACT inhaler Inhale 2 puffs into the lungs every 6 (six) hours as needed for wheezing or shortness of breath.  . Biotin 1 MG CAPS Take by mouth.  . cholecalciferol (VITAMIN D3) 25 MCG (1000 UNIT) tablet Take 1,000 Units by mouth daily.  . clobetasol (TEMOVATE) 0.05 % GEL Apply twice daily  . Fluticasone-Salmeterol (ADVAIR) 100-50 MCG/DOSE AEPB Inhale 1 puff into the lungs 2 (two) times daily.  . hydrocortisone 2.5 % cream   . ibuprofen (ADVIL) 200 MG tablet Take 200 mg by mouth every 6 (six) hours as needed.  Marland Kitchen liothyronine (CYTOMEL) 5 MCG tablet TAKE 2 TABLETS BY MOUTH BEFORE BREAKFAST AND 1 TABLET BEFORE LUNCH (Patient taking differently: Take 5 mcg by mouth daily. 1 tablet daily)  . SYNTHROID 200 MCG tablet Take 1 tablet (200 mcg total) by mouth daily before breakfast. (Patient taking differently: Take 175 mcg by mouth daily before breakfast.)    PHQ 2/9 Scores 10/11/2020 12/08/2019 09/15/2019 05/09/2019  PHQ - 2 Score 0 0 0 0  PHQ- 9 Score 6 0 - 12    GAD 7 : Generalized Anxiety Score 10/11/2020  Nervous, Anxious, on Edge 0  Control/stop worrying 0  Worry too much - different things 0  Trouble relaxing 0  Restless 0  Easily annoyed or irritable 0  Afraid - awful might happen 0  Total GAD 7 Score 0  Anxiety Difficulty Not difficult at all    BP Readings from Last 3 Encounters:  10/11/20 112/78  02/23/20 120/80  12/08/19 124/70    Physical Exam Vitals and nursing note reviewed.  Constitutional:      General: She is not in acute distress.    Appearance: Normal appearance. She is well-developed and well-nourished.  Cardiovascular:     Rate and Rhythm: Normal rate and regular rhythm.     Heart sounds: Normal heart sounds.  Pulmonary:      Effort: Pulmonary effort is normal. No respiratory distress.     Breath sounds: Normal breath sounds.  Abdominal:     General: Bowel sounds are normal.     Palpations: Abdomen is soft.     Tenderness: There is abdominal tenderness in the suprapubic area. There is no CVA tenderness, right CVA tenderness, left CVA tenderness, guarding or rebound.  Neurological:     Mental Status: She is alert.  Psychiatric:        Mood and Affect: Mood and affect and mood normal.     Wt Readings from Last 3 Encounters:  10/11/20 226 lb (102.5 kg)  02/23/20 239 lb 6.4 oz (108.6  kg)  12/08/19 234 lb (106.1 kg)    BP 112/78   Pulse 79   Temp 97.8 F (36.6 C) (Oral)   Ht 5\' 4"  (1.626 m)   Wt 226 lb (102.5 kg)   LMP 10/08/2020 (Exact Date)   SpO2 98%   BMI 38.79 kg/m   Assessment and Plan: 1. Acute cystitis without hematuria Will treat for strep per culture If sx persist, will repeat Urine culture - ampicillin (PRINCIPEN) 500 MG capsule; Take 1 capsule (500 mg total) by mouth 4 (four) times daily for 7 days.  Dispense: 28 capsule; Refill: 0   Partially dictated using Editor, commissioning. Any errors are unintentional.  Halina Maidens, MD [facility]  10/11/2020

## 2020-10-20 ENCOUNTER — Ambulatory Visit
Admission: RE | Admit: 2020-10-20 | Discharge: 2020-10-20 | Disposition: A | Discharge status: Home / Self Care | Payer: 59 | Attending: Internal Medicine | Admitting: Internal Medicine

## 2020-10-20 ENCOUNTER — Ambulatory Visit: Payer: 59 | Admitting: Internal Medicine

## 2020-10-20 ENCOUNTER — Other Ambulatory Visit: Payer: Self-pay

## 2020-10-20 ENCOUNTER — Ambulatory Visit
Admission: RE | Admit: 2020-10-20 | Discharge: 2020-10-20 | Disposition: A | Discharge status: Home / Self Care | Payer: 59 | Source: Ambulatory Visit | Attending: Internal Medicine | Admitting: Internal Medicine

## 2020-10-20 ENCOUNTER — Ambulatory Visit: Payer: Self-pay | Admitting: Internal Medicine

## 2020-10-20 ENCOUNTER — Encounter: Payer: Self-pay | Admitting: Internal Medicine

## 2020-10-20 VITALS — BP 122/98 | HR 79 | Temp 97.5°F | Ht 64.0 in | Wt 226.0 lb

## 2020-10-20 DIAGNOSIS — M25561 Pain in right knee: Secondary | ICD-10-CM | POA: Diagnosis not present

## 2020-10-20 DIAGNOSIS — G8929 Other chronic pain: Secondary | ICD-10-CM

## 2020-10-20 DIAGNOSIS — R079 Chest pain, unspecified: Secondary | ICD-10-CM | POA: Insufficient documentation

## 2020-10-20 DIAGNOSIS — M25562 Pain in left knee: Secondary | ICD-10-CM

## 2020-10-20 DIAGNOSIS — M65862 Other synovitis and tenosynovitis, left lower leg: Secondary | ICD-10-CM | POA: Diagnosis not present

## 2020-10-20 NOTE — Progress Notes (Addendum)
Date:  10/20/2020   Name:  Hailey [name]   DOB:  Mar 22, 1976   MRN:  [id number]   Chief Complaint: Chest Pain (X 3 days. Chest pain and nausea. Pressure in chest at rest.)  Chest Pain  This is a new problem. The current episode started in the past 7 days. The problem occurs constantly. The pain is present in the substernal region. The pain is mild. The quality of the pain is described as tightness. The pain does not radiate. Associated symptoms include diaphoresis (when knee pain flares) and nausea. Pertinent negatives include no cough, dizziness, exertional chest pressure, fever, palpitations or shortness of breath. Associated with: worsening bilateral knee pain. She has tried analgesics for the symptoms. The treatment provided mild relief.  Knee Pain  There was no injury mechanism. The pain is present in the left knee and right knee. The quality of the pain is described as aching and burning. The pain is severe. The pain has been worsening since onset. Treatments tried: on MTX for the past year with no improvement.  Took tramadol last night which helped some.  Has Ortho appt later today to hopefully get knees injected and fluid drawn off.  She is also going to follow up with Rheum for AS/inflammatory arthritis.  Lab Results  Component Value Date   CREATININE 0.67 02/05/2020   BUN 14 02/05/2020   NA 137 02/05/2020   K 4.2 02/05/2020   CL 103 02/05/2020   CO2 24 02/05/2020   Lab Results  Component Value Date   CHOL 181 09/15/2019   HDL 70 09/15/2019   LDLCALC 91 09/15/2019   TRIG 116 09/15/2019   CHOLHDL 2.6 09/15/2019   Lab Results  Component Value Date   TSH 7.288 (H) 02/20/2020   Lab Results  Component Value Date   HGBA1C 5.8 (H) 09/15/2019   Lab Results  Component Value Date   WBC 12.0 (H) 02/05/2020   HGB 13.4 02/05/2020   HCT 40.7 02/05/2020   MCV 88.1 02/05/2020   PLT 445 (H) 02/05/2020   Lab Results  Component Value Date   ALT 22 12/05/2019   AST 21  12/05/2019   ALKPHOS 137 (A) 12/05/2019   BILITOT 0.3 09/15/2019     Review of Systems  Constitutional: Positive for diaphoresis (when knee pain flares). Negative for chills and fever.  Respiratory: Positive for chest tightness. Negative for cough, shortness of breath and wheezing.   Cardiovascular: Positive for chest pain. Negative for palpitations and leg swelling.  Gastrointestinal: Positive for nausea.  Musculoskeletal: Positive for arthralgias, gait problem and joint swelling.  Neurological: Negative for dizziness.    Patient Active Problem List   Diagnosis Date Noted  . Ankylosing spondylitis ([place]) 12/30/2018  . Asthma in adult without complication 96/29/5284  . Chronic diarrhea 12/30/2018  . 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 Interstitial cystitis 07/03/2016  . Pelvic pain in female 07/03/2016  . 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 BMI 38.0-38.9,adult 02/14/2016  . Polyarthralgia 02/14/2016  . GERD (gastroesophageal reflux disease) 02/10/2014    Allergies  Allergen Reactions  . Codeine Other (See Comments)    Hullacinations  . Hydrocodone-Acetaminophen Other (See Comments)    Cannot urinate on medication  . Lodine [Etodolac] Shortness Of Breath  . Quetiapine Other (See Comments)    Excessive sedation at 50 mg.  . Zanaflex [Tizanidine Hcl]   . Ciprofloxacin Hives  . Escitalopram Other (See Comments)    Intolerant.  . Latex Rash  . Levaquin [  Levofloxacin In D5w] Rash    HIVES  . Sulfa Antibiotics Other (See Comments)    Can't remember reactions    Past Surgical History:  Procedure Laterality Date  . BREAST BIOPSY Right 10/18/2015   bx/clip- neg  . CHOLECYSTECTOMY N/A 07/07/2016   Procedure: LAPAROSCOPIC CHOLECYSTECTOMY;  Surgeon: Olean Ree, MD;  Location: [facility] ORS;  Service: General;  Laterality: N/A;  . COLONOSCOPY WITH PROPOFOL N/A 10/11/2015   Procedure: COLONOSCOPY WITH PROPOFOL;  Surgeon: Lollie Sails, MD;  Location: [facility]  ENDOSCOPY;  Service: Endoscopy;  Laterality: N/A;  . ESOPHAGOGASTRODUODENOSCOPY (EGD) WITH PROPOFOL N/A 10/11/2015   Procedure: ESOPHAGOGASTRODUODENOSCOPY (EGD) WITH PROPOFOL;  Surgeon: Lollie Sails, MD;  Location[facility] END[facility]COPY;  Service: Endoscopy;  Laterality: N/A;  . TONSILLECTOMY    . TOTAL THYROIDECTOMY  2005   goiter    Social History   Tobacco Use  . Smoking status: Former Smoker    Packs/day: 0.50    Years: 7.00    Pack years: 3.50    Types: Cigarettes    Quit date: 2003    Years since quitting: 19.1  . Smokeless tobacco: Never Used  Vaping Use  . Vaping Use: Never used  Substance Use Topics  . Alcohol use: Yes    Comment: Occ.  . Drug use: No     Medication list has been reviewed and updated.  Current Meds  Medication Sig  . albuterol (VENTOLIN HFA) 108 (90 Base) MCG/ACT inhaler Inhale 2 puffs into the lungs every 6 (six) hours as needed for wheezing or shortness of breath.  . Biotin 1 MG CAPS Take by mouth.  . cholecalciferol (VITAMIN D3) 25 MCG (1000 UNIT) tablet Take 1,000 Units by mouth daily.  . clobetasol (TEMOVATE) 0.05 % GEL Apply twice daily  . Fluticasone-Salmeterol (ADVAIR) 100-50 MCG/DOSE AEPB Inhale 1 puff into the lungs 2 (two) times daily.  . hydrocortisone 2.5 % cream   . ibuprofen (ADVIL) 200 MG tablet Take 200 mg by mouth every 6 (six) hours as needed.  Marland Kitchen liothyronine (CYTOMEL) 5 MCG tablet TAKE 2 TABLETS BY MOUTH BEFORE BREAKFAST AND 1 TABLET BEFORE LUNCH (Patient taking differently: Take 5 mcg by mouth daily. 1 tablet daily)  . SYNTHROID 200 MCG tablet Take 1 tablet (200 mcg total) by mouth daily before breakfast. (Patient taking differently: Take 175 mcg by mouth daily before breakfast.)    PHQ 2/9 Scores 10/20/2020 10/11/2020 12/08/2019 09/15/2019  PHQ - 2 Score 0 0 0 0  PHQ- 9 Score 0 6 0 -    GAD 7 : Generalized Anxiety Score 10/20/2020 10/11/2020  Nervous, Anxious, on Edge 0 0  Control/stop worrying 0 0  Worry too much - different  things 0 0  Trouble relaxing 0 0  Restless 0 0  Easily annoyed or irritable 0 0  Afraid - awful might happen 0 0  Total GAD 7 Score 0 0  Anxiety Difficulty Not difficult at all Not difficult at all    BP Readings from Last 3 Encounters:  10/20/20 (!) 122/98  10/11/20 112/78  02/23/20 120/80    Physical Exam Vitals and nursing note reviewed.  Constitutional:      General: She is in acute distress.     Appearance: She is well-developed.  HENT:     Head: Normocephalic and atraumatic.  Cardiovascular:     Heart sounds: Normal heart sounds.   No systolic murmur is present. No friction rub. No gallop.   Pulmonary:     Effort: Pulmonary effort  is normal. No respiratory distress.     Breath sounds: Normal breath sounds. No wheezing or rhonchi.  Musculoskeletal:     Right knee: Swelling present. No effusion. Decreased range of motion. Tenderness present.     Left knee: Swelling and effusion present. Decreased range of motion. Tenderness present.  Skin:    General: Skin is warm and dry.     Findings: No rash.  Neurological:     Mental Status: She is alert and oriented to person, place, and time.  Psychiatric:        Mood and Affect: Mood and affect normal.        Behavior: Behavior normal.        Thought Content: Thought content normal.     Wt Readings from Last 3 Encounters:  10/20/20 226 lb (102.5 kg)  10/11/20 226 lb (102.5 kg)  02/23/20 239 lb 6.4 oz (108.6 kg)    BP (!) 122/98   Pulse 79   Temp (!) 97.5 F (36.4 C) (Oral)   Ht 5\' 4"  (1.626 m)   Wt 226 lb (102.5 kg)   LMP 10/08/2020 (Exact Date)   SpO2 96%   BMI 38.79 kg/m   Assessment and Plan: 1. Chest pain at rest Likely due to stress associated with her worsening knee pain - Normal exam, O2sat and no fever EKG is normal - will get CXR to rule out pulmonary process Pt is reassured - EKG 12-Lead - NSR @ 70, low voltage otherwise WNL - DG Chest 2 View; Future  2. Chronic pain of both knees Seeing Ortho  today - will hopefully get some relief   Partially dictated using Dragon software. Any errors are unintentional.  Halina Maidens, MD [facility]  10/20/2020

## 2020-10-20 NOTE — Telephone Encounter (Signed)
Scheduled patient for today at 1120. Told her if she is unable to make it this morning, then we can double book her for tomorrow at a last resort. She verbalized understanding.

## 2020-10-20 NOTE — Telephone Encounter (Signed)
Pt reports chest "Pressure" onset 3 days ago. States mid chest , rates at 2-3/10. Does not radiate, reports "Mild" nausea. Denies SOB.  reports H/O RA. HAs appt to withdraw fluids from knees today. "I don't know if I might have fluid around my heart too." Pt also called RA physician at [facility]. Pt declines ED. "I have to be dying before I go to ED." States she is downstairs at [employer], [employer]. She is requesting Dr. Army Melia order CXR as she is at work presently. Agent did schedule appt for 10/29/20 prior to sending to triage.  Please advise: [phone]  Reason for Disposition . [1] Chest pain lasts > 5 minutes AND [2] occurred > 3 days ago (72 hours) AND [3] NO chest pain or cardiac symptoms now    Does have pressure presently  Answer Assessment - Initial Assessment Questions 1. LOCATION: "Where does it hurt?"       Mid chest 2. RADIATION: "Does the pain go anywhere else?" (e.g., into neck, jaw, arms, back)     No 3. ONSET: "When did the chest pain begin?" (Minutes, hours or days)      3 days ago 4. PATTERN "Does the pain come and go, or has it been constant since it started?"  "Does it get worse with exertion?"      Constant. No worse with exertion 5. DURATION: "How long does it last" (e.g., seconds, minutes, hours)     Constant 6. SEVERITY: "How bad is the pain?"  (e.g., Scale 1-10; mild, moderate, or severe)    - MILD (1-3): doesn't interfere with normal activities     - MODERATE (4-7): interferes with normal activities or awakens from sleep    - SEVERE (8-10): excruciating pain, unable to do any normal activities       Pressure. 2-3/10 7. CARDIAC RISK FACTORS: "Do you have any history of heart problems or risk factors for heart disease?" (e.g., angina, prior heart attack; diabetes, high blood pressure, high cholesterol, smoker, or strong family history of heart disease)      8. PULMONARY RISK FACTORS: "Do you have any history of lung disease?"  (e.g., blood clots in lung, asthma,  emphysema, birth control pills)      9. CAUSE: "What do you think is causing the chest pain?"     "Maybe fluid around heart" 10. OTHER SYMPTOMS: "Do you have any other symptoms?" (e.g., dizziness, nausea, vomiting, sweating, fever, difficulty breathing, cough)      Nausea Started at same time  Protocols used: CHEST PAIN-A-AH

## 2020-10-21 ENCOUNTER — Encounter: Payer: Self-pay | Admitting: Internal Medicine

## 2020-10-25 ENCOUNTER — Encounter: Payer: Self-pay | Admitting: Internal Medicine

## 2020-10-25 ENCOUNTER — Other Ambulatory Visit: Payer: Self-pay

## 2020-10-25 DIAGNOSIS — M459 Ankylosing spondylitis of unspecified sites in spine: Secondary | ICD-10-CM

## 2020-10-25 DIAGNOSIS — M2550 Pain in unspecified joint: Secondary | ICD-10-CM

## 2020-10-29 ENCOUNTER — Ambulatory Visit: Payer: 59 | Admitting: Internal Medicine

## 2020-11-01 ENCOUNTER — Other Ambulatory Visit: Payer: Self-pay | Admitting: Student

## 2020-11-01 DIAGNOSIS — R350 Frequency of micturition: Secondary | ICD-10-CM | POA: Diagnosis not present

## 2020-11-01 DIAGNOSIS — R399 Unspecified symptoms and signs involving the genitourinary system: Secondary | ICD-10-CM | POA: Diagnosis not present

## 2020-11-01 DIAGNOSIS — N760 Acute vaginitis: Secondary | ICD-10-CM | POA: Diagnosis not present

## 2020-11-11 NOTE — Progress Notes (Signed)
11/12/2020 4:34 PM   Hailey [name] 25-Jul-1976 [id number]  Referring provider: Glean Hess, MD [address] [place],  [place] [address]  Chief Complaint  Patient presents with  . Recurrent UTI   Urological history: 1. Interstitial cystitis - cystoscopy 2013 - NED - urine cytology negative 2018 - failed OAB medications - failed Elmiron - some success with amitriptyline and rescue solutions  2. High risk hematuria - former smoker - CTU and cysto 2013 - NED - urine cytology negative 2018 - Contrast CT 2019 - NED - UA negative for micro heme   HPI: Hailey [name] is a 45 year old female with IC and urinary frequency who presents today with urinary complaints.    Over the Christmas holiday, she experienced pelvic pain associated with chills and discomfort with urination.  Her urine culture grew out GSB.  She was given Omnicef 300 mg twice daily for 7 days and a Diflucan.    She states her symptoms worsened and she went back to urgent care in February.  She was having urinary frequency and nocturia.  She states 1 night she urinated 33 times.  She also had some mild dysuria.  She was given a prescription for Augmentin 875/125, twice daily for 7 days.  Urine culture grew out mixed urogenital flora.  She continues to experience symptoms of frequency, painful urination and urge incontinence.  She also is experiencing dyspareunia.  Patient denies any modifying or aggravating factors.  Patient denies any gross hematuria or flank pain.  Patient denies any fevers, chills, nausea or vomiting.   UA benign.  PVR 30 mL.   She is not finding relief of her symptoms with amitriptyline 25 mg daily.  PMH: Past Medical History:  Diagnosis Date  . Allergic state   . Allergy   . Arthritis   . Asthma   . Cervical disc disease   . Depression   . GERD (gastroesophageal reflux disease)   . H/O colonoscopy   . HPV (human papilloma virus) infection   . Hypothyroidism   .  Interstitial cystitis   . LGSIL (low grade squamous intraepithelial dysplasia)   . Paresthesia   . Status post laparoscopic cholecystectomy 07/13/2016    Surgical History: Past Surgical History:  Procedure Laterality Date  . BREAST BIOPSY Right 10/18/2015   bx/clip- neg  . CHOLECYSTECTOMY N/A 07/07/2016   Procedure: LAPAROSCOPIC CHOLECYSTECTOMY;  Surgeon: Olean Ree, MD;  Location: [facility] ORS;  Service: General;  Laterality: N/A;  . COLONOSCOPY WITH PROPOFOL N/A 10/11/2015   Procedure: COLONOSCOPY WITH PROPOFOL;  Surgeon: Lollie Sails, MD;  Location: [facility] ENDOSCOPY;  Service: Endoscopy;  Laterality: N/A;  . ESOPHAGOGASTRODUODENOSCOPY (EGD) WITH PROPOFOL N/A 10/11/2015   Procedure: ESOPHAGOGASTRODUODENOSCOPY (EGD) WITH PROPOFOL;  Surgeon: Lollie Sails, MD;  Location[facility] ENDOSCOPY;  Service: Endoscopy;  Laterality: N/A;  . TONSILLECTOMY    . TOTAL THYROIDECTOMY  2005   goiter    Home Medications:  Allergies as of 11/12/2020      Reactions   Codeine Other (See Comments)   Hullacinations   Hydrocodone-acetaminophen Other (See Comments)   Cannot urinate on medication   Lodine [etodolac] Shortness Of Breath   Quetiapine Other (See Comments)   Excessive sedation at 50 mg.   Zanaflex [tizanidine Hcl]    Ciprofloxacin Hives   Escitalopram Other (See Comments)   Intolerant.   Latex Rash   Levaquin [levofloxacin In D5w] Rash   HIVES   Sulfa Antibiotics Other (See Comments)  Can't remember reactions      Medication List       Accurate as of November 12, 2020 11:59 PM. If you have any questions, ask your nurse or doctor.        albuterol 108 (90 Base) MCG/ACT inhaler Commonly known as: VENTOLIN HFA Inhale 2 puffs into the lungs every 6 (six) hours as needed for wheezing or shortness of breath.   Biotin 1 MG Caps Take by mouth.   cholecalciferol 25 MCG (1000 UNIT) tablet Commonly known as: VITAMIN D3 Take 1,000 Units by mouth daily.   clobetasol 0.05 % Gel Commonly  known as: TEMOVATE Apply twice daily   Fluticasone-Salmeterol 100-50 MCG/DOSE Aepb Commonly known as: ADVAIR Inhale 1 puff into the lungs 2 (two) times daily.   gabapentin 300 MG capsule Commonly known as: Neurontin Take 1 capsule (300 mg total) by mouth at bedtime. Started by: Zara Council, PA-C   hydrocortisone 2.5 % cream   ibuprofen 200 MG tablet Commonly known as: ADVIL Take 200 mg by mouth every 6 (six) hours as needed.   liothyronine 5 MCG tablet Commonly known as: CYTOMEL TAKE 2 TABLETS BY MOUTH BEFORE BREAKFAST AND 1 TABLET BEFORE LUNCH What changed:   how much to take  how to take this  when to take this  additional instructions   oxybutynin 5 MG tablet Commonly known as: DITROPAN Take 1 tablet (5 mg total) by mouth 3 (three) times daily. Started by: Zara Council, PA-C   Synthroid 200 MCG tablet Generic drug: levothyroxine Take 1 tablet (200 mcg total) by mouth daily before breakfast. What changed: how much to take       Allergies:  Allergies  Allergen Reactions  . Codeine Other (See Comments)    Hullacinations  . Hydrocodone-Acetaminophen Other (See Comments)    Cannot urinate on medication  . Lodine [Etodolac] Shortness Of Breath  . Quetiapine Other (See Comments)    Excessive sedation at 50 mg.  . Zanaflex [Tizanidine Hcl]   . Ciprofloxacin Hives  . Escitalopram Other (See Comments)    Intolerant.  . Latex Rash  . Levaquin [Levofloxacin In D5w] Rash    HIVES  . Sulfa Antibiotics Other (See Comments)    Can't remember reactions    Family History: Family History  Problem Relation Age of Onset  . Hypertension Mother   . Stroke Mother   . Cancer Mother   . Goiter Mother   . Diabetes Father   . Hypertension Father   . Hypertension Brother   . Hypertension Maternal Grandmother   . Stroke Maternal Grandmother   . Diabetes Maternal Grandmother   . Breast cancer Cousin 29       pat cousin    Social History:  reports that she  quit smoking about 19 years ago. Her smoking use included cigarettes. She has a 3.50 pack-year smoking history. She has never used smokeless tobacco. She reports current alcohol use. She reports that she does not use drugs.  ROS For pertinent review of systems please refer to history of present illness  Physical Exam: BP 127/81   Pulse 66   Ht 5\' 4"  (1.626 m)   Wt 226 lb (102.5 kg)   BMI 38.79 kg/m   Constitutional:  Well nourished. Alert and oriented, No acute distress. HEENT: [place] AT, mask in place.  Trachea midline Cardiovascular: No clubbing, cyanosis, or edema. Respiratory: Normal respiratory effort, no increased work of breathing. Neurologic: Grossly intact, no focal deficits, moving all 4 extremities. Psychiatric:  Normal mood and affect.   Laboratory Data: Lab Results  Component Value Date   WBC 12.0 (H) 02/05/2020   HGB 13.4 02/05/2020   HCT 40.7 02/05/2020   MCV 88.1 02/05/2020   PLT 445 (H) 02/05/2020    Lab Results  Component Value Date   CREATININE 0.67 02/05/2020    Lab Results  Component Value Date   TSH 7.288 (H) 02/20/2020     Lab Results  Component Value Date   AST 21 12/05/2019   Lab Results  Component Value Date   ALT 22 12/05/2019   Specimen:  Genital  Ref Range & Units 10 d ago  Clue Cells, Vaginal None Seen None Seen   WBC ([place] Blood Cells), Vaginal None Seen FewAbnormal   Trichomonas, Vaginal None Seen None Seen   Bacteria, Vaginal None Seen FewAbnormal   Yeast, Vaginal None Seen None Seen   Resulting Agency  [place] - LAB   Narrative Performed by [facility] - LAB [place] negative  Specimen Collected: 11/01/20 11:51 AM Last Resulted: 11/01/20 12:00 PM  Received From: [place]  Result Received: 11/08/20 9:04 AM   Specimen:  Urine - Urinary bladder structure (body structure) Component 10 d ago Comments  Urine Culture, Routine - [facility]  Final report    Result 1 - [facility]  Comment   Mixed urogenital flora  10,000-25,000 colony forming units per mL  [place]    Narrative Performed by Franchot Mimes Performed at: [facility]  [address], [place], [place] [id number]  Lab Director: Rush Farmer MD, Phone: [phone] Specimen Collected: 11/01/20 11:51 AM Last Resulted: 11/03/20 1:36 PM  Received From: [place]  Result Received: 11/08/20 9:04 AM   Specimen:  Urine  Ref Range & Units 10 d ago  Color Yellow, Violet, Light Violet, Dark Violet Yellow   Clarity Clear Clear   Specific Gravity 1.000 - 1.030 <=1.005   pH, Urine 5.0 - 8.0 6.0   Protein, Urinalysis Negative, Trace mg/dL Negative   Glucose, Urinalysis Negative mg/dL Negative   Ketones, Urinalysis Negative mg/dL Negative   Blood, Urinalysis Negative Negative   Nitrite, Urinalysis Negative Negative   Leukocyte Esterase, Urinalysis Negative Negative   [place] Blood Cells, Urinalysis None Seen, 0-3 /hpf 0-3   Red Blood Cells, Urinalysis None Seen, 0-3 /hpf None Seen   Bacteria, Urinalysis None Seen /hpf None Seen   Squamous Epithelial Cells, Urinalysis Rare, Few, None Seen /hpf Rare   Resulting Agency  [place] - LAB  Specimen Collected: 11/01/20 10:29 AM Last Resulted: 11/01/20 10:53 AM  Received From: [facility]  Result Received: 11/08/20 9:04 AM  I have reviewed the labs.   Pertinent Imaging: Results for CALEE, NUGENT (MRN [id number]) as of 12/02/2020 16:25  Ref. Range 11/12/2020 10:24  Scan Result Unknown 30     Assessment & Plan:    1. IC -We will have a trial of gabapentin 300 mg 1 capsule to take at night to see if it will ease her nighttime urinary symptoms -She will continue the oxybutynin 5 mg tablets -We also discussed that it has been several years since she has undergone a cystoscopy for her symptoms and if her symptoms persist, it would be worthwhile to have another look inside her bladder.   She states that she would like to give the medicine more time and we will revisit this when she returns in 6 weeks  Return in about 6 weeks (around 12/24/2020).  These notes generated with voice recognition software. I apologize for typographical errors.  Zara Council, PA-C  [facility] [address]  [place], [place] [address]  I spent  minutes on the day of the encounter to include pre-visit record review, face-to-face time with the patient, and post-visit ordering of tests. [phone]

## 2020-11-12 ENCOUNTER — Other Ambulatory Visit: Payer: Self-pay | Admitting: Urology

## 2020-11-12 ENCOUNTER — Other Ambulatory Visit: Payer: Self-pay

## 2020-11-12 ENCOUNTER — Ambulatory Visit (INDEPENDENT_AMBULATORY_CARE_PROVIDER_SITE_OTHER): Payer: 59 | Admitting: Urology

## 2020-11-12 ENCOUNTER — Encounter: Payer: Self-pay | Admitting: Urology

## 2020-11-12 VITALS — BP 127/81 | HR 66 | Ht 64.0 in | Wt 226.0 lb

## 2020-11-12 DIAGNOSIS — N3010 Interstitial cystitis (chronic) without hematuria: Secondary | ICD-10-CM | POA: Diagnosis not present

## 2020-11-12 LAB — URINALYSIS, COMPLETE
Bilirubin, UA: NEGATIVE
Glucose, UA: NEGATIVE
Ketones, UA: NEGATIVE
Leukocytes,UA: NEGATIVE
Nitrite, UA: NEGATIVE
Protein,UA: NEGATIVE
RBC, UA: NEGATIVE
Specific Gravity, UA: 1.015 (ref 1.005–1.030)
Urobilinogen, Ur: 0.2 mg/dL (ref 0.2–1.0)
pH, UA: 6.5 (ref 5.0–7.5)

## 2020-11-12 LAB — MICROSCOPIC EXAMINATION
Bacteria, UA: NONE SEEN
WBC, UA: NONE SEEN /hpf (ref 0–5)

## 2020-11-12 LAB — BLADDER SCAN AMB NON-IMAGING: Scan Result: 30

## 2020-11-12 MED ORDER — OXYBUTYNIN CHLORIDE 5 MG PO TABS: 5.0000 mg | ORAL_TABLET | Freq: Three times a day (TID) | ORAL | 3 refills | Status: DC

## 2020-11-12 MED ORDER — GABAPENTIN 300 MG PO CAPS: 300.0000 mg | ORAL_CAPSULE | Freq: Every day | ORAL | 3 refills | Status: DC

## 2020-11-22 DIAGNOSIS — M25361 Other instability, right knee: Secondary | ICD-10-CM | POA: Diagnosis not present

## 2020-11-22 DIAGNOSIS — M25362 Other instability, left knee: Secondary | ICD-10-CM | POA: Diagnosis not present

## 2020-11-22 DIAGNOSIS — M25562 Pain in left knee: Secondary | ICD-10-CM | POA: Diagnosis not present

## 2020-11-22 DIAGNOSIS — M170 Bilateral primary osteoarthritis of knee: Secondary | ICD-10-CM | POA: Diagnosis not present

## 2020-11-22 DIAGNOSIS — M25561 Pain in right knee: Secondary | ICD-10-CM | POA: Diagnosis not present

## 2020-11-22 DIAGNOSIS — M21162 Varus deformity, not elsewhere classified, left knee: Secondary | ICD-10-CM | POA: Diagnosis not present

## 2020-11-22 DIAGNOSIS — M21161 Varus deformity, not elsewhere classified, right knee: Secondary | ICD-10-CM | POA: Diagnosis not present

## 2020-11-26 ENCOUNTER — Other Ambulatory Visit: Payer: Self-pay | Admitting: Rheumatology

## 2020-11-26 DIAGNOSIS — M170 Bilateral primary osteoarthritis of knee: Secondary | ICD-10-CM | POA: Diagnosis not present

## 2020-11-26 DIAGNOSIS — Z79899 Other long term (current) drug therapy: Secondary | ICD-10-CM | POA: Diagnosis not present

## 2020-11-26 DIAGNOSIS — Z111 Encounter for screening for respiratory tuberculosis: Secondary | ICD-10-CM | POA: Diagnosis not present

## 2020-11-26 DIAGNOSIS — M2550 Pain in unspecified joint: Secondary | ICD-10-CM | POA: Diagnosis not present

## 2020-11-26 DIAGNOSIS — M791 Myalgia, unspecified site: Secondary | ICD-10-CM | POA: Diagnosis not present

## 2020-11-26 DIAGNOSIS — I7300 Raynaud's syndrome without gangrene: Secondary | ICD-10-CM | POA: Diagnosis not present

## 2020-11-26 DIAGNOSIS — M457 Ankylosing spondylitis of lumbosacral region: Secondary | ICD-10-CM | POA: Diagnosis not present

## 2020-11-29 ENCOUNTER — Other Ambulatory Visit: Payer: Self-pay | Admitting: Rheumatology

## 2020-11-29 DIAGNOSIS — M457 Ankylosing spondylitis of lumbosacral region: Secondary | ICD-10-CM

## 2020-12-10 ENCOUNTER — Ambulatory Visit: Payer: 59

## 2020-12-17 ENCOUNTER — Other Ambulatory Visit: Payer: Self-pay

## 2020-12-17 ENCOUNTER — Ambulatory Visit
Admission: RE | Admit: 2020-12-17 | Discharge: 2020-12-17 | Disposition: A | Discharge status: Home / Self Care | Payer: 59 | Source: Ambulatory Visit | Attending: Rheumatology | Admitting: Rheumatology

## 2020-12-17 DIAGNOSIS — M457 Ankylosing spondylitis of lumbosacral region: Secondary | ICD-10-CM | POA: Diagnosis not present

## 2020-12-17 DIAGNOSIS — M25552 Pain in left hip: Secondary | ICD-10-CM | POA: Diagnosis not present

## 2020-12-17 DIAGNOSIS — M25551 Pain in right hip: Secondary | ICD-10-CM | POA: Diagnosis not present

## 2020-12-17 DIAGNOSIS — D251 Intramural leiomyoma of uterus: Secondary | ICD-10-CM | POA: Diagnosis not present

## 2020-12-17 DIAGNOSIS — M533 Sacrococcygeal disorders, not elsewhere classified: Secondary | ICD-10-CM | POA: Diagnosis not present

## 2020-12-24 ENCOUNTER — Ambulatory Visit: Payer: Self-pay | Admitting: Urology

## 2021-01-07 ENCOUNTER — Ambulatory Visit: Payer: Self-pay | Admitting: Urology

## 2021-01-13 NOTE — Progress Notes (Signed)
01/14/2021 10:23 AM   Hailey [name] [birth year] [id number]  Referring provider: Glean Hess, MD [address] [place],  [place] [address]  Chief Complaint  Patient presents with  . Follow-up   Urological history: 1. Interstitial cystitis - cystoscopy 2013 - NED - urine cytology negative 2018 - failed OAB medications - failed Elmiron - some success with amitriptyline and rescue solutions  2. High risk hematuria - former smoker - CTU and cysto 2013 - NED - urine cytology negative 2018 - Contrast CT 2019 - NED - UA negative for micro heme   HPI: Hailey [name] is a 45 year old female with IC and urinary frequency who presents today for follow-up after starting gabapentin for urethral pain.  She is having intense bilateral leg pain which keeps her up at night.  She does drink copious amount of water and effort to stay healthy.  She states the gabapentin has eased the urethral pain.  Patient denies any modifying or aggravating factors.  Patient denies any gross hematuria, dysuria or suprapubic/flank pain.  Patient denies any fevers, chills, nausea or vomiting.   She is very concerned that she may have a life-threatening illness causing the bilateral leg pain.  Pelvic MRI performed for pelvic pain on December 17, 2020 did not demonstrate any urological abnormalities.   PMH: Past Medical History:  Diagnosis Date  . Allergic state   . Allergy   . Arthritis   . Asthma   . Cervical disc disease   . Depression   . GERD (gastroesophageal reflux disease)   . H/O colonoscopy   . HPV (human papilloma virus) infection   . Hypothyroidism   . Interstitial cystitis   . LGSIL (low grade squamous intraepithelial dysplasia)   . Paresthesia   . Status post laparoscopic cholecystectomy 07/13/2016    Surgical History: Past Surgical History:  Procedure Laterality Date  . BREAST BIOPSY Right 10/18/2015   bx/clip- neg  . CHOLECYSTECTOMY N/A 07/07/2016   Procedure:  LAPAROSCOPIC CHOLECYSTECTOMY;  Surgeon: Olean Ree, MD;  Location: [facility] ORS;  Service: General;  Laterality: N/A;  . COLONOSCOPY WITH PROPOFOL N/A 10/11/2015   Procedure: COLONOSCOPY WITH PROPOFOL;  Surgeon: Lollie Sails, MD;  Location: [facility] ENDOSCOPY;  Service: Endoscopy;  Laterality: N/A;  . ESOPHAGOGASTRODUODENOSCOPY (EGD) WITH PROPOFOL N/A 10/11/2015   Procedure: ESOPHAGOGASTRODUODENOSCOPY (EGD) WITH PROPOFOL;  Surgeon: Lollie Sails, MD;  Location: [facility] ENDOSCOPY;  Service: Endoscopy;  Laterality: N/A;  . TONSILLECTOMY    . TOTAL THYROIDECTOMY  2005   goiter    Home Medications:  Allergies as of 01/14/2021      Reactions   Codeine Other (See Comments)   Hullacinations   Hydrocodone-acetaminophen Other (See Comments)   Cannot urinate on medication   Lodine [etodolac] Shortness Of Breath   Quetiapine Other (See Comments)   Excessive sedation at 50 mg.   Zanaflex [tizanidine Hcl]    Ciprofloxacin Hives   Escitalopram Other (See Comments)   Intolerant.   Latex Rash   Levaquin [levofloxacin In D5w] Rash   HIVES   Sulfa Antibiotics Other (See Comments)   Can't remember reactions      Medication List       Accurate as of Jan 14, 2021 10:23 AM. If you have any questions, ask your nurse or doctor.        STOP taking these medications   amoxicillin-clavulanate 875-125 MG tablet Commonly known as: AUGMENTIN Stopped by: Merrik Puebla, PA-C   clindamycin 300 MG  capsule Commonly known as: CLEOCIN Stopped by: Chisom Muntean, PA-C   cyclobenzaprine 5 MG tablet Commonly known as: FLEXERIL Stopped by: Justis Dupas, PA-C     TAKE these medications   acetaminophen-codeine 300-30 MG tablet Commonly known as: TYLENOL #3 TAKE 1-2 TABLETS BY MOUTH EVERY 4 HOURS AS NEEDED FOR DENTAL PAIN. DO NOT EXCEED 11 IN 24 HOURS   albuterol 108 (90 Base) MCG/ACT inhaler Commonly known as: VENTOLIN HFA Inhale 2 puffs into the lungs every 6 (six) hours as needed for wheezing or  shortness of breath.   ampicillin 500 MG capsule Commonly known as: PRINCIPEN TAKE 1 CAPSULE BY MOUTH 4 (FOUR) TIMES DAILY FOR 7 DAYS   Biotin 1 MG Caps Take by mouth.   cholecalciferol 25 MCG (1000 UNIT) tablet Commonly known as: VITAMIN D3 Take 1,000 Units by mouth daily.   clobetasol 0.05 % Gel Commonly known as: TEMOVATE Apply twice daily   Fluticasone-Salmeterol 100-50 MCG/DOSE Aepb Commonly known as: ADVAIR Inhale 1 puff into the lungs 2 (two) times daily.   folic acid 1 MG tablet Commonly known as: FOLVITE TAKE 1 TABLET (1 MG TOTAL) BY MOUTH ONCE DAILY   gabapentin 300 MG capsule Commonly known as: NEURONTIN TAKE 1 CAPSULE BY MOUTH AT BEDTIME.   hydrocortisone 2.5 % cream   ibuprofen 200 MG tablet Commonly known as: ADVIL Take 200 mg by mouth every 6 (six) hours as needed.   liothyronine 5 MCG tablet Commonly known as: CYTOMEL TAKE 2 TABLETS BY MOUTH BEFORE BREAKFAST AND 1 TABLET BEFORE LUNCH What changed:   how much to take  how to take this  when to take this  additional instructions   liothyronine 5 MCG tablet Commonly known as: CYTOMEL TAKE 1 TABLET BY MOUTH ONCE DAILY What changed: Another medication with the same name was changed. Make sure you understand how and when to take each.   methotrexate 2.5 MG tablet TAKE 8 TABLETS (20 MG TOTAL) BY MOUTH EVERY 7 (SEVEN) DAYS   methotrexate 2.5 MG tablet TAKE 10 TABLETS BY MOUTH EVERY 7 DAYS IN SPLIT DOSE (5 TABLETS IN THE MORNING AND 5 TABLETS IN THE EVENING ONE DAY OF THE WEEK)   nabumetone 500 MG tablet Commonly known as: RELAFEN TAKE 1 TABLET BY MOUTH 2 (TWO) TIMES DAILY BEFORE MEALS   oxybutynin 5 MG tablet Commonly known as: DITROPAN TAKE 1 TABLET BY MOUTH 3 TIMES DAILY   predniSONE 10 MG tablet Commonly known as: DELTASONE TAKE 20 MG (2 TABS) DAILY X 1 WEEK, THEN TAKE 10 MG (1 TAB) DAILY X 1 WEEK, THEN STOP.   sulfaSALAzine 500 MG EC tablet Commonly known as: AZULFIDINE TAKE 1  TABLET BY MOUTH ONCE DAILY FOR 7 DAYS, THEN 1 TABLET TWO TIMES DAILY FOR 7 DAYS, THEN 2 TABLETS BY MOUTH 2 TIMES DAILY FOR 7 DAYS   Synthroid 200 MCG tablet Generic drug: levothyroxine Take 1 tablet (200 mcg total) by mouth daily before breakfast. What changed: how much to take   Synthroid 175 MCG tablet Generic drug: levothyroxine TAKE 1 TABLET BY MOUTH ONCE DAILY What changed: Another medication with the same name was changed. Make sure you understand how and when to take each.       Allergies:  Allergies  Allergen Reactions  . Codeine Other (See Comments)    Hullacinations  . Hydrocodone-Acetaminophen Other (See Comments)    Cannot urinate on medication  . Lodine [Etodolac] Shortness Of Breath  . Quetiapine Other (See Comments)    Excessive  sedation at 50 mg.  . Zanaflex [Tizanidine Hcl]   . Ciprofloxacin Hives  . Escitalopram Other (See Comments)    Intolerant.  . Latex Rash  . Levaquin [Levofloxacin In D5w] Rash    HIVES  . Sulfa Antibiotics Other (See Comments)    Can't remember reactions    Family History: Family History  Problem Relation Age of Onset  . Hypertension Mother   . Stroke Mother   . Cancer Mother   . Goiter Mother   . Diabetes Father   . Hypertension Father   . Hypertension Brother   . Hypertension Maternal Grandmother   . Stroke Maternal Grandmother   . Diabetes Maternal Grandmother   . Breast cancer Cousin 13       pat cousin    Social History:  reports that she quit smoking about 19 years ago. Her smoking use included cigarettes. She has a 3.50 pack-year smoking history. She has never used smokeless tobacco. She reports current alcohol use. She reports that she does not use drugs.  ROS For pertinent review of systems please refer to history of present illness  Physical Exam: BP 120/84   Pulse 65   Ht 5\' 5"  (1.651 m)   Wt 218 lb (98.9 kg)   BMI 36.28 kg/m   Constitutional:  Well nourished. Alert and oriented, No acute  distress. HEENT: [place] AT, mask in place.  Trachea midline Cardiovascular: No clubbing, cyanosis, or edema. Respiratory: Normal respiratory effort, no increased work of breathing. Neurologic: Grossly intact, no focal deficits, moving all 4 extremities. Psychiatric: Normal mood and affect.   Laboratory Data: No new labs since last visit I have reviewed the labs.   Pertinent Imaging: CLINICAL DATA:  Chronic pelvic and bilateral hip pain.  EXAM: MRI PELVIS WITHOUT CONTRAST  TECHNIQUE: Multiplanar multisequence MR imaging of the pelvis was performed. No intravenous contrast was administered.  COMPARISON:  CT abdomen pelvis dated April 12, 2018.  FINDINGS: Bones: There is no evidence of acute fracture, dislocation or avascular necrosis. No focal bone lesion. Chronic left greater than right sacroiliac joint periarticular sclerosis.  Articular cartilage and labrum  Articular cartilage: No focal chondral defect or subchondral signal abnormality identified.  Labrum: Grossly intact, although evaluation is limited due to lack of intra-articular fluid. No paralabral abnormality.  Joint or bursal effusion  Joint effusion: No significant hip joint effusion.  Bursae: No focal periarticular fluid collection.  Muscles and tendons  Muscles and tendons: The visualized gluteus, hamstring and iliopsoas tendons appear normal. No muscle edema or atrophy. 1.7 cm oval well-defined T2 hyperintense, T1 isointense lesion in the left quadratus femoris muscle (series 11, image 41).  Other findings  Miscellaneous: 1.4 cm intramural fibroid in the anterior uterine body. 3.0 cm simple appearing cyst in the right ovary. Dominant follicle in the left ovary. Trace free fluid in the pelvis, likely physiologic.  IMPRESSION: 1. No acute abnormality. 2. 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 3. 1.7 cm oval well-defined T2 hyperintense, T1 isointense lesion in the  left quadratus femoris muscle, indeterminate. Differential considerations include a ganglion cyst, intramuscular myxoma, or peripheral nerve sheath tumor. Recommend follow-up MRI pelvis with and without contrast in 3-6 months to evaluate for interval change. 4. 3.0 cm simple appearing cyst in the right ovary. No follow-up imaging recommended. Note: This recommendation does not apply to premenarchal patients and to those with increased risk (genetic, family history, elevated tumor markers or other high-risk factors) of ovarian cancer. Reference: JACR 2020 Feb; 17(2):248-254  Electronically Signed   By: Titus Dubin M.D.   On: 12/18/2020 16:48 I have independently reviewed the films.  See HPI.     Assessment & Plan:    1. IC -She will continue the gabapentin 300 mg nightly  2.  Bilateral leg pain -She is very concerned that this bilateral leg pain is being caused by something very serious and asked if I would draw some labs for her -CMP -Lactic acid -CBC  Return for pending labs.  These notes generated with voice recognition software. I apologize for typographical errors.  Zara Council, PA-C  [facility] [address]  [place], [place] [address] [phone]   I spent 15 minutes on the day of the encounter to include pre-visit record review, face-to-face time with the patient, and post-visit ordering of tests.

## 2021-01-14 ENCOUNTER — Ambulatory Visit (INDEPENDENT_AMBULATORY_CARE_PROVIDER_SITE_OTHER): Payer: 59 | Admitting: Urology

## 2021-01-14 ENCOUNTER — Encounter: Payer: Self-pay | Admitting: Urology

## 2021-01-14 ENCOUNTER — Other Ambulatory Visit: Payer: Self-pay

## 2021-01-14 ENCOUNTER — Other Ambulatory Visit
Admission: RE | Admit: 2021-01-14 | Discharge: 2021-01-14 | Disposition: A | Discharge status: Home / Self Care | Payer: 59 | Source: Ambulatory Visit | Attending: Urology | Admitting: Urology

## 2021-01-14 VITALS — BP 120/84 | HR 65 | Ht 65.0 in | Wt 218.0 lb

## 2021-01-14 DIAGNOSIS — M79604 Pain in right leg: Secondary | ICD-10-CM

## 2021-01-14 DIAGNOSIS — M79605 Pain in left leg: Secondary | ICD-10-CM | POA: Diagnosis not present

## 2021-01-14 DIAGNOSIS — N3010 Interstitial cystitis (chronic) without hematuria: Secondary | ICD-10-CM

## 2021-01-14 LAB — CBC WITH DIFFERENTIAL/PLATELET
Abs Immature Granulocytes: 0.01 10*3/uL (ref 0.00–0.07)
Basophils Absolute: 0.1 10*3/uL (ref 0.0–0.1)
Basophils Relative: 1 %
Eosinophils Absolute: 0.2 10*3/uL (ref 0.0–0.5)
Eosinophils Relative: 3 %
HCT: 39.5 % (ref 36.0–46.0)
Hemoglobin: 13.7 g/dL (ref 12.0–15.0)
Immature Granulocytes: 0 %
Lymphocytes Relative: 40 %
Lymphs Abs: 2.9 10*3/uL (ref 0.7–4.0)
MCH: 29.9 pg (ref 26.0–34.0)
MCHC: 34.7 g/dL (ref 30.0–36.0)
MCV: 86.2 fL (ref 80.0–100.0)
Monocytes Absolute: 0.6 10*3/uL (ref 0.1–1.0)
Monocytes Relative: 9 %
Neutro Abs: 3.4 10*3/uL (ref 1.7–7.7)
Neutrophils Relative %: 47 %
Platelets: 353 10*3/uL (ref 150–400)
RBC: 4.58 MIL/uL (ref 3.87–5.11)
RDW: 13.6 % (ref 11.5–15.5)
WBC: 7.3 10*3/uL (ref 4.0–10.5)
nRBC: 0 % (ref 0.0–0.2)

## 2021-01-14 LAB — COMPREHENSIVE METABOLIC PANEL
ALT: 15 U/L (ref 0–44)
AST: 18 U/L (ref 15–41)
Albumin: 4.2 g/dL (ref 3.5–5.0)
Alkaline Phosphatase: 86 U/L (ref 38–126)
Anion gap: 8 (ref 5–15)
BUN: 12 mg/dL (ref 6–20)
CO2: 24 mmol/L (ref 22–32)
Calcium: 9.1 mg/dL (ref 8.9–10.3)
Chloride: 105 mmol/L (ref 98–111)
Creatinine, Ser: 0.68 mg/dL (ref 0.44–1.00)
GFR, Estimated: >60 mL/min (ref >60)
Glucose, Bld: 105 mg/dL — ABNORMAL HIGH (ref 70–99)
Potassium: 4.6 mmol/L (ref 3.5–5.1)
Sodium: 137 mmol/L (ref 135–145)
Total Bilirubin: 0.7 mg/dL (ref 0.3–1.2)
Total Protein: 7.5 g/dL (ref 6.5–8.1)

## 2021-01-14 LAB — LACTIC ACID, PLASMA: Lactic Acid, Venous: 0.8 mmol/L (ref 0.5–1.9)

## 2021-01-14 MED FILL — Levothyroxine Sodium Tab 175 MCG: ORAL | 90 days supply | Qty: 90 | Fill #0 | Status: AC

## 2021-01-24 ENCOUNTER — Encounter: Payer: Self-pay | Admitting: Internal Medicine

## 2021-01-25 ENCOUNTER — Other Ambulatory Visit: Payer: Self-pay

## 2021-01-25 DIAGNOSIS — Z114 Encounter for screening for human immunodeficiency virus [HIV]: Secondary | ICD-10-CM | POA: Diagnosis not present

## 2021-01-26 LAB — HIV ANTIBODY (ROUTINE TESTING W REFLEX): HIV Screen 4th Generation wRfx: NONREACTIVE

## 2021-02-01 DIAGNOSIS — M457 Ankylosing spondylitis of lumbosacral region: Secondary | ICD-10-CM | POA: Diagnosis not present

## 2021-02-14 DIAGNOSIS — M457 Ankylosing spondylitis of lumbosacral region: Secondary | ICD-10-CM | POA: Diagnosis not present

## 2021-02-28 DIAGNOSIS — E890 Postprocedural hypothyroidism: Secondary | ICD-10-CM | POA: Diagnosis not present

## 2021-03-07 ENCOUNTER — Other Ambulatory Visit: Payer: Self-pay

## 2021-03-07 DIAGNOSIS — E890 Postprocedural hypothyroidism: Secondary | ICD-10-CM | POA: Diagnosis not present

## 2021-03-07 DIAGNOSIS — R3589 Other polyuria: Secondary | ICD-10-CM | POA: Diagnosis not present

## 2021-03-07 DIAGNOSIS — R350 Frequency of micturition: Secondary | ICD-10-CM | POA: Diagnosis not present

## 2021-03-07 DIAGNOSIS — Z833 Family history of diabetes mellitus: Secondary | ICD-10-CM | POA: Diagnosis not present

## 2021-03-07 MED ORDER — LEVOTHYROXINE SODIUM 175 MCG PO TABS
ORAL_TABLET | ORAL | 1 refills | Status: DC
  Filled 2021-03-07 – 2021-04-04 (×3): qty 90, 90d supply, fill #0
  Filled 2021-08-22: qty 90, 90d supply, fill #1

## 2021-03-07 MED ORDER — LIOTHYRONINE SODIUM 5 MCG PO TABS
ORAL_TABLET | ORAL | 1 refills | Status: DC
  Filled 2021-03-07 – 2021-04-04 (×2): qty 90, 90d supply, fill #0
  Filled 2021-08-22: qty 90, 90d supply, fill #1

## 2021-03-21 ENCOUNTER — Other Ambulatory Visit: Payer: Self-pay

## 2021-03-21 DIAGNOSIS — M457 Ankylosing spondylitis of lumbosacral region: Secondary | ICD-10-CM | POA: Diagnosis not present

## 2021-04-04 ENCOUNTER — Other Ambulatory Visit: Payer: Self-pay

## 2021-04-29 ENCOUNTER — Other Ambulatory Visit: Payer: Self-pay

## 2021-04-29 DIAGNOSIS — M457 Ankylosing spondylitis of lumbosacral region: Secondary | ICD-10-CM | POA: Diagnosis not present

## 2021-04-29 DIAGNOSIS — H7012 Chronic mastoiditis, left ear: Secondary | ICD-10-CM | POA: Diagnosis not present

## 2021-04-29 DIAGNOSIS — M170 Bilateral primary osteoarthritis of knee: Secondary | ICD-10-CM | POA: Diagnosis not present

## 2021-04-29 DIAGNOSIS — Z79899 Other long term (current) drug therapy: Secondary | ICD-10-CM | POA: Diagnosis not present

## 2021-04-29 MED ORDER — SULFASALAZINE 500 MG PO TBEC
500.0000 mg | DELAYED_RELEASE_TABLET | Freq: Two times a day (BID) | ORAL | 3 refills | Status: DC
  Filled 2021-04-29: qty 60, 30d supply, fill #0

## 2021-04-29 MED ORDER — NABUMETONE 500 MG PO TABS
ORAL_TABLET | ORAL | 5 refills | Status: DC
  Filled 2021-04-29: qty 60, 30d supply, fill #0

## 2021-05-03 ENCOUNTER — Other Ambulatory Visit: Payer: Self-pay | Admitting: Otolaryngology

## 2021-05-03 DIAGNOSIS — R591 Generalized enlarged lymph nodes: Secondary | ICD-10-CM | POA: Diagnosis not present

## 2021-05-03 DIAGNOSIS — R202 Paresthesia of skin: Secondary | ICD-10-CM | POA: Diagnosis not present

## 2021-05-03 DIAGNOSIS — M79602 Pain in left arm: Secondary | ICD-10-CM

## 2021-05-12 ENCOUNTER — Other Ambulatory Visit: Payer: Self-pay | Admitting: Otolaryngology

## 2021-05-12 DIAGNOSIS — R221 Localized swelling, mass and lump, neck: Secondary | ICD-10-CM

## 2021-05-13 ENCOUNTER — Other Ambulatory Visit: Payer: Self-pay

## 2021-05-13 ENCOUNTER — Ambulatory Visit
Admission: RE | Admit: 2021-05-13 | Discharge: 2021-05-13 | Disposition: A | Discharge status: Home / Self Care | Payer: 59 | Source: Ambulatory Visit | Attending: Otolaryngology | Admitting: Otolaryngology

## 2021-05-13 DIAGNOSIS — R202 Paresthesia of skin: Secondary | ICD-10-CM | POA: Diagnosis not present

## 2021-05-13 DIAGNOSIS — R221 Localized swelling, mass and lump, neck: Secondary | ICD-10-CM | POA: Insufficient documentation

## 2021-05-13 DIAGNOSIS — R200 Anesthesia of skin: Secondary | ICD-10-CM | POA: Diagnosis not present

## 2021-05-13 DIAGNOSIS — R519 Headache, unspecified: Secondary | ICD-10-CM | POA: Diagnosis not present

## 2021-05-13 MED ORDER — GADOBUTROL 1 MMOL/ML IV SOLN
9.0000 mL | Freq: Once | INTRAVENOUS | Status: AC | PRN
  Administered 2021-05-13: 9 mL via INTRAVENOUS

## 2021-05-23 DIAGNOSIS — M457 Ankylosing spondylitis of lumbosacral region: Secondary | ICD-10-CM | POA: Diagnosis not present

## 2021-06-03 ENCOUNTER — Other Ambulatory Visit: Payer: Self-pay

## 2021-06-03 MED ORDER — TRAMADOL HCL 50 MG PO TABS
ORAL_TABLET | ORAL | 0 refills | Status: DC
  Filled 2021-06-03: qty 21, 7d supply, fill #0

## 2021-06-17 ENCOUNTER — Other Ambulatory Visit: Payer: Self-pay

## 2021-06-17 DIAGNOSIS — R350 Frequency of micturition: Secondary | ICD-10-CM | POA: Diagnosis not present

## 2021-06-17 DIAGNOSIS — N898 Other specified noninflammatory disorders of vagina: Secondary | ICD-10-CM | POA: Diagnosis not present

## 2021-06-17 DIAGNOSIS — R399 Unspecified symptoms and signs involving the genitourinary system: Secondary | ICD-10-CM | POA: Diagnosis not present

## 2021-06-17 MED ORDER — ONDANSETRON 4 MG PO TBDP
4.0000 mg | ORAL_TABLET | Freq: Three times a day (TID) | ORAL | 0 refills | Status: DC | PRN
  Filled 2021-06-17: qty 20, 7d supply, fill #0

## 2021-06-17 MED ORDER — CEFDINIR 300 MG PO CAPS
ORAL_CAPSULE | ORAL | 0 refills | Status: DC
  Filled 2021-06-17: qty 14, 7d supply, fill #0

## 2021-06-27 ENCOUNTER — Other Ambulatory Visit: Payer: Self-pay

## 2021-06-27 DIAGNOSIS — R300 Dysuria: Secondary | ICD-10-CM | POA: Diagnosis not present

## 2021-06-27 MED ORDER — NITROFURANTOIN MONOHYD MACRO 100 MG PO CAPS
ORAL_CAPSULE | ORAL | 0 refills | Status: DC
  Filled 2021-06-27: qty 10, 5d supply, fill #0

## 2021-06-30 ENCOUNTER — Other Ambulatory Visit: Payer: Self-pay

## 2021-06-30 MED ORDER — TRAMADOL HCL 50 MG PO TABS
ORAL_TABLET | ORAL | 0 refills | Status: DC
  Filled 2021-06-30: qty 21, 7d supply, fill #0

## 2021-07-01 ENCOUNTER — Other Ambulatory Visit: Payer: Self-pay

## 2021-07-01 MED ORDER — TRAZODONE HCL 100 MG PO TABS
ORAL_TABLET | ORAL | 2 refills | Status: DC
  Filled 2021-07-01: qty 30, 30d supply, fill #0
  Filled 2021-08-26: qty 30, 30d supply, fill #1

## 2021-07-05 ENCOUNTER — Other Ambulatory Visit: Payer: Self-pay

## 2021-07-05 DIAGNOSIS — N898 Other specified noninflammatory disorders of vagina: Secondary | ICD-10-CM | POA: Diagnosis not present

## 2021-07-05 DIAGNOSIS — R21 Rash and other nonspecific skin eruption: Secondary | ICD-10-CM | POA: Diagnosis not present

## 2021-07-05 DIAGNOSIS — N760 Acute vaginitis: Secondary | ICD-10-CM | POA: Diagnosis not present

## 2021-07-05 MED ORDER — NYSTATIN-TRIAMCINOLONE 100000-0.1 UNIT/GM-% EX OINT
TOPICAL_OINTMENT | Freq: Two times a day (BID) | CUTANEOUS | 2 refills | Status: DC
  Filled 2021-07-05: qty 30, 10d supply, fill #0

## 2021-07-06 NOTE — Progress Notes (Signed)
07/07/2021 7:09 PM   Hailey [name] Sep 29, 1975 [id number]  Referring provider: Glean Hess, MD [address] [place],  [place] [address]  Chief Complaint  Patient presents with   Urinary Frequency    Urological history: 1. Interstitial cystitis - cystoscopy 2013 - NED - urine cytology negative 2018 - failed OAB medications - failed Elmiron - some success with amitriptyline and rescue solutions  2. High risk hematuria - former smoker - CTU and cysto 2013 - NED - urine cytology negative 2018 - Contrast CT 2019 - NED - no reports of gross heme - UA negative for micro heme    HPI: Hailey [name] is a 45 y.o. female who presents today for excessive and frequent urination.  She is having an increase in frequency, urgency, bilateral flank pain and feet swelling for the last three weeks.  It has also been associated with nausea and chills.  She is concerned that she may have an infection or this may be a reaction to new medications.    Patient denies any modifying or aggravating factors.  Patient denies any gross hematuria, dysuria or suprapubic.  Patient denies any fevers, chills, nausea or vomiting.    Her UA was benign.    PMH: Past Medical History:  Diagnosis Date   Allergic state    Allergy    Arthritis    Asthma    Cervical disc disease    Depression    GERD (gastroesophageal reflux disease)    H/O colonoscopy    HPV (human papilloma virus) infection    Hypothyroidism    Interstitial cystitis    LGSIL (low grade squamous intraepithelial dysplasia)    Paresthesia    Status post laparoscopic cholecystectomy 07/13/2016    Surgical History: Past Surgical History:  Procedure Laterality Date   BREAST BIOPSY Right 10/18/2015   bx/clip- neg   CHOLECYSTECTOMY N/A 07/07/2016   Procedure: LAPAROSCOPIC CHOLECYSTECTOMY;  Surgeon: Olean Ree, MD;  Location: [facility] ORS;  Service: General;  Laterality: N/A;   COLONOSCOPY WITH PROPOFOL N/A 10/11/2015    Procedure: COLONOSCOPY WITH PROPOFOL;  Surgeon: Lollie Sails, MD;  Location: [facility] ENDOSCOPY;  Service: Endoscopy;  Laterality: N/A;   ESOPHAGOGASTRODUODENOSCOPY (EGD) WITH PROPOFOL N/A 10/11/2015   Procedure: ESOPHAGOGASTRODUODENOSCOPY (EGD) WITH PROPOFOL;  Surgeon: Lollie Sails, MD;  Location: [facility] ENDOSCOPY;  Service: Endoscopy;  Laterality: N/A;   TONSILLECTOMY     TOTAL THYROIDECTOMY  2005   goiter    Home Medications:  Allergies as of 07/07/2021       Reactions   Codeine Other (See Comments)   Hullacinations   Hydrocodone-acetaminophen Other (See Comments)   Cannot urinate on medication   Lodine [etodolac] Shortness Of Breath   Quetiapine Other (See Comments)   Excessive sedation at 50 mg.   Zanaflex [tizanidine Hcl]    Ciprofloxacin Hives   Escitalopram Other (See Comments)   Intolerant.   Latex Rash   Levaquin [levofloxacin In D5w] Rash   HIVES   Sulfa Antibiotics Other (See Comments)   Can't remember reactions        Medication List        Accurate as of July 07, 2021 11:59 PM. If you have any questions, ask your nurse or doctor.          STOP taking these medications    ampicillin 500 MG capsule Commonly known as: PRINCIPEN Stopped by: Kiah Vanalstine, PA-C   Biotin 1 MG Caps Stopped by: Zara Council, PA-C  cefdinir 300 MG capsule Commonly known as: OMNICEF Stopped by: Zara Council, PA-C   cholecalciferol 25 MCG (1000 UNIT) tablet Commonly known as: VITAMIN D3 Stopped by: Rocklin Soderquist, PA-C   ibuprofen 200 MG tablet Commonly known as: ADVIL Stopped by: Zara Council, PA-C   liothyronine 5 MCG tablet Commonly known as: CYTOMEL Stopped by: Azadeh Hyder, PA-C   methotrexate 2.5 MG tablet Stopped by: Mame Twombly, PA-C   nabumetone 500 MG tablet Commonly known as: RELAFEN Stopped by: Zelda Reames, PA-C   nitrofurantoin (macrocrystal-monohydrate) 100 MG capsule Commonly known as: MACROBID Stopped by:  Moriyah Byington, PA-C   predniSONE 10 MG tablet Commonly known as: DELTASONE Stopped by: Nusrat Encarnacion, PA-C       TAKE these medications    albuterol 108 (90 Base) MCG/ACT inhaler Commonly known as: VENTOLIN HFA Inhale 2 puffs into the lungs every 6 (six) hours as needed for wheezing or shortness of breath.   clobetasol 0.05 % Gel Commonly known as: TEMOVATE Apply twice daily   Fluticasone-Salmeterol 100-50 MCG/DOSE Aepb Commonly known as: ADVAIR Inhale 1 puff into the lungs 2 (two) times daily.   folic acid 1 MG tablet Commonly known as: FOLVITE TAKE 1 TABLET (1 MG TOTAL) BY MOUTH ONCE DAILY   gabapentin 300 MG capsule Commonly known as: NEURONTIN TAKE 1 CAPSULE BY MOUTH AT BEDTIME.   hydrocortisone 2.5 % cream   levothyroxine 175 MCG tablet Commonly known as: SYNTHROID Take by mouth. What changed: Another medication with the same name was removed. Continue taking this medication, and follow the directions you see here. Changed by: Zara Council, PA-C   nystatin-triamcinolone ointment Commonly known as: MYCOLOG Apply topically 2 (two) times daily   ondansetron 4 MG disintegrating tablet Commonly known as: ZOFRAN-ODT Dissolve 1 tablet (4 mg total) by mouth every 8 (eight) hours as needed for Nausea or Vomiting (Take 1 tablet (4 mg total) by mouth every 8 (eight) hours as needed for Nausea or Vomiting)   oxybutynin 5 MG tablet Commonly known as: DITROPAN TAKE 1 TABLET BY MOUTH 3 TIMES DAILY   sulfaSALAzine 500 MG EC tablet Commonly known as: AZULFIDINE Take 1 tablet (500 mg total) by mouth 2 (two) times daily   traMADol 50 MG tablet Commonly known as: ULTRAM Take 1 tablet (50 mg total) by mouth every 8 (eight) hours as needed for Pain for up to 7 days   traZODone 100 MG tablet Commonly known as: DESYREL Take 1 tablet (100 mg total) by mouth at bedtime (Take 1 tablet (100 mg total) by mouth at bedtime for 30 days)        Allergies:  Allergies   Allergen Reactions   Codeine Other (See Comments)    Hullacinations   Hydrocodone-Acetaminophen Other (See Comments)    Cannot urinate on medication   Lodine [Etodolac] Shortness Of Breath   Quetiapine Other (See Comments)    Excessive sedation at 50 mg.   Zanaflex [Tizanidine Hcl]    Ciprofloxacin Hives   Escitalopram Other (See Comments)    Intolerant.   Latex Rash   Levaquin [Levofloxacin In D5w] Rash    HIVES   Sulfa Antibiotics Other (See Comments)    Can't remember reactions    Family History: Family History  Problem Relation Age of Onset   Hypertension Mother    Stroke Mother    Cancer Mother    Goiter Mother    Diabetes Father    Hypertension Father    Hypertension Brother    Hypertension Maternal  Grandmother    Stroke Maternal Grandmother    Diabetes Maternal Grandmother    Breast cancer Cousin 54       pat cousin    Social History:  reports that she quit smoking about 19 years ago. Her smoking use included cigarettes. She has a 3.50 pack-year smoking history. She has never used smokeless tobacco. She reports current alcohol use. She reports that she does not use drugs.  ROS For pertinent review of systems please refer to history of present illness  Physical Exam: BP 107/74   Pulse 74   Ht 5\' 5"  (1.651 m)   Wt 218 lb (98.9 kg)   BMI 36.28 kg/m   Constitutional:  Well nourished. Alert and oriented, No acute distress. HEENT: [place] AT, mask in place.  Trachea midline Cardiovascular: No clubbing, cyanosis, or edema. Respiratory: Normal respiratory effort, no increased work of breathing. Neurologic: Grossly intact, no focal deficits, moving all 4 extremities. Psychiatric: Normal mood and affect.    Laboratory Data: Component     Latest Ref Rng & Units 07/07/2021  Specific Gravity, UA     1.005 - 1.030 >1.030 (H)  pH, UA     5.0 - 7.5 5.5  Color, UA     Yellow Yellow  Appearance Ur     Clear Cloudy (A)  Leukocytes,UA     Negative Negative   Protein,UA     Negative/Trace Negative  Glucose, UA     Negative Negative  Ketones, UA     Negative Negative  RBC, UA     Negative Negative  Bilirubin, UA     Negative Negative  Urobilinogen, Ur     0.2 - 1.0 mg/dL 0.2  Nitrite, UA     Negative Negative  Microscopic Examination      See below:   Component     Latest Ref Rng & Units 07/07/2021  WBC, UA     0 - 5 /hpf 0-5  RBC     0 - 2 /hpf 0-2  Epithelial Cells (non renal)     0 - 10 /hpf 0-10  Bacteria, UA     None seen/Few Few   Clue Cells, Vaginal None Seen None Seen   WBC ([place] Blood Cells), Vaginal None Seen Few Abnormal    Trichomonas, Vaginal None Seen None Seen   Bacteria, Vaginal None Seen Moderate Abnormal    Yeast, Vaginal None Seen None Seen   Resulting Agency  [facility] - LAB  Narrative Performed by [facility] - LAB Whiff test negative Specimen Collected: 07/05/21 10:31 Last Resulted: 07/05/21 10:45  Received From: [place]  Result Received: 07/06/21 11:34   Color Yellow, Violet, Light Violet, Dark Violet Yellow   Clarity Clear, Other Clear   Specific Gravity 1.000 - 1.030 >=1.030   pH, Urine 5.0 - 8.0 6.0   Protein, Urinalysis Negative, Trace mg/dL Negative   Glucose, Urinalysis Negative mg/dL Negative   Ketones, Urinalysis Negative mg/dL Negative   Blood, Urinalysis Negative Negative   Nitrite, Urinalysis Negative Negative   Leukocyte Esterase, Urinalysis Negative Negative   [place] Blood Cells, Urinalysis None Seen, 0-3 /hpf None Seen   Red Blood Cells, Urinalysis None Seen, 0-3 /hpf None Seen   Bacteria, Urinalysis None Seen /hpf Few Abnormal    Squamous Epithelial Cells, Urinalysis Rare, Few, None Seen /hpf Few   Resulting Agency  [place] [facility] - LAB  Specimen Collected: 06/27/21 15:44 Last Resulted: 06/27/21 16:00  Received From: [facility]  Result Received: 07/05/21 08:26   WBC ([place] Blood Cell Count) 4.1 - 10.2  10^3/uL 8.1   RBC (Red Blood Cell Count) 4.04 - 5.48 10^6/uL 4.43   Hemoglobin 12.0 - 15.0 gm/dL 13.5   Hematocrit 35.0 - 47.0 % 40.2   MCV (Mean Corpuscular Volume) 80.0 - 100.0 fl 90.7   MCH (Mean Corpuscular Hemoglobin) 27.0 - 31.2 pg 30.5   MCHC (Mean Corpuscular Hemoglobin Concentration) 32.0 - 36.0 gm/dL 33.6   Platelet Count 150 - 450 10^3/uL 359   RDW-CV (Red Cell Distribution Width) 11.6 - 14.8 % 13.3   MPV (Mean Platelet Volume) 9.4 - 12.4 fl 9.7   Neutrophils 1.50 - 7.80 10^3/uL 3.61   Lymphocytes 1.00 - 3.60 10^3/uL 3.10   Monocytes 0.00 - 1.50 10^3/uL 0.84   Eosinophils 0.00 - 0.55 10^3/uL 0.45   Basophils 0.00 - 0.09 10^3/uL 0.08   Neutrophil % 32.0 - 70.0 % 44.6   Lymphocyte % 10.0 - 50.0 % 38.3   Monocyte % 4.0 - 13.0 % 10.4   Eosinophil % 1.0 - 5.0 % 5.6 High    Basophil% 0.0 - 2.0 % 1.0   Immature Granulocyte % <=0.7 % 0.1   Immature Granulocyte Count <=0.06 10^3/L 0.01   Resulting Agency  [facility] - LAB  Specimen Collected: 06/17/21 10:30 Last Resulted: 06/17/21 10:37  Received From: [facility]  Result Received: 07/05/21 08:26   Hemoglobin A1C 4.2 - 5.6 % 5.9 High    Average Blood Glucose (Calc) mg/dL [facility] - LAB  Narrative Performed by [facility] - LAB Normal Range:    4.2 - 5.6%  Increased Risk:  5.7 - 6.4%  Diabetes:        >= 6.5%  Glycemic Control for adults with diabetes:  <7%   Specimen Collected: 03/07/21 11:20 Last Resulted: 03/07/21 14:34  Received From: [facility]  Result Received: 05/03/21 10:54  I have reviewed the labs.   Pertinent Imaging: N/A   Assessment & Plan:    1. IC -increase in urinary symptoms -continue the gabapentin 300 mg nightly -UA is benign  -Urine sent for culture to rule out indolent infection  2. Flank pain -patient concerned new medications may be affecting kidney function -BMP sent  -RUS ordered to rule out  hydronephrosis as a cause for flank pain   Return for pending urine cutlure results .  These notes generated with voice recognition software. I apologize for typographical errors.  Zara Council, PA-C  [facility] [address]  [place], [place] [address] [phone]

## 2021-07-07 ENCOUNTER — Encounter: Payer: Self-pay | Admitting: Urology

## 2021-07-07 ENCOUNTER — Other Ambulatory Visit: Payer: Self-pay

## 2021-07-07 ENCOUNTER — Ambulatory Visit (INDEPENDENT_AMBULATORY_CARE_PROVIDER_SITE_OTHER): Payer: 59 | Admitting: Urology

## 2021-07-07 VITALS — BP 107/74 | HR 74 | Ht 65.0 in | Wt 218.0 lb

## 2021-07-07 DIAGNOSIS — R109 Unspecified abdominal pain: Secondary | ICD-10-CM | POA: Diagnosis not present

## 2021-07-07 DIAGNOSIS — N3010 Interstitial cystitis (chronic) without hematuria: Secondary | ICD-10-CM | POA: Diagnosis not present

## 2021-07-08 ENCOUNTER — Other Ambulatory Visit: Payer: Self-pay

## 2021-07-08 LAB — BASIC METABOLIC PANEL
BUN/Creatinine Ratio: 16 (ref 9–23)
BUN: 11 mg/dL (ref 6–24)
CO2: 22 mmol/L (ref 20–29)
Calcium: 9.5 mg/dL (ref 8.7–10.2)
Chloride: 102 mmol/L (ref 96–106)
Creatinine, Ser: 0.69 mg/dL (ref 0.57–1.00)
Glucose: 97 mg/dL (ref 70–99)
Potassium: 4.8 mmol/L (ref 3.5–5.2)
Sodium: 139 mmol/L (ref 134–144)
eGFR: 109 mL/min/{1.73_m2} (ref >59)

## 2021-07-08 LAB — URINALYSIS, COMPLETE
Bilirubin, UA: NEGATIVE
Glucose, UA: NEGATIVE
Ketones, UA: NEGATIVE
Leukocytes,UA: NEGATIVE
Nitrite, UA: NEGATIVE
Protein,UA: NEGATIVE
RBC, UA: NEGATIVE
Specific Gravity, UA: >1.03 — ABNORMAL HIGH (ref 1.005–1.030)
Urobilinogen, Ur: 0.2 mg/dL (ref 0.2–1.0)
pH, UA: 5.5 (ref 5.0–7.5)

## 2021-07-08 LAB — MICROSCOPIC EXAMINATION

## 2021-07-08 MED ORDER — FLUARIX QUADRIVALENT 0.5 ML IM SUSY
PREFILLED_SYRINGE | INTRAMUSCULAR | 0 refills | Status: DC
  Filled 2021-07-08 – 2021-07-11 (×2): qty 0.5, 1d supply, fill #0

## 2021-07-10 LAB — CULTURE, URINE COMPREHENSIVE

## 2021-07-11 ENCOUNTER — Other Ambulatory Visit: Payer: Self-pay

## 2021-07-15 ENCOUNTER — Other Ambulatory Visit: Payer: Self-pay

## 2021-07-15 DIAGNOSIS — B356 Tinea cruris: Secondary | ICD-10-CM | POA: Diagnosis not present

## 2021-07-15 MED ORDER — CELECOXIB 100 MG PO CAPS
ORAL_CAPSULE | ORAL | 2 refills | Status: DC
  Filled 2021-07-15: qty 60, 30d supply, fill #0
  Filled 2021-08-22: qty 60, 30d supply, fill #1

## 2021-07-15 MED ORDER — KETOCONAZOLE 2 % EX CREA
TOPICAL_CREAM | CUTANEOUS | 2 refills | Status: DC
  Filled 2021-07-15: qty 60, 30d supply, fill #0

## 2021-07-15 MED ORDER — TERBINAFINE HCL 250 MG PO TABS
ORAL_TABLET | ORAL | 0 refills | Status: DC
  Filled 2021-07-15: qty 14, 14d supply, fill #0

## 2021-08-15 ENCOUNTER — Ambulatory Visit: Payer: 59

## 2021-08-19 DIAGNOSIS — H5203 Hypermetropia, bilateral: Secondary | ICD-10-CM | POA: Diagnosis not present

## 2021-08-22 ENCOUNTER — Other Ambulatory Visit: Payer: Self-pay

## 2021-08-22 MED ORDER — LEVOTHYROXINE SODIUM 175 MCG PO TABS
175.0000 ug | ORAL_TABLET | Freq: Every day | ORAL | 1 refills | Status: DC
  Filled 2021-08-22: qty 90, 90d supply, fill #0
  Filled 2022-01-16: qty 30, 30d supply, fill #0
  Filled 2022-02-22: qty 90, 90d supply, fill #1

## 2021-08-25 ENCOUNTER — Other Ambulatory Visit: Payer: Self-pay

## 2021-08-26 ENCOUNTER — Other Ambulatory Visit: Payer: Self-pay

## 2021-08-29 ENCOUNTER — Other Ambulatory Visit: Payer: Self-pay

## 2021-08-29 DIAGNOSIS — M170 Bilateral primary osteoarthritis of knee: Secondary | ICD-10-CM | POA: Diagnosis not present

## 2021-08-29 DIAGNOSIS — Z79899 Other long term (current) drug therapy: Secondary | ICD-10-CM | POA: Diagnosis not present

## 2021-08-29 DIAGNOSIS — E890 Postprocedural hypothyroidism: Secondary | ICD-10-CM | POA: Diagnosis not present

## 2021-08-29 DIAGNOSIS — M457 Ankylosing spondylitis of lumbosacral region: Secondary | ICD-10-CM | POA: Diagnosis not present

## 2021-08-29 MED ORDER — TRAMADOL HCL 50 MG PO TABS
ORAL_TABLET | ORAL | 0 refills | Status: DC
  Filled 2021-08-29: qty 14, 7d supply, fill #0

## 2021-08-30 ENCOUNTER — Other Ambulatory Visit: Payer: Self-pay

## 2021-09-02 ENCOUNTER — Telehealth: Payer: Self-pay | Admitting: Pharmacist

## 2021-09-02 ENCOUNTER — Other Ambulatory Visit (HOSPITAL_COMMUNITY): Payer: Self-pay

## 2021-09-02 MED ORDER — RINVOQ 15 MG PO TB24
ORAL_TABLET | ORAL | 11 refills | Status: DC
  Filled 2021-09-02 – 2021-12-05 (×3): qty 30, 30d supply, fill #0

## 2021-09-02 NOTE — Telephone Encounter (Signed)
Called patient to schedule an appointment for the [facility]. I was unable to reach the patient so I left a HIPAA-compliant message requesting that the patient return my call.   Luke Van Ausdall, PharmD, BCACP, CPP Clinical Pharmacist [facility] [phone]  

## 2021-09-16 ENCOUNTER — Encounter: Payer: Self-pay | Admitting: *Deleted

## 2021-09-23 ENCOUNTER — Inpatient Hospital Stay: Payer: 59 | Attending: Internal Medicine | Admitting: Internal Medicine

## 2021-09-23 ENCOUNTER — Encounter: Payer: Self-pay | Admitting: Internal Medicine

## 2021-09-23 ENCOUNTER — Other Ambulatory Visit: Payer: Self-pay

## 2021-09-23 ENCOUNTER — Inpatient Hospital Stay: Payer: 59

## 2021-09-23 DIAGNOSIS — R591 Generalized enlarged lymph nodes: Secondary | ICD-10-CM | POA: Insufficient documentation

## 2021-09-23 DIAGNOSIS — M542 Cervicalgia: Secondary | ICD-10-CM | POA: Insufficient documentation

## 2021-09-23 DIAGNOSIS — Z803 Family history of malignant neoplasm of breast: Secondary | ICD-10-CM | POA: Insufficient documentation

## 2021-09-23 DIAGNOSIS — C561 Malignant neoplasm of right ovary: Secondary | ICD-10-CM | POA: Insufficient documentation

## 2021-09-23 DIAGNOSIS — M2550 Pain in unspecified joint: Secondary | ICD-10-CM | POA: Insufficient documentation

## 2021-09-23 DIAGNOSIS — Z833 Family history of diabetes mellitus: Secondary | ICD-10-CM | POA: Insufficient documentation

## 2021-09-23 DIAGNOSIS — R109 Unspecified abdominal pain: Secondary | ICD-10-CM | POA: Insufficient documentation

## 2021-09-23 DIAGNOSIS — M461 Sacroiliitis, not elsewhere classified: Secondary | ICD-10-CM | POA: Insufficient documentation

## 2021-09-23 DIAGNOSIS — Z87891 Personal history of nicotine dependence: Secondary | ICD-10-CM | POA: Diagnosis not present

## 2021-09-23 DIAGNOSIS — E669 Obesity, unspecified: Secondary | ICD-10-CM | POA: Diagnosis not present

## 2021-09-23 DIAGNOSIS — Z9049 Acquired absence of other specified parts of digestive tract: Secondary | ICD-10-CM | POA: Diagnosis not present

## 2021-09-23 DIAGNOSIS — Z882 Allergy status to sulfonamides status: Secondary | ICD-10-CM | POA: Insufficient documentation

## 2021-09-23 DIAGNOSIS — M459 Ankylosing spondylitis of unspecified sites in spine: Secondary | ICD-10-CM | POA: Insufficient documentation

## 2021-09-23 DIAGNOSIS — Z881 Allergy status to other antibiotic agents status: Secondary | ICD-10-CM | POA: Diagnosis not present

## 2021-09-23 DIAGNOSIS — Z885 Allergy status to narcotic agent status: Secondary | ICD-10-CM | POA: Diagnosis not present

## 2021-09-23 DIAGNOSIS — J45909 Unspecified asthma, uncomplicated: Secondary | ICD-10-CM | POA: Insufficient documentation

## 2021-09-23 DIAGNOSIS — Z8249 Family history of ischemic heart disease and other diseases of the circulatory system: Secondary | ICD-10-CM | POA: Insufficient documentation

## 2021-09-23 DIAGNOSIS — Z8049 Family history of malignant neoplasm of other genital organs: Secondary | ICD-10-CM | POA: Diagnosis not present

## 2021-09-23 DIAGNOSIS — R590 Localized enlarged lymph nodes: Secondary | ICD-10-CM | POA: Diagnosis not present

## 2021-09-23 DIAGNOSIS — R5383 Other fatigue: Secondary | ICD-10-CM

## 2021-09-23 DIAGNOSIS — R231 Pallor: Secondary | ICD-10-CM | POA: Diagnosis not present

## 2021-09-23 DIAGNOSIS — Z823 Family history of stroke: Secondary | ICD-10-CM | POA: Insufficient documentation

## 2021-09-23 DIAGNOSIS — E538 Deficiency of other specified B group vitamins: Secondary | ICD-10-CM

## 2021-09-23 DIAGNOSIS — M2540 Effusion, unspecified joint: Secondary | ICD-10-CM | POA: Diagnosis not present

## 2021-09-23 DIAGNOSIS — Z79899 Other long term (current) drug therapy: Secondary | ICD-10-CM | POA: Insufficient documentation

## 2021-09-23 DIAGNOSIS — M549 Dorsalgia, unspecified: Secondary | ICD-10-CM | POA: Diagnosis not present

## 2021-09-23 DIAGNOSIS — R61 Generalized hyperhidrosis: Secondary | ICD-10-CM | POA: Diagnosis not present

## 2021-09-23 DIAGNOSIS — Z8349 Family history of other endocrine, nutritional and metabolic diseases: Secondary | ICD-10-CM | POA: Insufficient documentation

## 2021-09-23 DIAGNOSIS — Z8052 Family history of malignant neoplasm of bladder: Secondary | ICD-10-CM | POA: Insufficient documentation

## 2021-09-23 LAB — CBC WITH DIFFERENTIAL/PLATELET
Abs Immature Granulocytes: 0.02 10*3/uL (ref 0.00–0.07)
Basophils Absolute: 0.1 10*3/uL (ref 0.0–0.1)
Basophils Relative: 1 %
Eosinophils Absolute: 0.5 10*3/uL (ref 0.0–0.5)
Eosinophils Relative: 6 %
HCT: 35.9 % — ABNORMAL LOW (ref 36.0–46.0)
Hemoglobin: 12.3 g/dL (ref 12.0–15.0)
Immature Granulocytes: 0 %
Lymphocytes Relative: 36 %
Lymphs Abs: 3.1 10*3/uL (ref 0.7–4.0)
MCH: 30.2 pg (ref 26.0–34.0)
MCHC: 34.3 g/dL (ref 30.0–36.0)
MCV: 88.2 fL (ref 80.0–100.0)
Monocytes Absolute: 0.8 10*3/uL (ref 0.1–1.0)
Monocytes Relative: 10 %
Neutro Abs: 4 10*3/uL (ref 1.7–7.7)
Neutrophils Relative %: 47 %
Platelets: 372 10*3/uL (ref 150–400)
RBC: 4.07 MIL/uL (ref 3.87–5.11)
RDW: 12.8 % (ref 11.5–15.5)
WBC: 8.5 10*3/uL (ref 4.0–10.5)
nRBC: 0 % (ref 0.0–0.2)

## 2021-09-23 LAB — COMPREHENSIVE METABOLIC PANEL
ALT: 23 U/L (ref 0–44)
AST: 21 U/L (ref 15–41)
Albumin: 4.1 g/dL (ref 3.5–5.0)
Alkaline Phosphatase: 94 U/L (ref 38–126)
Anion gap: 5 (ref 5–15)
BUN: 14 mg/dL (ref 6–20)
CO2: 24 mmol/L (ref 22–32)
Calcium: 8.8 mg/dL — ABNORMAL LOW (ref 8.9–10.3)
Chloride: 104 mmol/L (ref 98–111)
Creatinine, Ser: 0.51 mg/dL (ref 0.44–1.00)
GFR, Estimated: >60 mL/min (ref >60)
Glucose, Bld: 108 mg/dL — ABNORMAL HIGH (ref 70–99)
Potassium: 3.7 mmol/L (ref 3.5–5.1)
Sodium: 133 mmol/L — ABNORMAL LOW (ref 135–145)
Total Bilirubin: 0.4 mg/dL (ref 0.3–1.2)
Total Protein: 7.4 g/dL (ref 6.5–8.1)

## 2021-09-23 LAB — FERRITIN: Ferritin: 68 ng/mL (ref 11–307)

## 2021-09-23 LAB — IRON AND TIBC
Iron: 64 ug/dL (ref 28–170)
Saturation Ratios: 16 % (ref 10.4–31.8)
TIBC: 396 ug/dL (ref 250–450)
UIBC: 332 ug/dL

## 2021-09-23 LAB — VITAMIN B12: Vitamin B-12: 387 pg/mL (ref 180–914)

## 2021-09-23 LAB — FOLATE: Folate: 22 ng/mL (ref >5.9)

## 2021-09-23 LAB — C-REACTIVE PROTEIN: CRP: 1.1 mg/dL — ABNORMAL HIGH (ref <1.0)

## 2021-09-23 LAB — LACTATE DEHYDROGENASE: LDH: 123 U/L (ref 98–192)

## 2021-09-23 NOTE — Progress Notes (Signed)
New patient evaluation.   

## 2021-09-23 NOTE — Assessment & Plan Note (Addendum)
#  Mild subcentimeter bilateral neck adenopathy-MRI neck /Dr.Vaught-SEP 2022-bilateral left lower than right subcentimeter lymphadenopathy.  I do not clinically suspect the subcentimeter lymphadenopathy causing patient's symptoms of neck pain etc.  No otherwise lymphadenopathy noted.  Discussed the multiple etiologies of lymphadenopathy including malignancy/autoimmune diseases/reactive causes.  Do not suspect any malignancy given the overall stability of the lymph nodes for the last 2 years.  Suspect autoimmune causes/defer to Dr. Posey Pronto.  #Multitude of symptoms-joint pains/joint swelling; myalgias/episodes of pallor-extreme fatigue-?  Unclear etiology.  Recommend work-up including CBC CMP; LDH U54 folic acid; iron studies ferritin.  # Hypothyroidism- DEC 11 TSH [Dr.Solum-Synthroid with celecoxib]- awaiting repeating end of Jan 2023.   #Obesity: Counseled the patient regarding weight loss/and also exercise.   Thank you Dr.Patel for allowing me to participate in the care of your pleasant patient. Please do not hesitate to contact me with questions or concerns in the interim.  I left a message for Dr. Posey Pronto to discuss patient's ongoing plan of care.  # DISPOSITION: # cbc/cmp; LDH; iron studies; ferritin; b12 levels; CRP # follow up as needed-Dr.B

## 2021-09-23 NOTE — Progress Notes (Signed)
[facility] NOTE  Patient Care Team: Glean Hess, MD as PCP - General (Internal Medicine) Elayne Snare, MD as Consulting Physician (Endocrinology) Renata Caprice as Physician Assistant (Orthopedic Surgery) Hollice Espy, MD as Consulting Physician (Urology) Benjaman Kindler, MD as Consulting Physician (Obstetrics and Gynecology) Hessie Knows, MD as Consulting Physician (Orthopedic Surgery) Leafy Kindle (Rheumatology) Cammie Sickle, MD as Consulting Physician (Hematology and Oncology) Ree Edman, MD as Consulting Physician (Dermatology) Gabriel Carina, Betsey Holiday, MD as Consulting Physician (Endocrinology)  CHIEF COMPLAINTS/PURPOSE OF CONSULTATION: Lymphadenopathy   # Ankylosing spondylitis [SI pain]- Dr.Patel- on Tylenol/ Tramadol prn.   IMPRESSION: 1. No acute abnormality. 2. 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 3. 1.7 cm oval well-defined T2 hyperintense, T1 isointense lesion in the left quadratus femoris muscle, indeterminate. Differential considerations include a ganglion cyst, intramuscular myxoma, or peripheral nerve sheath tumor. Recommend follow-up MRI pelvis with and without contrast in 3-6 months to evaluate for interval change. 4. 3.0 cm simple appearing cyst in the right ovary. No follow-up imaging recommended. Note: This recommendation does not apply to premenarchal patients and to those with increased risk (genetic, family history, elevated tumor markers or other high-risk factors) of ovarian cancer. Reference: JACR 2020 Feb; 17(2):248-254     Electronically Signed   By: Titus Dubin M.D.   On: 12/18/2020 16:48  SEP 2022- CT NECK- [Dr.Vaught]- Negative for mass or adenopathy   Small bilateral lymph nodes appear unchanged from 2020 CT. This includes areas the area of palpable concern in the left lateral and posterior neck.     HISTORY OF PRESENTING ILLNESS: Obese.  Ambulating  independently.  Laurence Folz [place] 46 y.o.  female with a history of Ankylosing spondylitis; history of asthma is referred to [place] for further evaluation of lymphadenopathy.  Patient noted to have intermittent swelling in the neck/with pain in the left side on and off for the last few years.  Patient had imaging including MRI of the neck-September 2022-that showed subcentimeter lymph nodes in bilateral/up to 67 mm left posterior neck/level 2.   Patient has multitude of complaints including-bilateral joint pains; joint swelling.  Extreme fatigue.  Complains of upper abdominal discomfort; complains of f pallor intermittently.   Review of Systems  Constitutional:  Positive for diaphoresis and malaise/fatigue. Negative for chills, fever and weight loss.  HENT:  Negative for nosebleeds and sore throat.   Eyes:  Negative for double vision.  Respiratory:  Negative for cough, hemoptysis, sputum production, shortness of breath and wheezing.   Cardiovascular:  Negative for chest pain, palpitations, orthopnea and leg swelling.  Gastrointestinal:  Positive for abdominal pain. Negative for blood in stool, constipation, diarrhea, heartburn, melena, nausea and vomiting.  Genitourinary:  Negative for dysuria, frequency and urgency.  Musculoskeletal:  Positive for back pain, joint pain and neck pain.  Skin: Negative.  Negative for itching and rash.  Neurological:  Negative for dizziness, tingling, focal weakness, weakness and headaches.  Endo/Heme/Allergies:  Does not bruise/bleed easily.  Psychiatric/Behavioral:  Negative for depression. The patient is not nervous/anxious and does not have insomnia.     MEDICAL HISTORY:  Past Medical History:  Diagnosis Date   Allergic state    Allergy    Ankylosing spondylitis ([place])    followed by rheumatology Dr. Posey Pronto   Arthritis    Asthma    Cervical disc disease    Depression    Enlarged lymph nodes    GERD (gastroesophageal reflux disease)    H/O colonoscopy  HPV (human papilloma virus) infection    Hypothyroidism    Interstitial cystitis    LGSIL (low grade squamous intraepithelial dysplasia)    Paresthesia    Status post laparoscopic cholecystectomy 07/13/2016    SURGICAL HISTORY: Past Surgical History:  Procedure Laterality Date   BREAST BIOPSY Right 10/18/2015   bx/clip- neg   CHOLECYSTECTOMY N/A 07/07/2016   Procedure: LAPAROSCOPIC CHOLECYSTECTOMY;  Surgeon: Olean Ree, MD;  Location: [facility] ORS;  Service: General;  Laterality: N/A;   COLONOSCOPY WITH PROPOFOL N/A 10/11/2015   Procedure: COLONOSCOPY WITH PROPOFOL;  Surgeon: Lollie Sails, MD;  Location: [facility]COPY;  Service: Endoscopy;  Laterality: N/A;   ESOPHAGOGASTRODUODENOSCOPY (EGD) WITH PROPOFOL N/A 10/11/2015   Procedure: ESOPHAGOGASTRODUODENOSCOPY (EGD) WITH PROPOFOL;  Surgeon: Lollie Sails, MD;  Location: [facility] ENDOSCOPY;  Service: Endoscopy;  Laterality: N/A;   TONSILLECTOMY     TOTAL THYROIDECTOMY  2005   goiter    SOCIAL HISTORY: Social History   Socioeconomic History   Marital status: Married    Spouse name: Not on file   Number of children: 1   Years of education: Not on file   Highest education level: Not on file  Occupational History   Not on file  Tobacco Use   Smoking status: Former    Packs/day: 0.50    Years: 7.00    Pack years: 3.50    Types: Cigarettes    Quit date: 2003    Years since quitting: 20.0    Passive exposure: Never   Smokeless tobacco: Never  Vaping Use   Vaping Use: Never used  Substance and Sexual Activity   Alcohol use: Yes    Comment: Occ.   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    Social Determinants of Health   Financial Resource Strain: Not on file  Food Insecurity: Not on file  Transportation Needs: Not on file   Physical Activity: Not on file  Stress: Not on file  Social Connections: Not on file  Intimate Partner Violence: Not on file    FAMILY HISTORY: Family History  Problem Relation Age of Onset   Cancer Mother        Vulva cancer   Hypertension Mother    Stroke Mother    Goiter Mother    Diabetes Father    Hypertension Father    Hypertension Brother    Bladder Cancer Maternal Aunt    Hypertension Maternal Grandmother    Stroke Maternal Grandmother    Diabetes Maternal Grandmother    Breast cancer Cousin 64       pat cousin    ALLERGIES:  is allergic to hydrocodone-acetaminophen, lodine [etodolac], quetiapine, zanaflex [tizanidine hcl], ciprofloxacin, escitalopram, latex, levaquin [levofloxacin in d5w], and sulfa antibiotics.  MEDICATIONS:  Current Outpatient Medications  Medication Sig Dispense Refill   albuterol (VENTOLIN HFA) 108 (90 Base) MCG/ACT inhaler Inhale 2 puffs into the lungs every 6 (six) hours as needed for wheezing or shortness of breath. 54 g 0   clobetasol (TEMOVATE) 0.05 % GEL Apply twice daily 60 g 3   Fluticasone-Salmeterol (ADVAIR) 100-50 MCG/DOSE AEPB Inhale 1 puff into the lungs 2 (two) times daily. (Patient taking differently: Inhale 1 puff into the lungs 2 (two) times daily as needed.) 3 each 3   hydrocortisone 2.5 %  cream      ketoconazole (NIZORAL) 2 % cream 1(one) application(s) topical 2(two) times a day. Apply to Pelvis 60 g 2   levothyroxine (SYNTHROID) 175 MCG tablet Take 1 tablet (175 mcg total) by mouth daily. 90 tablet 1   liothyronine (CYTOMEL) 5 MCG tablet Take 1 tablet (5 mcg total) by mouth once daily 90 tablet 1   nystatin-triamcinolone ointment (MYCOLOG) Apply topically 2 (two) times daily 30 g 2   ondansetron (ZOFRAN-ODT) 4 MG disintegrating tablet Take 1 tablet (4 mg total) by mouth every 8 (eight) hours as needed for Nausea or Vomiting 20 tablet 0   traMADol (ULTRAM) 50 MG tablet Take 1 tablet (50 mg total) by mouth 2 (two) times daily  as needed for Pain for up to 7 days 14 tablet 0   traZODone (DESYREL) 100 MG tablet Take 1 tablet (100 mg total) by mouth at bedtime for 30 days 30 tablet 2   celecoxib (CELEBREX) 100 MG capsule Take 1 capsule (100 mg total) by mouth 2 (two) times daily for 30 days (Patient not taking: Reported on 09/23/2021) 60 capsule 2   folic acid (FOLVITE) 1 MG tablet TAKE 1 TABLET (1 MG TOTAL) BY MOUTH ONCE DAILY (Patient not taking: Reported on 09/23/2021) 90 tablet 3   gabapentin (NEURONTIN) 300 MG capsule TAKE 1 CAPSULE BY MOUTH AT BEDTIME. (Patient not taking: Reported on 09/23/2021) 90 capsule 3   influenza vac split quadrivalent PF (FLUARIX QUADRIVALENT) 0.5 ML injection Inject into the muscle. (Patient not taking: Reported on 09/23/2021) 0.5 mL 0   oxybutynin (DITROPAN) 5 MG tablet TAKE 1 TABLET BY MOUTH 3 TIMES DAILY (Patient not taking: Reported on 09/23/2021) 270 tablet 3   sulfaSALAzine (AZULFIDINE) 500 MG EC tablet Take 1 tablet (500 mg total) by mouth 2 (two) times daily (Patient not taking: Reported on 09/23/2021) 60 tablet 3   terbinafine (LAMISIL) 250 MG tablet 1(one) tablet(s) oral every day (Patient not taking: Reported on 09/23/2021) 14 tablet 0   Upadacitinib ER (RINVOQ) 15 MG TB24 take 1 tablet by mouth daily as directed (Patient not taking: Reported on 09/23/2021) 30 tablet 11   No current facility-administered medications for this visit.     PHYSICAL EXAMINATION:  Vitals:   09/23/21 1127  BP: (!) 158/96  Pulse: 76  Resp: 18  Temp: 98.1 F (36.7 C)   Filed Weights   09/23/21 1127  Weight: 242 lb 9.6 oz (110 kg)   Subcentimeter lymph node noted posterior neck left side.  Physical Exam Vitals and nursing note reviewed.  HENT:     Head: Normocephalic and atraumatic.     Mouth/Throat:     Pharynx: Oropharynx is clear.  Eyes:     Extraocular Movements: Extraocular movements intact.     Pupils: Pupils are equal, round, and reactive to light.  Cardiovascular:     Rate and Rhythm:  Normal rate and regular rhythm.  Pulmonary:     Comments: Decreased breath sounds bilaterally.  Abdominal:     Palpations: Abdomen is soft.  Musculoskeletal:        General: Normal range of motion.     Cervical back: Normal range of motion.  Skin:    General: Skin is warm.  Neurological:     General: No focal deficit present.     Mental Status: She is alert and oriented to person, place, and time.  Psychiatric:        Behavior: Behavior normal.        Judgment:  Judgment normal.     LABORATORY DATA:  I have reviewed the data as listed Lab Results  Component Value Date   WBC 8.5 09/23/2021   HGB 12.3 09/23/2021   HCT 35.9 (L) 09/23/2021   MCV 88.2 09/23/2021   PLT 372 09/23/2021   Recent Labs    01/14/21 1047 07/07/21 1347 09/23/21 1220  NA 137 139 133*  K 4.6 4.8 3.7  CL 105 102 104  CO2 24 22 24   GLUCOSE 105* 97 108*  BUN 12 11 14   CREATININE 0.68 0.69 0.51  CALCIUM 9.1 9.5 8.8*  GFRNONAA >60  --  >60  PROT 7.5  --  7.4  ALBUMIN 4.2  --  4.1  AST 18  --  21  ALT 15  --  23  ALKPHOS 86  --  94  BILITOT 0.7  --  0.4    RADIOGRAPHIC STUDIES: I have personally reviewed the radiological images as listed and agreed with the findings in the report. No results found.  Generalized lymphadenopathy #Mild subcentimeter bilateral neck adenopathy-MRI neck /Dr.Vaught-SEP 2022-bilateral left lower than right subcentimeter lymphadenopathy.  I do not clinically suspect the subcentimeter lymphadenopathy causing patient's symptoms of neck pain etc.  No otherwise lymphadenopathy noted.  Discussed the multiple etiologies of lymphadenopathy including malignancy/autoimmune diseases/reactive causes.  Do not suspect any malignancy given the overall stability of the lymph nodes for the last 2 years.  Suspect autoimmune causes/defer to Dr. Posey Pronto.  #Multitude of symptoms-joint pains/joint swelling; myalgias/episodes of pallor-extreme fatigue-?  Unclear etiology.  Recommend work-up  including CBC CMP; LDH H84 folic acid; iron studies ferritin.  # Hypothyroidism- DEC 11 TSH [Dr.Solum-Synthroid with celecoxib]- awaiting repeating end of Jan 2023.   #Obesity: Counseled the patient regarding weight loss/and also exercise.   Thank you Dr.Patel for allowing me to participate in the care of your pleasant patient. Please do not hesitate to contact me with questions or concerns in the interim.  I left a message for Dr. Posey Pronto to discuss patient's ongoing plan of care.  # DISPOSITION: # cbc/cmp; LDH; iron studies; ferritin; b12 levels; CRP # follow up as needed-Dr.B  All questions were answered. The patient knows to call the clinic with any problems, questions or concerns.    Cammie Sickle, MD 09/23/2021 1:13 PM

## 2021-09-29 ENCOUNTER — Encounter: Payer: Self-pay | Admitting: Internal Medicine

## 2021-09-30 ENCOUNTER — Encounter: Payer: Self-pay | Admitting: Internal Medicine

## 2021-09-30 ENCOUNTER — Other Ambulatory Visit: Payer: Self-pay | Admitting: Internal Medicine

## 2021-09-30 DIAGNOSIS — R591 Generalized enlarged lymph nodes: Secondary | ICD-10-CM

## 2021-09-30 NOTE — Progress Notes (Signed)
Please schedule CT scans- ASAP;  # follow up TBD-GB

## 2021-09-30 NOTE — Progress Notes (Signed)
Spoke to patient Regarding her concerns for new lymph nodes in the neck. Will get CT scan neck/chest/abdomen/pelvis ASAP.

## 2021-10-03 ENCOUNTER — Ambulatory Visit
Admission: RE | Admit: 2021-10-03 | Discharge: 2021-10-03 | Disposition: A | Discharge status: Home / Self Care | Payer: 59 | Source: Ambulatory Visit | Attending: Internal Medicine | Admitting: Internal Medicine

## 2021-10-03 ENCOUNTER — Other Ambulatory Visit: Payer: Self-pay

## 2021-10-03 ENCOUNTER — Telehealth: Payer: Self-pay | Admitting: Internal Medicine

## 2021-10-03 DIAGNOSIS — J929 Pleural plaque without asbestos: Secondary | ICD-10-CM | POA: Diagnosis not present

## 2021-10-03 DIAGNOSIS — R6884 Jaw pain: Secondary | ICD-10-CM | POA: Diagnosis not present

## 2021-10-03 DIAGNOSIS — K3189 Other diseases of stomach and duodenum: Secondary | ICD-10-CM | POA: Diagnosis not present

## 2021-10-03 DIAGNOSIS — K6389 Other specified diseases of intestine: Secondary | ICD-10-CM | POA: Diagnosis not present

## 2021-10-03 DIAGNOSIS — Z9049 Acquired absence of other specified parts of digestive tract: Secondary | ICD-10-CM | POA: Diagnosis not present

## 2021-10-03 DIAGNOSIS — R591 Generalized enlarged lymph nodes: Secondary | ICD-10-CM | POA: Insufficient documentation

## 2021-10-03 DIAGNOSIS — R599 Enlarged lymph nodes, unspecified: Secondary | ICD-10-CM | POA: Diagnosis not present

## 2021-10-03 DIAGNOSIS — R590 Localized enlarged lymph nodes: Secondary | ICD-10-CM | POA: Diagnosis not present

## 2021-10-03 DIAGNOSIS — N83202 Unspecified ovarian cyst, left side: Secondary | ICD-10-CM | POA: Diagnosis not present

## 2021-10-03 DIAGNOSIS — E890 Postprocedural hypothyroidism: Secondary | ICD-10-CM | POA: Diagnosis not present

## 2021-10-03 DIAGNOSIS — J984 Other disorders of lung: Secondary | ICD-10-CM | POA: Diagnosis not present

## 2021-10-03 MED ORDER — IOHEXOL 350 MG/ML SOLN
100.0000 mL | Freq: Once | INTRAVENOUS | Status: AC | PRN
  Administered 2021-10-03: 100 mL via INTRAVENOUS

## 2021-10-03 NOTE — Telephone Encounter (Signed)
On 1/23- Unable to reach pt; I left VM with details of the finding on CT scan- sub cm /unchanged LN in neck. And No obvious LN noted on CT A/P. Pt will follow up with Dr.Patel/Rheumatology for now; follow up with [place] only as needed.  Recommend call us if any questions.GB  FYI-heather/Caren.

## 2021-10-04 ENCOUNTER — Other Ambulatory Visit: Payer: Self-pay | Admitting: Internal Medicine

## 2021-10-04 ENCOUNTER — Encounter: Payer: Self-pay | Admitting: Internal Medicine

## 2021-10-04 DIAGNOSIS — Z1231 Encounter for screening mammogram for malignant neoplasm of breast: Secondary | ICD-10-CM

## 2021-10-05 ENCOUNTER — Other Ambulatory Visit: Payer: Self-pay

## 2021-10-05 ENCOUNTER — Ambulatory Visit
Admission: RE | Admit: 2021-10-05 | Discharge: 2021-10-05 | Disposition: A | Discharge status: Home / Self Care | Payer: 59 | Source: Ambulatory Visit | Attending: Internal Medicine | Admitting: Internal Medicine

## 2021-10-05 DIAGNOSIS — Z1231 Encounter for screening mammogram for malignant neoplasm of breast: Secondary | ICD-10-CM | POA: Diagnosis not present

## 2021-10-06 ENCOUNTER — Ambulatory Visit: Payer: 59 | Admitting: Infectious Diseases

## 2021-10-07 ENCOUNTER — Other Ambulatory Visit: Payer: Self-pay

## 2021-10-07 DIAGNOSIS — R399 Unspecified symptoms and signs involving the genitourinary system: Secondary | ICD-10-CM | POA: Diagnosis not present

## 2021-10-07 DIAGNOSIS — Z889 Allergy status to unspecified drugs, medicaments and biological substances status: Secondary | ICD-10-CM | POA: Diagnosis not present

## 2021-10-07 DIAGNOSIS — N390 Urinary tract infection, site not specified: Secondary | ICD-10-CM | POA: Diagnosis not present

## 2021-10-07 DIAGNOSIS — A499 Bacterial infection, unspecified: Secondary | ICD-10-CM | POA: Diagnosis not present

## 2021-10-07 MED ORDER — CEFDINIR 300 MG PO CAPS
ORAL_CAPSULE | ORAL | 0 refills | Status: DC
  Filled 2021-10-07: qty 14, 7d supply, fill #0

## 2021-10-10 DIAGNOSIS — E890 Postprocedural hypothyroidism: Secondary | ICD-10-CM | POA: Diagnosis not present

## 2021-10-11 ENCOUNTER — Ambulatory Visit: Payer: 59 | Admitting: Infectious Diseases

## 2021-10-14 ENCOUNTER — Encounter: Payer: 59 | Admitting: Family Medicine

## 2021-10-17 ENCOUNTER — Other Ambulatory Visit (HOSPITAL_COMMUNITY): Payer: Self-pay

## 2021-10-17 DIAGNOSIS — E890 Postprocedural hypothyroidism: Secondary | ICD-10-CM | POA: Diagnosis not present

## 2021-10-20 ENCOUNTER — Ambulatory Visit: Payer: 59 | Attending: Infectious Diseases | Admitting: Infectious Diseases

## 2021-10-20 ENCOUNTER — Encounter: Payer: Self-pay | Admitting: Infectious Diseases

## 2021-10-20 ENCOUNTER — Other Ambulatory Visit
Admission: RE | Admit: 2021-10-20 | Discharge: 2021-10-20 | Disposition: A | Discharge status: Home / Self Care | Payer: 59 | Attending: Infectious Diseases | Admitting: Infectious Diseases

## 2021-10-20 ENCOUNTER — Other Ambulatory Visit: Payer: Self-pay

## 2021-10-20 VITALS — BP 123/83 | HR 65 | Temp 98.0°F | Wt 241.0 lb

## 2021-10-20 DIAGNOSIS — R140 Abdominal distension (gaseous): Secondary | ICD-10-CM | POA: Insufficient documentation

## 2021-10-20 DIAGNOSIS — R591 Generalized enlarged lymph nodes: Secondary | ICD-10-CM | POA: Diagnosis not present

## 2021-10-20 DIAGNOSIS — R5383 Other fatigue: Secondary | ICD-10-CM | POA: Insufficient documentation

## 2021-10-20 DIAGNOSIS — M25462 Effusion, left knee: Secondary | ICD-10-CM | POA: Diagnosis not present

## 2021-10-20 DIAGNOSIS — M459 Ankylosing spondylitis of unspecified sites in spine: Secondary | ICD-10-CM | POA: Insufficient documentation

## 2021-10-20 DIAGNOSIS — H538 Other visual disturbances: Secondary | ICD-10-CM | POA: Insufficient documentation

## 2021-10-20 DIAGNOSIS — R002 Palpitations: Secondary | ICD-10-CM | POA: Insufficient documentation

## 2021-10-20 DIAGNOSIS — R300 Dysuria: Secondary | ICD-10-CM | POA: Insufficient documentation

## 2021-10-20 DIAGNOSIS — M25461 Effusion, right knee: Secondary | ICD-10-CM | POA: Insufficient documentation

## 2021-10-20 DIAGNOSIS — E890 Postprocedural hypothyroidism: Secondary | ICD-10-CM | POA: Insufficient documentation

## 2021-10-20 DIAGNOSIS — Z87891 Personal history of nicotine dependence: Secondary | ICD-10-CM | POA: Insufficient documentation

## 2021-10-20 DIAGNOSIS — M25569 Pain in unspecified knee: Secondary | ICD-10-CM | POA: Diagnosis not present

## 2021-10-20 LAB — FERRITIN: Ferritin: 66 ng/mL (ref 11–307)

## 2021-10-20 NOTE — Patient Instructions (Addendum)
You are here for cervical adenopathy, body ache, non specific rash, fatigue and pain legs I will do a few tests to see whether any infection can be causing this. Will let you know of the tests

## 2021-10-20 NOTE — Progress Notes (Signed)
NAME: Hailey [name]  DOB: 1976-07-14  MRN: [id number]  Date/Time: 10/20/2021 10:40 AM  Subjective:  Patient was referred to me by Dr. Posey Pronto rheumatologist for lymphadenopathy ? Hailey [name] is a 46 y.o. with a history of hypothyroidism total thyroidectomy in 2005 for high thyroid medicine and goiter, ankylosing spondylitis with HLA B27 positive, SI joint with sacroiliitis, enthesitis is referred to me by the rheumatologist for lymphadenopathy For the past 5 years patient has been complaining of spectrum of symptoms 1) frequent left-sided cervical lymph node enlargement and remission, 2 feedings of soft tissue fullness and lumps on her chest hands Intermittent diarrhea Pain in her knee joints Palpitation Can become pale Has random skin lesions that come and go  Fatigue Dysuria Blurred vision Bloating She used to take medicines for ankylosing spondylitis but currently on no treatment.  In the past she has taken Enbrel, Humira Cosentyx and methotrexate.  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 She has no travel outside the country She has 1 dog at home She lives with her husband and her 2 older Ex-smoker No outdoor activities like swimming tracking. No contact with animals Her medications include Synthroid, Cytomel, ibuprofen and Tylenol her labs and images have included the following On 10/03/2021 CT chest and abdomen and pelvis showing no enlarged mediastinal, hilar or axillary lymph nodes.  Thyroid gland trachea esophagus normal 2.  Seen in the left lower lobe liver likely reflecting sequelae of prior infection. Mild diffuse bronchial wall thickening with some areas of mosaic attenuation of the lungs possibly reflecting small airway disease Mild biapical pleuroparenchymal scarring  In the abdomen there was moderate volume of formed stool throughout the colon suggestive of constipation No pathologically enlarged abdominal,  pelvic or inguinal lymph nodes CT scanning of the neck showed scattered subcentimeter cervical chain lymph nodes are noted Bilaterally unchanged since 2020.  The size of which is 6 mm to 7 mm.  Labs 09/23/2021 Hb 12.3 WBC 8.5 Platelet 372 Sodium 133 BUN 14 Creatinine 0.51 Alkaline phosphatase 94 Albumin 4.1 AST 21 ALT 23 LDH 123 CRP 1.1 11/26/2020 CCP antibody, Rh factor, Sjogren's antibody serum electrophoresis, QuantiFERON-TB gold, ANA, viral hepatitis screen were all negative.  Lyme and angiotensin-converting enzyme were also negative.  ANCA panel was negative.  HLA-B27 was positive 10/10/2021 TSH 5.297  Past Medical History:  Diagnosis Date   Allergic state    Allergy    Ankylosing spondylitis ([place])    followed by rheumatology Dr. Posey Pronto   Arthritis    Asthma    Cervical disc disease    Depression    Enlarged lymph nodes    GERD (gastroesophageal reflux disease)    H/O colonoscopy    HPV (human papilloma virus) infection    Hypothyroidism    Interstitial cystitis    LGSIL (low grade squamous intraepithelial dysplasia)    Paresthesia    Status post laparoscopic cholecystectomy 07/13/2016    Past Surgical History:  Procedure Laterality Date   BREAST BIOPSY Right 10/18/2015   bx/clip- neg   CHOLECYSTECTOMY N/A 07/07/2016   Procedure: LAPAROSCOPIC CHOLECYSTECTOMY;  Surgeon: Olean Ree, MD;  Location: [facility] ORS;  Service: General;  Laterality: N/A;   COLONOSCOPY WITH PROPOFOL N/A 10/11/2015   Procedure: COLONOSCOPY WITH PROPOFOL;  Surgeon: Lollie Sails, MD;  Location: [facility] ENDOSCOPY;  Service: Endoscopy;  Laterality: N/A;   ESOPHAGOGASTRODUODENOSCOPY (EGD) WITH PROPOFOL N/A 10/11/2015   Procedure: ESOPHAGOGASTRODUODENOSCOPY (EGD) WITH PROPOFOL;  Surgeon: Hassell Done  Kassie Mends, MD;  Location: [facility] ENDOSCOPY;  Service: Endoscopy;  Laterality: N/A;   TONSILLECTOMY     TOTAL THYROIDECTOMY  2005   goiter    Social History   Socioeconomic History   Marital status:  Married    Spouse name: Not on file   Number of children: 1   Years of education: Not on file   Highest education level: Not on file  Occupational History   Not on file  Tobacco Use   Smoking status: Former    Packs/day: 0.50    Years: 7.00    Pack years: 3.50    Types: Cigarettes    Quit date: 2003    Years since quitting: 20.1    Passive exposure: Never   Smokeless tobacco: Never  Vaping Use   Vaping Use: Never used  Substance and Sexual Activity   Alcohol use: Yes    Comment: Occ.   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    Social Determinants of Health   Financial Resource Strain: Not on file  Food Insecurity: Not on file  Transportation Needs: Not on file  Physical Activity: Not on file  Stress: Not on file  Social Connections: Not on file  Intimate Partner Violence: Not on file    Family History  Problem Relation Age of Onset   Cancer Mother        Vulva cancer   Hypertension Mother    Stroke Mother    Goiter Mother    Diabetes Father    Hypertension Father    Hypertension Brother    Bladder Cancer Maternal Aunt    Hypertension Maternal Grandmother    Stroke Maternal Grandmother    Diabetes Maternal Grandmother    Breast cancer Cousin 2       pat cousin   Allergies  Allergen Reactions   Hydrocodone-Acetaminophen Other (See Comments)    Cannot urinate on medication   Lodine [Etodolac] Shortness Of Breath   Quetiapine Other (See Comments)    Excessive sedation at 50 mg.   Zanaflex [Tizanidine Hcl]    Ciprofloxacin Hives   Escitalopram Other (See Comments)    Intolerant.   Latex Rash   Levaquin [Levofloxacin In D5w] Rash    HIVES   Sulfa Antibiotics Other (See Comments)    Can't remember reactions   I? Current Outpatient Medications  Medication Sig  Dispense Refill   albuterol (VENTOLIN HFA) 108 (90 Base) MCG/ACT inhaler Inhale 2 puffs into the lungs every 6 (six) hours as needed for wheezing or shortness of breath. 54 g 0   clobetasol (TEMOVATE) 0.05 % GEL Apply twice daily 60 g 3   Fluticasone-Salmeterol (ADVAIR) 100-50 MCG/DOSE AEPB Inhale 1 puff into the lungs 2 (two) times daily. (Patient taking differently: Inhale 1 puff into the lungs 2 (two) times daily as needed.) 3 each 3   hydrocortisone 2.5 % cream      ketoconazole (NIZORAL) 2 % cream 1(one) application(s) topical 2(two) times a day. Apply to Pelvis 60 g 2   levothyroxine (SYNTHROID) 175 MCG tablet Take 1 tablet (175 mcg total) by mouth daily. 90 tablet 1   liothyronine (CYTOMEL) 5 MCG tablet Take 1 tablet (5 mcg total) by mouth once daily 90 tablet 1   nystatin-triamcinolone ointment (MYCOLOG) Apply  topically 2 (two) times daily 30 g 2   oxybutynin (DITROPAN) 5 MG tablet TAKE 1 TABLET BY MOUTH 3 TIMES DAILY 270 tablet 3   traZODone (DESYREL) 100 MG tablet Take 1 tablet (100 mg total) by mouth at bedtime for 30 days 30 tablet 2   cefdinir (OMNICEF) 300 MG capsule Take 1 capsule (300 mg total) by mouth 2 (two) times daily for 7 days (Patient not taking: Reported on 10/20/2021) 14 capsule 0   celecoxib (CELEBREX) 100 MG capsule Take 1 capsule (100 mg total) by mouth 2 (two) times daily for 30 days (Patient not taking: Reported on 10/20/2021) 60 capsule 2   folic acid (FOLVITE) 1 MG tablet TAKE 1 TABLET (1 MG TOTAL) BY MOUTH ONCE DAILY (Patient not taking: Reported on 10/20/2021) 90 tablet 3   gabapentin (NEURONTIN) 300 MG capsule TAKE 1 CAPSULE BY MOUTH AT BEDTIME. (Patient not taking: Reported on 10/20/2021) 90 capsule 3   influenza vac split quadrivalent PF (FLUARIX QUADRIVALENT) 0.5 ML injection Inject into the muscle. (Patient not taking: Reported on 10/20/2021) 0.5 mL 0   ondansetron (ZOFRAN-ODT) 4 MG disintegrating tablet Take 1 tablet (4 mg total) by mouth every 8 (eight) hours as needed  for Nausea or Vomiting (Patient not taking: Reported on 10/20/2021) 20 tablet 0   sulfaSALAzine (AZULFIDINE) 500 MG EC tablet Take 1 tablet (500 mg total) by mouth 2 (two) times daily (Patient not taking: Reported on 10/20/2021) 60 tablet 3   terbinafine (LAMISIL) 250 MG tablet 1(one) tablet(s) oral every day (Patient not taking: Reported on 10/20/2021) 14 tablet 0   traMADol (ULTRAM) 50 MG tablet Take 1 tablet (50 mg total) by mouth 2 (two) times daily as needed for Pain for up to 7 days (Patient not taking: Reported on 10/20/2021) 14 tablet 0   Upadacitinib ER (RINVOQ) 15 MG TB24 take 1 tablet by mouth daily as directed (Patient not taking: Reported on 10/20/2021) 30 tablet 11   No current facility-administered medications for this visit.     Abtx:  Anti-infectives (From admission, onward)    None       REVIEW OF SYSTEMS:  Const: negative fever, positive chills, negative weight loss rather weight gain Eyes: negative diplopia or visual changes, negative eye pain can have blurred vision sometime ENT: negative coryza, negative sore throat Resp: Occasional cough, no hemoptysis, has dyspnea on exertion Cards: negative for chest pain, has palpitations, lower extremity edema GU: dysuria  GI: Bloating, diarrhea alternating with constipation skin: Random rash Heme: negative for easy bruising and gum/nose bleeding MS: Arthralgia, myalgia Neurolo:negative for headaches, has dizziness, vertigo, memory problems  Psych:  anxiety, depression  Endocrine: n had a hyperparathyroidism and underwent thyroidectomy on supplement allergy/Immunology-multiple allergies Pertinent to infectious diseases quinolone and Bactrim  Objective:  VITALS:  BP 123/83    Pulse 65    Temp 98 F (36.7 C) (Oral)    Wt 241 lb (109.3 kg)    SpO2 98%    BMI 40.10 kg/m  PHYSICAL EXAM:  General: Alert, cooperative, gets tearful when she talks about her symptoms  head: Normocephalic, without obvious abnormality, atraumatic. Eyes:  Conjunctivae clear, anicteric sclerae. Pupils are equal ENT Nares normal. No drainage or sinus tenderness. Lips, mucosa, and tongue normal. No Thrush Neck: Supple, symmetrical, less than 1 cm lymph node felt on the posterior cervical chain on the left side  no carotid bruit and no JVD. Bilateral supraclavicular pad of fat.  Unable to palpate any lymph nodes Back: No CVA  tenderness. Lungs: Clear to auscultation bilaterally. No Wheezing or Rhonchi. No rales. Heart: Regular rate and rhythm, no murmur, rub or gallop. Abdomen: Soft, non-tender,not distended. Bowel sounds normal. No masses Extremities: atraumatic, no cyanosis. No edema. No clubbing Skin: Nonspecific papules over the right upper thigh laterally.  And point excoriation on the second toe of the right foot Lymph: Cervical, supraclavicular normal. Neurologic: Grossly non-focal Pertinent Labs Lab Results CBC    Component Value Date/Time   WBC 8.5 09/23/2021 1220   RBC 4.07 09/23/2021 1220   HGB 12.3 09/23/2021 1220   HGB 13.1 09/15/2019 1003   HCT 35.9 (L) 09/23/2021 1220   HCT 40.1 09/15/2019 1003   PLT 372 09/23/2021 1220   PLT 343 09/15/2019 1003   MCV 88.2 09/23/2021 1220   MCV 89 09/15/2019 1003   MCV 87 08/27/2014 1257   MCH 30.2 09/23/2021 1220   MCHC 34.3 09/23/2021 1220   RDW 12.8 09/23/2021 1220   RDW 13.8 09/15/2019 1003   RDW 13.6 08/27/2014 1257   LYMPHSABS 3.1 09/23/2021 1220   LYMPHSABS 3.2 (H) 09/15/2019 1003   LYMPHSABS 3.4 08/27/2014 1257   MONOABS 0.8 09/23/2021 1220   MONOABS 1.1 (H) 08/27/2014 1257   EOSABS 0.5 09/23/2021 1220   EOSABS 0.4 09/15/2019 1003   EOSABS 0.3 08/27/2014 1257   BASOSABS 0.1 09/23/2021 1220   BASOSABS 0.1 09/15/2019 1003   BASOSABS 0.1 08/27/2014 1257    CMP Latest Ref Rng & Units 09/23/2021 07/07/2021 01/14/2021  Glucose 70 - 99 mg/dL 108(H) 97 105(H)  BUN 6 - 20 mg/dL _0 Creatinine 0.44 - 1.00 mg/dL 0.51 0.69 0.68  Sodium 135 - 145 mmol/L 133(L) 139 137   Potassium 3.5 - 5.1 mmol/L 3.7 4.8 4.6  Chloride 98 - 111 mmol/L 104 102 105  CO2 22 - 32 mmol/L _1 Calcium 8.9 - 10.3 mg/dL 8.8(L) 9.5 9.1  Total Protein 6.5 - 8.1 g/dL 7.4 - 7.5  Total Bilirubin 0.3 - 1.2 mg/dL 0.4 - 0.7  Alkaline Phos 38 - 126 U/L 94 - 86  AST 15 - 41 U/L 21 - 18  ALT 0 - 44 U/L 23 - 15   As above IMAGING RESULTS: I have personally reviewed the films ? Impression/Recommendation Patient with history of ankylosing spondylitis HLA-B27 positive, currently hypothyroidism following thyroidectomy and on supplements presents with a constellation of symptoms which includes cervical lymphadenopathy and lumps and bumps felt for her skin which she thinks could be lymph nodes Intermittent diarrhea Fatigue Arthralgias and swelling especially the knees Poor sleep Blurred vision occasionally Palpitations On examination positive finding is less than 1 cm lymph node on the left cervical posterior chain Nonspecific papules over her right upper thigh Lab wise she has had HLA-B27 positive.  Pretty normal inflammatory markers like ESR and CRP  Autoimmune versus fibromyalgia versus questionable infection.  There is no significant lymphadenopathy or other symptoms to suggest infections like Whipple's disease or toxoplasmosis. Toxoplasma, EBV, RPR will be checked today. She had a colonoscopy in 2017.  If she continues to have intermittent bowel symptoms like diarrhea then may think about colonoscopy for an upper GI small bowel study to look for whipple's The lymph nodes look reactive as she is concerned with intermittent exacerbation we may try to get lymph node for biopsy during one of the exacerbation.  Kikuchi disease causes lymphadenopathy but she does not have any significant lymph nodes to explain that currently   I am not sure whether  this is in relation to her hypothyroidism.  Question perimenopausal state this with the patient in detail. Explained to her that we sometimes  will not be able t to get a diagnosis but we can exclude a few conditions I will let her know the test results.  If anything is positive then I may discuss treatment otherwise I will discharge her from my clinic. Discussed with patient in great detail. Note:  This document was prepared using Dragon voice recognition software and may include unintentional dictation errors.

## 2021-10-21 LAB — RPR: RPR Ser Ql: NONREACTIVE

## 2021-10-21 LAB — EPSTEIN-BARR VIRUS (EBV) ANTIBODY PROFILE
EBV NA IgG: >600 U/mL — ABNORMAL HIGH (ref 0.0–17.9)
EBV VCA IgG: 83.6 U/mL — ABNORMAL HIGH (ref 0.0–17.9)
EBV VCA IgM: <36 U/mL (ref 0.0–35.9)

## 2021-10-22 ENCOUNTER — Encounter: Payer: Self-pay | Admitting: Infectious Diseases

## 2021-10-22 DIAGNOSIS — G5621 Lesion of ulnar nerve, right upper limb: Secondary | ICD-10-CM | POA: Diagnosis not present

## 2021-10-22 LAB — MISC LABCORP TEST (SEND OUT): Labcorp test code: 6494

## 2021-10-22 LAB — TOXOPLASMA ANTIBODIES- IGG AND  IGM
Toxoplasma Antibody- IgM: <3 AU/mL (ref 0.0–7.9)
Toxoplasma IgG Ratio: <3 IU/mL (ref 0.0–7.1)

## 2021-10-24 ENCOUNTER — Other Ambulatory Visit: Payer: Self-pay

## 2021-10-24 LAB — EPSTEIN BARR VRS(EBV DNA BY PCR): EBV DNA QN by PCR: NEGATIVE IU/mL

## 2021-10-24 MED ORDER — METHYLPREDNISOLONE 4 MG PO TBPK
ORAL_TABLET | ORAL | 0 refills | Status: DC
  Filled 2021-10-24: qty 21, 6d supply, fill #0

## 2021-10-24 MED ORDER — MELOXICAM 15 MG PO TABS
ORAL_TABLET | ORAL | 0 refills | Status: DC
  Filled 2021-10-24: qty 30, 30d supply, fill #0

## 2021-10-26 ENCOUNTER — Other Ambulatory Visit: Payer: Self-pay

## 2021-10-26 MED ORDER — AMOXICILLIN 500 MG PO CAPS
500.0000 mg | ORAL_CAPSULE | Freq: Four times a day (QID) | ORAL | 0 refills | Status: DC
  Filled 2021-10-26: qty 28, 7d supply, fill #0

## 2021-10-26 MED ORDER — HYDROCODONE-ACETAMINOPHEN 5-325 MG PO TABS
ORAL_TABLET | ORAL | 0 refills | Status: DC
  Filled 2021-10-26: qty 8, 2d supply, fill #0

## 2021-11-09 ENCOUNTER — Other Ambulatory Visit (HOSPITAL_COMMUNITY): Payer: Self-pay

## 2021-12-05 ENCOUNTER — Other Ambulatory Visit (HOSPITAL_COMMUNITY): Payer: Self-pay

## 2021-12-05 ENCOUNTER — Other Ambulatory Visit: Payer: Self-pay

## 2021-12-05 DIAGNOSIS — M457 Ankylosing spondylitis of lumbosacral region: Secondary | ICD-10-CM | POA: Diagnosis not present

## 2021-12-05 DIAGNOSIS — Z79899 Other long term (current) drug therapy: Secondary | ICD-10-CM | POA: Diagnosis not present

## 2021-12-05 DIAGNOSIS — H5712 Ocular pain, left eye: Secondary | ICD-10-CM | POA: Diagnosis not present

## 2021-12-05 DIAGNOSIS — H2000 Unspecified acute and subacute iridocyclitis: Secondary | ICD-10-CM | POA: Diagnosis not present

## 2021-12-05 DIAGNOSIS — M170 Bilateral primary osteoarthritis of knee: Secondary | ICD-10-CM | POA: Diagnosis not present

## 2021-12-05 MED ORDER — PREDNISOLONE ACETATE 1 % OP SUSP
OPHTHALMIC | 0 refills | Status: DC
  Filled 2021-12-05: qty 10, 10d supply, fill #0

## 2021-12-06 ENCOUNTER — Other Ambulatory Visit (HOSPITAL_COMMUNITY): Payer: Self-pay

## 2021-12-06 DIAGNOSIS — M170 Bilateral primary osteoarthritis of knee: Secondary | ICD-10-CM | POA: Diagnosis not present

## 2021-12-06 DIAGNOSIS — Z79899 Other long term (current) drug therapy: Secondary | ICD-10-CM | POA: Diagnosis not present

## 2021-12-06 DIAGNOSIS — H5712 Ocular pain, left eye: Secondary | ICD-10-CM | POA: Diagnosis not present

## 2021-12-06 DIAGNOSIS — M457 Ankylosing spondylitis of lumbosacral region: Secondary | ICD-10-CM | POA: Diagnosis not present

## 2021-12-12 DIAGNOSIS — H2000 Unspecified acute and subacute iridocyclitis: Secondary | ICD-10-CM | POA: Diagnosis not present

## 2022-01-12 ENCOUNTER — Ambulatory Visit: Payer: 59 | Attending: Internal Medicine | Admitting: Pharmacist

## 2022-01-12 ENCOUNTER — Telehealth: Payer: Self-pay | Admitting: Pharmacist

## 2022-01-12 ENCOUNTER — Other Ambulatory Visit (HOSPITAL_COMMUNITY): Payer: Self-pay

## 2022-01-12 ENCOUNTER — Other Ambulatory Visit: Payer: Self-pay

## 2022-01-12 DIAGNOSIS — Z7189 Other specified counseling: Secondary | ICD-10-CM

## 2022-01-12 MED ORDER — RINVOQ 15 MG PO TB24
ORAL_TABLET | ORAL | 11 refills | Status: DC
  Filled 2022-01-12: qty 30, 30d supply, fill #0
  Filled 2022-02-22: qty 30, 30d supply, fill #1

## 2022-01-12 NOTE — Telephone Encounter (Signed)
Called patient to schedule an appointment for the [facility]. I was unable to reach the patient so I left a HIPAA-compliant message requesting that the patient return my call.   Luke Van Ausdall, PharmD, BCACP, CPP Clinical Pharmacist [facility] [phone]  

## 2022-01-12 NOTE — Progress Notes (Signed)
?  S: ?Patient presents today for review of their specialty medication.  ? ?Patient is about to start taking Rinvoq for ankylosing spondylitis. Patient is managed by Dr. Posey Pronto for this.  ? ?Dosing: Adult  ?Note: May be used as monotherapy or in combination with methotrexate or other nonbiologic disease-modifying antirheumatic drugs (DMARDs); use in combination with biologic DMARDS or potent immunosuppressants (eg, azathioprine, cyclosporine) is not recommended. Do not initiate therapy in patients with an absolute lymphocyte count <500/mm3, ANC <1,000/mm3, or hemoglobin <8 g/dL. ?Rheumatoid arthritis: Oral: 15 mg once daily. ? ?Adherence:  has not yet started  ? ?Efficacy:  has not yet started  ? ?Monitoring: ?S/sx thromboembolism: none ?S/sx malignancy: none ?S/sx of infection: none ? ?Current adverse effects: none ? ? ?O: ?   ? ?Lab Results  ?Component Value Date  ? WBC 8.5 09/23/2021  ? HGB 12.3 09/23/2021  ? HCT 35.9 (L) 09/23/2021  ? MCV 88.2 09/23/2021  ? PLT 372 09/23/2021  ? ? ?  Chemistry   ?   ?Component Value Date/Time  ? NA 133 (L) 09/23/2021 1220  ? NA 139 07/07/2021 1347  ? NA 138 08/27/2014 1257  ? K 3.7 09/23/2021 1220  ? K 3.7 08/27/2014 1257  ? CL 104 09/23/2021 1220  ? CL 104 08/27/2014 1257  ? CO2 24 09/23/2021 1220  ? CO2 23 08/27/2014 1257  ? BUN 14 09/23/2021 1220  ? BUN 11 07/07/2021 1347  ? BUN 8 08/27/2014 1257  ? CREATININE 0.51 09/23/2021 1220  ? CREATININE 0.69 08/27/2014 1257  ? GLU 94 12/05/2019 0000  ?    ?Component Value Date/Time  ? CALCIUM 8.8 (L) 09/23/2021 1220  ? CALCIUM 8.8 08/27/2014 1257  ? ALKPHOS 94 09/23/2021 1220  ? AST 21 09/23/2021 1220  ? ALT 23 09/23/2021 1220  ? BILITOT 0.4 09/23/2021 1220  ? BILITOT 0.3 09/15/2019 1003  ?  ? ? ? ?Lab Results  ?Component Value Date  ? CHOL 181 09/15/2019  ? HDL 70 09/15/2019  ? [place] 91 09/15/2019  ? TRIG 116 09/15/2019  ? CHOLHDL 2.6 09/15/2019  ? ? ? ?A/P: ?1. Medication review: patient currently prescribed Rinvoq for rheumatoid  arthritis. Reviewed the medication with the patient, including the following: Rinvoq is a medication used to treat rheumatoid arthritis. Administer with or without food. Swallow tablet whole; do not crush, split, or chew. Possible adverse effects include increased risk of infection, GI upset, hematologic toxicity, hepatic effects, lipid abnormalities, increased risk of malignancy, thromboembolism. Avoid live vaccinations. No recommendations for any change. ? ?Benard Halsted, PharmD, BCACP, CPP ?Clinical Pharmacist ?[place] ?[phone] ? ? ? ?

## 2022-01-16 ENCOUNTER — Other Ambulatory Visit: Payer: Self-pay

## 2022-01-16 ENCOUNTER — Other Ambulatory Visit (HOSPITAL_COMMUNITY): Payer: Self-pay

## 2022-01-17 ENCOUNTER — Other Ambulatory Visit: Payer: Self-pay

## 2022-01-17 MED ORDER — LEVOTHYROXINE SODIUM 175 MCG PO TABS
ORAL_TABLET | ORAL | 3 refills | Status: DC
  Filled 2022-01-17 – 2022-06-15 (×2): qty 90, 90d supply, fill #0
  Filled 2022-10-04: qty 90, 90d supply, fill #1

## 2022-01-26 ENCOUNTER — Other Ambulatory Visit: Payer: Self-pay

## 2022-01-26 ENCOUNTER — Ambulatory Visit
Admission: EM | Admit: 2022-01-26 | Discharge: 2022-01-26 | Disposition: A | Discharge status: Home / Self Care | Payer: 59 | Attending: Physician Assistant | Admitting: Physician Assistant

## 2022-01-26 DIAGNOSIS — M2550 Pain in unspecified joint: Secondary | ICD-10-CM

## 2022-01-26 DIAGNOSIS — M458 Ankylosing spondylitis sacral and sacrococcygeal region: Secondary | ICD-10-CM | POA: Diagnosis not present

## 2022-01-26 MED ORDER — METHYLPREDNISOLONE SODIUM SUCC 125 MG IJ SOLR
60.0000 mg | Freq: Once | INTRAMUSCULAR | Status: AC
  Administered 2022-01-26: 60 mg via INTRAMUSCULAR

## 2022-01-26 MED ORDER — TRAMADOL HCL 50 MG PO TABS
50.0000 mg | ORAL_TABLET | Freq: Four times a day (QID) | ORAL | 0 refills | Status: AC | PRN
  Filled 2022-01-26: qty 12, 3d supply, fill #0

## 2022-01-26 MED ORDER — KETOROLAC TROMETHAMINE 60 MG/2ML IM SOLN
30.0000 mg | Freq: Once | INTRAMUSCULAR | Status: AC
  Administered 2022-01-26: 30 mg via INTRAMUSCULAR

## 2022-01-26 NOTE — ED Triage Notes (Signed)
Patient presents to Urgent Care with complaints of hip and knee joint pain since last week. Has a hx of Arthritis. Treating symptoms with tylenol/advil.

## 2022-01-26 NOTE — Discharge Instructions (Addendum)
-  You have been given injections of Solu-Medrol and ketorolac today. - Have also sent tramadol to pharmacy.  May continue Tylenol. - Follow-up with rheumatology as scheduled.  And continue the Rinvoq

## 2022-01-26 NOTE — ED Provider Notes (Signed)
[facility]    CSN: [id number] Arrival date & time: 01/26/22  0858      History   Chief Complaint Chief Complaint  Patient presents with   Joint Pain    HPI Hailey [name] is a 46 y.o. female with history of ankylosing spondylitis polyarthralgias.  Patient says the AKI is not in her spine yet but she experiences multiple joint pains.  Patient has frequent flare ups, about 2 weeks out of every month. Currently having pain in knees, hips, SI joints.  Reports deep aching pain.  Patient also reports occasional swelling and fatigue.  Patient says pain is worse at night. Recently start Rinvoq  2 weeks ago. Patient says she has been taking 2 Motrin and 2 Tylenol with minimal improvement in symptoms. She says that she has been given tramadol in the past which has helped her pain at nighttime.  She does not have any currently.  Patient is hoping to have a corticosteroid injection today as she has had in the past and says it has been helpful.  She does not want to take oral corticosteroids at this time.  Patient follows with rheumatology.  No other complaints.   HPI  Past Medical History:  Diagnosis Date   Allergic state    Allergy    Ankylosing spondylitis ([place])    followed by rheumatology Dr. Posey Pronto   Arthritis    Asthma    Cervical disc disease    Depression    Enlarged lymph nodes    GERD (gastroesophageal reflux disease)    H/O colonoscopy    HPV (human papilloma virus) infection    Hypothyroidism    Interstitial cystitis    LGSIL (low grade squamous intraepithelial dysplasia)    Paresthesia    Status post laparoscopic cholecystectomy 07/13/2016    Patient Active Problem List   Diagnosis Date Noted   Generalized lymphadenopathy 09/23/2021   B12 deficiency 09/23/2021   Fatigue 09/23/2021   Primary osteoarthritis of both knees 04/29/2021   Ankylosing spondylitis ([place]) 12/30/2018   Asthma in adult without complication 76/19/5093   Chronic diarrhea 12/30/2018    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   Interstitial cystitis 07/03/2016   Pelvic pain in female 07/03/2016   Chondromalacia of knee, 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   BMI 38.0-38.9,adult 02/14/2016   Polyarthralgia 02/14/2016   GERD (gastroesophageal reflux disease) 02/10/2014    Past Surgical History:  Procedure Laterality Date   BREAST BIOPSY Right 10/18/2015   bx/clip- neg   CHOLECYSTECTOMY N/A 07/07/2016   Procedure: LAPAROSCOPIC CHOLECYSTECTOMY;  Surgeon: Olean Ree, MD;  Location: [facility] ORS;  Service: General;  Laterality: N/A;   COLONOSCOPY WITH PROPOFOL N/A 10/11/2015   Procedure: COLONOSCOPY WITH PROPOFOL;  Surgeon: Lollie Sails, MD;  Location: [facility] ENDOSCOPY;  Service: Endoscopy;  Laterality: N/A;   ESOPHAGOGASTRODUODENOSCOPY (EGD) WITH PROPOFOL N/A 10/11/2015   Procedure: ESOPHAGOGASTRODUODENOSCOPY (EGD) WITH PROPOFOL;  Surgeon: Lollie Sails, MD;  Location: [facility] ENDOSCOPY;  Service: Endoscopy;  Laterality: N/A;   TONSILLECTOMY     TOTAL THYROIDECTOMY  2005   goiter    OB History     Gravida  2   Para  1   Term  1   Preterm      AB  1   Living  1      SAB      IAB      Ectopic      Multiple      Live Births  Home Medications    Prior to Admission medications   Medication Sig Start Date End Date Taking? Authorizing Provider  traMADol (ULTRAM) 50 MG tablet Take 1 tablet (50 mg total) by mouth every 6 (six) hours as needed for up to 3 days. 01/26/22 01/29/22 Yes Danton Clap, PA-C  albuterol (VENTOLIN HFA) 108 (90 Base) MCG/ACT inhaler Inhale 2 puffs into the lungs every 6 (six) hours as needed for wheezing or shortness of breath. 09/15/19   Glean Hess, MD  amoxicillin (AMOXIL) 500 MG capsule Take 1 capsule (500 mg total) by mouth 4 (four) times daily until gone. 10/26/21     cefdinir (OMNICEF) 300 MG capsule Take 1 capsule (300 mg total) by mouth 2 (two) times daily for 7 days Patient not taking: Reported on 10/20/2021 10/07/21      celecoxib (CELEBREX) 100 MG capsule Take 1 capsule (100 mg total) by mouth 2 (two) times daily for 30 days Patient not taking: Reported on 10/20/2021 07/15/21     clobetasol (TEMOVATE) 0.05 % GEL Apply twice daily 04/11/19   Zara Council A, PA-C  Fluticasone-Salmeterol (ADVAIR) 100-50 MCG/DOSE AEPB Inhale 1 puff into the lungs 2 (two) times daily. Patient taking differently: Inhale 1 puff into the lungs 2 (two) times daily as needed. 09/15/19   Glean Hess, MD  gabapentin (NEURONTIN) 300 MG capsule TAKE 1 CAPSULE BY MOUTH AT BEDTIME. Patient not taking: Reported on 10/20/2021 11/12/20 11/12/21  Zara Council A, PA-C  HYDROcodone-acetaminophen (NORCO/VICODIN) 5-325 MG tablet Take 1 tablet by mouht every 4 to 6 hours. Do not exceed 6 tablets in a 24 hour period. 10/26/21     hydrocortisone 2.5 % cream  07/03/19   [provider]  influenza vac split quadrivalent PF (FLUARIX QUADRIVALENT) 0.5 ML injection Inject into the muscle. Patient not taking: Reported on 10/20/2021 07/08/21   Carlyle Basques, MD  ketoconazole (NIZORAL) 2 % cream 1(one) application(s) topical 2(two) times a day. Apply to Pelvis 07/15/21     levothyroxine (SYNTHROID) 175 MCG tablet Take 1 tablet (175 mcg total) by mouth daily. 08/22/21     levothyroxine (SYNTHROID) 175 MCG tablet Take 1 tablet by mouth once daily 01/17/22     liothyronine (CYTOMEL) 5 MCG tablet Take 1 tablet (5 mcg total) by mouth once daily 03/07/21     meloxicam (MOBIC) 15 MG tablet Take 1 tablet every day by oral route with meals. 10/22/21     methylPREDNISolone (MEDROL) 4 MG TBPK tablet Take 1 dose pk by oral route. 10/22/21     nystatin-triamcinolone ointment (MYCOLOG) Apply topically 2 (two) times daily 07/05/21     ondansetron (ZOFRAN-ODT) 4 MG disintegrating tablet Take 1 tablet (4 mg total) by mouth every 8 (eight) hours as needed for Nausea or Vomiting Patient not taking: Reported on 10/20/2021 06/17/21     prednisoLONE acetate (PRED FORTE) 1 %  ophthalmic suspension Apply 1 drop into left eye four times a day 12/05/21     sulfaSALAzine (AZULFIDINE) 500 MG EC tablet Take 1 tablet (500 mg total) by mouth 2 (two) times daily Patient not taking: Reported on 10/20/2021 04/29/21     terbinafine (LAMISIL) 250 MG tablet 1(one) tablet(s) oral every day Patient not taking: Reported on 10/20/2021 07/15/21     traZODone (DESYREL) 100 MG tablet Take 1 tablet (100 mg total) by mouth at bedtime for 30 days 06/30/21     Upadacitinib ER (RINVOQ) 15 MG TB24 Take 1 tablet by mouth daily as directed 01/12/22  Tresa Garter, MD    Family History Family History  Problem Relation Age of Onset   Cancer Mother        Vulva cancer   Hypertension Mother    Stroke Mother    Goiter Mother    Diabetes Father    Hypertension Father    Hypertension Brother    Bladder Cancer Maternal Aunt    Hypertension Maternal Grandmother    Stroke Maternal Grandmother    Diabetes Maternal Grandmother    Breast cancer Cousin 31       pat cousin    Social History Social History   Tobacco Use   Smoking status: Former    Packs/day: 0.50    Years: 7.00    Pack years: 3.50    Types: Cigarettes    Quit date: 2003    Years since quitting: 20.3    Passive exposure: Never   Smokeless tobacco: Never  Vaping Use   Vaping Use: Never used  Substance Use Topics   Alcohol use: Yes    Comment: Occ.   Drug use: No     Allergies   Hydrocodone-acetaminophen, Lodine [etodolac], Quetiapine, Zanaflex [tizanidine hcl], Ciprofloxacin, Escitalopram, Latex, Levaquin [levofloxacin in d5w], and Sulfa antibiotics   Review of Systems Review of Systems  Constitutional:  Positive for fatigue. Negative for fever.  Respiratory:  Negative for shortness of breath.   Cardiovascular:  Negative for chest pain.  Gastrointestinal:  Negative for nausea and vomiting.  Musculoskeletal:  Positive for arthralgias and joint swelling. Negative for gait problem.  Skin:  Negative for color  change and rash.  Neurological:  Negative for weakness and numbness.    Physical Exam Triage Vital Signs ED Triage Vitals  Enc Vitals Group     BP 01/26/22 0912 137/83     Pulse Rate 01/26/22 0912 69     Resp 01/26/22 0912 16     Temp 01/26/22 0912 98 F (36.7 C)     Temp Source 01/26/22 0912 Oral     SpO2 01/26/22 0912 100 %     Weight --      Height --      Head Circumference --      Peak Flow --      Pain Score 01/26/22 0911 8     Pain Loc --      Pain Edu? --      Excl. in [place]? --    No data found.  Updated Vital Signs BP 137/83 (BP Location: Left Arm)   Pulse 69   Temp 98 F (36.7 C) (Oral)   Resp 16   LMP 01/12/2022   SpO2 100%   Physical Exam Vitals and nursing note reviewed.  Constitutional:      General: She is not in acute distress.    Appearance: Normal appearance. She is not ill-appearing or toxic-appearing.  HENT:     Head: Normocephalic and atraumatic.  Eyes:     General: No scleral icterus.       Right eye: No discharge.        Left eye: No discharge.     Conjunctiva/sclera: Conjunctivae normal.  Cardiovascular:     Rate and Rhythm: Normal rate and regular rhythm.     Heart sounds: Normal heart sounds.  Pulmonary:     Effort: Pulmonary effort is normal. No respiratory distress.     Breath sounds: Normal breath sounds.  Musculoskeletal:     Cervical back: Neck supple.     Comments:  Diffuse tenderness palpation of bilateral knees, SI joints and hips.    Skin:    General: Skin is dry.  Neurological:     General: No focal deficit present.     Mental Status: She is alert. Mental status is at baseline.     Motor: No weakness.     Gait: Gait normal.  Psychiatric:        Mood and Affect: Mood normal.        Behavior: Behavior normal.        Thought Content: Thought content normal.     UC Treatments / Results  Labs (all labs ordered are listed, but only abnormal results are displayed) Labs Reviewed - No data to  display  EKG   Radiology No results found.  Procedures Procedures (including critical care time)  Medications Ordered in UC Medications  ketorolac (TORADOL) injection 30 mg (30 mg Intramuscular Given 01/26/22 1014)  methylPREDNISolone sodium succinate (SOLU-MEDROL) 125 mg/2 mL injection 60 mg (60 mg Intramuscular Given 01/26/22 1014)    Initial Impression / Assessment and Plan / UC Course  I have reviewed the triage vital signs and the nursing notes.  Pertinent labs & imaging results that were available during my care of the patient were reviewed by me and considered in my medical decision making (see chart for details).  46 year old female with history of ankylosing spondylosis and polyarthralgias presenting for flareup of pain over the past couple of weeks.  Minimal improvement in symptoms with over-the-counter Motrin and Tylenol.  Patient request corticosteroid injection today.  Vitals are stable.  She is overall well-appearing.  She does have diffuse tenderness throughout bilateral knees, hips and SI joints.  Patient given 60 mg IM Solu-Medrol and 30 mg IM ketorolac.  She has history of allergy to some NSAIDs but has had Toradol before and request to have this as well today because its been very helpful in the past.  Also sent short supply of tramadol after reviewing controlled substance database.  Advised to continue following up with rheumatology or here as needed for any flareups.   Final Clinical Impressions(s) / UC Diagnoses   Final diagnoses:  Polyarthralgia   Discharge Instructions   None    ED Prescriptions     Medication Sig Dispense Auth. Provider   traMADol (ULTRAM) 50 MG tablet Take 1 tablet (50 mg total) by mouth every 6 (six) hours as needed for up to 3 days. 12 tablet Danton Clap, PA-C      I have reviewed the PDMP during this encounter.   Danton Clap, PA-C 01/26/22 1038

## 2022-02-08 ENCOUNTER — Other Ambulatory Visit (HOSPITAL_COMMUNITY): Payer: Self-pay

## 2022-02-10 ENCOUNTER — Other Ambulatory Visit (HOSPITAL_COMMUNITY): Payer: Self-pay

## 2022-02-13 ENCOUNTER — Other Ambulatory Visit (HOSPITAL_COMMUNITY): Payer: Self-pay

## 2022-02-22 ENCOUNTER — Other Ambulatory Visit (HOSPITAL_COMMUNITY): Payer: Self-pay

## 2022-02-22 ENCOUNTER — Other Ambulatory Visit: Payer: Self-pay

## 2022-03-22 ENCOUNTER — Other Ambulatory Visit (HOSPITAL_COMMUNITY): Payer: Self-pay

## 2022-04-04 ENCOUNTER — Ambulatory Visit: Payer: 59 | Admitting: Internal Medicine

## 2022-04-04 ENCOUNTER — Encounter: Payer: Self-pay | Admitting: Internal Medicine

## 2022-04-04 ENCOUNTER — Other Ambulatory Visit
Admission: RE | Admit: 2022-04-04 | Discharge: 2022-04-04 | Disposition: A | Discharge status: Home / Self Care | Payer: 59 | Attending: Internal Medicine | Admitting: Internal Medicine

## 2022-04-04 ENCOUNTER — Other Ambulatory Visit: Payer: Self-pay

## 2022-04-04 VITALS — BP 120/70 | HR 74 | Ht 65.0 in | Wt 247.0 lb

## 2022-04-04 DIAGNOSIS — R1011 Right upper quadrant pain: Secondary | ICD-10-CM | POA: Diagnosis not present

## 2022-04-04 LAB — CBC WITH DIFFERENTIAL/PLATELET
Abs Immature Granulocytes: 0.04 10*3/uL (ref 0.00–0.07)
Basophils Absolute: 0.1 10*3/uL (ref 0.0–0.1)
Basophils Relative: 1 %
Eosinophils Absolute: 0.4 10*3/uL (ref 0.0–0.5)
Eosinophils Relative: 4 %
HCT: 35.9 % — ABNORMAL LOW (ref 36.0–46.0)
Hemoglobin: 12.1 g/dL (ref 12.0–15.0)
Immature Granulocytes: 0 %
Lymphocytes Relative: 29 %
Lymphs Abs: 2.9 10*3/uL (ref 0.7–4.0)
MCH: 29.7 pg (ref 26.0–34.0)
MCHC: 33.7 g/dL (ref 30.0–36.0)
MCV: 88.2 fL (ref 80.0–100.0)
Monocytes Absolute: 1.1 10*3/uL — ABNORMAL HIGH (ref 0.1–1.0)
Monocytes Relative: 11 %
Neutro Abs: 5.4 10*3/uL (ref 1.7–7.7)
Neutrophils Relative %: 55 %
Platelets: 386 10*3/uL (ref 150–400)
RBC: 4.07 MIL/uL (ref 3.87–5.11)
RDW: 14.4 % (ref 11.5–15.5)
WBC: 10 10*3/uL (ref 4.0–10.5)
nRBC: 0 % (ref 0.0–0.2)

## 2022-04-04 MED ORDER — AMOXICILLIN-POT CLAVULANATE 875-125 MG PO TABS
1.0000 | ORAL_TABLET | Freq: Two times a day (BID) | ORAL | 0 refills | Status: AC
  Filled 2022-04-04: qty 20, 10d supply, fill #0

## 2022-04-04 NOTE — Progress Notes (Signed)
Date:  04/04/2022   Name:  Hailey [name]   DOB:  17-Mar-1976   MRN:  [id number]   Chief Complaint: Abdominal Pain (RUQ Pain. Started 1.5 weeks ago. )  Abdominal Pain This is a new problem. Episode onset: 1.5 weeks ago. The onset quality is gradual. The problem occurs constantly. The problem has been waxing and waning. The pain is located in the RUQ and epigastric region. The pain is at a severity of 6/10. The pain is moderate. The quality of the pain is burning, colicky, cramping and a sensation of fullness. The abdominal pain radiates to the epigastric region. Associated symptoms include diarrhea (going 6 times daily) and nausea. Pertinent negatives include no fever, frequency or vomiting. Associated symptoms comments: chills. The pain is aggravated by movement, palpation, coughing and certain positions. She has tried acetaminophen for the symptoms. The treatment provided mild (of chills) relief.    Lab Results  Component Value Date   NA 133 (L) 09/23/2021   K 3.7 09/23/2021   CO2 24 09/23/2021   GLUCOSE 108 (H) 09/23/2021   BUN 14 09/23/2021   CREATININE 0.51 09/23/2021   CALCIUM 8.8 (L) 09/23/2021   EGFR 109 07/07/2021   GFRNONAA >60 09/23/2021   Lab Results  Component Value Date   CHOL 181 09/15/2019   HDL 70 09/15/2019   LDLCALC 91 09/15/2019   TRIG 116 09/15/2019   CHOLHDL 2.6 09/15/2019   Lab Results  Component Value Date   TSH 7.288 (H) 02/20/2020   Lab Results  Component Value Date   HGBA1C 5.8 (H) 09/15/2019   Lab Results  Component Value Date   WBC 8.5 09/23/2021   HGB 12.3 09/23/2021   HCT 35.9 (L) 09/23/2021   MCV 88.2 09/23/2021   PLT 372 09/23/2021   Lab Results  Component Value Date   ALT 23 09/23/2021   AST 21 09/23/2021   ALKPHOS 94 09/23/2021   BILITOT 0.4 09/23/2021   Lab Results  Component Value Date   VD25OH 29 12/02/2018     Review of Systems  Constitutional:  Positive for chills. Negative for diaphoresis, fatigue and fever.   Respiratory:  Negative for chest tightness and shortness of breath.   Cardiovascular:  Negative for chest pain.  Gastrointestinal:  Positive for abdominal pain, diarrhea (going 6 times daily) and nausea. Negative for blood in stool and vomiting.  Genitourinary:  Negative for frequency.  Psychiatric/Behavioral:  Positive for sleep disturbance. Negative for dysphoric mood. The patient is not nervous/anxious.     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   Zanaflex [Tizanidine Hcl]    Ciprofloxacin Hives   Escitalopram Other (See Comments)    Intolerant.   Latex Rash   Levaquin [Levofloxacin In D5w] Rash    HIVES  Sulfa Antibiotics Other (See Comments)    Can't remember reactions    Past Surgical History:  Procedure Laterality Date   BREAST BIOPSY Right 10/18/2015   bx/clip- neg   CHOLECYSTECTOMY N/A 07/07/2016   Procedure: LAPAROSCOPIC CHOLECYSTECTOMY;  Surgeon: Olean Ree, MD;  Location: [facility] ORS;  Service: General;  Laterality: N/A;   COLONOSCOPY WITH PROPOFOL N/A 10/11/2015   Procedure: COLONOSCOPY WITH PROPOFOL;  Surgeon: Lollie Sails, MD;  Location: [facility] ENDOSCOPY;  Service: Endoscopy;  Laterality: N/A;    ESOPHAGOGASTRODUODENOSCOPY (EGD) WITH PROPOFOL N/A 10/11/2015   Procedure: ESOPHAGOGASTRODUODENOSCOPY (EGD) WITH PROPOFOL;  Surgeon: Lollie Sails, MD;  Location: [facility] ENDOSCOPY;  Service: Endoscopy;  Laterality: N/A;   TONSILLECTOMY     TOTAL THYROIDECTOMY  2005   goiter    Social History   Tobacco Use   Smoking status: Former    Packs/day: 0.50    Years: 7.00    Total pack years: 3.50    Types: Cigarettes    Quit date: 2003    Years since quitting: 20.5    Passive exposure: Never   Smokeless tobacco: Never  Vaping Use   Vaping Use: Never used  Substance Use Topics   Alcohol use: Yes    Comment: Occ.   Drug use: No     Medication list has been reviewed and updated.  Current Meds  Medication Sig   albuterol (VENTOLIN HFA) 108 (90 Base) MCG/ACT inhaler Inhale 2 puffs into the lungs every 6 (six) hours as needed for wheezing or shortness of breath.   Fluticasone-Salmeterol (ADVAIR) 100-50 MCG/DOSE AEPB Inhale 1 puff into the lungs 2 (two) times daily. (Patient taking differently: Inhale 1 puff into the lungs 2 (two) times daily as needed.)   levothyroxine (SYNTHROID) 175 MCG tablet Take 1 tablet by mouth once daily   [DISCONTINUED] amoxicillin (AMOXIL) 500 MG capsule Take 1 capsule (500 mg total) by mouth 4 (four) times daily until gone.   [DISCONTINUED] HYDROcodone-acetaminophen (NORCO/VICODIN) 5-325 MG tablet Take 1 tablet by mouht every 4 to 6 hours. Do not exceed 6 tablets in a 24 hour period.   [DISCONTINUED] hydrocortisone 2.5 % cream    [DISCONTINUED] meloxicam (MOBIC) 15 MG tablet Take 1 tablet every day by oral route with meals.       04/04/2022    1:51 PM 10/20/2020   11:17 AM 10/11/2020    8:37 AM  GAD 7 : Generalized Anxiety Score  Nervous, Anxious, on Edge 0 0 0  Control/stop worrying 0 0 0  Worry too much - different things 1 0 0  Trouble relaxing 0 0 0  Restless 0 0 0  Easily annoyed or irritable 1 0 0  Afraid - awful might happen 0 0 0  Total GAD 7  Score 2 0 0  Anxiety Difficulty Not difficult at all Not difficult at all Not difficult at all       04/04/2022    1:51 PM 10/20/2021   10:18 AM 10/20/2020   11:17 AM  Depression screen PHQ 2/9  Decreased Interest 1 0 0  Down, Depressed, Hopeless 1 1 0  PHQ - 2 Score 2 1 0  Altered sleeping 1  0  Tired, decreased energy 1  0  Change in appetite 1  0  Feeling bad or failure about yourself  1  0  Trouble concentrating 0  0  Moving slowly or fidgety/restless 1  0  Suicidal thoughts 0  0  PHQ-9 Score 7  0  Difficult doing  work/chores Not difficult at all  Not difficult at all    BP Readings from Last 3 Encounters:  04/04/22 120/70  01/26/22 137/83  10/20/21 123/83    Physical Exam Vitals and nursing note reviewed.  Constitutional:      General: She is in acute distress.     Appearance: She is well-developed.  HENT:     Head: Normocephalic and atraumatic.  Pulmonary:     Effort: Pulmonary effort is normal. No respiratory distress.  Abdominal:     General: Abdomen is protuberant. Bowel sounds are decreased.     Palpations: Abdomen is soft.     Tenderness: There is abdominal tenderness in the right upper quadrant and epigastric area. There is guarding. There is no right CVA tenderness, left CVA tenderness or rebound.  Skin:    General: Skin is warm and dry.     Findings: No rash.  Neurological:     Mental Status: She is alert and oriented to person, place, and time.  Psychiatric:        Mood and Affect: Mood normal.        Behavior: Behavior normal.     Wt Readings from Last 3 Encounters:  04/04/22 247 lb (112 kg)  10/20/21 241 lb (109.3 kg)  09/23/21 242 lb 9.6 oz (110 kg)    BP 120/70   Pulse 74   Ht '5\' 5"'  (1.651 m)   Wt 247 lb (112 kg)   SpO2 96%   BMI 41.10 kg/m   Assessment and Plan: 1. Right upper quadrant abdominal pain Concern for appendicitis/obstruction/colitis/medication side effect Rinvoq Stop Rinvoq Will treat with Augmentin; continue  Tylenol CBC and CT - CBC with Differential/Platelet - CT Abdomen Pelvis W Contrast; Future - amoxicillin-clavulanate (AUGMENTIN) 875-125 MG tablet; Take 1 tablet by mouth 2 (two) times daily for 10 days.  Dispense: 20 tablet; Refill: 0   Partially dictated using Editor, commissioning. Any errors are unintentional.  Halina Maidens, MD [facility]  04/04/2022

## 2022-04-05 ENCOUNTER — Encounter: Payer: Self-pay | Admitting: Internal Medicine

## 2022-04-05 ENCOUNTER — Ambulatory Visit: Admission: RE | Admit: 2022-04-05 | Payer: 59 | Source: Ambulatory Visit

## 2022-05-22 ENCOUNTER — Other Ambulatory Visit: Payer: Self-pay

## 2022-05-22 MED ORDER — AMOXICILLIN 500 MG PO CAPS
ORAL_CAPSULE | ORAL | 0 refills | Status: DC
  Filled 2022-05-22: qty 24, 8d supply, fill #0

## 2022-05-26 ENCOUNTER — Other Ambulatory Visit: Payer: Self-pay

## 2022-05-26 MED ORDER — PREDNISONE 10 MG PO TABS
ORAL_TABLET | ORAL | 0 refills | Status: DC
  Filled 2022-05-26: qty 30, 12d supply, fill #0

## 2022-05-31 ENCOUNTER — Other Ambulatory Visit: Payer: Self-pay

## 2022-06-02 ENCOUNTER — Other Ambulatory Visit (HOSPITAL_COMMUNITY): Payer: Self-pay

## 2022-06-08 ENCOUNTER — Telehealth: Payer: Self-pay | Admitting: Pharmacist

## 2022-06-08 ENCOUNTER — Other Ambulatory Visit (HOSPITAL_COMMUNITY): Payer: Self-pay

## 2022-06-08 MED ORDER — TOFACITINIB CITRATE 5 MG PO TABS: ORAL_TABLET | ORAL | 4 refills | Status: DC

## 2022-06-08 NOTE — Telephone Encounter (Signed)
Called patient to schedule an appointment for the [facility] [facility]. I was unable to reach the patient so I left a HIPAA-compliant message requesting that the patient return my call.   Luke Van Ausdall, PharmD, BCACP, CPP Clinical Pharmacist [facility] [phone]  

## 2022-06-09 ENCOUNTER — Other Ambulatory Visit (HOSPITAL_COMMUNITY): Payer: Self-pay

## 2022-06-13 ENCOUNTER — Ambulatory Visit: Payer: 59 | Attending: Internal Medicine | Admitting: Pharmacist

## 2022-06-13 ENCOUNTER — Other Ambulatory Visit (HOSPITAL_COMMUNITY): Payer: Self-pay

## 2022-06-13 DIAGNOSIS — Z7189 Other specified counseling: Secondary | ICD-10-CM

## 2022-06-13 MED ORDER — TOFACITINIB CITRATE 5 MG PO TABS
ORAL_TABLET | ORAL | 4 refills | Status: DC
  Filled 2022-06-13: qty 60, 30d supply, fill #0
  Filled 2022-11-10 (×2): qty 60, 30d supply, fill #1

## 2022-06-13 NOTE — Progress Notes (Signed)
S: Patient presents for review of their specialty medication therapy.  Patient is about to start taking Xeljanz for ankylosing spondylitis. Patient is managed by Dr. Posey Pronto for this. She has tried and failed other biologics in the past.   Adherence: has not started   Efficacy: has not started   Dosing:  Rheumatoid arthritis (monotherapy or in combination with nonbiologic disease-modifying antirheumatic drugs (DMARDs): Oral: Immediate release: 5 mg twice daily  Note: Tofacitinib should not be used in combination with biologic DMARDs or with strong immunosuppressants, such as azathioprine, tacrolimus, or cyclosporine. Do not initiate therapy in patients with an absolute lymphocyte count <500 cells/mm3, absolute neutrophil count <1,000 cells/mm3, or hemoglobin <9 g/dL. Dosage adjustment for strong CYP3A4 inducers (eg, rifampin): Coadministration is not recommended Dosage adjustment for strong CYP3A4 inhibitors (eg, ketoconazole): Reduce dose to 5 mg (immediate release) once daily (the use of the extended release formulation is not recommended) Dosage adjustment for concomitant moderate CYP3A4 inhibitors and potent CYP2C19 inhibitors (eg, fluconazole): Reduce dose to 5 mg (immediate release) once daily  Dosing: Renal Impairment  Mild impairment: No dosage adjustment necessary. Moderate to severe impairment: Reduce dose to 5 mg (immediate release) once daily. A supplemental dose after dialysis is not necessary in patients with severe impairment on dialysis. Note: Tofacitinib has not been studied in patients with baseline CrCl <40 mL/minute. Dosing: Hepatic Impairment  Mild impairment: No dosage adjustment necessary. Moderate impairment: Reduce dose to 5 mg (immediate release) once daily. Severe impairment: Use is not recommended (has not been studied in patients with severe hepatic impairment or in patients with hepatitis B or hepatitis C viruses).   Dosing: Adjustment for Toxicity   Lymphopenia (lymphocytes ?500 cells/mm3): Maintain dose. Lymphopenia (lymphocytes <500 cells/mm3) confirmed by repeat evaluation: Discontinue therapy. Neutropenia (ANC >1,000 cells/mm3): Maintain dose. Neutropenia (ANC persistently between 500 to 1,000 cells/mm3): Interrupt therapy; resume at 5 mg immediate release twice daily or 11 mg extended release once daily when ANC >1,000 cells/mm3. Neutropenia (ANC <500 cells/mm3) confirmed by repeat evaluation: Discontinue therapy. Anemia (hemoglobin ?9 g/dL and decrease ?2 g/dL): Maintain dose. Anemia (hemoglobin <8 g/dL or decrease >2 g/dL) confirmed by repeat evaluation: Interrupt therapy until hemoglobin values have normalized.    Monitoring: S/sx of infection: none S/sx of malignancy: none Bone marrow suppression: none GI adverse effects: none Headache: none LFTs: none Cardiovascular effects (decreased HR): none S/sx blood clots: none   O:     Lab Results  Component Value Date   WBC 10.0 04/04/2022   HGB 12.1 04/04/2022   HCT 35.9 (L) 04/04/2022   MCV 88.2 04/04/2022   PLT 386 04/04/2022      Chemistry      Component Value Date/Time   NA 133 (L) 09/23/2021 1220   NA 139 07/07/2021 1347   NA 138 08/27/2014 1257   K 3.7 09/23/2021 1220   K 3.7 08/27/2014 1257   CL 104 09/23/2021 1220   CL 104 08/27/2014 1257   CO2 24 09/23/2021 1220   CO2 23 08/27/2014 1257   BUN 14 09/23/2021 1220   BUN 11 07/07/2021 1347   BUN 8 08/27/2014 1257   CREATININE 0.51 09/23/2021 1220   CREATININE 0.69 08/27/2014 1257   GLU 94 12/05/2019 0000      Component Value Date/Time   CALCIUM 8.8 (L) 09/23/2021 1220   CALCIUM 8.8 08/27/2014 1257   ALKPHOS 94 09/23/2021 1220   AST 21 09/23/2021 1220   ALT 23 09/23/2021 1220   BILITOT 0.4 09/23/2021 1220  BILITOT 0.3 09/15/2019 1003       A/P: 1. Medication review: patient is about to start taking Xeljanz for ankylosing spondylitis. Reviewed the medications with the patient including the  following: Xeljanz (tofacitinib) is a Janus Associated Kinase Inhibitor which prevents cytokine- or growth factor-medicated gene expression and intracellular activity of immune cells, reduces circulating CD16/56+ natural killer cells, serum IgG, IgM, IgA, and C-reactive protein, and increases B cells. Patient educated on purpose, proper use and potential adverse effects of Xeljanz.  Adverse effects include increased risk of infection, risk of malignancy, as well as bone marrow suppression, GI upset, and decreased heart rate. Patient should avoid live vaccinations while on this medication. The FDA has issued a safety alert warning about an increased risk of blood clots and death with the 10 mg twice-daily dose of Harley Alto XR (tofacitinib), which is used in patients with ulcerative colitis. The approved use of tofacitinib for ulcerative colitis will be limited to certain patients who are not treated effectively or who experience severe side effects with certain other medicines. New warnings about these risks, including a Boxed Warning, have been added to the Harley Alto XR prescribing information. No recommendations for changes at this time.  Benard Halsted, PharmD, [place], [place] [phone]

## 2022-06-15 ENCOUNTER — Other Ambulatory Visit: Payer: Self-pay

## 2022-06-15 ENCOUNTER — Other Ambulatory Visit (HOSPITAL_COMMUNITY): Payer: Self-pay

## 2022-06-16 ENCOUNTER — Ambulatory Visit: Payer: 59

## 2022-06-16 ENCOUNTER — Other Ambulatory Visit: Payer: Self-pay

## 2022-06-26 ENCOUNTER — Other Ambulatory Visit: Payer: Self-pay

## 2022-06-26 ENCOUNTER — Other Ambulatory Visit (HOSPITAL_COMMUNITY): Payer: Self-pay

## 2022-06-26 DIAGNOSIS — L304 Erythema intertrigo: Secondary | ICD-10-CM | POA: Diagnosis not present

## 2022-06-26 MED ORDER — HYDROCORTISONE 2.5 % EX OINT
TOPICAL_OINTMENT | CUTANEOUS | 0 refills | Status: DC
  Filled 2022-06-26: qty 56.7, 30d supply, fill #0

## 2022-06-26 MED ORDER — FLUCONAZOLE 200 MG PO TABS
ORAL_TABLET | ORAL | 0 refills | Status: DC
  Filled 2022-06-26: qty 30, 7d supply, fill #0

## 2022-06-26 MED ORDER — FLUCONAZOLE 200 MG PO TABS: ORAL_TABLET | ORAL | 0 refills | Status: DC

## 2022-06-26 MED ORDER — KETOCONAZOLE 2 % EX CREA
TOPICAL_CREAM | CUTANEOUS | 3 refills | Status: DC
  Filled 2022-06-26: qty 60, 30d supply, fill #0

## 2022-06-26 MED ORDER — NYSTATIN 100000 UNIT/GM EX POWD
CUTANEOUS | 11 refills | Status: DC
  Filled 2022-06-26: qty 60, 30d supply, fill #0

## 2022-06-27 ENCOUNTER — Other Ambulatory Visit: Payer: Self-pay

## 2022-07-05 ENCOUNTER — Other Ambulatory Visit: Payer: Self-pay

## 2022-07-05 DIAGNOSIS — R1011 Right upper quadrant pain: Secondary | ICD-10-CM

## 2022-07-07 ENCOUNTER — Ambulatory Visit
Admission: RE | Admit: 2022-07-07 | Discharge: 2022-07-07 | Disposition: A | Discharge status: Home / Self Care | Payer: 59 | Source: Ambulatory Visit | Attending: Internal Medicine | Admitting: Internal Medicine

## 2022-07-07 DIAGNOSIS — R1011 Right upper quadrant pain: Secondary | ICD-10-CM | POA: Diagnosis not present

## 2022-07-07 DIAGNOSIS — R1031 Right lower quadrant pain: Secondary | ICD-10-CM | POA: Diagnosis not present

## 2022-07-07 MED ORDER — IOHEXOL 300 MG/ML  SOLN
100.0000 mL | Freq: Once | INTRAMUSCULAR | Status: AC | PRN
  Administered 2022-07-07: 100 mL via INTRAVENOUS

## 2022-07-10 ENCOUNTER — Other Ambulatory Visit: Payer: Self-pay

## 2022-07-10 ENCOUNTER — Other Ambulatory Visit (HOSPITAL_COMMUNITY): Payer: Self-pay

## 2022-07-10 DIAGNOSIS — R300 Dysuria: Secondary | ICD-10-CM | POA: Diagnosis not present

## 2022-07-10 DIAGNOSIS — K219 Gastro-esophageal reflux disease without esophagitis: Secondary | ICD-10-CM | POA: Diagnosis not present

## 2022-07-10 DIAGNOSIS — R1013 Epigastric pain: Secondary | ICD-10-CM | POA: Diagnosis not present

## 2022-07-10 DIAGNOSIS — R197 Diarrhea, unspecified: Secondary | ICD-10-CM | POA: Diagnosis not present

## 2022-07-10 MED ORDER — PANTOPRAZOLE SODIUM 40 MG PO TBEC
DELAYED_RELEASE_TABLET | ORAL | 6 refills | Status: DC
  Filled 2022-07-10: qty 60, 30d supply, fill #0

## 2022-07-10 MED ORDER — PEG 3350-KCL-NA BICARB-NACL 420 G PO SOLR
ORAL | 0 refills | Status: DC
  Filled 2022-07-10: qty 4000, 1d supply, fill #0

## 2022-07-12 ENCOUNTER — Other Ambulatory Visit (HOSPITAL_COMMUNITY): Payer: Self-pay

## 2022-07-14 ENCOUNTER — Ambulatory Visit
Admission: EM | Admit: 2022-07-14 | Discharge: 2022-07-14 | Disposition: A | Discharge status: Home / Self Care | Payer: 59 | Attending: Family Medicine | Admitting: Family Medicine

## 2022-07-14 ENCOUNTER — Other Ambulatory Visit: Payer: Self-pay

## 2022-07-14 DIAGNOSIS — J4521 Mild intermittent asthma with (acute) exacerbation: Secondary | ICD-10-CM | POA: Insufficient documentation

## 2022-07-14 DIAGNOSIS — J45909 Unspecified asthma, uncomplicated: Secondary | ICD-10-CM

## 2022-07-14 DIAGNOSIS — Z1152 Encounter for screening for COVID-19: Secondary | ICD-10-CM | POA: Insufficient documentation

## 2022-07-14 DIAGNOSIS — R0602 Shortness of breath: Secondary | ICD-10-CM | POA: Diagnosis not present

## 2022-07-14 LAB — RESP PANEL BY RT-PCR (RSV, FLU A&B, COVID)  RVPGX2
Influenza A by PCR: NEGATIVE
Influenza B by PCR: NEGATIVE
Resp Syncytial Virus by PCR: NEGATIVE
SARS Coronavirus 2 by RT PCR: NEGATIVE

## 2022-07-14 MED ORDER — ALBUTEROL SULFATE (2.5 MG/3ML) 0.083% IN NEBU
2.5000 mg | INHALATION_SOLUTION | Freq: Once | RESPIRATORY_TRACT | Status: AC
  Administered 2022-07-14: 2.5 mg via RESPIRATORY_TRACT

## 2022-07-14 MED ORDER — DEXAMETHASONE SODIUM PHOSPHATE 10 MG/ML IJ SOLN
10.0000 mg | Freq: Once | INTRAMUSCULAR | Status: AC
  Administered 2022-07-14: 10 mg via INTRAMUSCULAR

## 2022-07-14 MED ORDER — ALBUTEROL SULFATE HFA 108 (90 BASE) MCG/ACT IN AERS
2.0000 | INHALATION_SPRAY | Freq: Four times a day (QID) | RESPIRATORY_TRACT | 3 refills | Status: DC | PRN
  Filled 2022-07-14: qty 20.1, 75d supply, fill #0

## 2022-07-14 MED ORDER — FLUTICASONE-SALMETEROL 100-50 MCG/ACT IN AEPB
1.0000 | INHALATION_SPRAY | Freq: Two times a day (BID) | RESPIRATORY_TRACT | 2 refills | Status: DC | PRN
  Filled 2022-07-14: qty 60, 30d supply, fill #0

## 2022-07-14 MED ORDER — PROMETHAZINE-DM 6.25-15 MG/5ML PO SYRP
5.0000 mL | ORAL_SOLUTION | Freq: Four times a day (QID) | ORAL | 0 refills | Status: DC | PRN
  Filled 2022-07-14: qty 118, 6d supply, fill #0

## 2022-07-14 NOTE — ED Provider Notes (Signed)
[facility]    CSN: [id number] Arrival date & time: 07/14/22  6222      History   Chief Complaint Chief Complaint  Patient presents with   Cough   Shortness of Breath    HPI FLOYD LUSIGNAN is a 46 y.o. female.   HPI   Darrielle with history of asthma presents for shortness of breath and cough.  Has some chest tightness.  States she was up all night tossing and turning last night.  She feels there is phlegm in her throat.  She is unsure if she had a fever but has felt hot.  She also had chills.  No nasal congestion or rhinorrhea.  Denies vomiting, nausea, diarrhea, diaphoresis.  Her symptoms kept her up all night.  She has occupational exposure to [place], flu and RSV.  She tried using her albuterol inhaler but that is not improving her symptoms.  Has Advair at home but has not used it.     Past Medical History:  Diagnosis Date   Allergic state    Allergy    Ankylosing spondylitis ([place])    followed by rheumatology Dr. Posey Pronto   Arthritis    Asthma    Cervical disc disease    Depression    Enlarged lymph nodes    GERD (gastroesophageal reflux disease)    H/O colonoscopy    HPV (human papilloma virus) infection    Hypothyroidism    Interstitial cystitis    LGSIL (low grade squamous intraepithelial dysplasia)    Paresthesia    Status post laparoscopic cholecystectomy 07/13/2016    Patient Active Problem List   Diagnosis Date Noted   Generalized lymphadenopathy 09/23/2021   B12 deficiency 09/23/2021   Fatigue 09/23/2021   Primary osteoarthritis of both knees 04/29/2021   Ankylosing spondylitis ([place]) 12/30/2018   Asthma in adult without complication 97/98/9211   Chronic diarrhea 12/30/2018   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   Interstitial cystitis 07/03/2016   Pelvic pain in female 07/03/2016   Chondromalacia of knee, 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   BMI 38.0-38.9,adult 02/14/2016   Polyarthralgia 02/14/2016   GERD (gastroesophageal reflux disease) 02/10/2014     Past Surgical History:  Procedure Laterality Date   BREAST BIOPSY Right 10/18/2015   bx/clip- neg   CHOLECYSTECTOMY N/A 07/07/2016   Procedure: LAPAROSCOPIC CHOLECYSTECTOMY;  Surgeon: Olean Ree, MD;  Location: [facility] ORS;  Service: General;  Laterality: N/A;   COLONOSCOPY WITH PROPOFOL N/A 10/11/2015   Procedure: COLONOSCOPY WITH PROPOFOL;  Surgeon: Lollie Sails, MD;  Location: [facility] ENDOSCOPY;  Service: Endoscopy;  Laterality: N/A;   ESOPHAGOGASTRODUODENOSCOPY (EGD) WITH PROPOFOL N/A 10/11/2015   Procedure: ESOPHAGOGASTRODUODENOSCOPY (EGD) WITH PROPOFOL;  Surgeon: Lollie Sails, MD;  Location: [facility] ENDOSCOPY;  Service: Endoscopy;  Laterality: N/A;   TONSILLECTOMY     TOTAL THYROIDECTOMY  2005   goiter    OB History     Gravida  2   Para  1   Term  1   Preterm      AB  1   Living  1      SAB      IAB      Ectopic      Multiple      Live Births               Home Medications    Prior to Admission medications   Medication Sig Start Date End Date Taking? Authorizing Provider  promethazine-dextromethorphan (PROMETHAZINE-DM) 6.25-15 MG/5ML syrup Take 5 mLs by mouth 4 (four)  times daily as needed. 07/14/22  Yes Marceil Welp, DO  albuterol (VENTOLIN HFA) 108 (90 Base) MCG/ACT inhaler Inhale 2 puffs into the lungs every 6 (six) hours as needed for wheezing or shortness of breath. 07/14/22   Amaria Mundorf, Ronnette Juniper, DO  amoxicillin (AMOXIL) 500 MG capsule Take 2 capsules by mouth now, then 1 capsule by mouth three times daily until all taken. 05/22/22     fluconazole (DIFLUCAN) 200 MG tablet 1(one) tablet(s) oral every day 06/26/22     fluconazole (DIFLUCAN) 200 MG tablet Take one tablet weekly 06/26/22     fluticasone-salmeterol (ADVAIR DISKUS) 100-50 MCG/ACT AEPB Inhale 1 puff into the lungs 2 (two) times daily as needed. 07/14/22   Lyndee Hensen, DO  hydrocortisone 2.5 % ointment 1(one) application(s) topical 2(two) times a day 06/26/22     ketoconazole  (NIZORAL) 2 % cream 1(one) application(s) topical 2(two) times a day 06/26/22     levothyroxine (SYNTHROID) 175 MCG tablet Take 1 tablet by mouth once daily 01/17/22     nystatin (MYCOSTATIN/NYSTOP) powder 1(one) application(s) topical 2(two) times a day as needed 06/26/22     pantoprazole (PROTONIX) 40 MG tablet Take by mouth. 08/02/16 08/02/17  [provider]  pantoprazole (PROTONIX) 40 MG tablet Take 1 tablet (40 mg total) by mouth 2 (two) times daily before meals Take 30 min before meals. 07/10/22     polyethylene glycol-electrolytes (NULYTELY) 420 g solution Take 4,000 mLs by mouth once for 1 dose 07/10/22     predniSONE (DELTASONE) 10 MG tablet Take 4 tablets (40 mg total) by mouth once daily for 3 days, THEN 3 tablets (30 mg total) once daily for 3 days, THEN 2 tablets (20 mg total) once daily for 3 days, THEN 1 tablet (10 mg total) once daily for 3 days. 05/26/22     Tofacitinib Citrate 5 MG TABS take 1 tablet by mouth twice daily as directed 06/13/22   Tresa Garter, MD    Family History Family History  Problem Relation Age of Onset   Cancer Mother        Vulva cancer   Hypertension Mother    Stroke Mother    Goiter Mother    Diabetes Father    Hypertension Father    Hypertension Brother    Bladder Cancer Maternal Aunt    Hypertension Maternal Grandmother    Stroke Maternal Grandmother    Diabetes Maternal Grandmother    Breast cancer Cousin 36       pat cousin    Social History Social History   Tobacco Use   Smoking status: Former    Packs/day: 0.50    Years: 7.00    Total pack years: 3.50    Types: Cigarettes    Quit date: 2003    Years since quitting: 20.8    Passive exposure: Never   Smokeless tobacco: Never  Vaping Use   Vaping Use: Never used  Substance Use Topics   Alcohol use: Yes    Comment: Occ.   Drug use: No     Allergies   Hydrocodone-acetaminophen, Lodine [etodolac], Quetiapine, Zanaflex [tizanidine hcl], Ciprofloxacin,  Escitalopram, Latex, Levaquin [levofloxacin in d5w], and Sulfa antibiotics   Review of Systems Review of Systems: negative unless otherwise stated in HPI.      Physical Exam Triage Vital Signs ED Triage Vitals  Enc Vitals Group     BP 07/14/22 0910 119/84     Pulse Rate 07/14/22 0910 80     Resp 07/14/22 0910 16  Temp 07/14/22 0910 98.3 F (36.8 C)     Temp Source 07/14/22 0910 Oral     SpO2 07/14/22 0910 97 %     Weight --      Height --      Head Circumference --      Peak Flow --      Pain Score 07/14/22 0909 1     Pain Loc --      Pain Edu? --      Excl. in [place]? --    No data found.  Updated Vital Signs BP 119/84 (BP Location: Left Arm)   Pulse 80   Temp 98.3 F (36.8 C) (Oral)   Resp 16   SpO2 97%   Visual Acuity Right Eye Distance:   Left Eye Distance:   Bilateral Distance:    Right Eye Near:   Left Eye Near:    Bilateral Near:     Physical Exam GEN:     alert, non-toxic appearing female in no distress     HENT:  mucus membranes moist, oropharyngeal without lesions or exudate, no oropharyngeal erythema, no nasal discharge,  bilateral TM  normal EYES:   pupils equal and reactive, no scleral injection, no discharge, wearing glasses NECK:  normal ROM, no meningismus   RESP:  no increased work of breathing, diffuse expiratory wheezing  CVS:   regular rate  and rhythm Skin:   warm and dry, no rash on visible skin, normal  skin turgor    UC Treatments / Results  Labs (all labs ordered are listed, but only abnormal results are displayed) Labs Reviewed  RESP PANEL BY RT-PCR (RSV, FLU A&B, COVID)  [id number]    EKG   Radiology No results found.  Procedures Procedures (including critical care time)  Medications Ordered in UC Medications  dexamethasone (DECADRON) injection 10 mg (10 mg Intramuscular Given 07/14/22 0931)  albuterol (PROVENTIL) (2.5 MG/3ML) 0.083% nebulizer solution 2.5 mg (2.5 mg Nebulization Given 07/14/22 0941)    Initial  Impression / Assessment and Plan / UC Course  I have reviewed the triage vital signs and the nursing notes.  Pertinent labs & imaging results that were available during my care of the patient were reviewed by me and considered in my medical decision making (see chart for details).       Pt is a 46 y.o. female with history of asthma who presents for 1 day of respiratory symptoms. [place] is  afebrile here without recent antipyretics. Satting well on room air. Overall pt is  well appearing, well hydrated, without respiratory distress. Pulmonary exam  is remarkable for diffuse wheezing. Given albuterol nebulizer with improvement. RSV, COVID  and influenza testing obtained and were negative. History consistent with viral respiratory illness. Discussed symptomatic treatment. Given IM Decadron 10 mg instead of oral steroids for acute asthma flare. Refilled controller and rescue inhalers.  Promethazine for cough. Explained lack of efficacy of antibiotics in viral disease.  Typical duration of symptoms discussed.  Return and ED precautions given and patient voiced understanding. Work note provided.   Discussed MDM, treatment plan and plan for follow-up with patient who agrees with plan.     Final Clinical Impressions(s) / UC Diagnoses   Final diagnoses:  Mild intermittent asthma with acute exacerbation     Discharge Instructions      You are given a breathing treatment to help with the wheezing today.  Albuterol may cause your heart to race.  This will subside when the medicine starts  to wear off.   You were given a steroid injection to help with your asthma flare.  We will contact you if your influenza or COVID test is positive.  Please quarantine while you wait for the results.  If your test is negative you may resume normal activities.  If your test is positive please continue to quarantine for at least 5 days from your symptom onset or until you are without a fever for at least 24 hours after the  medications.   You can take Tylenol and/or Ibuprofen as needed for fever reduction and pain relief.    For cough: honey 1/2 to 1 teaspoon (you can dilute the honey in water or another fluid).  You can also use guaifenesin and dextromethorphan for cough. You can use a humidifier for chest congestion and cough.  If you don't have a humidifier, you can sit in the bathroom with the hot shower running.      For sore throat: try warm salt water gargles, cepacol lozenges, throat spray, warm tea or water with lemon/honey, popsicles or ice, or OTC cold relief medicine for throat discomfort.    For congestion: take a daily anti-histamine like Zyrtec, Claritin, and a oral decongestant, such as pseudoephedrine.  You can also use Flonase 1-2 sprays in each nostril daily.    It is important to stay hydrated: drink plenty of fluids (water, gatorade/powerade/pedialyte, juices, or teas) to keep your throat moisturized and help further relieve irritation/discomfort.    Return or go to the Emergency Department if symptoms worsen or do not improve in the next few days      ED Prescriptions     Medication Sig Dispense Auth. Provider   albuterol (VENTOLIN HFA) 108 (90 Base) MCG/ACT inhaler Inhale 2 puffs into the lungs every 6 (six) hours as needed for wheezing or shortness of breath. 20.1 g Tatumn Corbridge, DO   fluticasone-salmeterol (ADVAIR DISKUS) 100-50 MCG/ACT AEPB Inhale 1 puff into the lungs 2 (two) times daily as needed. 60 each Honi Name, DO   promethazine-dextromethorphan (PROMETHAZINE-DM) 6.25-15 MG/5ML syrup Take 5 mLs by mouth 4 (four) times daily as needed. 118 mL Lyndee Hensen, DO      PDMP not reviewed this encounter.   Lyndee Hensen, DO 07/14/22 1035

## 2022-07-14 NOTE — ED Triage Notes (Signed)
Patient presents to UC for cough, wheezing, and SOB since this morning. Treating symptoms with inhaler and tessalon with no improvement. Hx of asthma.   Denies fever.

## 2022-07-14 NOTE — Discharge Instructions (Signed)
You are given a breathing treatment to help with the wheezing today.  Albuterol may cause your heart to race.  This will subside when the medicine starts to wear off.   You were given a steroid injection to help with your asthma flare.  We will contact you if your influenza or COVID test is positive.  Please quarantine while you wait for the results.  If your test is negative you may resume normal activities.  If your test is positive please continue to quarantine for at least 5 days from your symptom onset or until you are without a fever for at least 24 hours after the medications.   You can take Tylenol and/or Ibuprofen as needed for fever reduction and pain relief.    For cough: honey 1/2 to 1 teaspoon (you can dilute the honey in water or another fluid).  You can also use guaifenesin and dextromethorphan for cough. You can use a humidifier for chest congestion and cough.  If you don't have a humidifier, you can sit in the bathroom with the hot shower running.      For sore throat: try warm salt water gargles, cepacol lozenges, throat spray, warm tea or water with lemon/honey, popsicles or ice, or OTC cold relief medicine for throat discomfort.    For congestion: take a daily anti-histamine like Zyrtec, Claritin, and a oral decongestant, such as pseudoephedrine.  You can also use Flonase 1-2 sprays in each nostril daily.    It is important to stay hydrated: drink plenty of fluids (water, gatorade/powerade/pedialyte, juices, or teas) to keep your throat moisturized and help further relieve irritation/discomfort.    Return or go to the Emergency Department if symptoms worsen or do not improve in the next few days

## 2022-08-09 ENCOUNTER — Other Ambulatory Visit: Payer: Self-pay

## 2022-08-09 ENCOUNTER — Ambulatory Visit
Admission: EM | Admit: 2022-08-09 | Discharge: 2022-08-09 | Disposition: A | Discharge status: Home / Self Care | Payer: 59 | Attending: Family Medicine | Admitting: Family Medicine

## 2022-08-09 DIAGNOSIS — Z1152 Encounter for screening for COVID-19: Secondary | ICD-10-CM | POA: Insufficient documentation

## 2022-08-09 DIAGNOSIS — H66001 Acute suppurative otitis media without spontaneous rupture of ear drum, right ear: Secondary | ICD-10-CM | POA: Diagnosis not present

## 2022-08-09 DIAGNOSIS — J069 Acute upper respiratory infection, unspecified: Secondary | ICD-10-CM | POA: Insufficient documentation

## 2022-08-09 LAB — GROUP A STREP BY PCR: Group A Strep by PCR: NOT DETECTED

## 2022-08-09 LAB — RESP PANEL BY RT-PCR (FLU A&B, COVID) ARPGX2
Influenza A by PCR: NEGATIVE
Influenza B by PCR: NEGATIVE
SARS Coronavirus 2 by RT PCR: NEGATIVE

## 2022-08-09 MED ORDER — PROMETHAZINE-DM 6.25-15 MG/5ML PO SYRP
5.0000 mL | ORAL_SOLUTION | Freq: Four times a day (QID) | ORAL | 0 refills | Status: DC | PRN
  Filled 2022-08-09: qty 118, 6d supply, fill #0

## 2022-08-09 MED ORDER — AMOXICILLIN 500 MG PO CAPS
500.0000 mg | ORAL_CAPSULE | Freq: Two times a day (BID) | ORAL | 0 refills | Status: AC
  Filled 2022-08-09: qty 20, 10d supply, fill #0

## 2022-08-09 MED ORDER — FLUCONAZOLE 150 MG PO TABS
150.0000 mg | ORAL_TABLET | ORAL | 0 refills | Status: AC
  Filled 2022-08-09: qty 2, 6d supply, fill #0

## 2022-08-09 NOTE — ED Provider Notes (Signed)
[facility]    CSN: [id number] Arrival date & time: 08/09/22  1041      History   Chief Complaint Chief Complaint  Patient presents with   Laryngitis   Otalgia    HPI Hailey [name] is a 46 y.o. female.   HPI   Hailey [name] presents for ear pain and sore throat that started last night.  Endorses productive cough.  States that she was sick earlier and her symptoms resolved and now she feels like she is sick again.  She has a sore throat, headache and myalgias.  Denies fever, chills, belly pain, chest discomfort, vomiting, diarrhea.  She took a COVID test at home and it was negative.  She has been taking prescription cough medicine but this has ran out.  She has also tried some over-the-counter treatments without much relief. Has been coughing up green phlegm.     Fever : no Chills: no Sore throat: yes, painful swallowing   Cough: yes Ear pain: yes  Sputum: no Nasal congestion : no  Rhinorrhea: no Myalgias: yes Appetite: normal  Hydration: normal  Abdominal pain: no Nausea: no Vomiting: no Diarrhea: No Rash: No Sleep disturbance: no Headache: yes     Past Medical History:  Diagnosis Date   Allergic state    Allergy    Ankylosing spondylitis ([place])    followed by rheumatology Dr. Posey Pronto   Arthritis    Asthma    Cervical disc disease    Depression    Enlarged lymph nodes    GERD (gastroesophageal reflux disease)    H/O colonoscopy    HPV (human papilloma virus) infection    Hypothyroidism    Interstitial cystitis    LGSIL (low grade squamous intraepithelial dysplasia)    Paresthesia    Status post laparoscopic cholecystectomy 07/13/2016    Patient Active Problem List   Diagnosis Date Noted   Generalized lymphadenopathy 09/23/2021   B12 deficiency 09/23/2021   Fatigue 09/23/2021   Primary osteoarthritis of both knees 04/29/2021   Ankylosing spondylitis ([place]) 12/30/2018   Asthma in adult without complication 48/25/0037   Chronic diarrhea  12/30/2018   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   Interstitial cystitis 07/03/2016   Pelvic pain in female 07/03/2016   Chondromalacia of knee, 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   BMI 38.0-38.9,adult 02/14/2016   Polyarthralgia 02/14/2016   GERD (gastroesophageal reflux disease) 02/10/2014    Past Surgical History:  Procedure Laterality Date   BREAST BIOPSY Right 10/18/2015   bx/clip- neg   CHOLECYSTECTOMY N/A 07/07/2016   Procedure: LAPAROSCOPIC CHOLECYSTECTOMY;  Surgeon: Olean Ree, MD;  Location: [facility] ORS;  Service: General;  Laterality: N/A;   COLONOSCOPY WITH PROPOFOL N/A 10/11/2015   Procedure: COLONOSCOPY WITH PROPOFOL;  Surgeon: Lollie Sails, MD;  Location: [facility] ENDOSCOPY;  Service: Endoscopy;  Laterality: N/A;   ESOPHAGOGASTRODUODENOSCOPY (EGD) WITH PROPOFOL N/A 10/11/2015   Procedure: ESOPHAGOGASTRODUODENOSCOPY (EGD) WITH PROPOFOL;  Surgeon: Lollie Sails, MD;  Location: [facility] ENDOSCOPY;  Service: Endoscopy;  Laterality: N/A;   TONSILLECTOMY     TOTAL THYROIDECTOMY  2005   goiter    OB History     Gravida  2   Para  1   Term  1   Preterm      AB  1   Living  1      SAB      IAB      Ectopic      Multiple      Live Births  Home Medications    Prior to Admission medications   Medication Sig Start Date End Date Taking? Authorizing Provider  fluconazole (DIFLUCAN) 150 MG tablet Take 1 tablet (150 mg total) by mouth every 3 (three) days for 2 doses. 08/09/22 08/15/22 Yes Addalyn Speedy, DO  albuterol (VENTOLIN HFA) 108 (90 Base) MCG/ACT inhaler Inhale 2 puffs into the lungs every 6 (six) hours as needed for wheezing or shortness of breath. 07/14/22   Ercell Perlman, Ronnette Juniper, DO  amoxicillin (AMOXIL) 500 MG capsule Take 1 capsule (500 mg total) by mouth 2 (two) times daily for 10 days. 08/09/22 08/19/22  Poonam Woehrle, Ronnette Juniper, DO  fluticasone-salmeterol (ADVAIR DISKUS) 100-50 MCG/ACT AEPB Inhale 1 puff into the lungs 2 (two) times daily as needed. 07/14/22    Lyndee Hensen, DO  hydrocortisone 2.5 % ointment 1(one) application(s) topical 2(two) times a day 06/26/22     levothyroxine (SYNTHROID) 175 MCG tablet Take 1 tablet by mouth once daily 01/17/22     pantoprazole (PROTONIX) 40 MG tablet Take by mouth. 08/02/16 08/02/17  [provider]  pantoprazole (PROTONIX) 40 MG tablet Take 1 tablet (40 mg total) by mouth 2 (two) times daily before meals Take 30 min before meals. 07/10/22     polyethylene glycol-electrolytes (NULYTELY) 420 g solution Take 4,000 mLs by mouth once for 1 dose 07/10/22     promethazine-dextromethorphan (PROMETHAZINE-DM) 6.25-15 MG/5ML syrup Take 5 mLs by mouth 4 (four) times daily as needed. 08/09/22   Lyndee Hensen, DO  Tofacitinib Citrate 5 MG TABS take 1 tablet by mouth twice daily as directed 06/13/22   Tresa Garter, MD    Family History Family History  Problem Relation Age of Onset   Cancer Mother        Vulva cancer   Hypertension Mother    Stroke Mother    Goiter Mother    Diabetes Father    Hypertension Father    Hypertension Brother    Bladder Cancer Maternal Aunt    Hypertension Maternal Grandmother    Stroke Maternal Grandmother    Diabetes Maternal Grandmother    Breast cancer Cousin 9       pat cousin    Social History Social History   Tobacco Use   Smoking status: Former    Packs/day: 0.50    Years: 7.00    Total pack years: 3.50    Types: Cigarettes    Quit date: 2003    Years since quitting: 20.9    Passive exposure: Never   Smokeless tobacco: Never  Vaping Use   Vaping Use: Never used  Substance Use Topics   Alcohol use: Yes    Comment: Occ.   Drug use: No     Allergies   Hydrocodone-acetaminophen, Lodine [etodolac], Quetiapine, Zanaflex [tizanidine hcl], Ciprofloxacin, Escitalopram, Latex, Levaquin [levofloxacin in d5w], and Sulfa antibiotics   Review of Systems Review of Systems: negative unless otherwise stated in HPI.      Physical Exam Triage Vital  Signs ED Triage Vitals  Enc Vitals Group     BP 08/09/22 1112 133/83     Pulse Rate 08/09/22 1112 86     Resp 08/09/22 1112 18     Temp 08/09/22 1112 98 F (36.7 C)     Temp Source 08/09/22 1112 Oral     SpO2 08/09/22 1112 99 %     Weight 08/09/22 1105 246 lb 14.6 oz (112 kg)     Height 08/09/22 1105 '5\' 5"'$  (1.651 m)     Head Circumference --  Peak Flow --      Pain Score 08/09/22 1108 5     Pain Loc --      Pain Edu? --      Excl. in [place]? --    No data found.  Updated Vital Signs BP 133/83 (BP Location: Left Arm)   Pulse 86   Temp 98 F (36.7 C) (Oral)   Resp 18   Ht '5\' 5"'$  (1.651 m)   Wt 112 kg   SpO2 99%   BMI 41.09 kg/m   Visual Acuity Right Eye Distance:   Left Eye Distance:   Bilateral Distance:    Right Eye Near:   Left Eye Near:    Bilateral Near:     Physical Exam GEN:     alert, non-toxic appearing female in no distress    HENT:  mucus membranes moist, oropharyngeal without lesions or exudate, no tonsillar hypertrophy, mild oropharyngeal erythema, clear nasal discharge, left TM opaque and bulging, right TM normal EYES:   pupils equal and reactive, no scleral injection or discharge NECK:  normal ROM, no meningismus   RESP:  no increased work of breathing, clear to auscultation bilaterally CVS:   regular rate and rhythm Skin:   warm and dry, no rash on visible skin    UC Treatments / Results  Labs (all labs ordered are listed, but only abnormal results are displayed) Labs Reviewed  GROUP A STREP BY PCR  RESP PANEL BY RT-PCR (FLU A&B, COVID) [id number]    EKG   Radiology No results found.  Procedures Procedures (including critical care time)  Medications Ordered in UC Medications - No data to display  Initial Impression / Assessment and Plan / UC Course  I have reviewed the triage vital signs and the nursing notes.  Pertinent labs & imaging results that were available during my care of the patient were reviewed by me and considered in my  medical decision making (see chart for details).       Pt is a 46 y.o. female with history of asthma who presents for 2 days of respiratory symptoms. Hailey [name] is afebrile here without recent antipyretics. Satting well on room air. Overall pt is non-toxic appearing, well hydrated, without respiratory distress. Pulmonary exam is unremarkable.  COVID and influenza testing obtained and was negative.  Strep PCR negative.  On exam, she has evidence of left acute otitis media.  Treat with antibiotics as below.  Promethazine DM for cough.  Diflucan for antibiotic associated yeast infection.  Tylenol and Motrin as needed for discomfort and fever.  Typical duration of symptoms discussed.   Return and ED precautions given and patient/guardian voiced understanding. Discussed MDM, treatment plan and plan for follow-up with patient who agrees with plan.     Final Clinical Impressions(s) / UC Diagnoses   Final diagnoses:  Non-recurrent acute suppurative otitis media of right ear without spontaneous rupture of tympanic membrane  Upper respiratory tract infection, unspecified type     Discharge Instructions      Stop by the pharmacy to pick up your prescriptions.  Follow up with your primary care provider as needed.      ED Prescriptions     Medication Sig Dispense Auth. Provider   promethazine-dextromethorphan (PROMETHAZINE-DM) 6.25-15 MG/5ML syrup Take 5 mLs by mouth 4 (four) times daily as needed. 118 mL Magalene Mclear, DO   fluconazole (DIFLUCAN) 150 MG tablet Take 1 tablet (150 mg total) by mouth every 3 (three) days for 2 doses. 2 tablet Franceen Erisman,  Lacrecia Delval, DO   amoxicillin (AMOXIL) 500 MG capsule Take 1 capsule (500 mg total) by mouth 2 (two) times daily for 10 days. 20 capsule Lyndee Hensen, DO      PDMP not reviewed this encounter.   Lyndee Hensen, DO 08/09/22 1800

## 2022-08-09 NOTE — ED Triage Notes (Signed)
Coughing up green phlegm, left ear pain, scratchy throat. Sx started last night.

## 2022-08-09 NOTE — Discharge Instructions (Signed)
Stop by the pharmacy to pick up your prescriptions.  Follow up with your primary care provider as needed.  

## 2022-08-10 ENCOUNTER — Other Ambulatory Visit: Payer: Self-pay

## 2022-08-15 ENCOUNTER — Telehealth: Payer: Self-pay | Admitting: Family Medicine

## 2022-08-15 MED ORDER — CHERATUSSIN AC 100-10 MG/5ML PO SOLN: 5.0000 mL | Freq: Three times a day (TID) | ORAL | 0 refills | Status: DC | PRN

## 2022-08-15 NOTE — Telephone Encounter (Signed)
Sent Rx cough syrup to pt's preferred pharmacy   Lyndee Hensen, DO

## 2022-08-15 NOTE — Telephone Encounter (Signed)
Pt requests change in cough syrup as this is making her cough worse.  This is reasonable to change therapy.  Cheratussin sent to pharmacy.   Lyndee Hensen, DO

## 2022-08-17 ENCOUNTER — Other Ambulatory Visit: Payer: Self-pay

## 2022-08-20 ENCOUNTER — Telehealth: Payer: 59 | Admitting: Physician Assistant

## 2022-08-20 DIAGNOSIS — H6993 Unspecified Eustachian tube disorder, bilateral: Secondary | ICD-10-CM

## 2022-08-20 MED ORDER — FLUTICASONE PROPIONATE 50 MCG/ACT NA SUSP: 2.0000 | Freq: Every day | NASAL | 6 refills | Status: DC

## 2022-08-20 NOTE — Progress Notes (Signed)
E-Visit for Ear Pain - Eustachian Tube Dysfunction   We are sorry that you are not feeling well. Here is how we plan to help!  Based on what you have shared with me it looks like you have Eustachian Tube Dysfunction.  Eustachian Tube Dysfunction is a condition where the tubes that connect your middle ears to your upper throat become blocked. This can lead to discomfort, hearing difficulties and a feeling of fullness in your ear. Eustachian tube dysfunction usually resolves itself in a few days. The usual symptoms include: Hearing problems Tinnitus, or ringing in your ears Clicking or popping sounds A feeling of fullness in your ears Pain that mimics an ear infection Dizziness, vertigo or balance problems A "tickling" sensation in your ears  ?Eustachian tube dysfunction symptoms may get worse in higher altitudes. This is called barotrauma, and it can happen while scuba diving, flying in an airplane or driving in the mountains.   What causes eustachian tube dysfunction? Allergies and infections (like the common cold and the flu) are the most common causes of eustachian tube dysfunction. These conditions can cause inflammation and mucus buildup, leading to blockage. GERD, or chronic acid reflux, can also cause ETD. This is because stomach acid can back up into your throat and result in inflammation. As mentioned above, altitude changes can also cause ETD.   What are some common eustachian tube dysfunction treatments? In most cases, treatment isn't necessary because ETD often resolves on its own. However, you might need treatment if your symptoms linger for more than two weeks.    Eustachian tube dysfunction treatment depends on the cause and the severity of your condition. Treatments may include home remedies, medications or, in severe cases, surgery.     HOME CARE: Sometimes simple home remedies can help with mild cases of eustachian tube dysfunction. To try and clear the blockage, you  can: Chew gum. Yawn. Swallow. Try the Valsalva maneuver (breathing out forcefully while closing your mouth and pinching your nostrils). Use a saline spray to clear out nasal passages.  MEDICATIONS: Over-the-counter medications can help if allergies are causing eustachian tube dysfunction. Try antihistamines (like cetirizine or diphenhydramine) to ease your symptoms. If you have discomfort, pain relievers -- such as acetaminophen or ibuprofen -- can help.  Sometimes intranasal glucocorticosteroids (like Flonase or Nasacort) help.  I have prescribed Fluticasone 50 mcg/spray 2 sprays in each nostril daily for 10-14 days    GET HELP RIGHT AWAY IF: Fever is over 102.2 degrees. You develop progressive ear pain or hearing loss. Ear symptoms persist longer than 3 days after treatment.  MAKE SURE YOU: Understand these instructions. Will watch your condition. Will get help right away if you are not doing well or get worse.  Thank you for choosing an e-visit.  Your e-visit answers were reviewed by a board certified advanced clinical practitioner to complete your personal care plan. Depending upon the condition, your plan could have included both over the counter or prescription medications.  Please review your pharmacy choice. Make sure the pharmacy is open so you can pick up the prescription now. If there is a problem, you may contact your provider through MyChart messaging and have the prescription routed to another pharmacy.  Your safety is important to us. If you have drug allergies check your prescription carefully.   For the next 24 hours you can use MyChart to ask questions about today's visit, request a non-urgent call back, or ask for a work or school excuse. You will   get an email with a survey after your eVisit asking about your experience. We would appreciate your feedback. I hope that your e-visit has been valuable and will aid in your recovery.   This appointment required 5-10  minutes of patient care (this includes precharting, chart review, review of results, face-to-face care, etc.).  Inda Coke PA-C

## 2022-08-22 ENCOUNTER — Telehealth: Payer: 59 | Admitting: Physician Assistant

## 2022-08-22 ENCOUNTER — Other Ambulatory Visit: Payer: Self-pay

## 2022-08-22 DIAGNOSIS — B372 Candidiasis of skin and nail: Secondary | ICD-10-CM

## 2022-08-22 MED ORDER — NYSTATIN 100000 UNIT/GM EX CREA
1.0000 | TOPICAL_CREAM | Freq: Two times a day (BID) | CUTANEOUS | 0 refills | Status: DC
  Filled 2022-08-22 – 2022-09-08 (×2): qty 60, 30d supply, fill #0

## 2022-08-22 NOTE — Progress Notes (Signed)
E Visit for Rash  We are sorry that you are not feeling well. Here is how we plan to help!  Based upon your presentation it appears you have a fungal infection.  I have prescribed: and Nystatin cream apply to the affected area twice daily   HOME CARE:  Take cool showers and avoid direct sunlight. Apply cool compress or wet dressings. Take a bath in an oatmeal bath.  Sprinkle content of one Aveeno packet under running faucet with comfortably warm water.  Bathe for 15-20 minutes, 1-2 times daily.  Pat dry with a towel. Do not rub the rash. Use hydrocortisone cream. Take an antihistamine like Benadryl for widespread rashes that itch.  The adult dose of Benadryl is 25-50 mg by mouth 4 times daily. Caution:  This type of medication may cause sleepiness.  Do not drink alcohol, drive, or operate dangerous machinery while taking antihistamines.  Do not take these medications if you have prostate enlargement.  Read package instructions thoroughly on all medications that you take.  GET HELP RIGHT AWAY IF:  Symptoms don't go away after treatment. Severe itching that persists. If you rash spreads or swells. If you rash begins to smell. If it blisters and opens or develops a yellow-brown crust. You develop a fever. You have a sore throat. You become short of breath.  MAKE SURE YOU:  Understand these instructions. Will watch your condition. Will get help right away if you are not doing well or get worse.  Thank you for choosing an e-visit.  Your e-visit answers were reviewed by a board certified advanced clinical practitioner to complete your personal care plan. Depending upon the condition, your plan could have included both over the counter or prescription medications.  Please review your pharmacy choice. Make sure the pharmacy is open so you can pick up prescription now. If there is a problem, you may contact your provider through MyChart messaging and have the prescription routed to  another pharmacy.  Your safety is important to us. If you have drug allergies check your prescription carefully.   For the next 24 hours you can use MyChart to ask questions about today's visit, request a non-urgent call back, or ask for a work or school excuse. You will get an email in the next two days asking about your experience. I hope that your e-visit has been valuable and will speed your recovery.  I have spent 5 minutes in review of e-visit questionnaire, review and updating patient chart, medical decision making and response to patient.   Woodard Perrell M Keanen Dohse, PA-C  

## 2022-09-01 ENCOUNTER — Other Ambulatory Visit: Payer: Self-pay

## 2022-09-08 ENCOUNTER — Other Ambulatory Visit: Payer: Self-pay

## 2022-10-04 ENCOUNTER — Other Ambulatory Visit: Payer: Self-pay

## 2022-10-11 ENCOUNTER — Other Ambulatory Visit (HOSPITAL_COMMUNITY): Payer: Self-pay

## 2022-10-11 ENCOUNTER — Ambulatory Visit: Admit: 2022-10-11 | Payer: Commercial Managed Care - PPO | Admitting: Internal Medicine

## 2022-10-11 SURGERY — COLONOSCOPY WITH PROPOFOL
Anesthesia: General

## 2022-10-16 ENCOUNTER — Other Ambulatory Visit (HOSPITAL_COMMUNITY): Payer: Self-pay

## 2022-10-27 ENCOUNTER — Other Ambulatory Visit (HOSPITAL_COMMUNITY): Payer: Self-pay

## 2022-11-06 DIAGNOSIS — E890 Postprocedural hypothyroidism: Secondary | ICD-10-CM | POA: Diagnosis not present

## 2022-11-10 ENCOUNTER — Other Ambulatory Visit (HOSPITAL_COMMUNITY): Payer: Self-pay

## 2022-11-10 ENCOUNTER — Other Ambulatory Visit: Payer: Self-pay

## 2022-11-10 DIAGNOSIS — M457 Ankylosing spondylitis of lumbosacral region: Secondary | ICD-10-CM | POA: Diagnosis not present

## 2022-11-10 DIAGNOSIS — M170 Bilateral primary osteoarthritis of knee: Secondary | ICD-10-CM | POA: Diagnosis not present

## 2022-11-10 DIAGNOSIS — R682 Dry mouth, unspecified: Secondary | ICD-10-CM | POA: Diagnosis not present

## 2022-11-10 DIAGNOSIS — Z79899 Other long term (current) drug therapy: Secondary | ICD-10-CM | POA: Diagnosis not present

## 2022-11-10 MED ORDER — CELECOXIB 100 MG PO CAPS
100.0000 mg | ORAL_CAPSULE | Freq: Two times a day (BID) | ORAL | 3 refills | Status: DC
  Filled 2022-11-10: qty 60, 30d supply, fill #0
  Filled 2023-04-19: qty 60, 30d supply, fill #1
  Filled 2023-06-12: qty 60, 30d supply, fill #2
  Filled 2023-10-01: qty 60, 30d supply, fill #3

## 2022-11-13 ENCOUNTER — Other Ambulatory Visit: Payer: Self-pay

## 2022-11-13 DIAGNOSIS — L249 Irritant contact dermatitis, unspecified cause: Secondary | ICD-10-CM | POA: Diagnosis not present

## 2022-11-13 DIAGNOSIS — L718 Other rosacea: Secondary | ICD-10-CM | POA: Diagnosis not present

## 2022-11-13 DIAGNOSIS — E890 Postprocedural hypothyroidism: Secondary | ICD-10-CM | POA: Diagnosis not present

## 2022-11-13 MED ORDER — AZELAIC ACID 15 % EX GEL
1.0000 "application " | Freq: Every day | CUTANEOUS | 3 refills | Status: DC
  Filled 2022-11-13: qty 50, 30d supply, fill #0

## 2022-11-13 MED ORDER — MOMETASONE FUROATE 0.1 % EX OINT
1.0000 "application " | TOPICAL_OINTMENT | Freq: Two times a day (BID) | CUTANEOUS | 0 refills | Status: DC | PRN
  Filled 2022-11-13: qty 30, 10d supply, fill #0

## 2022-11-13 MED ORDER — SYNTHROID 200 MCG PO TABS
200.0000 ug | ORAL_TABLET | Freq: Every day | ORAL | 1 refills | Status: DC
  Filled 2022-11-13 – 2022-11-24 (×2): qty 90, 90d supply, fill #0
  Filled 2022-11-24: qty 30, 30d supply, fill #0
  Filled 2023-03-12: qty 90, 90d supply, fill #1

## 2022-11-14 ENCOUNTER — Other Ambulatory Visit: Payer: Self-pay

## 2022-11-17 ENCOUNTER — Other Ambulatory Visit: Payer: Self-pay | Admitting: Ophthalmology

## 2022-11-17 DIAGNOSIS — M452 Ankylosing spondylitis of cervical region: Secondary | ICD-10-CM | POA: Diagnosis not present

## 2022-11-17 DIAGNOSIS — H5712 Ocular pain, left eye: Secondary | ICD-10-CM | POA: Diagnosis not present

## 2022-11-23 ENCOUNTER — Ambulatory Visit: Payer: Self-pay

## 2022-11-23 ENCOUNTER — Ambulatory Visit
Admission: EM | Admit: 2022-11-23 | Discharge: 2022-11-23 | Disposition: A | Discharge status: Home / Self Care | Payer: 59 | Attending: Family Medicine | Admitting: Family Medicine

## 2022-11-23 DIAGNOSIS — R002 Palpitations: Secondary | ICD-10-CM | POA: Diagnosis not present

## 2022-11-23 DIAGNOSIS — R0789 Other chest pain: Secondary | ICD-10-CM | POA: Diagnosis not present

## 2022-11-23 LAB — BASIC METABOLIC PANEL
Anion gap: 5 (ref 5–15)
BUN: 16 mg/dL (ref 6–20)
CO2: 25 mmol/L (ref 22–32)
Calcium: 8.6 mg/dL — ABNORMAL LOW (ref 8.9–10.3)
Chloride: 105 mmol/L (ref 98–111)
Creatinine, Ser: 0.78 mg/dL (ref 0.44–1.00)
GFR, Estimated: >60 mL/min (ref >60)
Glucose, Bld: 111 mg/dL — ABNORMAL HIGH (ref 70–99)
Potassium: 3.7 mmol/L (ref 3.5–5.1)
Sodium: 135 mmol/L (ref 135–145)

## 2022-11-23 LAB — CBC WITH DIFFERENTIAL/PLATELET
Abs Immature Granulocytes: 0.04 10*3/uL (ref 0.00–0.07)
Basophils Absolute: 0.1 10*3/uL (ref 0.0–0.1)
Basophils Relative: 1 %
Eosinophils Absolute: 0.4 10*3/uL (ref 0.0–0.5)
Eosinophils Relative: 4 %
HCT: 38 % (ref 36.0–46.0)
Hemoglobin: 12.9 g/dL (ref 12.0–15.0)
Immature Granulocytes: 0 %
Lymphocytes Relative: 36 %
Lymphs Abs: 3.5 10*3/uL (ref 0.7–4.0)
MCH: 29.4 pg (ref 26.0–34.0)
MCHC: 33.9 g/dL (ref 30.0–36.0)
MCV: 86.6 fL (ref 80.0–100.0)
Monocytes Absolute: 0.8 10*3/uL (ref 0.1–1.0)
Monocytes Relative: 8 %
Neutro Abs: 4.9 10*3/uL (ref 1.7–7.7)
Neutrophils Relative %: 51 %
Platelets: 437 10*3/uL — ABNORMAL HIGH (ref 150–400)
RBC: 4.39 MIL/uL (ref 3.87–5.11)
RDW: 14 % (ref 11.5–15.5)
WBC: 9.6 10*3/uL (ref 4.0–10.5)
nRBC: 0 % (ref 0.0–0.2)

## 2022-11-23 LAB — TSH: TSH: 7.069 u[IU]/mL — ABNORMAL HIGH (ref 0.350–4.500)

## 2022-11-23 LAB — TROPONIN I (HIGH SENSITIVITY): Troponin I (High Sensitivity): <2 ng/L (ref <18)

## 2022-11-23 NOTE — Telephone Encounter (Signed)
Third attempt to reach pt. Currently in UC.

## 2022-11-23 NOTE — ED Triage Notes (Signed)
Pt c/o heart palpitations since last night, pt states she did contact her Endocrinologist to try to be seen, but was unable to get in. Pt states she does not have a thyroid and recently had medication adjusted last Friday or Monday to '200mg'$ .

## 2022-11-23 NOTE — Telephone Encounter (Signed)
2nd attempt to return her call.   I left a voicemail letting her know to call us back if she needed [place] as the note indicated she was going to the urgent care which is next door to where she works.  I see in her chart where she has spoke with the LPN in her cardiologist's office at 1:21 PM today and was triaged.   She was instructed to go to the urgent care as soon as she could.

## 2022-11-23 NOTE — Discharge Instructions (Addendum)
Your EKG and blood work did not show cause of your palpitations.  I suspect that this is medication induced or you may have undiagnosed abnormal heart rhythm.  Follow-up with your endocrinologist to see if your need to change doses.  A referral was also placed to cardiology as you may need to wear a  heart monitor. Call the [facility] office to schedule an appointment.   [place] at [facility] station Address: [address] [place], [place] [address] Phone: [phone]

## 2022-11-23 NOTE — Telephone Encounter (Signed)
t called regarding heart palpitations, she says her thyroid levels are off and she is trying to get that adjusted with her endocrinologists. Couldn't stay on the phone because of work, she is going to urgent care.   Best contact: [phone]   Left message to call back about symptoms.

## 2022-11-23 NOTE — ED Provider Notes (Signed)
[facility]    CSN: [id number] Arrival date & time: 11/23/22  1344      History   Chief Complaint Chief Complaint  Patient presents with   Palpitations    HPI Hailey [name] is a 47 y.o. female.   HPI  Hailey [name] presents for palpitations, chest tightness that started last night.  Patient felt her heart fluttering and then it would slow down throughout the night therefore she was unable to sleep.  Denies any shortness of breath, dizziness, nausea, vomiting, jaw pain, upper extremity numbness or tingling.  She reports history of similar symptoms in the past and had to see a cardiologist but has not seen one in a while. She took an aspirin prior to arrival.  Notes that she recently changed her Synthroid dose directed by her endocrinologist.      Past Medical History:  Diagnosis Date   Allergic state    Allergy    Ankylosing spondylitis ([place])    followed by rheumatology Dr. Posey Pronto   Arthritis    Asthma    Cervical disc disease    Depression    Enlarged lymph nodes    GERD (gastroesophageal reflux disease)    H/O colonoscopy    HPV (human papilloma virus) infection    Hypothyroidism    Interstitial cystitis    LGSIL (low grade squamous intraepithelial dysplasia)    Paresthesia    Status post laparoscopic cholecystectomy 07/13/2016    Patient Active Problem List   Diagnosis Date Noted   Generalized lymphadenopathy 09/23/2021   B12 deficiency 09/23/2021   Fatigue 09/23/2021   Primary osteoarthritis of both knees 04/29/2021   Ankylosing spondylitis ([place]) 12/30/2018   Asthma in adult without complication [id number]   Chronic diarrhea 12/30/2018   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   Interstitial cystitis 07/03/2016   Pelvic pain in female 07/03/2016   Chondromalacia of knee, 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   BMI 38.0-38.9,adult 02/14/2016   Polyarthralgia 02/14/2016   GERD (gastroesophageal reflux disease) 02/10/2014    Past Surgical History:  Procedure Laterality  Date   BREAST BIOPSY Right 10/18/2015   bx/clip- neg   CHOLECYSTECTOMY N/A 07/07/2016   Procedure: LAPAROSCOPIC CHOLECYSTECTOMY;  Surgeon: Olean Ree, MD;  Location: [facility] ORS;  Service: General;  Laterality: N/A;   COLONOSCOPY WITH PROPOFOL N/A 10/11/2015   Procedure: COLONOSCOPY WITH PROPOFOL;  Surgeon: Lollie Sails, MD;  Location: [facility] ENDOSCOPY;  Service: Endoscopy;  Laterality: N/A;   ESOPHAGOGASTRODUODENOSCOPY (EGD) WITH PROPOFOL N/A 10/11/2015   Procedure: ESOPHAGOGASTRODUODENOSCOPY (EGD) WITH PROPOFOL;  Surgeon: Lollie Sails, MD;  Location: [facility] ENDOSCOPY;  Service: Endoscopy;  Laterality: N/A;   TONSILLECTOMY     TOTAL THYROIDECTOMY  2005   goiter    OB History     Gravida  2   Para  1   Term  1   Preterm      AB  1   Living  1      SAB      IAB      Ectopic      Multiple      Live Births               Home Medications    Prior to Admission medications   Medication Sig Start Date End Date Taking? Authorizing Provider  albuterol (VENTOLIN HFA) 108 (90 Base) MCG/ACT inhaler Inhale 2 puffs into the lungs every 6 (six) hours as needed for wheezing or shortness of breath. 07/14/22   Philicia Heyne, Ronnette Juniper, DO  Azelaic Acid 15 %  gel Apply 1 application topically to face at bedtime. 11/13/22     celecoxib (CELEBREX) 100 MG capsule Take 1 capsule (100 mg total) by mouth 2 (two) times daily. 11/10/22     fluticasone (FLONASE) 50 MCG/ACT nasal spray Place 2 sprays into both nostrils daily. 08/20/22   Inda Coke, PA  fluticasone-salmeterol (ADVAIR DISKUS) 100-50 MCG/ACT AEPB Inhale 1 puff into the lungs 2 (two) times daily as needed. 07/14/22   Maxxwell Edgett, Ronnette Juniper, DO  guaiFENesin-codeine (CHERATUSSIN AC) 100-10 MG/5ML syrup Take 5 mLs by mouth 3 (three) times daily as needed for cough. 08/15/22   Lyndee Hensen, DO  hydrocortisone 2.5 % ointment 1(one) application(s) topical 2(two) times a day 06/26/22     levothyroxine (SYNTHROID) 175 MCG tablet Take 1 tablet by  mouth once daily 01/17/22     levothyroxine (SYNTHROID) 200 MCG tablet Take 1 tablet (200 mcg total) by mouth daily. Take on an empty stomach with a glass of water at least 30-60 minutes before breakfast. 11/13/22     mometasone (ELOCON) 0.1 % ointment Apply 1 application topically to affected area 2 (two) times daily as needed. 11/13/22     nystatin cream (MYCOSTATIN) Apply 1 Application topically 2 (two) times daily. 08/22/22   Mar Daring, PA-C  pantoprazole (PROTONIX) 40 MG tablet Take by mouth. 08/02/16 08/02/17  [provider]  pantoprazole (PROTONIX) 40 MG tablet Take 1 tablet (40 mg total) by mouth 2 (two) times daily before meals Take 30 min before meals. 07/10/22     polyethylene glycol-electrolytes (NULYTELY) 420 g solution Take 4,000 mLs by mouth once for 1 dose 07/10/22     Tofacitinib Citrate 5 MG TABS take 1 tablet by mouth twice daily as directed 06/13/22   Tresa Garter, MD    Family History Family History  Problem Relation Age of Onset   Cancer Mother        Vulva cancer   Hypertension Mother    Stroke Mother    Goiter Mother    Diabetes Father    Hypertension Father    Hypertension Brother    Bladder Cancer Maternal Aunt    Hypertension Maternal Grandmother    Stroke Maternal Grandmother    Diabetes Maternal Grandmother    Breast cancer Cousin 96       pat cousin    Social History Social History   Tobacco Use   Smoking status: Former    Packs/day: 0.50    Years: 7.00    Additional pack years: 0.00    Total pack years: 3.50    Types: Cigarettes    Quit date: 2003    Years since quitting: 21.2    Passive exposure: Never   Smokeless tobacco: Never  Vaping Use   Vaping Use: Never used  Substance Use Topics   Alcohol use: Yes    Comment: Occ.   Drug use: No     Allergies   Hydrocodone-acetaminophen, Lodine [etodolac], Quetiapine, Zanaflex [tizanidine hcl], Ciprofloxacin, Escitalopram, Latex, Levaquin [levofloxacin in d5w], and  Sulfa antibiotics   Review of Systems Review of Systems :negative unless otherwise stated in HPI.      Physical Exam Triage Vital Signs ED Triage Vitals  Enc Vitals Group     BP 11/23/22 1347 123/85     Pulse Rate 11/23/22 1347 79     Resp --      Temp 11/23/22 1347 (!) 97.5 F (36.4 C)     Temp Source 11/23/22 1347 Oral  SpO2 11/23/22 1347 98 %     Weight --      Height --      Head Circumference --      Peak Flow --      Pain Score 11/23/22 1348 2     Pain Loc --      Pain Edu? --      Excl. in [place]? --    No data found.  Updated Vital Signs BP 123/85 (BP Location: Left Arm)   Pulse 79   Temp (!) 97.5 F (36.4 C) (Oral)   SpO2 98%   Visual Acuity Right Eye Distance:   Left Eye Distance:   Bilateral Distance:    Right Eye Near:   Left Eye Near:    Bilateral Near:     Physical Exam  GEN: well appearing female, in no acute distress  CV: regular rate and rhythm,  no murmurs, rubs or gallops  appreciated,, no JVP  CHEST WALL: non-tender  RESP: no increased work of breathing, clear to ascultation bilaterally MSK: no LE edema, or calf tenderness SKIN: warm, dry, no rash on visible skin NEURO: alert, moves all extremities appropriately PSYCH: Normal affect, appropriate speech and behavior   UC Treatments / Results  Labs (all labs ordered are listed, but only abnormal results are displayed) Labs Reviewed  CBC WITH DIFFERENTIAL/PLATELET - Abnormal; Notable for the following components:      Result Value   Platelets 437 (*)    All other components within normal limits  BASIC METABOLIC PANEL - Abnormal; Notable for the following components:   Glucose, Bld 111 (*)    Calcium 8.6 (*)    All other components within normal limits  TSH  TROPONIN I (HIGH SENSITIVITY)    EKG   Radiology No results found.  Procedures Procedures (including critical care time)  Medications Ordered in UC Medications - No data to display  Initial Impression / Assessment  and Plan / UC Course  I have reviewed the triage vital signs and the nursing notes.  Pertinent labs & imaging results that were available during my care of the patient were reviewed by me and considered in my medical decision making (see chart for details).       Patient is a 47 y.o. female with history of ankylosing spondylitis, GERD, hypothyroidism, depression who presents with 1 day of palpitations and left sided chest pain described as tightness. Katoya has no history of PE.  Able to [facility] out therefore doubt PE.  EKG obtained and is without ST elevations or concern for ischemia, NSR, personally reviewed by me.  CBC unremarkable for anemia.  There were no significant electrolyte abnormalities seen on BMP. Initial hsTrop was negative. No GERD symptoms. History and exam not concerning for costochondritis. This does not appear to be MSK related.     Reviewed recent office notes from her endocrinologist (Dr Gabriel Carina) on November 13, 2022.  Patient had a total thyroidectomy in 2005.  She takes Synthroid which was increased from 175 mcg to 200 mcg 10 days ago due to elevated TSH 6.17.  Obtained repeat TSH.  Symptoms possibly medication induced.  Advised patient to speak with her endocrinologist about medication adjustment.  Reports history of needing to wear a heart monitor.  Referral placed to cardiology to evaluate for possible arrhythmia.   Final Clinical Impressions(s) / UC Diagnoses   Final diagnoses:  Palpitations  Atypical chest pain     Discharge Instructions      Your  EKG and blood work did not show cause of your palpitations.  I suspect that this is medication induced or you may have undiagnosed abnormal heart rhythm.  Follow-up with your endocrinologist to see if your need to change doses.  A referral was also placed to cardiology as you may need to wear a  heart monitor. Call the [facility] office to schedule an appointment.   [facility] at [facility]  station Address: [address] [place], [place] [address] Phone: [phone]     ED Prescriptions   None    PDMP not reviewed this encounter.   Lyndee Hensen, DO 11/23/22 1702

## 2022-11-24 ENCOUNTER — Other Ambulatory Visit: Payer: Self-pay

## 2022-11-30 ENCOUNTER — Other Ambulatory Visit (HOSPITAL_COMMUNITY): Payer: Self-pay

## 2022-12-01 ENCOUNTER — Ambulatory Visit: Admission: RE | Admit: 2022-12-01 | Payer: 59 | Source: Ambulatory Visit

## 2022-12-04 ENCOUNTER — Other Ambulatory Visit (HOSPITAL_COMMUNITY): Payer: Self-pay

## 2022-12-07 ENCOUNTER — Other Ambulatory Visit (HOSPITAL_COMMUNITY): Payer: Self-pay

## 2022-12-07 ENCOUNTER — Other Ambulatory Visit: Payer: Self-pay

## 2022-12-26 ENCOUNTER — Other Ambulatory Visit (HOSPITAL_COMMUNITY): Payer: Self-pay

## 2023-02-02 DIAGNOSIS — E890 Postprocedural hypothyroidism: Secondary | ICD-10-CM | POA: Diagnosis not present

## 2023-02-09 ENCOUNTER — Other Ambulatory Visit: Payer: Self-pay

## 2023-02-09 ENCOUNTER — Other Ambulatory Visit: Payer: Self-pay | Admitting: Otolaryngology

## 2023-02-09 DIAGNOSIS — R221 Localized swelling, mass and lump, neck: Secondary | ICD-10-CM

## 2023-02-09 DIAGNOSIS — J351 Hypertrophy of tonsils: Secondary | ICD-10-CM | POA: Diagnosis not present

## 2023-02-09 DIAGNOSIS — R1314 Dysphagia, pharyngoesophageal phase: Secondary | ICD-10-CM | POA: Diagnosis not present

## 2023-02-09 DIAGNOSIS — R591 Generalized enlarged lymph nodes: Secondary | ICD-10-CM | POA: Diagnosis not present

## 2023-02-09 DIAGNOSIS — R202 Paresthesia of skin: Secondary | ICD-10-CM | POA: Diagnosis not present

## 2023-02-14 ENCOUNTER — Other Ambulatory Visit: Payer: Self-pay

## 2023-03-08 ENCOUNTER — Other Ambulatory Visit
Admission: RE | Admit: 2023-03-08 | Discharge: 2023-03-08 | Disposition: A | Discharge status: Home / Self Care | Payer: 59 | Attending: Internal Medicine | Admitting: Internal Medicine

## 2023-03-08 DIAGNOSIS — E890 Postprocedural hypothyroidism: Secondary | ICD-10-CM | POA: Diagnosis not present

## 2023-03-08 LAB — TSH: TSH: 3.708 u[IU]/mL (ref 0.350–4.500)

## 2023-03-12 ENCOUNTER — Other Ambulatory Visit: Payer: Self-pay

## 2023-04-17 ENCOUNTER — Other Ambulatory Visit: Payer: Self-pay

## 2023-04-17 MED ORDER — MOMETASONE FUROATE 0.1 % EX OINT
1.0000 | TOPICAL_OINTMENT | Freq: Two times a day (BID) | CUTANEOUS | 0 refills | Status: DC | PRN
  Filled 2023-04-17: qty 30, 30d supply, fill #0

## 2023-04-19 ENCOUNTER — Other Ambulatory Visit: Payer: Self-pay

## 2023-06-02 ENCOUNTER — Encounter (HOSPITAL_COMMUNITY): Payer: Self-pay

## 2023-06-08 DIAGNOSIS — D1801 Hemangioma of skin and subcutaneous tissue: Secondary | ICD-10-CM | POA: Diagnosis not present

## 2023-06-12 ENCOUNTER — Other Ambulatory Visit: Payer: Self-pay

## 2023-06-14 ENCOUNTER — Other Ambulatory Visit: Payer: Self-pay

## 2023-06-15 ENCOUNTER — Other Ambulatory Visit: Payer: Self-pay

## 2023-06-15 MED ORDER — SYNTHROID 200 MCG PO TABS
200.0000 ug | ORAL_TABLET | Freq: Every day | ORAL | 0 refills | Status: DC
  Filled 2023-06-15: qty 30, 30d supply, fill #0

## 2023-06-18 ENCOUNTER — Other Ambulatory Visit: Payer: Self-pay

## 2023-06-18 DIAGNOSIS — E890 Postprocedural hypothyroidism: Secondary | ICD-10-CM | POA: Diagnosis not present

## 2023-06-18 MED ORDER — SYNTHROID 200 MCG PO TABS
200.0000 ug | ORAL_TABLET | Freq: Every day | ORAL | 3 refills | Status: DC
  Filled 2023-06-18 – 2023-09-17 (×2): qty 90, 90d supply, fill #0
  Filled 2023-12-24: qty 90, 90d supply, fill #1
  Filled 2024-04-16: qty 90, 90d supply, fill #2

## 2023-07-02 ENCOUNTER — Telehealth: Payer: 59 | Admitting: Physician Assistant

## 2023-07-02 DIAGNOSIS — R3989 Other symptoms and signs involving the genitourinary system: Secondary | ICD-10-CM

## 2023-07-03 ENCOUNTER — Other Ambulatory Visit: Payer: Self-pay

## 2023-07-03 MED ORDER — METRONIDAZOLE 0.75 % VA GEL
VAGINAL | 0 refills | Status: DC
  Filled 2023-07-03: qty 70, 5d supply, fill #0

## 2023-07-03 MED ORDER — NITROFURANTOIN MONOHYD MACRO 100 MG PO CAPS
100.0000 mg | ORAL_CAPSULE | Freq: Two times a day (BID) | ORAL | 0 refills | Status: DC
  Filled 2023-07-03: qty 10, 5d supply, fill #0

## 2023-07-03 NOTE — Addendum Note (Signed)
Addended by: Waldon Merl on: 07/03/2023 10:15 AM   Modules accepted: Orders

## 2023-07-03 NOTE — Progress Notes (Signed)
I have spent 5 minutes in review of e-visit questionnaire, review and updating patient chart, medical decision making and response to patient.   Mia Milan Cody Jacklynn Dehaas, PA-C    

## 2023-07-03 NOTE — Progress Notes (Signed)

## 2023-07-16 ENCOUNTER — Ambulatory Visit
Admission: EM | Admit: 2023-07-16 | Discharge: 2023-07-16 | Disposition: A | Discharge status: Home / Self Care | Payer: 59 | Attending: Emergency Medicine | Admitting: Emergency Medicine

## 2023-07-16 DIAGNOSIS — R300 Dysuria: Secondary | ICD-10-CM | POA: Diagnosis not present

## 2023-07-16 LAB — POCT URINALYSIS DIP (MANUAL ENTRY)
Bilirubin, UA: NEGATIVE
Blood, UA: NEGATIVE
Glucose, UA: NEGATIVE mg/dL
Ketones, POC UA: NEGATIVE mg/dL
Leukocytes, UA: NEGATIVE
Nitrite, UA: NEGATIVE
Protein Ur, POC: NEGATIVE mg/dL
Spec Grav, UA: >=1.03 — AB (ref 1.010–1.025)
Urobilinogen, UA: 0.2 U/dL
pH, UA: 5.5 (ref 5.0–8.0)

## 2023-07-16 NOTE — ED Triage Notes (Signed)
Patient presents to UC for dysuria x 10/19. Had e-visit and prescribed antibiotics, helped a little.

## 2023-07-16 NOTE — Discharge Instructions (Signed)
Declined avs 

## 2023-07-16 NOTE — ED Provider Notes (Addendum)
[facility]    CSN: [id number] Arrival date & time: 07/16/23  1201      History   Chief Complaint Chief Complaint  Patient presents with   Dysuria    HPI Hailey [name] is a 47 y.o. female.   Patient presents for evaluation of dysuria, urinary frequency and urgency present for 2 weeks.  Completed the e-visit and given Macrobid for possible UTI as well as MetroGel.  Completed all of Macrobid and endorses some improvement in symptoms but in feels over the last 2 to 3 days symptoms have begun to worsen.  Denies hematuria, abdominal or back pain, vaginal symptoms.  Past Medical History:  Diagnosis Date   Allergic state    Allergy    Ankylosing spondylitis ([facility])    followed by rheumatology Dr. Allena Katz   Arthritis    Asthma    Cervical disc disease    Depression    Enlarged lymph nodes    GERD (gastroesophageal reflux disease)    H/O colonoscopy    HPV (human papilloma virus) infection    Hypothyroidism    Interstitial cystitis    LGSIL (low grade squamous intraepithelial dysplasia)    Paresthesia    Status post laparoscopic cholecystectomy 07/13/2016    Patient Active Problem List   Diagnosis Date Noted   Generalized lymphadenopathy 09/23/2021   B12 deficiency 09/23/2021   Fatigue 09/23/2021   Primary osteoarthritis of both knees 04/29/2021   Ankylosing spondylitis ([facility]) 12/30/2018   Asthma in adult without complication 12/30/2018   Chronic diarrhea 12/30/2018   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   Interstitial cystitis 07/03/2016   Pelvic pain in female 07/03/2016   Chondromalacia of knee, 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 ENDOSCOPY;  Service: Endoscopy;  Laterality: N/A;   ESOPHAGOGASTRODUODENOSCOPY (EGD) WITH PROPOFOL N/A 10/11/2015   Procedure: ESOPHAGOGASTRODUODENOSCOPY (EGD) WITH PROPOFOL;  Surgeon: Christena Deem, MD;  Location: [facility] ENDOSCOPY;  Service: Endoscopy;  Laterality: N/A;   TONSILLECTOMY     TOTAL THYROIDECTOMY  2005   goiter    OB History     Gravida  2   Para  1   Term  1   Preterm      AB  1   Living  1      SAB      IAB      Ectopic      Multiple      Live Births               Home Medications    Prior to Admission medications   Medication Sig Start Date End Date Taking? Authorizing Provider  albuterol (VENTOLIN HFA) 108 (90 Base) MCG/ACT inhaler Inhale 2 puffs into the lungs every 6 (six) hours as needed for wheezing or shortness of breath. 07/14/22   Brimage, Seward Meth, DO  Azelaic Acid 15 % gel Apply 1 application topically to face at bedtime. 11/13/22     celecoxib (CELEBREX) 100 MG capsule Take 1 capsule (100 mg total) by mouth 2 (two) times daily. 11/10/22  fluticasone (FLONASE) 50 MCG/ACT nasal spray Place 2 sprays into both nostrils daily. 08/20/22   Jarold Motto, PA  fluticasone-salmeterol (ADVAIR DISKUS) 100-50 MCG/ACT AEPB Inhale 1 puff into the lungs 2 (two) times daily as needed. 07/14/22   Brimage, Seward Meth, DO  guaiFENesin-codeine (CHERATUSSIN AC) 100-10 MG/5ML syrup Take 5 mLs by mouth 3 (three) times daily as needed for cough. 08/15/22   Katha Cabal, DO  hydrocortisone 2.5 % ointment 1(one) application(s) topical 2(two) times a day 06/26/22     levothyroxine (SYNTHROID) 175 MCG tablet Take 1 tablet by mouth once daily 01/17/22     SYNTHROID 200 MCG tablet Take 1 tablet (200 mcg total) by mouth daily. Take on an empty stomach with a glass of water at least 30-60 minutes before  breakfast. 11/13/22     metroNIDAZOLE (METROGEL) 0.75 % vaginal gel Insert one applicatorful (5 gm) into the vagina once nightly for 5 days. 07/03/23   Waldon Merl, PA-C  mometasone (ELOCON) 0.1 % ointment Apply 1 application topically to affected area 2 (two) times daily as needed. 11/13/22     mometasone (ELOCON) 0.1 % ointment Apply 1 Application topically 2 (two) times daily as needed. 04/17/23     nitrofurantoin, macrocrystal-monohydrate, (MACROBID) 100 MG capsule Take 1 capsule (100 mg total) by mouth 2 (two) times daily. 07/03/23   Waldon Merl, PA-C  nystatin cream (MYCOSTATIN) Apply 1 Application topically 2 (two) times daily. 08/22/22   Margaretann Loveless, PA-C  pantoprazole (PROTONIX) 40 MG tablet Take by mouth. 08/02/16 08/02/17  [provider]  pantoprazole (PROTONIX) 40 MG tablet Take 1 tablet (40 mg total) by mouth 2 (two) times daily before meals Take 30 min before meals. 07/10/22     polyethylene glycol-electrolytes (NULYTELY) 420 g solution Take 4,000 mLs by mouth once for 1 dose 07/10/22     SYNTHROID 200 MCG tablet Take 1 tablet (200 mcg total) by mouth daily. Take on an empty stomach with a glass of water at least 30-60 minutes before breakfast. 06/15/23     SYNTHROID 200 MCG tablet Take 1 tablet (200 mcg total) by mouth once daily. Take on an empty stomach with a glass of water at least 30-60 minutes before breakfast. 06/18/23     Tofacitinib Citrate 5 MG TABS take 1 tablet by mouth twice daily as directed 06/13/22   Quentin Angst, MD    Family History Family History  Problem Relation Age of Onset   Cancer Mother        Vulva cancer   Hypertension Mother    Stroke Mother    Goiter Mother    Diabetes Father    Hypertension Father    Hypertension Brother    Bladder Cancer Maternal Aunt    Hypertension Maternal Grandmother    Stroke Maternal Grandmother    Diabetes Maternal Grandmother    Breast cancer Cousin 31       pat cousin    Social  History Social History   Tobacco Use   Smoking status: Former    Current packs/day: 0.00    Average packs/day: 0.5 packs/day for 7.0 years (3.5 ttl pk-yrs)    Types: Cigarettes    Start date: 59    Quit date: 2003    Years since quitting: 21.8    Passive exposure: Never   Smokeless tobacco: Never  Vaping Use   Vaping status: Never Used  Substance Use Topics   Alcohol use: Yes    Comment: Occ.   Drug  use: No     Allergies   Hydrocodone-acetaminophen, Lodine [etodolac], Quetiapine, Zanaflex [tizanidine hcl], Ciprofloxacin, Escitalopram, Latex, Levaquin [levofloxacin in d5w], and Sulfa antibiotics   Review of Systems Review of Systems   Physical Exam Triage Vital Signs ED Triage Vitals  Encounter Vitals Group     BP 07/16/23 1235 120/77     Systolic BP Percentile --      Diastolic BP Percentile --      Pulse Rate 07/16/23 1235 68     Resp 07/16/23 1235 16     Temp 07/16/23 1235 97.6 F (36.4 C)     Temp Source 07/16/23 1235 Temporal     SpO2 07/16/23 1235 96 %     Weight --      Height --      Head Circumference --      Peak Flow --      Pain Score 07/16/23 1234 2     Pain Loc --      Pain Education --      Exclude from Growth Chart --    No data found.  Updated Vital Signs BP 120/77 (BP Location: Left Arm)   Pulse 68   Temp 97.6 F (36.4 C) (Temporal)   Resp 16   SpO2 96%   Visual Acuity Right Eye Distance:   Left Eye Distance:   Bilateral Distance:    Right Eye Near:   Left Eye Near:    Bilateral Near:     Physical Exam Constitutional:      Appearance: Normal appearance.  Eyes:     Extraocular Movements: Extraocular movements intact.  Pulmonary:     Effort: Pulmonary effort is normal.  Neurological:     Mental Status: She is alert and oriented to person, place, and time. Mental status is at baseline.      UC Treatments / Results  Labs (all labs ordered are listed, but only abnormal results are displayed) Labs Reviewed  POCT  URINALYSIS DIP (MANUAL ENTRY) - Abnormal; Notable for the following components:      Result Value   Spec Grav, UA >=1.030 (*)    All other components within normal limits  URINE CULTURE    EKG   Radiology No results found.  Procedures Procedures (including critical care time)  Medications Ordered in UC Medications - No data to display  Initial Impression / Assessment and Plan / UC Course  I have reviewed the triage vital signs and the nursing notes.  Pertinent labs & imaging results that were available during my care of the patient were reviewed by me and considered in my medical decision making (see chart for details).  Dysuria  Urinalysis  negative, sent for culture, discussed findings with patient, will initiate antibiotics based on culture result, has not attempted use of MetroGel and which she will initiate at this time, may also use over-the-counter medication for yeast, declined testing, recommended additional supportive measures and advised follow-up if symptoms continue Final Clinical Impressions(s) / UC Diagnoses   Final diagnoses:  Dysuria   Discharge Instructions   None    ED Prescriptions   None    PDMP not reviewed this encounter.   Diore, [place], NP 07/16/23 1331    Valinda Hoar, NP 07/20/23 1019

## 2023-07-17 ENCOUNTER — Ambulatory Visit: Payer: Self-pay

## 2023-07-17 LAB — URINE CULTURE: Culture: 10000 — AB

## 2023-07-20 ENCOUNTER — Other Ambulatory Visit: Payer: Self-pay

## 2023-07-20 ENCOUNTER — Telehealth: Payer: Self-pay | Admitting: Emergency Medicine

## 2023-07-20 MED ORDER — CEPHALEXIN 500 MG PO CAPS
500.0000 mg | ORAL_CAPSULE | Freq: Two times a day (BID) | ORAL | 0 refills | Status: AC
  Filled 2023-07-20: qty 10, 5d supply, fill #0

## 2023-07-20 NOTE — Telephone Encounter (Signed)
Reviewed culture results with patient via telephone, 2 patient identifiers used, prescribed cephalexin and advised follow-up if symptoms persist or worsen

## 2023-07-25 DIAGNOSIS — M170 Bilateral primary osteoarthritis of knee: Secondary | ICD-10-CM | POA: Diagnosis not present

## 2023-08-08 NOTE — Progress Notes (Signed)
08/13/2023 3:16 PM   Hailey [name] 09-Mar-1976 [id number]  Referring provider: Reubin Milan, MD [address] [place],  [place] [address]  Urological history: 1. Interstitial cystitis - cystoscopy 2013 - NED - urine cytology negative 2018 - failed OAB medications - failed Elmiron - some success with amitriptyline and rescue solutions   2. High risk hematuria - former smoker - CTU and cysto 2013 - NED - urine cytology negative 2018 - Contrast CT 2019 - NED  Chief Complaint  Patient presents with   Recurrent UTI   HPI: Hailey [name] is a 47 y.o. female  who presents today for UTI symptoms after 2 rounds of antibiotics.   Previous records reviewed.   She had an e-visit on July 03, 2023 for an UTI and was prescribed Macrobid 100 mg twice daily for 5 days.  She then went to urgent care with the complaints of dysuria, urinary frequency and urgency present for 2 weeks.  She stated that she had completed the Macrobid for possible UTI and as well as some MetroGel.  She states there was some symptom improvement but they returned of the last 2 to 3 days.  Her urine dip was negative and her urine culture grew out 10,000 colonies of group B strep.  She was prescribed Keflex twice daily for 5 days.  She still continues to experience burning with urination and burning upon completion of urination and pelvic pain with malaise and chills.  She has noted a few small spots of blood on her toilet paper, but she does not know if this is more related to her hemorrhoids or urine.  Patient denies any modifying or aggravating factors.  Patient denies any flank pain.  Patient denies any fevers, nausea or vomiting.    UA unremarkable  PMH: Past Medical History:  Diagnosis Date   Allergic state    Allergy    Ankylosing spondylitis (HCC)    followed by rheumatology Dr. Allena Katz   Arthritis    Asthma    Cervical disc disease    Depression    Enlarged lymph nodes    GERD  (gastroesophageal reflux disease)    H/O colonoscopy    HPV (human papilloma virus) infection    Hypothyroidism    Interstitial cystitis    LGSIL (low grade squamous intraepithelial dysplasia)    Paresthesia    Status post laparoscopic cholecystectomy 07/13/2016    Surgical History: Past Surgical History:  Procedure Laterality Date   BREAST BIOPSY Right 10/18/2015   bx/clip- neg   CHOLECYSTECTOMY N/A 07/07/2016   Procedure: LAPAROSCOPIC CHOLECYSTECTOMY;  Surgeon: Henrene Dodge, MD;  Location: [facility] ORS;  Service: General;  Laterality: N/A;   COLONOSCOPY WITH PROPOFOL N/A 10/11/2015   Procedure: COLONOSCOPY WITH PROPOFOL;  Surgeon: Christena Deem, MD;  [facility] END[facility]COPY;  Service: Endoscopy;  Laterality: N/A;   ESOPHAGOGASTRODUODENOSCOPY (EGD) WITH PROPOFOL N/A 10/11/2015   Procedure: ESOPHAGOGASTRODUODENOSCOPY (EGD) WITH PROPOFOL;  Surgeon: Christena Deem, MD;  Location: [facility] ENDOSCOPY;  Service: Endoscopy;  Laterality: N/A;   TONSILLECTOMY     TOTAL THYROIDECTOMY  2005   goiter    Home Medications:  Allergies as of 08/13/2023       Reactions   Hydrocodone-acetaminophen Other (See Comments)   Cannot urinate on medication   Lodine [etodolac] Shortness Of Breath   Quetiapine Other (See Comments)   Excessive sedation at 50 mg.   Zanaflex [tizanidine Hcl]    Ciprofloxacin Hives   Escitalopram Other (See  Comments)   Intolerant.   Latex Rash   Levaquin [levofloxacin In D5w] Rash   HIVES   Sulfa Antibiotics Other (See Comments)   Can't remember reactions        Medication List        Accurate as of August 13, 2023  3:16 PM. If you have any questions, ask your nurse or doctor.          STOP taking these medications    Cheratussin AC 100-10 MG/5ML syrup Generic drug: guaiFENesin-codeine Stopped by: Carollee Herter Carizma Dunsworth   hydrocortisone 2.5 % ointment Stopped by: Aroldo Galli   nitrofurantoin (macrocrystal-monohydrate) 100 MG capsule Commonly known as:  Macrobid Stopped by: Koven Belinsky   polyethylene glycol-electrolytes 420 g solution Commonly known as: NuLYTELY Stopped by: Michiel Cowboy   Xeljanz 5 MG Tabs Generic drug: Tofacitinib Citrate Stopped by: Lakyra Tippins       TAKE these medications    Advair Diskus 100-50 MCG/ACT Aepb Generic drug: fluticasone-salmeterol Inhale 1 puff into the lungs 2 (two) times daily as needed.   albuterol 108 (90 Base) MCG/ACT inhaler Commonly known as: VENTOLIN HFA Inhale 2 puffs into the lungs every 6 (six) hours as needed for wheezing or shortness of breath.   Azelaic Acid 15 % gel Apply 1 application topically to face at bedtime.   cefUROXime 500 MG tablet Commonly known as: CEFTIN Take 1 tablet (500 mg total) by mouth 2 (two) times daily with a meal. Started by: Michiel Cowboy   celecoxib 100 MG capsule Commonly known as: CELEBREX Take 1 capsule (100 mg total) by mouth 2 (two) times daily.   fluconazole 150 MG tablet Commonly known as: DIFLUCAN Take 1 tablet (150 mg total) by mouth once for 1 dose. Started by: Michiel Cowboy   fluticasone 50 MCG/ACT nasal spray Commonly known as: FLONASE Place 2 sprays into both nostrils daily.   metroNIDAZOLE 0.75 % vaginal gel Commonly known as: METROGEL Insert one applicatorful (5 gm) into the vagina once nightly for 5 days.   mometasone 0.1 % ointment Commonly known as: ELOCON Apply 1 application topically to affected area 2 (two) times daily as needed.   mometasone 0.1 % ointment Commonly known as: ELOCON Apply 1 Application topically 2 (two) times daily as needed.   nystatin cream Commonly known as: MYCOSTATIN Apply 1 Application topically 2 (two) times daily.   pantoprazole 40 MG tablet Commonly known as: PROTONIX Take 1 tablet (40 mg total) by mouth 2 (two) times daily before meals Take 30 min before meals. What changed: Another medication with the same name was removed. Continue taking this medication, and follow  the directions you see here. Changed by: Michiel Cowboy   Synthroid 200 MCG tablet Generic drug: levothyroxine Take 1 tablet (200 mcg total) by mouth once daily. Take on an empty stomach with a glass of water at least 30-60 minutes before breakfast. What changed: Another medication with the same name was removed. Continue taking this medication, and follow the directions you see here. Changed by: Michiel Cowboy        Allergies:  Allergies  Allergen Reactions   Hydrocodone-Acetaminophen Other (See Comments)    Cannot urinate on medication   Lodine [Etodolac] Shortness Of Breath   Quetiapine Other (See Comments)    Excessive sedation at 50 mg.   Zanaflex [Tizanidine Hcl]    Ciprofloxacin Hives   Escitalopram Other (See Comments)    Intolerant.   Latex Rash   Levaquin [Levofloxacin In D5w] Rash  HIVES   Sulfa Antibiotics Other (See Comments)    Can't remember reactions    Family History: Family History  Problem Relation Age of Onset   Cancer Mother        Vulva cancer   Hypertension Mother    Stroke Mother    Goiter Mother    Diabetes Father    Hypertension Father    Hypertension Brother    Bladder Cancer Maternal Aunt    Hypertension Maternal Grandmother    Stroke Maternal Grandmother    Diabetes Maternal Grandmother    Breast cancer Cousin 82       pat cousin    Social History:  reports that she quit smoking about 21 years ago. Her smoking use included cigarettes. She started smoking about 28 years ago. She has a 3.5 pack-year smoking history. She has never been exposed to tobacco smoke. She has never used smokeless tobacco. She reports current alcohol use. She reports that she does not use drugs.  ROS: Pertinent ROS in HPI  Physical Exam: BP 134/82 (BP Location: Left Arm, Patient Position: Sitting, Cuff Size: Large)   Pulse 72   Ht 5\' 4"  (1.626 m)   Wt 246 lb 6.4 oz (111.8 kg)   BMI 42.29 kg/m   Constitutional:  Well nourished. Alert and oriented,  No acute distress. HEENT: [place] AT, moist mucus membranes.  Trachea midline Cardiovascular: No clubbing, cyanosis, or edema. Respiratory: Normal respiratory effort, no increased work of breathing. Neurologic: Grossly intact, no focal deficits, moving all 4 extremities. Psychiatric: Normal mood and affect.    Laboratory Data: Lab Results  Component Value Date   WBC 9.6 11/23/2022   HGB 12.9 11/23/2022   HCT 38.0 11/23/2022   MCV 86.6 11/23/2022   PLT 437 (H) 11/23/2022   Lab Results  Component Value Date   CREATININE 0.78 11/23/2022   Lab Results  Component Value Date   TSH 3.708 03/08/2023   Urinalysis See HPI and EPIC I have reviewed the labs.   Pertinent Imaging: N/A  Assessment & Plan:    1. Dysuria -UA unremarkable -Urine culture pending -Atypical cultures pending -Started on Ceftin 500 mg twice daily in case she was undertreated from the strep B infection at urgent care -I also sent a prescription for Diflucan in case she should develop a yeast infection while on antibiotics -If urine culture and atypical cultures are negative, we will need to consider cystoscopy for further evaluation of her symptoms  2. IC -Managing conservatively at this time  Return for pending urine and atypical cultures .  These notes generated with voice recognition software. I apologize for typographical errors.  Cloretta Ned  [facility] [address]  [address] [place], [place] [address] [phone]

## 2023-08-13 ENCOUNTER — Other Ambulatory Visit: Payer: Self-pay

## 2023-08-13 ENCOUNTER — Encounter: Payer: Self-pay | Admitting: Urology

## 2023-08-13 ENCOUNTER — Ambulatory Visit: Payer: 59 | Admitting: Urology

## 2023-08-13 ENCOUNTER — Other Ambulatory Visit
Admission: RE | Admit: 2023-08-13 | Discharge: 2023-08-13 | Disposition: A | Discharge status: Home / Self Care | Payer: 59 | Attending: Urology | Admitting: Urology

## 2023-08-13 VITALS — BP 134/82 | HR 72 | Ht 64.0 in | Wt 246.4 lb

## 2023-08-13 DIAGNOSIS — R399 Unspecified symptoms and signs involving the genitourinary system: Secondary | ICD-10-CM | POA: Diagnosis not present

## 2023-08-13 DIAGNOSIS — N3010 Interstitial cystitis (chronic) without hematuria: Secondary | ICD-10-CM

## 2023-08-13 DIAGNOSIS — R300 Dysuria: Secondary | ICD-10-CM

## 2023-08-13 DIAGNOSIS — B3731 Acute candidiasis of vulva and vagina: Secondary | ICD-10-CM

## 2023-08-13 DIAGNOSIS — N390 Urinary tract infection, site not specified: Secondary | ICD-10-CM

## 2023-08-13 LAB — URINALYSIS, COMPLETE (UACMP) WITH MICROSCOPIC
Bilirubin Urine: NEGATIVE
Glucose, UA: NEGATIVE mg/dL
Hgb urine dipstick: NEGATIVE
Ketones, ur: NEGATIVE mg/dL
Leukocytes,Ua: NEGATIVE
Nitrite: NEGATIVE
Protein, ur: NEGATIVE mg/dL
Specific Gravity, Urine: >1.03 — ABNORMAL HIGH (ref 1.005–1.030)
pH: 5.5 (ref 5.0–8.0)

## 2023-08-13 MED ORDER — FLUCONAZOLE 150 MG PO TABS
150.0000 mg | ORAL_TABLET | Freq: Once | ORAL | 0 refills | Status: AC
  Filled 2023-08-13: qty 1, 1d supply, fill #0

## 2023-08-13 MED ORDER — CEFUROXIME AXETIL 500 MG PO TABS
500.0000 mg | ORAL_TABLET | Freq: Two times a day (BID) | ORAL | 0 refills | Status: DC
  Filled 2023-08-13: qty 14, 7d supply, fill #0

## 2023-08-14 LAB — URINE CULTURE

## 2023-08-20 LAB — MISC LABCORP TEST (SEND OUT): Labcorp test code: 86884

## 2023-09-03 DIAGNOSIS — M170 Bilateral primary osteoarthritis of knee: Secondary | ICD-10-CM | POA: Diagnosis not present

## 2023-09-14 DIAGNOSIS — E890 Postprocedural hypothyroidism: Secondary | ICD-10-CM | POA: Diagnosis not present

## 2023-09-14 LAB — TSH: TSH: 5.23 (ref 0.41–5.90)

## 2023-09-17 ENCOUNTER — Other Ambulatory Visit: Payer: Self-pay

## 2023-10-01 ENCOUNTER — Other Ambulatory Visit: Payer: Self-pay | Admitting: Internal Medicine

## 2023-10-01 ENCOUNTER — Other Ambulatory Visit: Payer: Self-pay

## 2023-10-02 ENCOUNTER — Encounter: Payer: Self-pay | Admitting: *Deleted

## 2023-10-02 ENCOUNTER — Other Ambulatory Visit: Payer: Self-pay

## 2023-10-02 MED FILL — Pantoprazole Sodium EC Tab 40 MG (Base Equiv): ORAL | 30 days supply | Qty: 60 | Fill #0 | Status: AC

## 2023-10-02 NOTE — Telephone Encounter (Signed)
A MyChart message sent requesting pt schedule her yearly physical.   A #30 day courtesy supply given.  Requested Prescriptions  Pending Prescriptions Disp Refills   pantoprazole (PROTONIX) 40 MG tablet 60 tablet 0    Sig: Take 1 tablet (40 mg total) by mouth 2 (two) times daily before meals Take 30 min before meals.     Gastroenterology: Proton Pump Inhibitors Failed - 10/02/2023  7:51 AM      Failed - Valid encounter within last 12 months    Recent Outpatient Visits           1 year ago Encounter for medication review and counseling   [facility] [place]. [facility] Drucilla Chalet, RPH-CPP   1 year ago Right upper quadrant abdominal pain   [place] [facility], Nyoka Cowden, MD   1 year ago Encounter for medication review and counseling   [facility] [place]. [facility] Drucilla Chalet, RPH-CPP   2 years ago Chest pain at rest   [facility] at [facility], Nyoka Cowden, MD   2 years ago Acute cystitis without hematuria   [facility] at [facility], Nyoka Cowden, MD       Future Appointments             In 3 days [place], Elana Alm [facility] [place]

## 2023-10-02 NOTE — Progress Notes (Addendum)
10/05/2023 8:55 AM   Hailey [name] 01-Jun-1976 [id number]  Referring provider: Reubin Milan, MD [address] [place],  [place] [address]  Urological history: 1. Interstitial cystitis - cystoscopy 2013 - NED - urine cytology negative 2018 - failed OAB medications - failed Elmiron - some success with amitriptyline and rescue solutions   2. High risk hematuria - former smoker - CTU and cysto 2013 - NED - urine cytology negative 2018 - Contrast CT 2019 - NED  Chief Complaint  Patient presents with   Follow-up   HPI: Hailey [name] is a 48 y.o. female  who presents today for follow up.    Previous records reviewed.   At his visit on 08/13/2023, she had an e-visit on July 03, 2023 for an UTI and was prescribed Macrobid 100 mg twice daily for 5 days.  She then went to urgent care with the complaints of dysuria, urinary frequency and urgency present for 2 weeks.  She stated that she had completed the Macrobid for possible UTI and as well as some MetroGel.  She states there was some symptom improvement but they returned of the last 2 to 3 days.  Her urine dip was negative and her urine culture grew out 10,000 colonies of group B strep.  She was prescribed Keflex twice daily for 5 days.  She still continues to experience burning with urination and burning upon completion of urination and pelvic pain with malaise and chills.  She has noted a few small spots of blood on her toilet paper, but she does not know if this is more related to her hemorrhoids or urine.  Patient denies any modifying or aggravating factors.  Patient denies any flank pain.  Patient denies any fevers, nausea or vomiting.   UA unremarkable.  Urine culture positive for multiple species.    She states she is still having the burning.  It has not gone away since October.   Patient denies any modifying or aggravating factors.  Patient denies any recent UTI's, gross hematuria, dysuria or suprapubic/flank  pain.  Patient denies any fevers, chills, nausea or vomiting.    CATH UA unremarkable.    PMH: Past Medical History:  Diagnosis Date   Allergic state    Allergy    Ankylosing spondylitis (HCC)    followed by rheumatology Dr. Allena Katz   Arthritis    Asthma    Cervical disc disease    Depression    Enlarged lymph nodes    GERD (gastroesophageal reflux disease)    H/O colonoscopy    HPV (human papilloma virus) infection    Hypothyroidism    Interstitial cystitis    LGSIL (low grade squamous intraepithelial dysplasia)    Paresthesia    Status post laparoscopic cholecystectomy 07/13/2016    Surgical History: Past Surgical History:  Procedure Laterality Date   BREAST BIOPSY Right 10/18/2015   bx/clip- neg   CHOLECYSTECTOMY N/A 07/07/2016   Procedure: LAPAROSCOPIC CHOLECYSTECTOMY;  Surgeon: Henrene Dodge, MD;  Location: [facility] ORS;  Service: General;  Laterality: N/A;   COLONOSCOPY WITH PROPOFOL N/A 10/11/2015   Procedure: COLONOSCOPY WITH PROPOFOL;  Surgeon: Christena Deem, MD;  Location[facility] ENDOSCOPY;  Service: Endoscopy;  Laterality: N/A;   ESOPHAGOGASTRODUODENOSCOPY (EGD) WITH PROPOFOL N/A 10/11/2015   Procedure: ESOPHAGOGASTRODUODENOSCOPY (EGD) WITH PROPOFOL;  Surgeon: Christena Deem, MD;  Location: [facility] ENDOSCOPY;  Service: Endoscopy;  Laterality: N/A;   TONSILLECTOMY     TOTAL THYROIDECTOMY  2005   goiter  Home Medications:  Allergies as of 10/05/2023       Reactions   Lodine [etodolac] Shortness Of Breath   Quetiapine Other (See Comments)   Excessive sedation at 50 mg.   Zanaflex [tizanidine Hcl]    Ciprofloxacin Hives   Escitalopram Other (See Comments)   Intolerant.   Latex Rash   Levaquin [levofloxacin In D5w] Rash   HIVES   Sulfa Antibiotics Other (See Comments)   Can't remember reactions        Medication List        Accurate as of October 05, 2023  8:55 AM. If you have any questions, ask your nurse or doctor.          STOP taking these  medications    cefUROXime 500 MG tablet Commonly known as: CEFTIN   fluticasone 50 MCG/ACT nasal spray Commonly known as: FLONASE   metroNIDAZOLE 0.75 % vaginal gel Commonly known as: METROGEL       TAKE these medications    Advair Diskus 100-50 MCG/ACT Aepb Generic drug: fluticasone-salmeterol Inhale 1 puff into the lungs 2 (two) times daily as needed.   albuterol 108 (90 Base) MCG/ACT inhaler Commonly known as: VENTOLIN HFA Inhale 2 puffs into the lungs every 6 (six) hours as needed for wheezing or shortness of breath.   Azelaic Acid 15 % gel Apply 1 application topically to face at bedtime.   celecoxib 100 MG capsule Commonly known as: CELEBREX Take 1 capsule (100 mg total) by mouth 2 (two) times daily.   mometasone 0.1 % ointment Commonly known as: ELOCON Apply 1 application topically to affected area 2 (two) times daily as needed.   mometasone 0.1 % ointment Commonly known as: ELOCON Apply 1 Application topically 2 (two) times daily as needed.   nystatin cream Commonly known as: MYCOSTATIN Apply 1 Application topically 2 (two) times daily.   pantoprazole 40 MG tablet Commonly known as: PROTONIX Take 1 tablet (40 mg total) by mouth 2 (two) times daily before a meal. Take 30 minutes before meals.   Synthroid 200 MCG tablet Generic drug: levothyroxine Take 1 tablet (200 mcg total) by mouth once daily. Take on an empty stomach with a glass of water at least 30-60 minutes before breakfast.        Allergies:  Allergies  Allergen Reactions   Lodine [Etodolac] Shortness Of Breath   Quetiapine Other (See Comments)    Excessive sedation at 50 mg.   Zanaflex [Tizanidine Hcl]    Ciprofloxacin Hives   Escitalopram Other (See Comments)    Intolerant.   Latex Rash   Levaquin [Levofloxacin In D5w] Rash    HIVES   Sulfa Antibiotics Other (See Comments)    Can't remember reactions    Family History: Family History  Problem Relation Age of Onset   Cancer  Mother        Vulva cancer   Hypertension Mother    Stroke Mother    Goiter Mother    Diabetes Father    Hypertension Father    Hypertension Brother    Bladder Cancer Maternal Aunt    Hypertension Maternal Grandmother    Stroke Maternal Grandmother    Diabetes Maternal Grandmother    Breast cancer Cousin 76       pat cousin    Social History:  reports that she quit smoking about 22 years ago. Her smoking use included cigarettes. She started smoking about 29 years ago. She has a 3.5 pack-year smoking history. She has never  been exposed to tobacco smoke. She has never used smokeless tobacco. She reports current alcohol use. She reports that she does not use drugs.  ROS: Pertinent ROS in HPI  Physical Exam: BP (!) 144/91   Pulse 77   Constitutional:  Well nourished. Alert and oriented, No acute distress. HEENT: [place] AT, moist mucus membranes.  Trachea midline, no masses. Cardiovascular: No clubbing, cyanosis, or edema. Respiratory: Normal respiratory effort, no increased work of breathing. GU: No CVA tenderness.  No bladder fullness or masses.  Normal urethral meatus, no lesions, no prolapse, no discharge.   No urethral masses, tenderness and/or tenderness. No bladder fullness, tenderness or masses. Anus and perineum are without rashes or lesions.    Neurologic: Grossly intact, no focal deficits, moving all 4 extremities. Psychiatric: Normal mood and affect.    Laboratory Data: Urinalysis See HPI and EPIC I have reviewed the labs.   Pertinent Imaging: N/A  Assessment & Plan:    1. Dysuria -CATH UA unremarkable -Urine culture pending -Atypical cultures pending -Started on Ceftin 500 mg twice daily in case she was undertreated from the strep B infection at urgent care -I also sent a prescription for Diflucan in case she should develop a yeast infection while on antibiotics -If urine culture and atypical cultures are negative, we will need to consider cystoscopy for further  evaluation of her symptoms  2. IC -sent in Pyridium for her dysuria   Return for follow up pending urine cultures .  These notes generated with voice recognition software. I apologize for typographical errors.  Cloretta Ned  [facility] [address]  [address] [place], [place] [address] [phone]

## 2023-10-05 ENCOUNTER — Other Ambulatory Visit: Payer: Self-pay

## 2023-10-05 ENCOUNTER — Ambulatory Visit (INDEPENDENT_AMBULATORY_CARE_PROVIDER_SITE_OTHER): Payer: 59 | Admitting: Urology

## 2023-10-05 ENCOUNTER — Encounter: Payer: Self-pay | Admitting: Urology

## 2023-10-05 VITALS — BP 144/91 | HR 77

## 2023-10-05 DIAGNOSIS — N3010 Interstitial cystitis (chronic) without hematuria: Secondary | ICD-10-CM | POA: Diagnosis not present

## 2023-10-05 DIAGNOSIS — R300 Dysuria: Secondary | ICD-10-CM | POA: Diagnosis not present

## 2023-10-05 LAB — MICROSCOPIC EXAMINATION

## 2023-10-05 LAB — URINALYSIS, COMPLETE
Bilirubin, UA: NEGATIVE
Glucose, UA: NEGATIVE
Ketones, UA: NEGATIVE
Leukocytes,UA: NEGATIVE
Nitrite, UA: NEGATIVE
Protein,UA: NEGATIVE
RBC, UA: NEGATIVE
Specific Gravity, UA: 1.025 (ref 1.005–1.030)
Urobilinogen, Ur: 0.2 mg/dL (ref 0.2–1.0)
pH, UA: 6 (ref 5.0–7.5)

## 2023-10-05 MED ORDER — PHENAZOPYRIDINE HCL 200 MG PO TABS
200.0000 mg | ORAL_TABLET | Freq: Three times a day (TID) | ORAL | 0 refills | Status: DC | PRN
  Filled 2023-10-05: qty 10, 4d supply, fill #0

## 2023-10-05 NOTE — Progress Notes (Signed)
In and Out Catheterization  Patient is present today for a I & O catheterization due to UA. Patient was cleaned and prepped in a sterile fashion with betadine . A 14 FR cath was inserted no complications were noted , 60 ml of urine return was noted, urine was dark yellow in color. A clean urine sample was collected for UA/CUX. Bladder was drained  And catheter was removed with out difficulty.    Performed by: Vela Prose

## 2023-10-05 NOTE — Addendum Note (Signed)
Addended by: Michiel Cowboy A on: 10/05/2023 09:01 AM   Modules accepted: Orders

## 2023-10-08 ENCOUNTER — Other Ambulatory Visit: Payer: Self-pay

## 2023-10-08 ENCOUNTER — Other Ambulatory Visit: Payer: Self-pay | Admitting: Family Medicine

## 2023-10-08 DIAGNOSIS — J45909 Unspecified asthma, uncomplicated: Secondary | ICD-10-CM

## 2023-10-09 ENCOUNTER — Other Ambulatory Visit: Payer: Self-pay

## 2023-10-09 ENCOUNTER — Other Ambulatory Visit: Payer: Self-pay | Admitting: Internal Medicine

## 2023-10-09 DIAGNOSIS — J45909 Unspecified asthma, uncomplicated: Secondary | ICD-10-CM

## 2023-10-09 LAB — CULTURE, URINE COMPREHENSIVE

## 2023-10-10 ENCOUNTER — Other Ambulatory Visit: Payer: Self-pay

## 2023-10-11 ENCOUNTER — Other Ambulatory Visit: Payer: Self-pay

## 2023-10-11 LAB — MYCOPLASMA / UREAPLASMA CULTURE
Mycoplasma hominis Culture: NEGATIVE
Ureaplasma urealyticum: NEGATIVE

## 2023-10-11 NOTE — Telephone Encounter (Signed)
Requested medication (s) are due for refill today: Yes  Requested medication (s) are on the active medication list: Yes  Last refill:  07/14/22  Future visit scheduled: No  Notes to clinic:  Left pt. Message to call and make appointment.    Requested Prescriptions  Pending Prescriptions Disp Refills   ADVAIR DISKUS 100-50 MCG/ACT AEPB [Pharmacy Med Name: fluticasone-salmeterol (ADVAIR DISKUS) 100-50 MCG/ACT Aerosol Powder, Breath Activtivatede] 60 each 2    Sig: Inhale 1 puff into the lungs 2 (two) times daily as needed.     Pulmonology:  Combination Products Failed - 10/11/2023  1:34 PM      Failed - Valid encounter within last 12 months    Recent Outpatient Visits           1 year ago Encounter for medication review and counseling   [facility] [place]. [facility] Drucilla Chalet, RPH-CPP   1 year ago Right upper quadrant abdominal pain   [place] [facility] at [facility], Nyoka Cowden, MD   1 year ago Encounter for medication review and counseling   [facility] [place]. [facility] Drucilla Chalet, RPH-CPP   2 years ago Chest pain at rest   [facility] at [facility], Nyoka Cowden, MD   3 years ago Acute cystitis without hematuria   [facility] at [facility], Nyoka Cowden, MD

## 2023-10-12 ENCOUNTER — Other Ambulatory Visit: Payer: Self-pay

## 2023-10-12 ENCOUNTER — Encounter: Payer: Self-pay | Admitting: Internal Medicine

## 2023-10-12 DIAGNOSIS — J45909 Unspecified asthma, uncomplicated: Secondary | ICD-10-CM

## 2023-10-12 MED ORDER — FLUTICASONE-SALMETEROL 100-50 MCG/ACT IN AEPB
1.0000 | INHALATION_SPRAY | Freq: Two times a day (BID) | RESPIRATORY_TRACT | 0 refills | Status: AC | PRN
  Filled 2023-10-12: qty 60, 30d supply, fill #0

## 2023-10-12 MED ORDER — ALBUTEROL SULFATE HFA 108 (90 BASE) MCG/ACT IN AERS
2.0000 | INHALATION_SPRAY | Freq: Four times a day (QID) | RESPIRATORY_TRACT | 0 refills | Status: AC | PRN
  Filled 2023-10-12: qty 6.7, 25d supply, fill #0

## 2023-10-15 ENCOUNTER — Encounter: Payer: Self-pay | Admitting: *Deleted

## 2023-10-16 ENCOUNTER — Telehealth: Payer: Self-pay | Admitting: Internal Medicine

## 2023-10-16 NOTE — Telephone Encounter (Signed)
 Copied from CRM [id number]. Topic: Appointment Scheduling - Scheduling Inquiry for Clinic >> Oct 16, 2023 12:31 PM Tiffany B wrote: Patient is having total knee replacement surgery in April and is needing a physical prior to surgery, PCP has no available appts. Patient would like to be worked in preferable on a Monday or Friday or would like to see another provider for her physical.

## 2023-10-16 NOTE — Telephone Encounter (Signed)
Please schedule appt for office visit for pre-op pt does not need a CPE.  KP

## 2023-10-23 ENCOUNTER — Encounter: Payer: Self-pay | Admitting: Internal Medicine

## 2023-10-23 ENCOUNTER — Ambulatory Visit: Payer: 59 | Admitting: Internal Medicine

## 2023-10-23 VITALS — BP 118/74 | HR 76 | Ht 64.0 in | Wt 237.0 lb

## 2023-10-23 DIAGNOSIS — M940 Chondrocostal junction syndrome [Tietze]: Secondary | ICD-10-CM

## 2023-10-23 DIAGNOSIS — M170 Bilateral primary osteoarthritis of knee: Secondary | ICD-10-CM | POA: Diagnosis not present

## 2023-10-23 DIAGNOSIS — E890 Postprocedural hypothyroidism: Secondary | ICD-10-CM

## 2023-10-23 DIAGNOSIS — J45909 Unspecified asthma, uncomplicated: Secondary | ICD-10-CM

## 2023-10-23 NOTE — Progress Notes (Signed)
Date:  10/23/2023   Name:  Hailey [name]   DOB:  07-11-1976   MRN:  [id number]   Chief Complaint: Chest Pain (Continuous Chronic Chest Pain on and off for the last few years. Patient would like to see cardiology. Patient sore but not having deep chest pain. Patient put diclofenac gel on chest and it went away and has not came back. )  Chest Pain  This is a new problem. The current episode started in the past 7 days. The problem has been resolved. The pain is present in the substernal region. The patient is experiencing no pain (after using topical voltaren). Pertinent negatives include no cough, dizziness, fever, headaches, hemoptysis, palpitations or sputum production.  Her past medical history is significant for thyroid problem.  Asthma There is no chest tightness, cough, hemoptysis, sputum production or wheezing. This is a recurrent problem. The problem occurs rarely. Pertinent negatives include no chest pain, fever or headaches. Her symptoms are alleviated by beta-agonist and steroid inhaler. She reports significant improvement on treatment. Her past medical history is significant for asthma.  Thyroid Problem Presents for follow-up visit. Patient reports no fatigue or palpitations. The symptoms have been stable.  Knee Pain  Incident onset: severe bone on bone OA of both knees.  Surgery in the near future.    Review of Systems  Constitutional:  Negative for chills, fatigue and fever.  Respiratory:  Negative for cough, hemoptysis, sputum production and wheezing.   Cardiovascular:  Negative for chest pain and palpitations.  Musculoskeletal:  Positive for arthralgias and gait problem.  Neurological:  Negative for dizziness and headaches.     Lab Results  Component Value Date   NA 135 11/23/2022   K 3.7 11/23/2022   CO2 25 11/23/2022   GLUCOSE 111 (H) 11/23/2022   BUN 16 11/23/2022   CREATININE 0.78 11/23/2022   CALCIUM 8.6 (L) 11/23/2022   EGFR 109 07/07/2021   GFRNONAA >60  11/23/2022   Lab Results  Component Value Date   CHOL 181 09/15/2019   HDL 70 09/15/2019   LDLCALC 91 09/15/2019   TRIG 116 09/15/2019   CHOLHDL 2.6 09/15/2019   Lab Results  Component Value Date   TSH 5.23 09/14/2023   Lab Results  Component Value Date   HGBA1C 5.8 (H) 09/15/2019   Lab Results  Component Value Date   WBC 9.6 11/23/2022   HGB 12.9 11/23/2022   HCT 38.0 11/23/2022   MCV 86.6 11/23/2022   PLT 437 (H) 11/23/2022   Lab Results  Component Value Date   ALT 23 09/23/2021   AST 21 09/23/2021   ALKPHOS 94 09/23/2021   BILITOT 0.4 09/23/2021   Lab Results  Component Value Date   VD25OH 29 12/02/2018     Patient Active Problem List   Diagnosis Date Noted   Generalized lymphadenopathy 09/23/2021   B12 deficiency 09/23/2021   Fatigue 09/23/2021   Primary osteoarthritis of both knees 04/29/2021   Ankylosing spondylitis (HCC) 12/30/2018   Asthma in adult without complication 12/30/2018   Chronic diarrhea 12/30/2018   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   Interstitial cystitis 07/03/2016   Pelvic pain in female 07/03/2016   Chondromalacia of knee, 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   BMI 38.0-38.9,adult 02/14/2016   Polyarthralgia 02/14/2016   GERD (gastroesophageal reflux disease) 02/10/2014    Allergies  Allergen Reactions   Lodine [Etodolac] Shortness Of Breath   Quetiapine Other (See Comments)    Excessive sedation at 50 mg.   Zanaflex [Tizanidine Hcl]  Ciprofloxacin Hives   Escitalopram Other (See Comments)    Intolerant.   Latex Rash   Levaquin [Levofloxacin In D5w] Rash    HIVES   Sulfa Antibiotics Other (See Comments)    Can't remember reactions    Past Surgical History:  Procedure Laterality Date   BREAST BIOPSY Right 10/18/2015   bx/clip- neg   CHOLECYSTECTOMY N/A 07/07/2016   Procedure: LAPAROSCOPIC CHOLECYSTECTOMY;  Surgeon: Henrene Dodge, MD;  Location: [facility] ORS;  Service: General;  Laterality: N/A;   COLONOSCOPY WITH PROPOFOL N/A  10/11/2015   Procedure: COLONOSCOPY WITH PROPOFOL;  Surgeon: Christena Deem, MD;  Location: [facility] ENDOSCOPY;  Service: Endoscopy;  Laterality: N/A;   ESOPHAGOGASTRODUODENOSCOPY (EGD) WITH PROPOFOL N/A 10/11/2015   Procedure: ESOPHAGOGASTRODUODENOSCOPY (EGD) WITH PROPOFOL;  Surgeon: Christena Deem, MD;  Location: [facility] ENDOSCOPY;  Service: Endoscopy;  Laterality: N/A;   TONSILLECTOMY     TOTAL THYROIDECTOMY  2005   goiter    Social History   Tobacco Use   Smoking status: Former    Current packs/day: 0.00    Average packs/day: 0.5 packs/day for 7.0 years (3.5 ttl pk-yrs)    Types: Cigarettes    Start date: 61    Quit date: 2003    Years since quitting: 22.1    Passive exposure: Never   Smokeless tobacco: Never  Vaping Use   Vaping status: Never Used  Substance Use Topics   Alcohol use: Yes    Comment: Occ.   Drug use: No     Medication list has been reviewed and updated.  Current Meds  Medication Sig   albuterol (VENTOLIN HFA) 108 (90 Base) MCG/ACT inhaler Inhale 2 puffs into the lungs every 6 (six) hours as needed for wheezing or shortness of breath.   Azelaic Acid 15 % gel Apply 1 application topically to face at bedtime.   celecoxib (CELEBREX) 100 MG capsule Take 1 capsule (100 mg total) by mouth 2 (two) times daily.   fluticasone-salmeterol (ADVAIR DISKUS) 100-50 MCG/ACT AEPB Inhale 1 puff into the lungs 2 (two) times daily as needed.   mometasone (ELOCON) 0.1 % ointment Apply 1 Application topically 2 (two) times daily as needed.   nystatin cream (MYCOSTATIN) Apply 1 Application topically 2 (two) times daily.   pantoprazole (PROTONIX) 40 MG tablet Take 1 tablet (40 mg total) by mouth 2 (two) times daily before a meal. Take 30 minutes before meals.   phenazopyridine (PYRIDIUM) 200 MG tablet Take 1 tablet (200 mg total) by mouth 3 (three) times daily as needed for pain.   SYNTHROID 200 MCG tablet Take 1 tablet (200 mcg total) by mouth once daily. Take on an empty  stomach with a glass of water at least 30-60 minutes before breakfast.       04/04/2022    1:51 PM 10/20/2020   11:17 AM 10/11/2020    8:37 AM  GAD 7 : Generalized Anxiety Score  Nervous, Anxious, on Edge 0 0 0  Control/stop worrying 0 0 0  Worry too much - different things 1 0 0  Trouble relaxing 0 0 0  Restless 0 0 0  Easily annoyed or irritable 1 0 0  Afraid - awful might happen 0 0 0  Total GAD 7 Score 2 0 0  Anxiety Difficulty Not difficult at all Not difficult at all Not difficult at all       04/04/2022    1:51 PM 10/20/2021   10:18 AM 10/20/2020   11:17 AM  Depression screen PHQ 2/9  Decreased Interest 1 0 0  Down, Depressed, Hopeless 1 1 0  PHQ - 2 Score 2 1 0  Altered sleeping 1  0  Tired, decreased energy 1  0  Change in appetite 1  0  Feeling bad or failure about yourself  1  0  Trouble concentrating 0  0  Moving slowly or fidgety/restless 1  0  Suicidal thoughts 0  0  PHQ-9 Score 7  0  Difficult doing work/chores Not difficult at all  Not difficult at all    BP Readings from Last 3 Encounters:  10/23/23 118/74  10/05/23 (!) 144/91  08/13/23 134/82    Physical Exam Vitals and nursing note reviewed.  Constitutional:      General: She is not in acute distress.    Appearance: She is well-developed.  HENT:     Head: Normocephalic and atraumatic.  Cardiovascular:     Rate and Rhythm: Normal rate and regular rhythm.     Heart sounds: Normal heart sounds.  Pulmonary:     Effort: Pulmonary effort is normal. No respiratory distress.     Breath sounds: Normal breath sounds. No decreased breath sounds or wheezing.  Chest:     Chest wall: No mass or tenderness.  Musculoskeletal:     Cervical back: Normal range of motion.  Skin:    General: Skin is warm and dry.     Findings: No rash.  Neurological:     Mental Status: She is alert and oriented to person, place, and time.  Psychiatric:        Mood and Affect: Mood normal.        Behavior: Behavior normal.      Wt Readings from Last 3 Encounters:  10/23/23 237 lb (107.5 kg)  08/13/23 246 lb 6.4 oz (111.8 kg)  08/09/22 246 lb 14.6 oz (112 kg)    BP 118/74   Pulse 76   Ht 5\' 4"  (1.626 m)   Wt 237 lb (107.5 kg)   SpO2 99%   BMI 40.68 kg/m   Assessment and Plan:  Problem List Items Addressed This Visit       Unprioritized   Post-surgical hypothyroidism   Followed by Endo - Dr. Tedd Sias Lab Results  Component Value Date   TSH 5.23 09/14/2023         Asthma in adult without complication   Asthma controlled with Advair and PRN albuterol      Primary osteoarthritis of both knees - Primary   Planning TKA bilateral in the near future Appears optimized for surgery. Will complete forms when received.      Other Visit Diagnoses       Costochondritis       resolved -       No follow-ups on file.    Reubin Milan, MD [facility] [place]

## 2023-10-23 NOTE — Assessment & Plan Note (Signed)
Followed by Endo - Dr. Tedd Sias Lab Results  Component Value Date   TSH 5.23 09/14/2023

## 2023-10-23 NOTE — Assessment & Plan Note (Signed)
Planning TKA bilateral in the near future Appears optimized for surgery. Will complete forms when received.

## 2023-10-23 NOTE — Assessment & Plan Note (Signed)
Asthma controlled with Advair and PRN albuterol

## 2023-11-06 ENCOUNTER — Other Ambulatory Visit: Payer: Self-pay | Admitting: Internal Medicine

## 2023-11-06 ENCOUNTER — Encounter: Payer: Self-pay | Admitting: Internal Medicine

## 2023-11-06 DIAGNOSIS — R0789 Other chest pain: Secondary | ICD-10-CM

## 2023-11-06 NOTE — Telephone Encounter (Signed)
 Please review.  KP

## 2023-11-06 NOTE — Progress Notes (Unsigned)
 Date:  11/06/2023   Name:  Hailey [name]   DOB:  November 24, 1975   MRN:  [id number]   Chief Complaint: No chief complaint on file.  HPI  Review of Systems   Lab Results  Component Value Date   NA 135 11/23/2022   K 3.7 11/23/2022   CO2 25 11/23/2022   GLUCOSE 111 (H) 11/23/2022   BUN 16 11/23/2022   CREATININE 0.78 11/23/2022   CALCIUM 8.6 (L) 11/23/2022   EGFR 109 07/07/2021   GFRNONAA >60 11/23/2022   Lab Results  Component Value Date   CHOL 181 09/15/2019   HDL 70 09/15/2019   LDLCALC 91 09/15/2019   TRIG 116 09/15/2019   CHOLHDL 2.6 09/15/2019   Lab Results  Component Value Date   TSH 5.23 09/14/2023   Lab Results  Component Value Date   HGBA1C 5.8 (H) 09/15/2019   Lab Results  Component Value Date   WBC 9.6 11/23/2022   HGB 12.9 11/23/2022   HCT 38.0 11/23/2022   MCV 86.6 11/23/2022   PLT 437 (H) 11/23/2022   Lab Results  Component Value Date   ALT 23 09/23/2021   AST 21 09/23/2021   ALKPHOS 94 09/23/2021   BILITOT 0.4 09/23/2021   Lab Results  Component Value Date   VD25OH 29 12/02/2018     Patient Active Problem List   Diagnosis Date Noted   Generalized lymphadenopathy 09/23/2021   B12 deficiency 09/23/2021   Fatigue 09/23/2021   Primary osteoarthritis of both knees 04/29/2021   Ankylosing spondylitis (HCC) 12/30/2018   Asthma in adult without complication 12/30/2018   Chronic diarrhea 12/30/2018   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   Interstitial cystitis 07/03/2016   Pelvic pain in female 07/03/2016   Chondromalacia of knee, 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   BMI 38.0-38.9,adult 02/14/2016   Polyarthralgia 02/14/2016   GERD (gastroesophageal reflux disease) 02/10/2014    Allergies  Allergen Reactions   Lodine [Etodolac] Shortness Of Breath   Quetiapine Other (See Comments)    Excessive sedation at 50 mg.   Zanaflex [Tizanidine Hcl]    Ciprofloxacin Hives   Escitalopram Other (See Comments)    Intolerant.   Latex Rash   Levaquin  [Levofloxacin In D5w] Rash    HIVES   Sulfa Antibiotics Other (See Comments)    Can't remember reactions    Past Surgical History:  Procedure Laterality Date   BREAST BIOPSY Right 10/18/2015   bx/clip- neg   CHOLECYSTECTOMY N/A 07/07/2016   Procedure: LAPAROSCOPIC CHOLECYSTECTOMY;  Surgeon: Henrene Dodge, MD;  Location: [facility] ORS;  Service: General;  Laterality: N/A;   COLONOSCOPY WITH PROPOFOL N/A 10/11/2015   Procedure: COLONOSCOPY WITH PROPOFOL;  Surgeon: Christena Deem, MD;  Location: [facility] ENDOSCOPY;  Service: Endoscopy;  Laterality: N/A;   ESOPHAGOGASTRODUODENOSCOPY (EGD) WITH PROPOFOL N/A 10/11/2015   Procedure: ESOPHAGOGASTRODUODENOSCOPY (EGD) WITH PROPOFOL;  Surgeon: Christena Deem, MD;  Location: [facility] ENDOSCOPY;  Service: Endoscopy;  Laterality: N/A;   TONSILLECTOMY     TOTAL THYROIDECTOMY  2005   goiter    Social History   Tobacco Use   Smoking status: Former    Current packs/day: 0.00    Average packs/day: 0.5 packs/day for 7.0 years (3.5 ttl pk-yrs)    Types: Cigarettes    Start date: 51    Quit date: 2003    Years since quitting: 22.1    Passive exposure: Never   Smokeless tobacco: Never  Vaping Use   Vaping status: Never Used  Substance Use Topics  Alcohol use: Yes    Comment: Occ.   Drug use: No     Medication list has been reviewed and updated.  No outpatient medications have been marked as taking for the 11/06/23 encounter (Orders Only) with Reubin Milan, MD.       04/04/2022    1:51 PM 10/20/2020   11:17 AM 10/11/2020    8:37 AM  GAD 7 : Generalized Anxiety Score  Nervous, Anxious, on Edge 0 0 0  Control/stop worrying 0 0 0  Worry too much - different things 1 0 0  Trouble relaxing 0 0 0  Restless 0 0 0  Easily annoyed or irritable 1 0 0  Afraid - awful might happen 0 0 0  Total GAD 7 Score 2 0 0  Anxiety Difficulty Not difficult at all Not difficult at all Not difficult at all       04/04/2022    1:51 PM 10/20/2021   10:18 AM  10/20/2020   11:17 AM  Depression screen PHQ 2/9  Decreased Interest 1 0 0  Down, Depressed, Hopeless 1 1 0  PHQ - 2 Score 2 1 0  Altered sleeping 1  0  Tired, decreased energy 1  0  Change in appetite 1  0  Feeling bad or failure about yourself  1  0  Trouble concentrating 0  0  Moving slowly or fidgety/restless 1  0  Suicidal thoughts 0  0  PHQ-9 Score 7  0  Difficult doing work/chores Not difficult at all  Not difficult at all    BP Readings from Last 3 Encounters:  10/23/23 118/74  10/05/23 (!) 144/91  08/13/23 134/82    Physical Exam  Wt Readings from Last 3 Encounters:  10/23/23 237 lb (107.5 kg)  08/13/23 246 lb 6.4 oz (111.8 kg)  08/09/22 246 lb 14.6 oz (112 kg)    There were no vitals taken for this visit.  Assessment and Plan:  Problem List Items Addressed This Visit   None   No follow-ups on file.    Reubin Milan, MD [facility] [place]

## 2023-11-20 ENCOUNTER — Encounter: Payer: 59 | Admitting: Internal Medicine

## 2023-11-23 ENCOUNTER — Telehealth: Payer: Self-pay | Admitting: *Deleted

## 2023-11-23 ENCOUNTER — Ambulatory Visit: Payer: 59 | Attending: Cardiology | Admitting: Cardiology

## 2023-11-23 ENCOUNTER — Other Ambulatory Visit: Payer: Self-pay

## 2023-11-23 ENCOUNTER — Encounter: Payer: Self-pay | Admitting: Cardiology

## 2023-11-23 VITALS — BP 116/78 | HR 80 | Ht 64.0 in | Wt 236.6 lb

## 2023-11-23 DIAGNOSIS — R072 Precordial pain: Secondary | ICD-10-CM | POA: Diagnosis not present

## 2023-11-23 DIAGNOSIS — R0789 Other chest pain: Secondary | ICD-10-CM | POA: Diagnosis not present

## 2023-11-23 DIAGNOSIS — E785 Hyperlipidemia, unspecified: Secondary | ICD-10-CM

## 2023-11-23 DIAGNOSIS — M459 Ankylosing spondylitis of unspecified sites in spine: Secondary | ICD-10-CM | POA: Diagnosis not present

## 2023-11-23 DIAGNOSIS — R0609 Other forms of dyspnea: Secondary | ICD-10-CM

## 2023-11-23 DIAGNOSIS — R079 Chest pain, unspecified: Secondary | ICD-10-CM

## 2023-11-23 DIAGNOSIS — Z01810 Encounter for preprocedural cardiovascular examination: Secondary | ICD-10-CM

## 2023-11-23 MED ORDER — METOPROLOL TARTRATE 100 MG PO TABS
100.0000 mg | ORAL_TABLET | ORAL | 0 refills | Status: DC
  Filled 2023-11-23: qty 1, 1d supply, fill #0

## 2023-11-23 NOTE — Patient Instructions (Addendum)
 Medication Instructions:   TAKE: Metoptrolol 100mg  1 tablet 2 hours prior to CT Scan   Lab Work: BMP, Lipid panel If you have labs (blood work) drawn today and your tests are completely normal, you will receive your results only by: MyChart Message (if you have MyChart) OR A paper copy in the mail If you have any lab test that is abnormal or we need to change your treatment, we will call you to review the results.   Testing/Procedures: Your physician has requested that you have an echocardiogram. Echocardiography is a painless test that uses sound waves to create images of your heart. It provides your doctor with information about the size and shape of your heart and how well your heart's chambers and valves are working. This procedure takes approximately one hour. There are no restrictions for this procedure. Please do NOT wear cologne, perfume, aftershave, or lotions (deodorant is allowed). Please arrive 15 minutes prior to your appointment time.  Please note: We ask at that you not bring children with you during ultrasound (echo/ vascular) testing. Due to room size and safety concerns, children are not allowed in the ultrasound rooms during exams. Our front office staff cannot provide observation of children in our lobby area while testing is being conducted. An adult accompanying a patient to their appointment will only be allowed in the ultrasound room at the discretion of the ultrasound technician under special circumstances. We apologize for any inconvenience.    Your cardiac CT will be scheduled at one of the below locations:    [facility] [address] [place], [place] [address] [phone]   If scheduled at [facility], please arrive 15 mins early for check-in and test prep.  Please follow these instructions carefully (unless otherwise directed):    On the Night Before the Test: Be sure to Drink  plenty of water. Do not consume any caffeinated/decaffeinated beverages or chocolate 12 hours prior to your test. Do not take any antihistamines 12 hours prior to your test.   On the Day of the Test: Drink plenty of water until 1 hour prior to the test. Do not eat any food 4 hours prior to the test. You may take your regular medications prior to the test.  Take metoprolol (Lopressor) two hours prior to test. FEMALES- please wear underwire-free bra if available, avoid dresses & tight clothing       After the Test: Drink plenty of water. After receiving IV contrast, you may experience a mild flushed feeling. This is normal. On occasion, you may experience a mild rash up to 24 hours after the test. This is not dangerous. If this occurs, you can take Benadryl 25 mg and increase your fluid intake. If you experience trouble breathing, this can be serious. If it is severe call 911 IMMEDIATELY. If it is mild, please call our office. If you take any of these medications: Glipizide/Metformin, Avandament, Glucavance, please do not take 48 hours after completing test unless otherwise instructed.  We will call to schedule your test 2-4 weeks out understanding that some insurance companies will need an authorization prior to the service being performed.   For non-scheduling related questions, please contact the cardiac imaging nurse navigator should you have any questions/concerns: Rockwell Alexandria, Cardiac Imaging Nurse Navigator Larey Brick, Cardiac Imaging Nurse Navigator [facility] Direct Office Dial: [phone]   For scheduling needs, including cancellations and rescheduling, please call [place], [phone].  Follow-Up: At [facility], you and your health needs are our priority.  As part of our continuing mission to provide you with exceptional heart care, we have created designated Provider Care Teams.  These Care Teams include your primary Cardiologist  (physician) and Advanced Practice Providers (APPs -  Physician Assistants and Nurse Practitioners) who all work together to provide you with the care you need, when you need it.  We recommend signing up for the patient portal called "MyChart".  Sign up information is provided on this After Visit Summary.  MyChart is used to connect with patients for Virtual Visits (Telemedicine).  Patients are able to view lab/test results, encounter notes, upcoming appointments, etc.  Non-urgent messages can be sent to your provider as well.   To learn more about what you can do with MyChart, go to [email or web address].    Your next appointment:   3 month(s)  The format for your next appointment:   In Person  Provider:   Gypsy Balsam, MD    Other Instructions NA

## 2023-11-23 NOTE — Progress Notes (Signed)
 Cardiology Consultation:    Date:  11/23/2023   ID:  Hailey [name], DOB 10/15/1975, MRN [id number]  PCP:  Reubin Milan, MD  Cardiologist:  Gypsy Balsam, MD   Referring MD: Reubin Milan, MD   Chief Complaint  Patient presents with   chest discomfort    History of Present Illness:    Hailey [name] is a 48 y.o. female who is being seen today for the evaluation of chest pain at the request of Reubin Milan, MD. past medical history significant for ankylosing spondylitis, dyslipidemia, she was referred to [place] because she need knee replacement surgery.  She has been complaining of having chest pain for about 5 years lately lately became worse and lasting longer.  Pain in the chest have very atypical characteristic it worse by pressing chest wall or so worse with certain motion.  She put Voltaren cream which seems to be helping with this.  However she also described to have some uneasy sensation when she walks or climbs stairs.  Walking or climbing stairs is difficult because of chronic knee pain but otherwise seems to be doing fine.  She does not have hypertension, does not have diabetes, I do have cholesterol from 2021 with LDL of 91 HDL 70, she quit smoking 2003, does not have family history of premature coronary artery disease she is not on any special diet.  She does not exercise on the regular basis.  Past Medical History:  Diagnosis Date   Allergic state    Allergy    Ankylosing spondylitis (HCC)    followed by rheumatology Dr. Allena Katz   Arthritis    Asthma    Cervical disc disease    Depression    Enlarged lymph nodes    GERD (gastroesophageal reflux disease)    H/O colonoscopy    HPV (human papilloma virus) infection    Hypothyroidism    Interstitial cystitis    LGSIL (low grade squamous intraepithelial dysplasia)    Paresthesia    Status post laparoscopic cholecystectomy 07/13/2016    Past Surgical History:  Procedure Laterality Date   BREAST BIOPSY Right  10/18/2015   bx/clip- neg   CHOLECYSTECTOMY N/A 07/07/2016   Procedure: LAPAROSCOPIC CHOLECYSTECTOMY;  Surgeon: Henrene Dodge, MD;  Location: [facility] ORS;  Service: General;  Laterality: N/A;   COLONOSCOPY WITH PROPOFOL N/A 10/11/2015   Procedure: COLONOSCOPY WITH PROPOFOL;  Surgeon: Christena Deem, MD;  Location: [facility] ENDOSCOPY;  Service: Endoscopy;  Laterality: N/A;   ESOPHAGOGASTRODUODENOSCOPY (EGD) WITH PROPOFOL N/A 10/11/2015   Procedure: ESOPHAGOGASTRODUODENOSCOPY (EGD) WITH PROPOFOL;  Surgeon: Christena Deem, MD;  Location: [facility] ENDOSCOPY;  Service: Endoscopy;  Laterality: N/A;   TONSILLECTOMY     TOTAL THYROIDECTOMY  2005   goiter    Current Medications: Current Meds  Medication Sig   albuterol (VENTOLIN HFA) 108 (90 Base) MCG/ACT inhaler Inhale 2 puffs into the lungs every 6 (six) hours as needed for wheezing or shortness of breath.   celecoxib (CELEBREX) 100 MG capsule Take 1 capsule (100 mg total) by mouth 2 (two) times daily. (Patient taking differently: Take 100 mg by mouth 2 (two) times daily. pain)   fluticasone-salmeterol (ADVAIR DISKUS) 100-50 MCG/ACT AEPB Inhale 1 puff into the lungs 2 (two) times daily as needed. (Patient taking differently: Inhale 1 puff into the lungs daily as needed (SOB).)   mometasone (ELOCON) 0.1 % ointment Apply 1 Application topically 2 (two) times daily as needed. (Patient taking differently: Apply 1 Application topically 2 (two)  times daily as needed (rash).)   nystatin cream (MYCOSTATIN) Apply 1 Application topically 2 (two) times daily.   pantoprazole (PROTONIX) 40 MG tablet Take 1 tablet (40 mg total) by mouth 2 (two) times daily before a meal. Take 30 minutes before meals.   phenazopyridine (PYRIDIUM) 200 MG tablet Take 1 tablet (200 mg total) by mouth 3 (three) times daily as needed for pain.   SYNTHROID 200 MCG tablet Take 1 tablet (200 mcg total) by mouth once daily. Take on an empty stomach with a glass of water at least 30-60 minutes  before breakfast.   [DISCONTINUED] Azelaic Acid 15 % gel Apply 1 application topically to face at bedtime.     Allergies:   Lodine [etodolac], Quetiapine, Zanaflex [tizanidine hcl], Ciprofloxacin, Escitalopram, Latex, Levaquin [levofloxacin in d5w], and Sulfa antibiotics   Social History   Socioeconomic History   Marital status: Married    Spouse name: Not on file   Number of children: 1   Years of education: Not on file   Highest education level: Not on file  Occupational History   Not on file  Tobacco Use   Smoking status: Former    Current packs/day: 0.00    Average packs/day: 0.5 packs/day for 7.0 years (3.5 ttl pk-yrs)    Types: Cigarettes    Start date: 59    Quit date: 2003    Years since quitting: 22.2    Passive exposure: Never   Smokeless tobacco: Never  Vaping Use   Vaping status: Never Used  Substance and Sexual Activity   Alcohol use: Yes    Comment: Occ.   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    Social Drivers of Corporate investment banker Strain: Not on file  Food Insecurity: Not on file  Transportation Needs: Not on file  Physical Activity: Not on file  Stress: Not on file  Social Connections: Not on file     Family History: The patient's family history includes Bladder Cancer in her maternal aunt; Breast cancer (age of onset: 43) in her cousin; Cancer in her mother; Diabetes in her father and maternal grandmother; Goiter in her mother; Hypertension in her brother, father, maternal grandmother, and mother; Stroke in her maternal grandmother and mother. ROS:   Please see the history of present illness.    All 14 point review of systems negative except as described per history of present illness.  EKGs/Labs/Other Studies Reviewed:    The following studies  were reviewed today:   EKG:  EKG Interpretation Date/Time:  Friday November 23 2023 11:08:35 EDT Ventricular Rate:  80 PR Interval:  156 QRS Duration:  86 QT Interval:  374 QTC Calculation: 431 R Axis:   75  Text Interpretation: Normal sinus rhythm Cannot rule out Anterior infarct , age undetermined Abnormal ECG When compared with ECG of 23-Nov-2022 13:50, Questionable change in QRS axis Non-specific change in ST segment in Inferior leads Confirmed by Gypsy Balsam [phone]) on 11/23/2023 11:35:37 AM    Recent Labs: 11/23/2022: BUN 16; Creatinine, Ser 0.78; Hemoglobin 12.9; Platelets 437; Potassium 3.7; Sodium 135 09/14/2023: TSH 5.23  Recent Lipid Panel    Component Value Date/Time   CHOL 181 09/15/2019 1003   TRIG 116 09/15/2019 1003   HDL 70 09/15/2019 1003  CHOLHDL 2.6 09/15/2019 1003   LDLCALC 91 09/15/2019 1003    Physical Exam:    VS:  BP 116/78 (BP Location: Right Arm, Patient Position: Sitting)   Pulse 80   Ht 5\' 4"  (1.626 m)   Wt 236 lb 9.6 oz (107.3 kg)   SpO2 97%   BMI 40.61 kg/m     Wt Readings from Last 3 Encounters:  11/23/23 236 lb 9.6 oz (107.3 kg)  10/23/23 237 lb (107.5 kg)  08/13/23 246 lb 6.4 oz (111.8 kg)     GEN:  Well nourished, well developed in no acute distress HEENT: Normal NECK: No JVD; No carotid bruits LYMPHATICS: No lymphadenopathy CARDIAC: RRR, no murmurs, no rubs, no gallops RESPIRATORY:  Clear to auscultation without rales, wheezing or rhonchi  ABDOMEN: Soft, non-tender, non-distended MUSCULOSKELETAL:  No edema; No deformity  SKIN: Warm and dry NEUROLOGIC:  Alert and oriented x 3 PSYCHIATRIC:  Normal affect   ASSESSMENT:    1. Chest pain, unspecified type   2. Atypical chest pain   3. Preop cardiovascular exam   4. Ankylosing spondylitis, unspecified site of spine (HCC)   5. Dyslipidemia    PLAN:    In order of problems listed above:  Chest pain very atypical but she does have some risk factors for coronary artery  disease on top of that EKG showed possibility of anterior wall myocardial infarction.  We decided to proceed with coronary CT angio to rule out significant coronary artery disease.  Procedure explained to her including all risk benefits we will proceed. Abnormal EKG showing possibility of anterior septal wall myocardial infarction will schedule him to have echocardiogram to make sure she does have any segmental wall motion abnormalities. Dyslipidemia I do have last dated from 2021, echocardiogram will be done. Ankylosing spondylitis.  That being followed by antimedicine team. Cardiovascular preop evaluation, if echocardiogram and coronary CT angio is negative will be reasonable to proceed with surgery at low risk.   Medication Adjustments/Labs and Tests Ordered: Current medicines are reviewed at length with the patient today.  Concerns regarding medicines are outlined above.  Orders Placed This Encounter  Procedures   EKG 12-Lead   No orders of the defined types were placed in this encounter.   Signed, Georgeanna Lea, MD, [facility]. 11/23/2023 11:36 AM    [facility]

## 2023-11-23 NOTE — Telephone Encounter (Signed)
   Pre-operative Risk Assessment    Patient Name: Hailey [name]  DOB: 02/08/1976 MRN: [id number]   Date of last office visit: 11/23/23 DR. KRASOWSKI Date of next office visit: 02/25/24 DR. KRASOWSKI   Request for Surgical Clearance    Procedure:   LEFT TOTAL KNEE ARTHROPLASTY  Date of Surgery:  Clearance 01/08/24                                Surgeon:  DR. MATTHEW OLIN Surgeon's Group or Practice Name:  [facility] Phone number:  [phone] Fax number:  [phone] SHERRY WILLS   Type of Clearance Requested:   - Medical ; NONE INDICATED TO BE HELD    Type of Anesthesia:  Spinal   Additional requests/questions:    Elpidio Anis   11/23/2023, 1:31 PM

## 2023-11-26 ENCOUNTER — Ambulatory Visit: Payer: 59 | Admitting: General Practice

## 2023-11-26 ENCOUNTER — Encounter (HOSPITAL_COMMUNITY): Payer: Self-pay

## 2023-11-27 ENCOUNTER — Encounter: Payer: Self-pay | Admitting: Emergency Medicine

## 2023-11-27 ENCOUNTER — Emergency Department
Admission: EM | Admit: 2023-11-27 | Discharge: 2023-11-27 | Disposition: A | Discharge status: Home / Self Care | Attending: Emergency Medicine | Admitting: Emergency Medicine

## 2023-11-27 ENCOUNTER — Other Ambulatory Visit: Payer: Self-pay

## 2023-11-27 ENCOUNTER — Emergency Department

## 2023-11-27 DIAGNOSIS — J45909 Unspecified asthma, uncomplicated: Secondary | ICD-10-CM | POA: Insufficient documentation

## 2023-11-27 DIAGNOSIS — Z96659 Presence of unspecified artificial knee joint: Secondary | ICD-10-CM | POA: Insufficient documentation

## 2023-11-27 DIAGNOSIS — R0789 Other chest pain: Secondary | ICD-10-CM | POA: Diagnosis not present

## 2023-11-27 DIAGNOSIS — R079 Chest pain, unspecified: Secondary | ICD-10-CM

## 2023-11-27 DIAGNOSIS — R0602 Shortness of breath: Secondary | ICD-10-CM | POA: Diagnosis not present

## 2023-11-27 LAB — CBC
HCT: 39.1 % (ref 36.0–46.0)
Hemoglobin: 13.4 g/dL (ref 12.0–15.0)
MCH: 29.6 pg (ref 26.0–34.0)
MCHC: 34.3 g/dL (ref 30.0–36.0)
MCV: 86.3 fL (ref 80.0–100.0)
Platelets: 435 10*3/uL — ABNORMAL HIGH (ref 150–400)
RBC: 4.53 MIL/uL (ref 3.87–5.11)
RDW: 13.2 % (ref 11.5–15.5)
WBC: 10.3 10*3/uL (ref 4.0–10.5)
nRBC: 0 % (ref 0.0–0.2)

## 2023-11-27 LAB — BASIC METABOLIC PANEL
Anion gap: 13 (ref 5–15)
BUN: 16 mg/dL (ref 6–20)
CO2: 23 mmol/L (ref 22–32)
Calcium: 9.7 mg/dL (ref 8.9–10.3)
Chloride: 99 mmol/L (ref 98–111)
Creatinine, Ser: 0.77 mg/dL (ref 0.44–1.00)
GFR, Estimated: >60 mL/min (ref >60)
Glucose, Bld: 106 mg/dL — ABNORMAL HIGH (ref 70–99)
Potassium: 3.8 mmol/L (ref 3.5–5.1)
Sodium: 135 mmol/L (ref 135–145)

## 2023-11-27 LAB — TROPONIN I (HIGH SENSITIVITY): Troponin I (High Sensitivity): 2 ng/L (ref <18)

## 2023-11-27 NOTE — ED Provider Notes (Signed)
 [facility] Provider Note    Event Date/Time   First MD Initiated Contact with Patient 11/27/23 1545     (approximate)  History   Chief Complaint: Chest Pain  HPI  Hailey [name] is a 48 y.o. female with a past medical history of asthma, arthritis, costochondritis, presents to the emergency department for chest pain.  According to the patient she is currently working with an orthopedist to have a knee replacement.  She saw cardiologist for cardiology clearance for the surgery however they noted an abnormal EKG and have scheduled her for a coronary CT this Thursday.  Patient states she has been experiencing intermittent chest pain for many months, but states she believes it is due to her chronic costochondritis discomfort.  Patient states the chest pain is reproducible with stretching or palpation.  However as the patient developed chest pain today and was told she may have had a heart attack recently on her EKG she was worried and came to the emergency department for evaluation.  No shortness of breath nausea or diaphoresis.    Physical Exam   Triage Vital Signs: ED Triage Vitals  Encounter Vitals Group     BP 11/27/23 1540 132/83     Systolic BP Percentile --      Diastolic BP Percentile --      Pulse Rate 11/27/23 1540 78     Resp 11/27/23 1540 18     Temp 11/27/23 1540 98 F (36.7 C)     Temp Source 11/27/23 1540 Oral     SpO2 11/27/23 1540 98 %     Weight 11/27/23 1539 235 lb (106.6 kg)     Height 11/27/23 1539 5\' 4"  (1.626 m)     Head Circumference --      Peak Flow --      Pain Score 11/27/23 1538 7     Pain Loc --      Pain Education --      Exclude from Growth Chart --     Most recent vital signs: Vitals:   11/27/23 1540  BP: 132/83  Pulse: 78  Resp: 18  Temp: 98 F (36.7 C)  SpO2: 98%    General: Awake, no distress.  CV:  Good peripheral perfusion.  Regular rate and rhythm  Resp:  Normal effort.  Equal breath sounds bilaterally.   Moderate central chest tenderness to palpation overlying the sternum. Abd:  No distention.  Soft, nontender.  No rebound or guarding.  ED Results / Procedures / Treatments   EKG  EKG viewed and interpreted by myself shows a normal sinus rhythm at 88 bpm with a narrow QRS, left axis deviation, largely normal intervals.  No concerning ST changes.  RADIOLOGY  I have reviewed and interpreted the chest x-ray images.  No consolidation seen on my evaluation.    MEDICATIONS ORDERED IN ED: Medications - No data to display   IMPRESSION / MDM / ASSESSMENT AND PLAN / ED COURSE  I reviewed the triage vital signs and the nursing notes.  Patient's presentation is most consistent with acute presentation with potential threat to life or bodily function.  Patient presents to the emergency department for chest pain.  States that has been ongoing times several months but has been diagnosed previously with costochondritis.  Pain is reproducible to palpation.  However she recently saw a cardiologist for clearance for an orthopedic surgery and was told that she may have had a small heart attack at some point  in the past.  Her EKG today does show small amount of Q waves anteriorly which is likely what they are referring to.  We will check labs including cardiac enzymes x 1.  Patient states the chest pain began this morning around 8 AM and has been intermittent.  But again states it has been ongoing like this times several months.  Reassuring physical exam, reassuring vitals.  Chest x-ray and EKG reassuring as well.  Patient's workup is reassuring.  Lab work is normal including normal CBC, normal chemistry and a negative troponin.  Chest x-ray is clear and EKG shows no concerning findings.  Given the patient's reassuring workup I believe the patient safer discharge home with outpatient follow-up as well as her planned cardiac CT on Thursday.  FINAL CLINICAL IMPRESSION(S) / ED DIAGNOSES   Chest pain Chest wall  pain    Note:  This document was prepared using Dragon voice recognition software and may include unintentional dictation errors.   Minna Antis, MD 11/27/23 [phone]

## 2023-11-27 NOTE — ED Triage Notes (Signed)
 Pt was seen last Friday by cardiology to get cleared for knee surgery. While there pt had abnormal EKG and was told may have had a mild heart attack and scheduled a CTA this Thursday. Chest pain on and off x 2 months. Today chest pain and left arm pain worsen with SOB.

## 2023-11-28 ENCOUNTER — Other Ambulatory Visit
Admission: RE | Admit: 2023-11-28 | Discharge: 2023-11-28 | Disposition: A | Discharge status: Home / Self Care | Attending: Cardiology | Admitting: Cardiology

## 2023-11-28 DIAGNOSIS — E785 Hyperlipidemia, unspecified: Secondary | ICD-10-CM | POA: Insufficient documentation

## 2023-11-28 LAB — LIPID PANEL
Cholesterol: 162 mg/dL (ref 0–200)
HDL: 61 mg/dL (ref >40)
LDL Cholesterol: 81 mg/dL (ref 0–99)
Total CHOL/HDL Ratio: 2.7 ratio
Triglycerides: 98 mg/dL (ref <150)
VLDL: 20 mg/dL (ref 0–40)

## 2023-11-29 ENCOUNTER — Ambulatory Visit
Admission: RE | Admit: 2023-11-29 | Discharge: 2023-11-29 | Disposition: A | Discharge status: Home / Self Care | Source: Ambulatory Visit | Attending: Cardiology | Admitting: Cardiology

## 2023-11-29 ENCOUNTER — Telehealth: Payer: Self-pay

## 2023-11-29 DIAGNOSIS — R072 Precordial pain: Secondary | ICD-10-CM | POA: Insufficient documentation

## 2023-11-29 MED ORDER — NITROGLYCERIN 0.4 MG SL SUBL
0.8000 mg | SUBLINGUAL_TABLET | Freq: Once | SUBLINGUAL | Status: AC
  Administered 2023-11-29: 0.8 mg via SUBLINGUAL

## 2023-11-29 MED ORDER — SODIUM CHLORIDE 0.9 % IV SOLN: INTRAVENOUS | Status: DC

## 2023-11-29 MED ORDER — IOHEXOL 350 MG/ML SOLN
100.0000 mL | Freq: Once | INTRAVENOUS | Status: AC | PRN
  Administered 2023-11-29: 100 mL via INTRAVENOUS

## 2023-11-29 NOTE — Progress Notes (Signed)
 Patient tolerated CT well. Vital signs stable encourage to drink water throughout day.Reasons explained and verbalized understanding. Ambulated steady gait.

## 2023-11-29 NOTE — Telephone Encounter (Signed)
   Pre-operative Risk Assessment    Patient Name: Hailey [name]  DOB: 08/20/1976 MRN: [id number]   Date of last office visit: 11/23/23 with Dr. Bing Matter Date of next office visit: None   Request for Surgical Clearance    Procedure:   Left total knee arthoplasty  Date of Surgery:  Clearance 01/08/24                                 Surgeon:  Dr. Durene Romans  Surgeon's Group or Practice Name:  [facility] Phone number:  [phone] Attn: Rosalva Ferron Fax number:  [phone]   Type of Clearance Requested:   - Medical    Type of Anesthesia:  Spinal   Additional requests/questions:    Robley Fries   11/29/2023, 4:24 PM

## 2023-11-30 ENCOUNTER — Telehealth: Payer: Self-pay

## 2023-11-30 NOTE — Telephone Encounter (Signed)
-----   Message from Gypsy Balsam sent at 11/30/2023 12:09 PM EDT ----- Coronary CT angio was normal

## 2023-11-30 NOTE — Telephone Encounter (Signed)
 Patient notified through my chart.

## 2023-11-30 NOTE — Telephone Encounter (Signed)
-----   Message from Gypsy Balsam sent at 11/30/2023 12:23 PM EDT ----- Cholesterol acceptable, diet and exercise

## 2023-12-06 NOTE — Telephone Encounter (Signed)
   Patient Name: Hailey [name]  DOB: November 24, 1975 MRN: [id number]  Primary Cardiologist: None  Chart reviewed as part of pre-operative protocol coverage. Pre-op clearance already addressed by colleagues in earlier phone notes. To summarize recommendations:  -"Coronary calcium score normal, coronary CT angio normal, extracardiac structures normal." -Dr. Bing Matter  The patient may go forward with left total knee arthroplasty without any further cardiovascular testing.  No medications indicated as needing held.  Will route this bundled recommendation to requesting provider via Epic fax function and remove from pre-op pool. Please call with questions.  Sharlene Dory, PA-C 12/06/2023, 3:01 PM

## 2023-12-07 ENCOUNTER — Telehealth: Payer: Self-pay

## 2023-12-07 NOTE — Telephone Encounter (Signed)
 Pt viewed CT results on My Chart per Dr. Vanetta Shawl note. Routed to PCP.

## 2023-12-14 ENCOUNTER — Telehealth: Payer: Self-pay | Admitting: *Deleted

## 2023-12-14 NOTE — Telephone Encounter (Signed)
   Name: Hailey [name]  DOB: 27-Feb-1976  MRN: [id number]   Primary Cardiologist: None  Chart reviewed as part of pre-operative protocol coverage. Hailey [name] was last seen on 11/23/2023 by Dr. Bing Matter.  She underwent coronary CTA on 11/29/2023 which demonstrated a calcium score of 0 with no evidence of CAD.  Therefore, based on ACC/AHA guidelines, the patient would be an acceptable risk for the planned procedure without further cardiovascular testing.   I will route this recommendation to the requesting party via Epic fax function and remove from pre-op pool. Please call with questions.  Carlos Levering, NP 12/14/2023, 12:28 PM

## 2023-12-14 NOTE — Telephone Encounter (Signed)
   Pre-operative Risk Assessment    Patient Name: Hailey [name]  DOB: [birth year] MRN: [id number]      Request for Surgical Clearance    Procedure:   Right Total Knee Arthroplasty  Date of Surgery:  Clearance 02/19/24                                 Surgeon:  Durene Romans, MD Surgeon's Group or Practice Name:  [facility] Phone number:  [phone] Fax number:  [phone]   Type of Clearance Requested:  Medical     Type of Anesthesia:  Spinal   Additional requests/questions:  Please fax a copy of Last office note, pertinent records to the surgeon's office.  Signed, Stacie Glaze   12/14/2023, 12:04 PM

## 2023-12-17 ENCOUNTER — Other Ambulatory Visit: Payer: Self-pay | Admitting: Internal Medicine

## 2023-12-17 NOTE — Progress Notes (Unsigned)
 Date:  12/17/2023   Name:  Hailey [name]   DOB:  [birth year]   MRN:  [id number]   Chief Complaint: No chief complaint on file.  HPI  Review of Systems   Lab Results  Component Value Date   NA 135 11/27/2023   K 3.8 11/27/2023   CO2 23 11/27/2023   GLUCOSE 106 (H) 11/27/2023   BUN 16 11/27/2023   CREATININE 0.77 11/27/2023   CALCIUM 9.7 11/27/2023   EGFR 109 07/07/2021   GFRNONAA >60 11/27/2023   Lab Results  Component Value Date   CHOL 162 11/28/2023   HDL 61 11/28/2023   LDLCALC 81 11/28/2023   TRIG 98 11/28/2023   CHOLHDL 2.7 11/28/2023   Lab Results  Component Value Date   TSH 5.23 09/14/2023   Lab Results  Component Value Date   HGBA1C 5.8 (H) 09/15/2019   Lab Results  Component Value Date   WBC 10.3 11/27/2023   HGB 13.4 11/27/2023   HCT 39.1 11/27/2023   MCV 86.3 11/27/2023   PLT 435 (H) 11/27/2023   Lab Results  Component Value Date   ALT 23 09/23/2021   AST 21 09/23/2021   ALKPHOS 94 09/23/2021   BILITOT 0.4 09/23/2021   Lab Results  Component Value Date   VD25OH 29 12/02/2018     Patient Active Problem List   Diagnosis Date Noted   Atypical chest pain 11/23/2023   Preop cardiovascular exam 11/23/2023   Dyslipidemia 11/23/2023   Generalized lymphadenopathy 09/23/2021   B12 deficiency 09/23/2021   Fatigue 09/23/2021   Primary osteoarthritis of both knees 04/29/2021   Ankylosing spondylitis (HCC) 12/30/2018   Asthma in adult without complication 12/30/2018   Chronic diarrhea 12/30/2018   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   Interstitial cystitis 07/03/2016   Pelvic pain in female 07/03/2016   Chondromalacia of knee, 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   BMI 38.0-38.9,adult 02/14/2016   Polyarthralgia 02/14/2016   GERD (gastroesophageal reflux disease) 02/10/2014    Allergies  Allergen Reactions   Lodine [Etodolac] Shortness Of Breath   Quetiapine Other (See Comments)    Excessive sedation at 50 mg.   Zanaflex [Tizanidine Hcl]     Ciprofloxacin Hives   Escitalopram Other (See Comments)    Intolerant.   Latex Rash   Levaquin [Levofloxacin In D5w] Rash    HIVES   Sulfa Antibiotics Other (See Comments)    Can't remember reactions    Past Surgical History:  Procedure Laterality Date   BREAST BIOPSY Right 10/18/2015   bx/clip- neg   CHOLECYSTECTOMY N/A 07/07/2016   Procedure: LAPAROSCOPIC CHOLECYSTECTOMY;  Surgeon: Henrene Dodge, MD;  Location: [facility] ORS;  Service: General;  Laterality: N/A;   COLONOSCOPY WITH PROPOFOL N/A 10/11/2015   Procedure: COLONOSCOPY WITH PROPOFOL;  Surgeon: Christena Deem, MD;  Location: [facility] ENDOSCOPY;  Service: Endoscopy;  Laterality: N/A;   ESOPHAGOGASTRODUODENOSCOPY (EGD) WITH PROPOFOL N/A 10/11/2015   Procedure: ESOPHAGOGASTRODUODENOSCOPY (EGD) WITH PROPOFOL;  Surgeon: Christena Deem, MD;  Location: [facility] ENDOSCOPY;  Service: Endoscopy;  Laterality: N/A;   TONSILLECTOMY     TOTAL THYROIDECTOMY  2005   goiter    Social History   Tobacco Use   Smoking status: Former    Current packs/day: 0.00    Average packs/day: 0.5 packs/day for 7.0 years (3.5 ttl pk-yrs)    Types: Cigarettes    Start date: 4    Quit date: 2003    Years since quitting: 22.2    Passive exposure: Never   Smokeless tobacco: Never  Vaping Use   Vaping status: Never Used  Substance Use Topics   Alcohol use: Yes    Comment: Occ.   Drug use: No     Medication list has been reviewed and updated.  No outpatient medications have been marked as taking for the 12/17/23 encounter (Orders Only) with Reubin Milan, MD.       04/04/2022    1:51 PM 10/20/2020   11:17 AM 10/11/2020    8:37 AM  GAD 7 : Generalized Anxiety Score  Nervous, Anxious, on Edge 0 0 0  Control/stop worrying 0 0 0  Worry too much - different things 1 0 0  Trouble relaxing 0 0 0  Restless 0 0 0  Easily annoyed or irritable 1 0 0  Afraid - awful might happen 0 0 0  Total GAD 7 Score 2 0 0  Anxiety Difficulty Not difficult at all  Not difficult at all Not difficult at all       04/04/2022    1:51 PM 10/20/2021   10:18 AM 10/20/2020   11:17 AM  Depression screen PHQ 2/9  Decreased Interest 1 0 0  Down, Depressed, Hopeless 1 1 0  PHQ - 2 Score 2 1 0  Altered sleeping 1  0  Tired, decreased energy 1  0  Change in appetite 1  0  Feeling bad or failure about yourself  1  0  Trouble concentrating 0  0  Moving slowly or fidgety/restless 1  0  Suicidal thoughts 0  0  PHQ-9 Score 7  0  Difficult doing work/chores Not difficult at all  Not difficult at all    BP Readings from Last 3 Encounters:  11/29/23 128/79  11/27/23 115/82  11/23/23 116/78    Physical Exam  Wt Readings from Last 3 Encounters:  11/27/23 235 lb (106.6 kg)  11/23/23 236 lb 9.6 oz (107.3 kg)  10/23/23 237 lb (107.5 kg)    LMP 10/30/2023 (Exact Date)   Assessment and Plan:  Problem List Items Addressed This Visit   None   No follow-ups on file.    Reubin Milan, MD [facility] [place]

## 2023-12-19 ENCOUNTER — Ambulatory Visit
Admission: RE | Admit: 2023-12-19 | Discharge: 2023-12-19 | Disposition: A | Discharge status: Home / Self Care | Source: Ambulatory Visit | Attending: Cardiology | Admitting: Cardiology

## 2023-12-19 DIAGNOSIS — R0600 Dyspnea, unspecified: Secondary | ICD-10-CM | POA: Insufficient documentation

## 2023-12-19 DIAGNOSIS — R0609 Other forms of dyspnea: Secondary | ICD-10-CM | POA: Diagnosis present

## 2023-12-19 LAB — ECHOCARDIOGRAM COMPLETE
AR max vel: 4.11 cm2
AV Area VTI: 4.56 cm2
AV Area mean vel: 4.24 cm2
AV Mean grad: 2 mmHg
AV Peak grad: 5 mmHg
Ao pk vel: 1.12 m/s
Area-P 1/2: 4.54 cm2
MV VTI: 3.33 cm2
S' Lateral: 2.9 cm

## 2023-12-19 NOTE — Progress Notes (Signed)
*  PRELIMINARY RESULTS* Echocardiogram 2D Echocardiogram has been performed.  Cristela Blue 12/19/2023, 9:47 AM

## 2023-12-20 ENCOUNTER — Encounter: Payer: Self-pay | Admitting: Cardiology

## 2023-12-21 ENCOUNTER — Ambulatory Visit

## 2023-12-24 ENCOUNTER — Other Ambulatory Visit: Payer: Self-pay

## 2023-12-24 MED ORDER — ONDANSETRON 4 MG PO TBDP
4.0000 mg | ORAL_TABLET | Freq: Four times a day (QID) | ORAL | 0 refills | Status: DC | PRN
  Filled 2023-12-24: qty 10, 3d supply, fill #0

## 2023-12-24 MED ORDER — OXYCODONE HCL 5 MG PO TABS
5.0000 mg | ORAL_TABLET | ORAL | 0 refills | Status: DC | PRN
Start: 2023-12-24 — End: 2024-01-21
  Filled 2023-12-24: qty 42, 7d supply, fill #0

## 2023-12-24 MED ORDER — SENNOSIDES 8.6 MG PO TABS
2.0000 | ORAL_TABLET | Freq: Every day | ORAL | 0 refills | Status: DC
  Filled 2023-12-24: qty 28, 14d supply, fill #0

## 2023-12-24 MED ORDER — POLYETHYLENE GLYCOL 3350 17 GM/SCOOP PO POWD
17.0000 g | Freq: Every day | ORAL | 0 refills | Status: DC
  Filled 2023-12-24: qty 238, 14d supply, fill #0

## 2023-12-24 MED ORDER — METHOCARBAMOL 500 MG PO TABS
500.0000 mg | ORAL_TABLET | Freq: Four times a day (QID) | ORAL | 2 refills | Status: DC | PRN
  Filled 2023-12-24: qty 40, 10d supply, fill #0
  Filled 2024-02-11: qty 40, 10d supply, fill #1

## 2023-12-24 MED ORDER — CELECOXIB 200 MG PO CAPS
200.0000 mg | ORAL_CAPSULE | Freq: Two times a day (BID) | ORAL | 0 refills | Status: DC
Start: 2023-12-24 — End: 2024-04-25
  Filled 2023-12-24: qty 60, 30d supply, fill #0

## 2023-12-24 MED ORDER — TRANEXAMIC ACID 650 MG PO TABS
1950.0000 mg | ORAL_TABLET | Freq: Every day | ORAL | 0 refills | Status: DC
Start: 2023-12-24 — End: 2024-02-18
  Filled 2023-12-24: qty 9, 3d supply, fill #0

## 2023-12-24 MED ORDER — ASPIRIN 81 MG PO CHEW
81.0000 mg | CHEWABLE_TABLET | Freq: Two times a day (BID) | ORAL | 0 refills | Status: DC
Start: 2023-12-24 — End: 2024-02-18
  Filled 2023-12-24: qty 60, 30d supply, fill #0

## 2023-12-25 ENCOUNTER — Telehealth: Payer: Self-pay

## 2023-12-25 NOTE — Telephone Encounter (Signed)
-----   Message from Ralene Burger sent at 12/25/2023  8:41 AM EDT ----- Echocardiogram looks normal

## 2023-12-25 NOTE — Telephone Encounter (Signed)
 Patient notified through my chart.

## 2023-12-27 NOTE — Patient Instructions (Addendum)
 SURGICAL WAITING ROOM VISITATION  Patients having surgery or a procedure may have no more than 2 support people in the waiting area - these visitors may rotate.    Children under the age of 65 must have an adult with them who is not the patient.  Due to an increase in RSV and influenza rates and associated hospitalizations, children ages 73 and under may not visit patients in [facility] hospitals.  Visitors with respiratory illnesses are discouraged from visiting and should remain at home.  If the patient needs to stay at the hospital during part of their recovery, the visitor guidelines for inpatient rooms apply. Pre-op nurse will coordinate an appropriate time for 1 support person to accompany patient in pre-op.  This support person may not rotate.    Please refer to the [facility] website for the visitor guidelines for Inpatients (after your surgery is over and you are in a regular room).    Your procedure is scheduled on: 01/08/24   Report to [facility] Main Entrance    Report to admitting at 7:35 AM   Call this number if you have problems the morning of surgery [phone]   Do not eat food :After Midnight.   After Midnight you may have the following liquids until 7:05 AM DAY OF SURGERY  Water Non-Citrus Juices (without pulp, NO RED-Apple, Whetsel grape, Melnik cranberry) Black Coffee (NO MILK/CREAM OR CREAMERS, sugar ok)  Clear Tea (NO MILK/CREAM OR CREAMERS, sugar ok) regular and decaf                             Plain Jell-O (NO RED)                                           Fruit ices (not with fruit pulp, NO RED)                                     Popsicles (NO RED)                                                               Sports drinks like Gatorade (NO RED)   The day of surgery:  Drink ONE (1) Pre-Surgery Clear Ensure at 7:05 AM the morning of surgery. Drink in one sitting. Do not sip.  This drink was given to you during your hospital  pre-op  appointment visit. Nothing else to drink after completing the  Pre-Surgery Clear Ensure.          If you have questions, please contact your surgeon's office.   FOLLOW BOWEL PREP AND ANY ADDITIONAL PRE OP INSTRUCTIONS YOU RECEIVED FROM YOUR SURGEON'S OFFICE!!!     Oral Hygiene is also important to reduce your risk of infection.                                    Remember - BRUSH YOUR TEETH THE MORNING OF SURGERY WITH YOUR REGULAR TOOTHPASTE  DENTURES WILL  BE REMOVED PRIOR TO SURGERY PLEASE DO NOT APPLY "Poly grip" OR ADHESIVES!!!   Stop all vitamins and herbal supplements 7 days before surgery.   Take these medicines the morning of surgery with A SIP OF WATER: Inhalers, Synthroid                                You may not have any metal on your body including hair pins, jewelry, and body piercing             Do not wear make-up, lotions, powders, perfumes, or deodorant  Do not wear nail polish including gel and S&S, artificial/acrylic nails, or any other type of covering on natural nails including finger and toenails. If you have artificial nails, gel coating, etc. that needs to be removed by a nail salon please have this removed prior to surgery or surgery may need to be canceled/ delayed if the surgeon/ anesthesia feels like they are unable to be safely monitored.   Do not shave  48 hours prior to surgery.    Do not bring valuables to the hospital. [facility] IS NOT             RESPONSIBLE   FOR VALUABLES.   Contacts, glasses, dentures or bridgework may not be worn into surgery.   Bring small overnight bag day of surgery.   DO NOT BRING YOUR HOME MEDICATIONS TO THE HOSPITAL. PHARMACY WILL DISPENSE MEDICATIONS LISTED ON YOUR MEDICATION LIST TO YOU DURING YOUR ADMISSION IN THE HOSPITAL!              Please read over the following fact sheets you were given: IF YOU HAVE QUESTIONS ABOUT YOUR PRE-OP INSTRUCTIONS PLEASE CALL [name]    If you received a COVID test  during your pre-op visit  it is requested that you wear a mask when out in public, stay away from anyone that may not be feeling well and notify your surgeon if you develop symptoms. If you test positive for Covid or have been in contact with anyone that has tested positive in the last 10 days please notify you surgeon.      Pre-operative 5 CHG Bath Instructions   You can play a key role in reducing the risk of infection after surgery. Your skin needs to be as free of germs as possible. You can reduce the number of germs on your skin by washing with CHG (chlorhexidine  gluconate) soap before surgery. CHG is an antiseptic soap that kills germs and continues to kill germs even after washing.   DO NOT use if you have an allergy to chlorhexidine /CHG or antibacterial soaps. If your skin becomes reddened or irritated, stop using the CHG and notify one of our RNs at [phone].   Please shower with the CHG soap starting 4 days before surgery using the following schedule:     Please keep in mind the following:  DO NOT shave, including legs and underarms, starting the day of your first shower.   You may shave your face at any point before/day of surgery.  Place clean sheets on your bed the day you start using CHG soap. Use a clean washcloth (not used since being washed) for each shower. DO NOT sleep with pets once you start using the CHG.   CHG Shower Instructions:  If you choose to wash your hair and private area, wash first with your normal shampoo/soap.  After you use  shampoo/soap, rinse your hair and body thoroughly to remove shampoo/soap residue.  Turn the water OFF and apply about 3 tablespoons (45 ml) of CHG soap to a CLEAN washcloth.  Apply CHG soap ONLY FROM YOUR NECK DOWN TO YOUR TOES (washing for 3-5 minutes)  DO NOT use CHG soap on face, private areas, open wounds, or sores.  Pay special attention to the area where your surgery is being performed.  If you are having back surgery,  having someone wash your back for you may be helpful. Wait 2 minutes after CHG soap is applied, then you may rinse off the CHG soap.  Pat dry with a clean towel  Put on clean clothes/pajamas   If you choose to wear lotion, please use ONLY the CHG-compatible lotions on the back of this paper.     Additional instructions for the day of surgery: DO NOT APPLY any lotions, deodorants, cologne, or perfumes.   Put on clean/comfortable clothes.  Brush your teeth.  Ask your nurse before applying any prescription medications to the skin.      CHG Compatible Lotions   Aveeno Moisturizing lotion  Cetaphil Moisturizing Cream  Cetaphil Moisturizing Lotion  Clairol Herbal Essence Moisturizing Lotion, Dry Skin  Clairol Herbal Essence Moisturizing Lotion, Extra Dry Skin  Clairol Herbal Essence Moisturizing Lotion, Normal Skin  Curel Age Defying Therapeutic Moisturizing Lotion with Alpha Hydroxy  Curel Extreme Care Body Lotion  Curel Soothing Hands Moisturizing Hand Lotion  Curel Therapeutic Moisturizing Cream, Fragrance-Free  Curel Therapeutic Moisturizing Lotion, Fragrance-Free  Curel Therapeutic Moisturizing Lotion, Original Formula  Eucerin Daily Replenishing Lotion  Eucerin Dry Skin Therapy Plus Alpha Hydroxy Crme  Eucerin Dry Skin Therapy Plus Alpha Hydroxy Lotion  Eucerin Original Crme  Eucerin Original Lotion  Eucerin Plus Crme Eucerin Plus Lotion  Eucerin TriLipid Replenishing Lotion  Keri Anti-Bacterial Hand Lotion  Keri Deep Conditioning Original Lotion Dry Skin Formula Softly Scented  Keri Deep Conditioning Original Lotion, Fragrance Free Sensitive Skin Formula  Keri Lotion Fast Absorbing Fragrance Free Sensitive Skin Formula  Keri Lotion Fast Absorbing Softly Scented Dry Skin Formula  Keri Original Lotion  Keri Skin Renewal Lotion Keri Silky Smooth Lotion  Keri Silky Smooth Sensitive Skin Lotion  Nivea Body Creamy Conditioning Oil  Nivea Body Extra Enriched Lotion  Nivea  Body Original Lotion  Nivea Body Sheer Moisturizing Lotion Nivea Crme  Nivea Skin Firming Lotion  NutraDerm 30 Skin Lotion  NutraDerm Skin Lotion  NutraDerm Therapeutic Skin Cream  NutraDerm Therapeutic Skin Lotion  ProShield Protective Hand Cream  Provon moisturizing lotion   Incentive Spirometer  An incentive spirometer is a tool that can help keep your lungs clear and active. This tool measures how well you are filling your lungs with each breath. Taking long deep breaths may help reverse or decrease the chance of developing breathing (pulmonary) problems (especially infection) following: A long period of time when you are unable to move or be active. BEFORE THE PROCEDURE  If the spirometer includes an indicator to show your best effort, your nurse or respiratory therapist will set it to a desired goal. If possible, sit up straight or lean slightly forward. Try not to slouch. Hold the incentive spirometer in an upright position. INSTRUCTIONS FOR USE  Sit on the edge of your bed if possible, or sit up as far as you can in bed or on a chair. Hold the incentive spirometer in an upright position. Breathe out normally. Place the mouthpiece in your mouth and seal your lips  tightly around it. Breathe in slowly and as deeply as possible, raising the piston or the ball toward the top of the column. Hold your breath for 3-5 seconds or for as long as possible. Allow the piston or ball to fall to the bottom of the column. Remove the mouthpiece from your mouth and breathe out normally. Rest for a few seconds and repeat Steps 1 through 7 at least 10 times every 1-2 hours when you are awake. Take your time and take a few normal breaths between deep breaths. The spirometer may include an indicator to show your best effort. Use the indicator as a goal to work toward during each repetition. After each set of 10 deep breaths, practice coughing to be sure your lungs are clear. If you have an incision  (the cut made at the time of surgery), support your incision when coughing by placing a pillow or rolled up towels firmly against it. Once you are able to get out of bed, walk around indoors and cough well. You may stop using the incentive spirometer when instructed by your caregiver.  RISKS AND COMPLICATIONS Take your time so you do not get dizzy or light-headed. If you are in pain, you may need to take or ask for pain medication before doing incentive spirometry. It is harder to take a deep breath if you are having pain. AFTER USE Rest and breathe slowly and easily. It can be helpful to keep track of a log of your progress. Your caregiver can provide you with a simple table to help with this. If you are using the spirometer at home, follow these instructions: SEEK MEDICAL CARE IF:  You are having difficultly using the spirometer. You have trouble using the spirometer as often as instructed. Your pain medication is not giving enough relief while using the spirometer. You develop fever of 100.5 F (38.1 C) or higher. SEEK IMMEDIATE MEDICAL CARE IF:  You cough up bloody sputum that had not been present before. You develop fever of 102 F (38.9 C) or greater. You develop worsening pain at or near the incision site. MAKE SURE YOU:  Understand these instructions. Will watch your condition. Will get help right away if you are not doing well or get worse. Document Released: 01/08/2007 Document Revised: 11/20/2011 Document Reviewed: 03/11/2007 [facility] Patient Information 2014 [place], [place].   ________________________________________________________________________

## 2023-12-27 NOTE — Progress Notes (Addendum)
 COVID Vaccine Completed: yes  Date of COVID positive in last 90 days: no  PCP - Janna Melter, MD Cardiologist - Ralene Burger, MD LOV 11/23/23  Medical clearance by Dr. Gala Jubilee in media tab dated 12/19/23 Cardiac  clearance by Morey Ar, NP 12/14/23 in Epic   Coronary CT- 11/29/23 Epic Chest x-ray - 11/27/23 Epic EKG - 11/27/23 Epic Stress Test - n/a ECHO - 12/19/23 Epic Cardiac Cath - n/a Pacemaker/ICD device last checked: n/a Spinal Cord Stimulator: n/a  Bowel Prep - no  Sleep Study - n/a CPAP -   Fasting Blood Sugar - preDM, no meds or checks at home  Checks Blood Sugar _____ times a day  Last dose of GLP1 agonist-   GLP1 instructions:  Hold 7 days before surgery    Last dose of SGLT-2 inhibitors-  N/A SGLT-2 instructions:  Hold 3 days before surgery    Blood Thinner Instructions:  Last dose:   Time: Aspirin  Instructions:  Last Dose:  Activity level: Can go up a flight of stairs and perform activities of daily living without stopping and without symptoms of chest pain or shortness of breath. Slow with stairs due to knees.    Anesthesia review: recent CP got cardiac clearance by cards, asthma  Patient denies shortness of breath, fever, cough and chest pain at PAT appointment  Patient verbalized understanding of instructions that were given to them at the PAT appointment. Patient was also instructed that they will need to review over the PAT instructions again at home before surgery.

## 2023-12-31 ENCOUNTER — Other Ambulatory Visit: Payer: Self-pay

## 2023-12-31 ENCOUNTER — Encounter (HOSPITAL_COMMUNITY)
Admission: RE | Admit: 2023-12-31 | Discharge: 2023-12-31 | Disposition: A | Discharge status: Home / Self Care | Source: Ambulatory Visit | Attending: Orthopedic Surgery | Admitting: Orthopedic Surgery

## 2023-12-31 ENCOUNTER — Encounter (HOSPITAL_COMMUNITY): Payer: Self-pay

## 2023-12-31 VITALS — BP 132/77 | HR 61 | Temp 97.8°F | Resp 16 | Ht 65.0 in | Wt 233.0 lb

## 2023-12-31 DIAGNOSIS — Z87891 Personal history of nicotine dependence: Secondary | ICD-10-CM | POA: Insufficient documentation

## 2023-12-31 DIAGNOSIS — Z01812 Encounter for preprocedural laboratory examination: Secondary | ICD-10-CM | POA: Diagnosis present

## 2023-12-31 DIAGNOSIS — Z01818 Encounter for other preprocedural examination: Secondary | ICD-10-CM

## 2023-12-31 DIAGNOSIS — M1712 Unilateral primary osteoarthritis, left knee: Secondary | ICD-10-CM | POA: Insufficient documentation

## 2023-12-31 DIAGNOSIS — R0789 Other chest pain: Secondary | ICD-10-CM | POA: Diagnosis not present

## 2023-12-31 LAB — BASIC METABOLIC PANEL WITH GFR
Anion gap: 8 (ref 5–15)
BUN: 12 mg/dL (ref 6–20)
CO2: 24 mmol/L (ref 22–32)
Calcium: 9.3 mg/dL (ref 8.9–10.3)
Chloride: 107 mmol/L (ref 98–111)
Creatinine, Ser: 0.52 mg/dL (ref 0.44–1.00)
GFR, Estimated: >60 mL/min (ref >60)
Glucose, Bld: 96 mg/dL (ref 70–99)
Potassium: 4.3 mmol/L (ref 3.5–5.1)
Sodium: 139 mmol/L (ref 135–145)

## 2023-12-31 LAB — CBC
HCT: 41.7 % (ref 36.0–46.0)
Hemoglobin: 13.2 g/dL (ref 12.0–15.0)
MCH: 28.4 pg (ref 26.0–34.0)
MCHC: 31.7 g/dL (ref 30.0–36.0)
MCV: 89.7 fL (ref 80.0–100.0)
Platelets: 425 10*3/uL — ABNORMAL HIGH (ref 150–400)
RBC: 4.65 MIL/uL (ref 3.87–5.11)
RDW: 13.3 % (ref 11.5–15.5)
WBC: 8.8 10*3/uL (ref 4.0–10.5)
nRBC: 0 % (ref 0.0–0.2)

## 2023-12-31 LAB — SURGICAL PCR SCREEN
MRSA, PCR: NEGATIVE
Staphylococcus aureus: NEGATIVE

## 2024-01-02 NOTE — Progress Notes (Signed)
 Anesthesia Chart Review   Case: [id number] Date/Time: 01/08/24 0950   Procedure: ARTHROPLASTY, KNEE, TOTAL (Left: Knee)   Anesthesia type: Spinal   Pre-op diagnosis: Left knee osteoarthritis   Location: [facility] ROOM 09 / [facility]   Surgeons: Claiborne Crew, MD       DISCUSSION:48 y.o. former smoker with h/o GERD, asthma, ankylosing spondylitis, left knee OA scheduled for above procedure 01/08/2024 with Dr. Claiborne Crew.   Per cardiology preoperative evaluation 12/14/2023, "Chart reviewed as part of pre-operative protocol coverage. Hailey [name] was last seen on 11/23/2023 by Dr. Krasowski.  She underwent coronary CTA on 11/29/2023 which demonstrated a calcium score of 0 with no evidence of CAD.   Therefore, based on ACC/AHA guidelines, the patient would be an acceptable risk for the planned procedure without further cardiovascular testing. "  Normal Echo 12/19/2023.   Clearance received from PCP which states pt is cleared medically at low risk.  VS: BP 132/77   Pulse 61   Temp 36.6 C (Oral)   Resp 16   Ht 5\' 5"  (1.651 m)   Wt 105.7 kg   LMP 10/30/2023 (Approximate)   SpO2 98%   BMI 38.77 kg/m   PROVIDERS: Sheron Dixons, MD  Newell Bard, MD is Cardiologist  LABS: Labs reviewed: Acceptable for surgery. (all labs ordered are listed, but only abnormal results are displayed)  Labs Reviewed  CBC - Abnormal; Notable for the following components:      Result Value   Platelets 425 (*)    All other components within normal limits  SURGICAL PCR SCREEN  BASIC METABOLIC PANEL WITH GFR     IMAGES:   EKG:   CV: Echo 12/19/2023 1. Left ventricular ejection fraction, by estimation, is 60 to 65%. The  left ventricle has normal function. The left ventricle has no regional  wall motion abnormalities. Left ventricular diastolic parameters were  normal. The average left ventricular  global longitudinal strain is -17.3 %. The global longitudinal strain is  normal.   2. Right  ventricular systolic function is normal. The right ventricular  size is normal.   3. The mitral valve is normal in structure. No evidence of mitral valve  regurgitation. No evidence of mitral stenosis.   4. The aortic valve is normal in structure. Aortic valve regurgitation is  not visualized. No aortic stenosis is present.   5. The inferior vena cava is normal in size with greater than 50%  respiratory variability, suggesting right atrial pressure of 3 mmHg.  Past Medical History:  Diagnosis Date   Allergic state    Allergy    Ankylosing spondylitis (HCC)    followed by rheumatology Dr. Lydia Sams   Arthritis    Asthma    Cervical disc disease    Depression    Enlarged lymph nodes    GERD (gastroesophageal reflux disease)    H/O colonoscopy    HPV (human papilloma virus) infection    Hypothyroidism    Interstitial cystitis    LGSIL (low grade squamous intraepithelial dysplasia)    Paresthesia    Status post laparoscopic cholecystectomy 07/13/2016    Past Surgical History:  Procedure Laterality Date   BREAST BIOPSY Right 10/18/2015   bx/clip- neg   CHOLECYSTECTOMY N/A 07/07/2016   Procedure: LAPAROSCOPIC CHOLECYSTECTOMY;  Surgeon: Emmalene Hare, MD;  Location: [facility] ORS;  Service: General;  Laterality: N/A;   COLONOSCOPY WITH PROPOFOL  N/A 10/11/2015   Procedure: COLONOSCOPY WITH PROPOFOL ;  Surgeon: Deveron Fly, MD;  Location: [facility] ENDOSCOPY;  Service: Endoscopy;  Laterality: N/A;   ESOPHAGOGASTRODUODENOSCOPY (EGD) WITH PROPOFOL  N/A 10/11/2015   Procedure: ESOPHAGOGASTRODUODENOSCOPY (EGD) WITH PROPOFOL ;  Surgeon: Deveron Fly, MD;  Location: [facility] ENDOSCOPY;  Service: Endoscopy;  Laterality: N/A;   TONSILLECTOMY     TOTAL THYROIDECTOMY  2005   goiter    MEDICATIONS:  albuterol  (VENTOLIN  HFA) 108 (90 Base) MCG/ACT inhaler   aspirin  81 MG chewable tablet   celecoxib  (CELEBREX ) 100 MG capsule   celecoxib  (CELEBREX ) 200 MG capsule   fluticasone -salmeterol (ADVAIR  DISKUS)  100-50 MCG/ACT AEPB   methocarbamol  (ROBAXIN ) 500 MG tablet   ondansetron  (ZOFRAN -ODT) 4 MG disintegrating tablet   oxyCODONE  (OXY IR/ROXICODONE ) 5 MG immediate release tablet   polyethylene glycol powder (GLYCOLAX /MIRALAX ) 17 GM/SCOOP powder   senna (SENOKOT) 8.6 MG tablet   SYNTHROID  200 MCG tablet   tranexamic acid  (LYSTEDA ) 650 MG TABS tablet   No current facility-administered medications for this encounter.     Chick Cotton Ward, PA-C [facility] Pre-Surgical Testing [phone]

## 2024-01-04 ENCOUNTER — Telehealth: Payer: Self-pay

## 2024-01-04 NOTE — Telephone Encounter (Signed)
 Received a message that the following message below was sent to the patient on MyChart but it had not been read by the patient:  "Dr. Gordan Latina sent this to our Pre-op Team on 12-06-23 "Yes, all test came back fine good to proceed with surgery ". They should have communicated with your surgeon.  Thank you!"  Attempted to call the patient to inform her that her test came back fine and she was good to proceed with her surgery. Patient did not answer the phone and a message was left for her to call the office back.

## 2024-01-07 NOTE — Anesthesia Preprocedure Evaluation (Signed)
 Anesthesia Evaluation  Patient identified by MRN, date of birth, ID band Patient awake    Reviewed: Allergy & Precautions, NPO status , Patient's Chart, lab work & pertinent test results  Airway Mallampati: III  TM Distance: >3 FB Neck ROM: Full    Dental  (+) Teeth Intact, Dental Advisory Given   Pulmonary asthma , former smoker   Pulmonary exam normal breath sounds clear to auscultation       Cardiovascular negative cardio ROS Normal cardiovascular exam Rhythm:Regular Rate:Normal     Neuro/Psych  PSYCHIATRIC DISORDERS  Depression    negative neurological ROS     GI/Hepatic Neg liver ROS,GERD  Controlled,,  Endo/Other  Hypothyroidism  Obesity BMI 39  Renal/GU negative Renal ROS  negative genitourinary   Musculoskeletal  (+) Arthritis , Osteoarthritis,  Anklyosing spondylitis, 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  Difficult epidural placement when laboring 2006   Abdominal  (+) + obese  Peds  Hematology negative hematology ROS (+) Hb 13.2, plt 425   Anesthesia Other Findings   Reproductive/Obstetrics negative OB ROS                             Anesthesia Physical Anesthesia Plan  ASA: 2  Anesthesia Plan: Spinal, Regional and MAC   Post-op Pain Management: Tylenol  PO (pre-op)*, Toradol  IV (intra-op)* and Regional block*   Induction:   PONV Risk Score and Plan: 2 and Propofol  infusion and TIVA  Airway Management Planned: Natural Airway and Nasal Cannula  Additional Equipment: None  Intra-op Plan:   Post-operative Plan:   Informed Consent: I have reviewed the patients History and Physical, chart, labs and discussed the procedure including the risks, benefits and alternatives for the proposed anesthesia with the patient or authorized representative who has indicated his/her understanding and acceptance.       Plan Discussed with: CRNA  Anesthesia Plan Comments: (D/w pt  will attempt spinal, GA/LMA as backup if unable)       Anesthesia Quick Evaluation

## 2024-01-08 ENCOUNTER — Encounter (HOSPITAL_COMMUNITY)
Admission: RE | Disposition: A | Discharge status: Home / Self Care | Payer: Self-pay | Source: Home / Self Care | Attending: Orthopedic Surgery

## 2024-01-08 ENCOUNTER — Observation Stay (HOSPITAL_COMMUNITY)
Admission: RE | Admit: 2024-01-08 | Discharge: 2024-01-09 | Disposition: A | Discharge status: Home / Self Care | Payer: 59 | Attending: Orthopedic Surgery | Admitting: Orthopedic Surgery

## 2024-01-08 ENCOUNTER — Other Ambulatory Visit: Payer: Self-pay

## 2024-01-08 ENCOUNTER — Ambulatory Visit (HOSPITAL_BASED_OUTPATIENT_CLINIC_OR_DEPARTMENT_OTHER): Payer: Self-pay | Admitting: Anesthesiology

## 2024-01-08 ENCOUNTER — Encounter (HOSPITAL_COMMUNITY): Payer: Self-pay | Admitting: Orthopedic Surgery

## 2024-01-08 ENCOUNTER — Ambulatory Visit (HOSPITAL_COMMUNITY): Payer: Self-pay | Admitting: Physician Assistant

## 2024-01-08 DIAGNOSIS — M1712 Unilateral primary osteoarthritis, left knee: Principal | ICD-10-CM | POA: Insufficient documentation

## 2024-01-08 DIAGNOSIS — Z79899 Other long term (current) drug therapy: Secondary | ICD-10-CM | POA: Diagnosis not present

## 2024-01-08 DIAGNOSIS — Z91040 Latex allergy status: Secondary | ICD-10-CM | POA: Insufficient documentation

## 2024-01-08 DIAGNOSIS — Z01818 Encounter for other preprocedural examination: Principal | ICD-10-CM

## 2024-01-08 DIAGNOSIS — J45909 Unspecified asthma, uncomplicated: Secondary | ICD-10-CM | POA: Insufficient documentation

## 2024-01-08 DIAGNOSIS — E039 Hypothyroidism, unspecified: Secondary | ICD-10-CM | POA: Insufficient documentation

## 2024-01-08 DIAGNOSIS — Z7982 Long term (current) use of aspirin: Secondary | ICD-10-CM | POA: Insufficient documentation

## 2024-01-08 DIAGNOSIS — Z96652 Presence of left artificial knee joint: Secondary | ICD-10-CM

## 2024-01-08 DIAGNOSIS — Z96659 Presence of unspecified artificial knee joint: Secondary | ICD-10-CM

## 2024-01-08 DIAGNOSIS — Z87891 Personal history of nicotine dependence: Secondary | ICD-10-CM | POA: Insufficient documentation

## 2024-01-08 DIAGNOSIS — G8918 Other acute postprocedural pain: Secondary | ICD-10-CM | POA: Diagnosis not present

## 2024-01-08 HISTORY — PX: TOTAL KNEE ARTHROPLASTY: SHX125

## 2024-01-08 LAB — POCT PREGNANCY, URINE: Preg Test, Ur: NEGATIVE

## 2024-01-08 SURGERY — ARTHROPLASTY, KNEE, TOTAL
Anesthesia: Monitor Anesthesia Care | Site: Knee | Laterality: Left

## 2024-01-08 MED ORDER — OXYCODONE HCL 5 MG PO TABS
ORAL_TABLET | ORAL | Status: AC
  Filled 2024-01-08: qty 1

## 2024-01-08 MED ORDER — DIPHENHYDRAMINE HCL 12.5 MG/5ML PO ELIX: 12.5000 mg | ORAL_SOLUTION | ORAL | Status: DC | PRN

## 2024-01-08 MED ORDER — ACETAMINOPHEN 500 MG PO TABS
1000.0000 mg | ORAL_TABLET | Freq: Once | ORAL | Status: AC
  Administered 2024-01-08: 1000 mg via ORAL
  Filled 2024-01-08: qty 2

## 2024-01-08 MED ORDER — ROPIVACAINE HCL 5 MG/ML IJ SOLN
INTRAMUSCULAR | Status: DC | PRN
Start: 2024-01-08 — End: 2024-01-08
  Administered 2024-01-08: 30 mL via PERINEURAL

## 2024-01-08 MED ORDER — PROPOFOL 500 MG/50ML IV EMUL
INTRAVENOUS | Status: DC | PRN
  Administered 2024-01-08: 50 ug/kg/min via INTRAVENOUS

## 2024-01-08 MED ORDER — ALUM & MAG HYDROXIDE-SIMETH 200-200-20 MG/5ML PO SUSP
30.0000 mL | ORAL | Status: DC | PRN
  Administered 2024-01-09: 30 mL via ORAL
  Filled 2024-01-08: qty 30

## 2024-01-08 MED ORDER — DEXAMETHASONE SODIUM PHOSPHATE 10 MG/ML IJ SOLN
INTRAMUSCULAR | Status: AC
  Filled 2024-01-08: qty 1

## 2024-01-08 MED ORDER — ALBUTEROL SULFATE (2.5 MG/3ML) 0.083% IN NEBU: 2.5000 mg | INHALATION_SOLUTION | Freq: Four times a day (QID) | RESPIRATORY_TRACT | Status: DC | PRN

## 2024-01-08 MED ORDER — ACETAMINOPHEN 500 MG PO TABS
1000.0000 mg | ORAL_TABLET | Freq: Four times a day (QID) | ORAL | Status: DC
  Administered 2024-01-08 – 2024-01-09 (×3): 1000 mg via ORAL
  Filled 2024-01-08 (×3): qty 2

## 2024-01-08 MED ORDER — LACTATED RINGERS IV SOLN: INTRAVENOUS | Status: DC

## 2024-01-08 MED ORDER — ONDANSETRON HCL 4 MG PO TABS: 4.0000 mg | ORAL_TABLET | Freq: Four times a day (QID) | ORAL | Status: DC | PRN

## 2024-01-08 MED ORDER — STERILE WATER FOR IRRIGATION IR SOLN
Status: DC | PRN
  Administered 2024-01-08: 1000 mL

## 2024-01-08 MED ORDER — SODIUM CHLORIDE (PF) 0.9 % IJ SOLN
INTRAMUSCULAR | Status: AC
  Filled 2024-01-08: qty 30

## 2024-01-08 MED ORDER — OXYCODONE HCL 5 MG/5ML PO SOLN: 5.0000 mg | Freq: Once | ORAL | Status: AC | PRN

## 2024-01-08 MED ORDER — MIDAZOLAM HCL 5 MG/5ML IJ SOLN
INTRAMUSCULAR | Status: DC | PRN
  Administered 2024-01-08 (×2): .5 mg via INTRAVENOUS

## 2024-01-08 MED ORDER — EPHEDRINE 5 MG/ML INJ
INTRAVENOUS | Status: AC
  Filled 2024-01-08: qty 5

## 2024-01-08 MED ORDER — LEVOTHYROXINE SODIUM 100 MCG PO TABS
200.0000 ug | ORAL_TABLET | Freq: Every day | ORAL | Status: DC
  Administered 2024-01-09: 200 ug via ORAL
  Filled 2024-01-08: qty 2

## 2024-01-08 MED ORDER — MENTHOL 3 MG MT LOZG: 1.0000 | LOZENGE | OROMUCOSAL | Status: DC | PRN

## 2024-01-08 MED ORDER — ONDANSETRON HCL 4 MG/2ML IJ SOLN
INTRAMUSCULAR | Status: DC | PRN
  Administered 2024-01-08: 4 mg via INTRAVENOUS

## 2024-01-08 MED ORDER — CHLORHEXIDINE GLUCONATE 0.12 % MT SOLN
15.0000 mL | Freq: Once | OROMUCOSAL | Status: AC
  Administered 2024-01-08: 15 mL via OROMUCOSAL

## 2024-01-08 MED ORDER — MIDAZOLAM HCL 2 MG/2ML IJ SOLN
INTRAMUSCULAR | Status: AC
  Filled 2024-01-08: qty 2

## 2024-01-08 MED ORDER — METOCLOPRAMIDE HCL 5 MG/ML IJ SOLN: 5.0000 mg | Freq: Three times a day (TID) | INTRAMUSCULAR | Status: DC | PRN

## 2024-01-08 MED ORDER — MEPERIDINE HCL 50 MG/ML IJ SOLN: 6.2500 mg | INTRAMUSCULAR | Status: DC | PRN

## 2024-01-08 MED ORDER — SODIUM CHLORIDE (PF) 0.9 % IJ SOLN
INTRAMUSCULAR | Status: DC | PRN
  Administered 2024-01-08: 61 mL

## 2024-01-08 MED ORDER — DEXAMETHASONE SODIUM PHOSPHATE 10 MG/ML IJ SOLN
8.0000 mg | Freq: Once | INTRAMUSCULAR | Status: AC
  Administered 2024-01-08 (×2): 4 mg via INTRAVENOUS

## 2024-01-08 MED ORDER — MIDAZOLAM HCL 2 MG/2ML IJ SOLN
1.0000 mg | Freq: Once | INTRAMUSCULAR | Status: AC
  Administered 2024-01-08: 2 mg via INTRAVENOUS
  Filled 2024-01-08: qty 2

## 2024-01-08 MED ORDER — FENTANYL CITRATE (PF) 100 MCG/2ML IJ SOLN
INTRAMUSCULAR | Status: AC
  Filled 2024-01-08: qty 2

## 2024-01-08 MED ORDER — SENNA 8.6 MG PO TABS
2.0000 | ORAL_TABLET | Freq: Every day | ORAL | Status: DC
  Administered 2024-01-08: 17.2 mg via ORAL
  Filled 2024-01-08: qty 2

## 2024-01-08 MED ORDER — BISACODYL 10 MG RE SUPP: 10.0000 mg | Freq: Every day | RECTAL | Status: DC | PRN

## 2024-01-08 MED ORDER — HYDROMORPHONE HCL 1 MG/ML IJ SOLN: 0.2500 mg | INTRAMUSCULAR | Status: DC | PRN

## 2024-01-08 MED ORDER — FENTANYL CITRATE (PF) 100 MCG/2ML IJ SOLN
INTRAMUSCULAR | Status: AC
Start: 2024-01-08 — End: ?
  Filled 2024-01-08: qty 2

## 2024-01-08 MED ORDER — POLYETHYLENE GLYCOL 3350 17 G PO PACK
17.0000 g | PACK | Freq: Two times a day (BID) | ORAL | Status: DC
  Administered 2024-01-08 – 2024-01-09 (×2): 17 g via ORAL
  Filled 2024-01-08 (×2): qty 1

## 2024-01-08 MED ORDER — FENTANYL CITRATE PF 50 MCG/ML IJ SOSY
50.0000 ug | PREFILLED_SYRINGE | Freq: Once | INTRAMUSCULAR | Status: AC
  Administered 2024-01-08: 100 ug via INTRAVENOUS
  Filled 2024-01-08: qty 2

## 2024-01-08 MED ORDER — PHENOL 1.4 % MT LIQD: 1.0000 | OROMUCOSAL | Status: DC | PRN

## 2024-01-08 MED ORDER — ALBUTEROL SULFATE HFA 108 (90 BASE) MCG/ACT IN AERS: 2.0000 | INHALATION_SPRAY | Freq: Four times a day (QID) | RESPIRATORY_TRACT | Status: DC | PRN

## 2024-01-08 MED ORDER — ORAL CARE MOUTH RINSE: 15.0000 mL | Freq: Once | OROMUCOSAL | Status: AC

## 2024-01-08 MED ORDER — SODIUM CHLORIDE 0.9% FLUSH
3.0000 mL | Freq: Two times a day (BID) | INTRAVENOUS | Status: DC
  Administered 2024-01-08 – 2024-01-09 (×2): 10 mL via INTRAVENOUS

## 2024-01-08 MED ORDER — KETAMINE HCL 10 MG/ML IJ SOLN
INTRAMUSCULAR | Status: DC | PRN
  Administered 2024-01-08: 20 mg via INTRAVENOUS

## 2024-01-08 MED ORDER — PHENYLEPHRINE HCL-NACL 20-0.9 MG/250ML-% IV SOLN
INTRAVENOUS | Status: AC
Start: 2024-01-08 — End: 2024-01-08
  Filled 2024-01-08: qty 250

## 2024-01-08 MED ORDER — ASPIRIN 81 MG PO CHEW
81.0000 mg | CHEWABLE_TABLET | Freq: Two times a day (BID) | ORAL | Status: DC
  Administered 2024-01-08 – 2024-01-09 (×2): 81 mg via ORAL
  Filled 2024-01-08 (×2): qty 1

## 2024-01-08 MED ORDER — KETAMINE HCL 50 MG/5ML IJ SOSY
PREFILLED_SYRINGE | INTRAMUSCULAR | Status: AC
  Filled 2024-01-08: qty 5

## 2024-01-08 MED ORDER — KETOROLAC TROMETHAMINE 30 MG/ML IJ SOLN
INTRAMUSCULAR | Status: AC
  Filled 2024-01-08: qty 1

## 2024-01-08 MED ORDER — CEFAZOLIN SODIUM-DEXTROSE 2-4 GM/100ML-% IV SOLN
2.0000 g | INTRAVENOUS | Status: AC
  Administered 2024-01-08: 2 g via INTRAVENOUS
  Filled 2024-01-08: qty 100

## 2024-01-08 MED ORDER — DEXMEDETOMIDINE HCL IN NACL 80 MCG/20ML IV SOLN
INTRAVENOUS | Status: DC | PRN
  Administered 2024-01-08: 8 ug via INTRAVENOUS

## 2024-01-08 MED ORDER — SODIUM CHLORIDE 0.9% FLUSH: 3.0000 mL | INTRAVENOUS | Status: DC | PRN

## 2024-01-08 MED ORDER — POVIDONE-IODINE 10 % EX SWAB: 2.0000 | Freq: Once | CUTANEOUS | Status: DC

## 2024-01-08 MED ORDER — AMISULPRIDE (ANTIEMETIC) 5 MG/2ML IV SOLN: 10.0000 mg | Freq: Once | INTRAVENOUS | Status: DC | PRN

## 2024-01-08 MED ORDER — ONDANSETRON HCL 4 MG/2ML IJ SOLN: 4.0000 mg | Freq: Once | INTRAMUSCULAR | Status: DC | PRN

## 2024-01-08 MED ORDER — DEXAMETHASONE SODIUM PHOSPHATE 10 MG/ML IJ SOLN
INTRAMUSCULAR | Status: DC | PRN
  Administered 2024-01-08: 10 mg

## 2024-01-08 MED ORDER — LIDOCAINE HCL (CARDIAC) PF 100 MG/5ML IV SOSY
PREFILLED_SYRINGE | INTRAVENOUS | Status: DC | PRN
  Administered 2024-01-08: 30 mg via INTRAVENOUS

## 2024-01-08 MED ORDER — OXYCODONE HCL 5 MG PO TABS
5.0000 mg | ORAL_TABLET | Freq: Once | ORAL | Status: AC | PRN
  Administered 2024-01-08: 5 mg via ORAL

## 2024-01-08 MED ORDER — METOCLOPRAMIDE HCL 5 MG PO TABS: 5.0000 mg | ORAL_TABLET | Freq: Three times a day (TID) | ORAL | Status: DC | PRN

## 2024-01-08 MED ORDER — METHOCARBAMOL 500 MG PO TABS: 500.0000 mg | ORAL_TABLET | Freq: Four times a day (QID) | ORAL | Status: DC | PRN

## 2024-01-08 MED ORDER — OXYCODONE HCL 5 MG PO TABS: 10.0000 mg | ORAL_TABLET | ORAL | Status: DC | PRN

## 2024-01-08 MED ORDER — CEFAZOLIN SODIUM-DEXTROSE 2-4 GM/100ML-% IV SOLN
2.0000 g | Freq: Four times a day (QID) | INTRAVENOUS | Status: AC
  Administered 2024-01-08: 2 g via INTRAVENOUS
  Filled 2024-01-08: qty 100

## 2024-01-08 MED ORDER — TRANEXAMIC ACID-NACL 1000-0.7 MG/100ML-% IV SOLN: 1000.0000 mg | Freq: Once | INTRAVENOUS | Status: DC

## 2024-01-08 MED ORDER — CEFAZOLIN SODIUM-DEXTROSE 2-4 GM/100ML-% IV SOLN
INTRAVENOUS | Status: AC
  Administered 2024-01-08: 2 g via INTRAVENOUS
  Filled 2024-01-08: qty 100

## 2024-01-08 MED ORDER — METHOCARBAMOL 1000 MG/10ML IJ SOLN: 500.0000 mg | Freq: Four times a day (QID) | INTRAMUSCULAR | Status: DC | PRN

## 2024-01-08 MED ORDER — FENTANYL CITRATE (PF) 100 MCG/2ML IJ SOLN
INTRAMUSCULAR | Status: DC | PRN
  Administered 2024-01-08: 25 ug via INTRAVENOUS
  Administered 2024-01-08: 50 ug via INTRAVENOUS
  Administered 2024-01-08: 25 ug via INTRAVENOUS

## 2024-01-08 MED ORDER — HYDROMORPHONE HCL 1 MG/ML IJ SOLN: 0.5000 mg | INTRAMUSCULAR | Status: DC | PRN

## 2024-01-08 MED ORDER — TRANEXAMIC ACID-NACL 1000-0.7 MG/100ML-% IV SOLN
1000.0000 mg | INTRAVENOUS | Status: AC
  Administered 2024-01-08: 1000 mg via INTRAVENOUS
  Filled 2024-01-08: qty 100

## 2024-01-08 MED ORDER — SODIUM CHLORIDE 0.9 % IR SOLN
Status: DC | PRN
  Administered 2024-01-08: 1000 mL

## 2024-01-08 MED ORDER — ONDANSETRON HCL 4 MG/2ML IJ SOLN: 4.0000 mg | Freq: Four times a day (QID) | INTRAMUSCULAR | Status: DC | PRN

## 2024-01-08 MED ORDER — 0.9 % SODIUM CHLORIDE (POUR BTL) OPTIME
TOPICAL | Status: DC | PRN
  Administered 2024-01-08: 1000 mL

## 2024-01-08 MED ORDER — OXYCODONE HCL 5 MG PO TABS
5.0000 mg | ORAL_TABLET | ORAL | Status: DC | PRN
  Administered 2024-01-08: 5 mg via ORAL
  Filled 2024-01-08: qty 2

## 2024-01-08 MED ORDER — BUPIVACAINE-EPINEPHRINE (PF) 0.25% -1:200000 IJ SOLN
INTRAMUSCULAR | Status: AC
  Filled 2024-01-08: qty 30

## 2024-01-08 MED ORDER — DEXAMETHASONE SODIUM PHOSPHATE 10 MG/ML IJ SOLN
10.0000 mg | Freq: Once | INTRAMUSCULAR | Status: AC
  Administered 2024-01-09: 10 mg via INTRAVENOUS
  Filled 2024-01-08: qty 1

## 2024-01-08 SURGICAL SUPPLY — 48 items
ATTUNE MED ANAT PAT 32 KNEE (Knees) IMPLANT
BAG COUNTER SPONGE SURGICOUNT (BAG) IMPLANT
BAG ZIPLOCK 12X15 (MISCELLANEOUS) ×1 IMPLANT
BASEPLATE TIB CMT FB PCKT SZ4 (Stem) IMPLANT
BLADE SAW SGTL 13.0X1.19X90.0M (BLADE) ×1 IMPLANT
BNDG ELASTIC 6INX 5YD STR LF (GAUZE/BANDAGES/DRESSINGS) ×1 IMPLANT
BOWL SMART MIX CTS (DISPOSABLE) ×1 IMPLANT
CEMENT HV SMART SET (Cement) ×2 IMPLANT
COMPONENT FEM CMT ATTN NRS 4LT (Joint) IMPLANT
COOLER ICEMAN CLASSIC (MISCELLANEOUS) IMPLANT
COVER SURGICAL LIGHT HANDLE (MISCELLANEOUS) ×1 IMPLANT
CUFF TRNQT CYL 34X4.125X (TOURNIQUET CUFF) ×1 IMPLANT
DERMABOND ADVANCED .7 DNX12 (GAUZE/BANDAGES/DRESSINGS) ×1 IMPLANT
DRAPE U-SHAPE 47X51 STRL (DRAPES) ×1 IMPLANT
DRESSING AQUACEL AG SP 3.5X10 (GAUZE/BANDAGES/DRESSINGS) ×1 IMPLANT
DURAPREP 26ML APPLICATOR (WOUND CARE) ×2 IMPLANT
ELECT REM PT RETURN 15FT ADLT (MISCELLANEOUS) ×1 IMPLANT
GLOVE BIO SURGEON STRL SZ 6 (GLOVE) ×1 IMPLANT
GLOVE BIOGEL PI IND STRL 6.5 (GLOVE) ×1 IMPLANT
GLOVE BIOGEL PI IND STRL 7.5 (GLOVE) ×1 IMPLANT
GLOVE ORTHO TXT STRL SZ7.5 (GLOVE) ×2 IMPLANT
GLOVE SURG SS PI 6.0 STRL IVOR (GLOVE) IMPLANT
GLOVE SURG SS PI 7.5 STRL IVOR (GLOVE) IMPLANT
GOWN STRL REUS W/ TWL LRG LVL3 (GOWN DISPOSABLE) ×2 IMPLANT
HOLDER FOLEY CATH W/STRAP (MISCELLANEOUS) IMPLANT
INSERT MED ATTUNE KNEE 4X6 LT (Insert) IMPLANT
KIT TURNOVER KIT A (KITS) ×1 IMPLANT
MANIFOLD NEPTUNE II (INSTRUMENTS) ×1 IMPLANT
NDL SAFETY ECLIPSE 18X1.5 (NEEDLE) IMPLANT
NS IRRIG 1000ML POUR BTL (IV SOLUTION) ×1 IMPLANT
PACK TOTAL KNEE CUSTOM (KITS) ×1 IMPLANT
PAD COLD SHLDR WRAP-ON (PAD) IMPLANT
PENCIL SMOKE EVACUATOR (MISCELLANEOUS) ×1 IMPLANT
PIN FIX SIGMA LCS THRD HI (PIN) IMPLANT
PROTECTOR NERVE ULNAR (MISCELLANEOUS) ×1 IMPLANT
SET HNDPC FAN SPRY TIP SCT (DISPOSABLE) ×1 IMPLANT
SET PAD KNEE POSITIONER (MISCELLANEOUS) ×1 IMPLANT
SPIKE FLUID TRANSFER (MISCELLANEOUS) ×2 IMPLANT
SUT MNCRL AB 4-0 PS2 18 (SUTURE) ×1 IMPLANT
SUT STRATAFIX PDS+ 0 24IN (SUTURE) ×1 IMPLANT
SUT VIC AB 1 CT1 36 (SUTURE) ×1 IMPLANT
SUT VIC AB 2-0 CT1 TAPERPNT 27 (SUTURE) ×2 IMPLANT
SYR 3ML LL SCALE MARK (SYRINGE) ×1 IMPLANT
TOWEL GREEN STERILE FF (TOWEL DISPOSABLE) ×1 IMPLANT
TRAY FOLEY MTR SLVR 16FR STAT (SET/KITS/TRAYS/PACK) ×1 IMPLANT
TUBE SUCTION HIGH CAP CLEAR NV (SUCTIONS) ×1 IMPLANT
WATER STERILE IRR 1000ML POUR (IV SOLUTION) ×2 IMPLANT
WRAP KNEE MAXI GEL POST OP (GAUZE/BANDAGES/DRESSINGS) ×1 IMPLANT

## 2024-01-08 NOTE — Anesthesia Procedure Notes (Signed)
 Spinal  Patient location during procedure: OR Start time: 01/08/2024 9:56 AM End time: 01/08/2024 10:01 AM Reason for block: surgical anesthesia Staffing Performed: anesthesiologist  Anesthesiologist: Jacquelyne Matte, DO Performed by: Jacquelyne Matte, DO Authorized by: Jacquelyne Matte, DO   Preanesthetic Checklist Completed: patient identified, IV checked, risks and benefits discussed, surgical consent, monitors and equipment checked, pre-op evaluation and timeout performed Spinal Block Patient position: sitting Prep: DuraPrep and site prepped and draped Patient monitoring: cardiac monitor, continuous pulse ox and blood pressure Approach: midline Location: L3-4 Injection technique: single-shot Needle Needle type: Pencan  Needle gauge: 24 G Needle length: 9 cm Assessment Sensory level: T6 Events: CSF return Additional Notes Functioning IV was confirmed and monitors were applied. Sterile prep and drape, including hand hygiene and sterile gloves were used. The patient was positioned and the spine was prepped. The skin was anesthetized with lidocaine .  Free flow of clear CSF was obtained prior to injecting local anesthetic into the CSF.  The spinal needle aspirated freely following injection.  The needle was carefully withdrawn.  The patient tolerated the procedure well.

## 2024-01-08 NOTE — Op Note (Signed)
 NAME:  Hailey [name]                      MEDICAL RECORD NO.:  [id number]                             FACILITY:  [facility]      PHYSICIAN:  Azalea Lento. Bernard Brick, M.D.  DATE OF BIRTH:  01-13-1976      DATE OF PROCEDURE:  01/08/2024                                     OPERATIVE REPORT         PREOPERATIVE DIAGNOSIS:  Left knee osteoarthritis.      POSTOPERATIVE DIAGNOSIS:  Left knee osteoarthritis.      FINDINGS:  The patient was noted to have complete loss of cartilage and   bone-on-bone arthritis with associated osteophytes in the medial and patellofemoral compartments of   the knee with a significant synovitis and associated effusion.  The patient had failed months of conservative treatment including medications, injection therapy, activity modification.     PROCEDURE:  Left total knee replacement.      COMPONENTS USED:  DePuy Attune FB CR MS knee   system, a size 4N femur, 4 tibia, size 5 mm FB MS AOX insert, and 32 anatomic patellar   button.      SURGEON:  Azalea Lento. Bernard Brick, M.D.      ASSISTANT:  Kim Pen, PA-C.      ANESTHESIA:  Regional and Spinal.      SPECIMENS:  None.      COMPLICATION:  None.      DRAINS:  None.  EBL: <100 cc     TOURNIQUET TIME:  29 min at 225 mmHg     The patient was stable to the recovery room.      INDICATION FOR PROCEDURE:  Hailey [name] is a 48 y.o. female patient of   mine.  The patient had been seen, evaluated, and treated for months conservatively in the   office with medication, activity modification, and injections.  The patient had   radiographic changes of bone-on-bone arthritis with endplate sclerosis and osteophytes noted.  Based on the radiographic changes and failed conservative measures, the patient   decided to proceed with definitive treatment, total knee replacement.  Risks of infection, DVT, component failure, need for revision surgery, neurovascular injury were reviewed in the office setting.  The postop course was reviewed  stressing the efforts to maximize post-operative satisfaction and function.  Consent was obtained for benefit of pain   relief.      PROCEDURE IN DETAIL:  The patient was brought to the operative theater.   Once adequate anesthesia, preoperative antibiotics, 2 gm of Ancef ,1 gm of Tranexamic Acid , and 10 mg of Decadron  administered, the patient was positioned supine with a left thigh tourniquet placed.  The  left lower extremity was prepped and draped in sterile fashion.  A time-   out was performed identifying the patient, planned procedure, and the appropriate extremity.      The left lower extremity was placed in the [name] leg holder.  The leg was   exsanguinated, tourniquet elevated to 225 mmHg.  A midline incision was   made followed by median parapatellar arthrotomy.  Following initial   exposure, attention was first  directed to the patella.  Precut   measurement was noted to be 21 mm.  I resected down to 13 mm and used a   32 anatomic patellar button to restore patellar height as well as cover the cut surface.      The lug holes were drilled and a metal shim was placed to protect the   patella from retractors and saw blade during the procedure.      At this point, attention was now directed to the femur.  The femoral   canal was opened with a drill, irrigated to try to prevent fat emboli.  An   intramedullary rod was passed at 3 degrees valgus, 9 mm of bone was   resected off the distal femur.  Following this resection, the tibia was   subluxated anteriorly.  Using the extramedullary guide, 2 mm of bone was resected off   the proximal medial tibia.  We confirmed the gap would be   stable medially and laterally with a size 5 spacer block as well as confirmed that the tibial cut was perpendicular in the coronal plane, checking with an alignment rod.      Once this was done, I sized the femur to be a size 4 in the anterior-   posterior dimension, chose a narrow component based on  medial and   lateral dimension.  The size 4 rotation block was then pinned in   position anterior referenced using the C-clamp to set rotation.  The   anterior, posterior, and  chamfer cuts were made without difficulty nor   notching making certain that I was along the anterior cortex to help   with flexion gap stability.      The final box cut was made off the lateral aspect of distal femur.      At this point, the tibia was sized to be a size 4.  The size 4 tray was   then pinned in position through the medial third of the tubercle,   drilled, and keel punched.  Trial reduction was now carried with a 4 femur,  4 tibia, a size 5 mm CR MS insert, and the 32 anatomic patella botton.  The knee was brought to full extension with good flexion stability with the patella   tracking through the trochlea without application of pressure.  Given   all these findings the trial components removed.  Final components were   opened and cement was mixed.  The knee was irrigated with normal saline solution and pulse lavage.  The synovial lining was   then injected with 30 cc of 0.25% Marcaine  with epinephrine , 1 cc of Toradol  and 30 cc of NS for a total of 61 cc.     Final implants were then cemented onto cleaned and dried cut surfaces of bone with the knee brought to extension with a size 5 mm CR MS trial insert.      Once the cement had fully cured, excess cement was removed   throughout the knee.  I confirmed that I was satisfied with the range of   motion and stability, and the final size 5 mm CR MS AOX insert was chosen.  It was   placed into the knee.      The tourniquet had been let down at 29 minutes.  No significant   hemostasis was required.  The extensor mechanism was then reapproximated using #1 Vicryl and #1 Stratafix sutures with the knee   in flexion.  The   remaining wound was closed with 2-0 Vicryl and running 4-0 Monocryl.   The knee was cleaned, dried, dressed sterilely using Dermabond  and   Aquacel dressing.  The patient was then   brought to recovery room in stable condition, tolerating the procedure   well.   Please note that Physician Assistant, Kim Pen, PA-C was present for the entirety of the case, and was utilized for pre-operative positioning, peri-operative retractor management, general facilitation of the procedure and for primary wound closure at the end of the case.              Azalea Lento Bernard Brick, M.D.    01/08/2024 8:37 AM

## 2024-01-08 NOTE — Anesthesia Postprocedure Evaluation (Signed)
 Anesthesia Post Note  Patient: Hailey [name]  Procedure(s) Performed: LEFT TOTAL KNEE ARTHROPLASTYL (Left: Knee)     Patient location during evaluation: Phase II Anesthesia Type: Regional, Spinal and MAC Level of consciousness: awake and alert, oriented and patient cooperative Pain management: pain level controlled Vital Signs Assessment: post-procedure vital signs reviewed and stable Respiratory status: spontaneous breathing, nonlabored ventilation and respiratory function stable Cardiovascular status: blood pressure returned to baseline and stable Postop Assessment: no apparent nausea or vomiting, no headache, no backache and spinal receding Anesthetic complications: no   No notable events documented.  Last Vitals:  Vitals:   01/08/24 1400 01/08/24 1500  BP: 115/62 118/70  Pulse: (!) 58   Resp:    Temp:    SpO2: 100% 100%    Last Pain:  Vitals:   01/08/24 1500  TempSrc:   PainSc: 2                  Jacquelyne Matte

## 2024-01-08 NOTE — Discharge Instructions (Signed)

## 2024-01-08 NOTE — Anesthesia Procedure Notes (Signed)
 Anesthesia Regional Block: Adductor canal block   Pre-Anesthetic Checklist: , timeout performed,  Correct Patient, Correct Site, Correct Laterality,  Correct Procedure, Correct Position, site marked,  Risks and benefits discussed,  Surgical consent,  Pre-op evaluation,  At surgeon's request and post-op pain management  Laterality: Left  Prep: Maximum Sterile Barrier Precautions used, chloraprep       Needles:  Injection technique: Single-shot  Needle Type: Echogenic Stimulator Needle     Needle Length: 9cm  Needle Gauge: 22     Additional Needles:   Procedures:,,,, ultrasound used (permanent image in chart),,    Narrative:  Start time: 01/08/2024 9:10 AM End time: 01/08/2024 9:15 AM Injection made incrementally with aspirations every 5 mL.  Performed by: Personally  Anesthesiologist: Jacquelyne Matte, DO  Additional Notes: Monitors applied. No increased pain on injection. No increased resistance to injection. Injection made in 5cc increments. Good needle visualization. Patient tolerated procedure well.

## 2024-01-08 NOTE — Evaluation (Signed)
 Physical Therapy Evaluation Patient Details Name: Hailey [name] MRN: [id number] DOB: Feb 05, 1976 Today's Date: 01/08/2024  History of Present Illness  Pt is 48 yo female admitted on 01/08/24 for L TKA. Pt with hx including but not limited to dyslipidemia, lymphadenopathy, OA, ankylosing spondylitis, polyarthralgia, GERD  Clinical Impression  Pt is s/p TKA resulting in the deficits listed below (see PT Problem List). PT with orders to see pt for possible SDDC.  At baseline, pt independent and working.  She had family support, accessible home, and DME.  Today, pt limited due to spinal anesthesia.  She initially reports numbness in buttock and genitals but able to SLR and gluteal squeeze.  Attempted standing , but once standing pt with heels and posterior legs feeling numb and was unsteady and not able to proceed.  Therapy session ~ 4.5 hours after surgery.  She also had some lightheadedness and nausea with sitting and standing - BP was 116/58.  Pt tearful - was hoping to return home. Did offer to return in about an hour but after discussion with pt and spouse she felt safer staying the night in hospital as she had had no changes in sensation for the last 1-2 hours.  PT agrees - with numbness, lightheadedness, and nausea -not safe for SDDC. Pt will benefit from acute skilled PT to increase their independence and safety with mobility to allow discharge.          If plan is discharge home, recommend the following: A little help with walking and/or transfers;A little help with bathing/dressing/bathroom;Help with stairs or ramp for entrance   Can travel by private vehicle        Equipment Recommendations None recommended by PT  Recommendations for Other Services       Functional Status Assessment Patient has had a recent decline in their functional status and demonstrates the ability to make significant improvements in function in a reasonable and predictable amount of time.     Precautions /  Restrictions Precautions Precautions: Fall;Knee Restrictions Weight Bearing Restrictions Per Provider Order: Yes LLE Weight Bearing Per Provider Order: Weight bearing as tolerated      Mobility  Bed Mobility Overal bed mobility: Needs Assistance Bed Mobility: Supine to Sit, Sit to Supine     Supine to sit: Contact guard Sit to supine: Contact guard assist        Transfers Overall transfer level: Needs assistance Equipment used: Rolling walker (2 wheels) Transfers: Sit to/from Stand Sit to Stand: Min assist           General transfer comment: Light min A to steady.  Upon standing pt felt that bil heels were numb and felt very unsteady and lightheaded so returned to sitting.  BP stable and pt wanting to try again.  Similar to first stand - felt unsteady and not safe to proceed.    Ambulation/Gait                  Stairs            Wheelchair Mobility     Tilt Bed    Modified Rankin (Stroke Patients Only)       Balance Overall balance assessment: Needs assistance Sitting-balance support: No upper extremity supported Sitting balance-Leahy Scale: Fair     Standing balance support: Bilateral upper extremity supported Standing balance-Leahy Scale: Poor Standing balance comment: RW and CGA/min A  Pertinent Vitals/Pain Pain Assessment Pain Assessment: 0-10 Pain Score: 1  Pain Descriptors / Indicators: Discomfort Pain Intervention(s): Limited activity within patient's tolerance, Monitored during session, Repositioned, Ice applied    Home Living Family/patient expects to be discharged to:: Private residence Living Arrangements: Spouse/significant other;Children Available Help at Discharge: Family;Available 24 hours/day Type of Home: House Home Access: Level entry       Home Layout: One level Home Equipment: Grab bars - tub/shower;Shower seat - built [employer] fundraiser (2 wheels);Jeananne Mighty - single  point Additional Comments: Reports home handicapped accessible    Prior Function Prior Level of Function : Independent/Modified Independent;Working/employed             Mobility Comments: could ambulate in community 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     Extremity/Trunk Assessment   Upper Extremity Assessment Upper Extremity Assessment: Overall WFL for tasks assessed    Lower Extremity Assessment Lower Extremity Assessment: LLE deficits/detail;RLE deficits/detail RLE Deficits / Details: ROM WFL; MMT: ankle 5/5, knee ext 5/5, hip flexion 5/5 and able to gluteal squeeze RLE Sensation: decreased light touch (initially reports buttock and genitals still numb but upon standing also reports heel and posterior thigh) LLE Deficits / Details: ROM WFL; MMT: ankle 5/5, knee and hip 3/5 not further tested; able to gluteal squeeze and SLR without extensor lag LLE Sensation: decreased light touch (initially reports buttock and genitals still numb but upon standing also reports heel and posterior thigh)    Cervical / Trunk Assessment Cervical / Trunk Assessment: Normal  Communication        Cognition Arousal: Alert Behavior During Therapy: WFL for tasks assessed/performed   PT - Cognitive impairments: No apparent impairments                                 Cueing       General Comments General comments (skin integrity, edema, etc.): Encouraged ankle pumps, heel slides, and quad sets tonight    Exercises     Assessment/Plan    PT Assessment Patient needs continued PT services  PT Problem List Decreased strength;Pain;Decreased range of motion;Decreased activity tolerance;Decreased knowledge of use of DME;Decreased balance;Decreased mobility       PT Treatment Interventions DME instruction;Therapeutic exercise;Gait training;Balance training;Stair training;Functional mobility training;Therapeutic activities;Patient/family education;Modalities    PT  Goals (Current goals can be found in the Care Plan section)  Acute Rehab PT Goals Patient Stated Goal: return home PT Goal Formulation: With patient/family Time For Goal Achievement: 01/22/24 Potential to Achieve Goals: Good    Frequency 7X/week     Co-evaluation               AM-PAC PT "6 Clicks" Mobility  Outcome Measure Help needed turning from your back to your side while in a flat bed without using bedrails?: A Little Help needed moving from lying on your back to sitting on the side of a flat bed without using bedrails?: A Little Help needed moving to and from a bed to a chair (including a wheelchair)?: A Little Help needed standing up from a chair using your arms (e.g., wheelchair or bedside chair)?: A Little Help needed to walk in hospital room?: A Lot Help needed climbing 3-5 steps with a railing? : A Lot 6 Click Score: 16    End of Session Equipment Utilized During Treatment: Gait belt Activity Tolerance: Treatment limited secondary to medical complications (Comment) Patient left: in bed;with call bell/phone  within reach;with family/visitor present (in PACU) Nurse Communication: Mobility status PT Visit Diagnosis: Other abnormalities of gait and mobility (R26.89);Muscle weakness (generalized) (M62.81)    Time: 7846-9629 PT Time Calculation (min) (ACUTE ONLY): 23 min   Charges:   PT Evaluation $PT Eval Low Complexity: 1 Low PT Treatments $Therapeutic Activity: 8-22 mins PT General Charges $$ ACUTE PT VISIT: 1 Visit         Cyd Dowse, PT Acute Rehab [facility] [phone]   Carolynn Citrin 01/08/2024, 5:47 PM

## 2024-01-08 NOTE — H&P (Signed)
 TOTAL KNEE ADMISSION H&P  Patient is being admitted for left total knee arthroplasty.  Therapy Plans: outpatient therapy at [facility] Disposition: Home with husband Planned DVT Prophylaxis: aspirin  81mg  BID DME needed: walker & ice machine PCP: Janna Melter - clearance received Cardio: Dr. 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  HPI: Hailey [name], 48 y.o. female, has a history of pain and functional disability in the left knee due to arthritis and has failed non-surgical conservative treatments for greater than 12 weeks to includeNSAID's and/or analgesics, corticosteriod injections, and viscosupplementation injections.  Onset of symptoms was gradual, starting 2 years ago with gradually worsening course since that time. The patient noted no past surgery on the left knee(s).  Patient currently rates pain in the left knee(s) at 8 out of 10 with activity. Patient has worsening of pain with activity and weight bearing and pain that interferes with activities of daily living.  Patient has evidence of joint space narrowing by imaging studies.  There is no active infection.  Patient Active Problem List   Diagnosis Date Noted   Atypical chest pain 11/23/2023   Preop cardiovascular exam 11/23/2023   Dyslipidemia 11/23/2023   Generalized lymphadenopathy 09/23/2021   B12 deficiency 09/23/2021   Fatigue 09/23/2021   Primary osteoarthritis of both knees  04/29/2021   Ankylosing spondylitis (HCC) 12/30/2018   Asthma in adult without complication 12/30/2018   Chronic diarrhea 12/30/2018   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   Interstitial cystitis 07/03/2016   Pelvic pain in female 07/03/2016   Chondromalacia of knee, 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   BMI 38.0-38.9,adult 02/14/2016   Polyarthralgia 02/14/2016   GERD (gastroesophageal reflux disease) 02/10/2014   Past Medical History:  Diagnosis Date   Allergic state    Allergy    Ankylosing spondylitis (HCC)    followed by rheumatology Dr. Lydia Sams   Arthritis    Asthma    Cervical disc disease    Depression    Enlarged lymph nodes    GERD (gastroesophageal reflux disease)    H/O colonoscopy    HPV (human papilloma virus) infection    Hypothyroidism    Interstitial cystitis    LGSIL (low grade squamous intraepithelial dysplasia)    Paresthesia    Status post laparoscopic cholecystectomy 07/13/2016    Past Surgical History:  Procedure Laterality Date   BREAST BIOPSY Right 10/18/2015   bx/clip- neg   CHOLECYSTECTOMY N/A 07/07/2016   Procedure: LAPAROSCOPIC CHOLECYSTECTOMY;  Surgeon: Emmalene Hare, MD;  Location: [facility] ORS;  Service: General;  Laterality: N/A;   COLONOSCOPY WITH PROPOFOL  N/A 10/11/2015   Procedure: COLONOSCOPY WITH PROPOFOL ;  Surgeon: Deveron Fly, MD;  Location: [facility] ENDOSCOPY;  Service: Endoscopy;  Laterality: N/A;   ESOPHAGOGASTRODUODENOSCOPY (EGD) WITH PROPOFOL  N/A 10/11/2015   Procedure: ESOPHAGOGASTRODUODENOSCOPY (EGD) WITH PROPOFOL ;  Surgeon: Deveron Fly, MD;  Location: [facility] ENDOSCOPY;  Service: Endoscopy;  Laterality: N/A;   TONSILLECTOMY     TOTAL THYROIDECTOMY  2005   goiter  No current facility-administered medications for this encounter.   Current Outpatient Medications  Medication Sig Dispense Refill Last Dose/Taking   albuterol  (VENTOLIN  HFA) 108 (90 Base) MCG/ACT inhaler Inhale 2 puffs into the lungs every 6 (six) hours as needed for wheezing or  shortness of breath. 6.7 g 0 Taking As Needed   celecoxib  (CELEBREX ) 100 MG capsule Take 1 capsule (100 mg total) by mouth 2 (two) times daily. (Patient taking differently: Take 100 mg by mouth 2 (two) times daily as needed for mild pain (pain score 1-3) or moderate pain (pain score 4-6).) 60 capsule 3 Taking Differently   fluticasone -salmeterol (ADVAIR  DISKUS) 100-50 MCG/ACT AEPB Inhale 1 puff into the lungs 2 (two) times daily as needed. (Patient taking differently: Inhale 1 puff into the lungs daily as needed (SOB).) 60 each 0 Taking Differently   SYNTHROID  200 MCG tablet Take 1 tablet (200 mcg total) by mouth once daily. Take on an empty stomach with a glass of water at least 30-60 minutes before breakfast. 90 tablet 3 Taking   aspirin  81 MG chewable tablet Chew 1 tablet (81 mg total) by mouth 2 (two) times daily. (Patient not taking: Reported on 12/31/2023) 60 tablet 0 Not Taking   celecoxib  (CELEBREX ) 200 MG capsule Take 1 capsule (200 mg total) by mouth 2 (two) times daily with a meal. (Patient not taking: Reported on 12/31/2023) 60 capsule 0 Not Taking   methocarbamol  (ROBAXIN ) 500 MG tablet Take 1 tablet (500 mg total) by mouth every 6 (six) hours as needed. (Patient not taking: Reported on 12/31/2023) 40 tablet 2 Not Taking   ondansetron  (ZOFRAN -ODT) 4 MG disintegrating tablet Take 1 tablet (4 mg total) by mouth every 6 (six) hours as needed. (Patient not taking: Reported on 12/31/2023) 10 tablet 0 Not Taking   oxyCODONE  (OXY IR/ROXICODONE ) 5 MG immediate release tablet Take 1 tablet (5 mg total) by mouth every 4 (four) hours as needed. (Patient not taking: Reported on 12/31/2023) 42 tablet 0 Not Taking   polyethylene glycol powder (GLYCOLAX /MIRALAX ) 17 GM/SCOOP powder Take 17 g by mouth daily. (Patient not taking: Reported on 12/31/2023) 238 g 0 Not Taking   senna (SENOKOT) 8.6 MG tablet Take 2 tablets (17.2 mg total) by mouth at bedtime. (Patient not taking: Reported on 12/31/2023) 28 tablet 0 Not  Taking   tranexamic acid  (LYSTEDA ) 650 MG TABS tablet Take 3 tablets (1,950 mg total) by mouth daily. (Patient not taking: Reported on 12/31/2023) 9 tablet 0 Not Taking   Allergies  Allergen Reactions   Levofloxacin Rash and Dermatitis   Lodine [Etodolac] Shortness Of Breath   Ciprofloxacin Hives   Latex Rash    Social History   Tobacco Use   Smoking status: Former    Current packs/day: 0.00    Average packs/day: 0.5 packs/day for 7.0 years (3.5 ttl pk-yrs)    Types: Cigarettes    Start date: 22    Quit date: 2003    Years since quitting: 22.3    Passive exposure: Never   Smokeless tobacco: Never  Substance Use Topics   Alcohol use: Yes    Comment: Occ.    Family History  Problem Relation Age of Onset   Cancer Mother        Vulva cancer   Hypertension Mother    Stroke Mother    Goiter Mother    Diabetes Father    Hypertension Father    Hypertension Brother    Bladder Cancer Maternal Aunt    Hypertension Maternal Grandmother  Stroke Maternal Grandmother    Diabetes Maternal Grandmother    Breast cancer Cousin 29       pat cousin     Review of Systems  Constitutional:  Negative for chills and fever.  Respiratory:  Negative for cough and shortness of breath.   Cardiovascular:  Negative for chest pain.  Gastrointestinal:  Negative for nausea and vomiting.  Musculoskeletal:  Positive for arthralgias.     Objective:  Physical Exam Well nourished and well developed. General: Alert and oriented x3, cooperative and pleasant, no acute distress. Head: normocephalic, atraumatic, neck supple.  Musculoskeletal: Bilateral knee exams: No palpable effusions, warmth erythema Bilateral genu varum Slight flexion contracture with flexion of 110 degrees Tenderness mainly over the anterior medial knees No lower extremity edema, erythema or calf tenderness   Calves soft and nontender. Motor function intact in LE. Strength 5/5 LE bilaterally. Neuro: Distal pulses 2+.  Sensation to light touch intact in LE.  Vital signs in last 24 hours:    Labs:   Estimated body mass index is 38.77 kg/m as calculated from the following:   Height as of 12/31/23: 5\' 5"  (1.651 m).   Weight as of 12/31/23: 105.7 kg.   Imaging Review Plain radiographs demonstrate severe degenerative joint disease of the left knee(s). The bone quality appears to be adequate for age and reported activity level.      Assessment/Plan:  End stage arthritis, left knee   The patient history, physical examination, clinical judgment of the provider and imaging studies are consistent with end stage degenerative joint disease of the left knee(s) and total knee arthroplasty is deemed medically necessary. The treatment options including medical management, injection therapy arthroscopy and arthroplasty were discussed at length. The risks and benefits of total knee arthroplasty were presented and reviewed. The risks due to aseptic loosening, infection, stiffness, patella tracking problems, thromboembolic complications and other imponderables were discussed. The patient acknowledged the explanation, agreed to proceed with the plan and consent was signed. Patient is being admitted for inpatient treatment for surgery, pain control, PT, OT, prophylactic antibiotics, VTE prophylaxis, progressive ambulation and ADL's and discharge planning. The patient is planning to be discharged  home.    Kim Pen, PA-C Orthopedic Surgery [facility] [place] [phone]

## 2024-01-08 NOTE — Transfer of Care (Signed)
 Immediate Anesthesia Transfer of Care Note  Patient: Hailey [name]  Procedure(s) Performed: LEFT TOTAL KNEE ARTHROPLASTYL (Left: Knee)  Patient Location: PACU  Anesthesia Type:Spinal  Level of Consciousness: awake, alert , oriented, and patient cooperative  Airway & Oxygen Therapy: Patient Spontanous Breathing and Patient connected to face mask oxygen  Post-op Assessment: Report given to RN and Post -op Vital signs reviewed and stable  Post vital signs: Reviewed and stable  Last Vitals:  Vitals Value Taken Time  BP 105/68 01/08/24 1145  Temp    Pulse 73 01/08/24 1147  Resp 13 01/08/24 1147  SpO2 100 % 01/08/24 1147  Vitals shown include unfiled device data.  Last Pain:  Vitals:   01/08/24 0918  TempSrc:   PainSc: 1          Complications: No notable events documented.

## 2024-01-08 NOTE — Interval H&P Note (Signed)
 History and Physical Interval Note:  01/08/2024 8:37 AM  Hailey [name]  has presented today for surgery, with the diagnosis of Left knee osteoarthritis.  The various methods of treatment have been discussed with the patient and family. After consideration of risks, benefits and other options for treatment, the patient has consented to  Procedure(s): ARTHROPLASTY, KNEE, TOTAL (Left) as a surgical intervention.  The patient's history has been reviewed, patient examined, no change in status, stable for surgery.  I have reviewed the patient's chart and labs.  Questions were answered to the patient's satisfaction.     Bevin Bucks

## 2024-01-09 ENCOUNTER — Encounter (HOSPITAL_COMMUNITY): Payer: Self-pay | Admitting: Orthopedic Surgery

## 2024-01-09 ENCOUNTER — Other Ambulatory Visit: Payer: Self-pay

## 2024-01-09 DIAGNOSIS — E039 Hypothyroidism, unspecified: Secondary | ICD-10-CM | POA: Diagnosis not present

## 2024-01-09 DIAGNOSIS — Z79899 Other long term (current) drug therapy: Secondary | ICD-10-CM | POA: Diagnosis not present

## 2024-01-09 DIAGNOSIS — J45909 Unspecified asthma, uncomplicated: Secondary | ICD-10-CM | POA: Diagnosis not present

## 2024-01-09 DIAGNOSIS — Z7982 Long term (current) use of aspirin: Secondary | ICD-10-CM | POA: Diagnosis not present

## 2024-01-09 DIAGNOSIS — Z87891 Personal history of nicotine dependence: Secondary | ICD-10-CM | POA: Diagnosis not present

## 2024-01-09 DIAGNOSIS — Z91040 Latex allergy status: Secondary | ICD-10-CM | POA: Diagnosis not present

## 2024-01-09 DIAGNOSIS — M1712 Unilateral primary osteoarthritis, left knee: Secondary | ICD-10-CM | POA: Diagnosis not present

## 2024-01-09 LAB — BASIC METABOLIC PANEL WITH GFR
Anion gap: 10 (ref 5–15)
BUN: 13 mg/dL (ref 6–20)
CO2: 21 mmol/L — ABNORMAL LOW (ref 22–32)
Calcium: 9.1 mg/dL (ref 8.9–10.3)
Chloride: 104 mmol/L (ref 98–111)
Creatinine, Ser: 0.68 mg/dL (ref 0.44–1.00)
GFR, Estimated: >60 mL/min (ref >60)
Glucose, Bld: 238 mg/dL — ABNORMAL HIGH (ref 70–99)
Potassium: 4.2 mmol/L (ref 3.5–5.1)
Sodium: 135 mmol/L (ref 135–145)

## 2024-01-09 LAB — CBC
HCT: 33.3 % — ABNORMAL LOW (ref 36.0–46.0)
Hemoglobin: 11.1 g/dL — ABNORMAL LOW (ref 12.0–15.0)
MCH: 29.1 pg (ref 26.0–34.0)
MCHC: 33.3 g/dL (ref 30.0–36.0)
MCV: 87.2 fL (ref 80.0–100.0)
Platelets: 395 10*3/uL (ref 150–400)
RBC: 3.82 MIL/uL — ABNORMAL LOW (ref 3.87–5.11)
RDW: 13.5 % (ref 11.5–15.5)
WBC: 20.9 10*3/uL — ABNORMAL HIGH (ref 4.0–10.5)
nRBC: 0 % (ref 0.0–0.2)

## 2024-01-09 NOTE — Progress Notes (Signed)
 Subjective: 1 Day Post-Op Procedure(s) (LRB): LEFT TOTAL KNEE ARTHROPLASTYL (Left) Patient reports pain as mild.   Patient seen in rounds with Dr. Bernard Brick. Patient is well, and has had no acute complaints or problems. No acute events overnight. Foley catheter removed. Patient was limited with PT yesterday due to residual spinal effects, so did not discharge home SD as planned. We will start therapy today.   Objective: Vital signs in last 24 hours: Temp:  [97.4 F (36.3 C)-98.6 F (37 C)] 97.6 F (36.4 C) (04/30 0659) Pulse Rate:  [58-94] 65 (04/30 0659) Resp:  [11-22] 16 (04/30 0659) BP: (101-158)/(50-91) 127/74 (04/30 0659) SpO2:  [95 %-100 %] 100 % (04/30 0659) Weight:  [105.7 kg] 105.7 kg (04/29 2056)  Intake/Output from previous day:  Intake/Output Summary (Last 24 hours) at 01/09/2024 0828 Last data filed at 01/09/2024 0600 Gross per 24 hour  Intake 2530 ml  Output 1380 ml  Net 1150 ml     Intake/Output this shift: No intake/output data recorded.  Labs: Recent Labs    01/09/24 0327  HGB 11.1*   Recent Labs    01/09/24 0327  WBC 20.9*  RBC 3.82*  HCT 33.3*  PLT 395   Recent Labs    01/09/24 0327  NA 135  K 4.2  CL 104  CO2 21*  BUN 13  CREATININE 0.68  GLUCOSE 238*  CALCIUM 9.1   No results for input(s): "LABPT", "INR" in the last 72 hours.  Exam: General - Patient is Alert and Oriented Extremity - Neurologically intact Sensation intact distally Intact pulses distally Dorsiflexion/Plantar flexion intact Dressing - dressing C/D/I Motor Function - intact, moving foot and toes well on exam.   Past Medical History:  Diagnosis Date   Allergic state    Allergy    Ankylosing spondylitis (HCC)    followed by rheumatology Dr. Lydia Sams   Arthritis    Asthma    Cervical disc disease    Depression    Enlarged lymph nodes    GERD (gastroesophageal reflux disease)    H/O colonoscopy    HPV (human papilloma virus) infection    Hypothyroidism     Interstitial cystitis    LGSIL (low grade squamous intraepithelial dysplasia)    Paresthesia    Status post laparoscopic cholecystectomy 07/13/2016    Assessment/Plan: 1 Day Post-Op Procedure(s) (LRB): LEFT TOTAL KNEE ARTHROPLASTYL (Left) Principal Problem:   S/P total knee arthroplasty Active Problems:   S/P total knee arthroplasty, left  Estimated body mass index is 38.78 kg/m as calculated from the following:   Height as of this encounter: 5\' 5"  (1.651 m).   Weight as of this encounter: 105.7 kg. Advance diet Up with therapy D/C IV fluids   Patient's anticipated LOS is less than 2 midnights, meeting these requirements: - Younger than 74 - Lives within 1 hour of care - Has a competent adult at home to recover with post-op recover - NO history of  - Chronic pain requiring opiods  - Diabetes  - Coronary Artery Disease  - Heart failure  - Heart attack  - Stroke  - DVT/VTE  - Cardiac arrhythmia  - Respiratory Failure/COPD  - Renal failure  - Anemia  - Advanced Liver disease     DVT Prophylaxis - Aspirin  Weight bearing as tolerated.  Hgb stable at 11.1 this AM.  All post op meds sent prior to surgery   Plan is to go Home after hospital stay. Plan for discharge today following 1-2 sessions of  PT as long as they are meeting their goals. Patient is scheduled for OPPT. Follow up in the office in 2 weeks.   Kim Pen, PA-C Orthopedic Surgery [phone] 01/09/2024, 8:28 AM

## 2024-01-09 NOTE — Progress Notes (Signed)
 Physical Therapy Treatment Patient Details Name: Hailey [name] MRN: [id number] DOB: 1976-04-21 Today's Date: 01/09/2024   History of Present Illness Pt is 48 yo female admitted on 01/08/24 for L TKA. Pt with hx including but not limited to dyslipidemia, lymphadenopathy, OA, ankylosing spondylitis, polyarthralgia, GERD    PT Comments  Pt is POD # 1 and is progressing well.  Pt feeling much better today and residual effects of spinal are no longer present.  She has excellent pain control and has only taken Tylenol .  Pt with good ROM and quad activation.  She ambulated 200' with RW safely and near normal gait pattern.  Pt expected to continue to progress very well with outpt PT. Pt demonstrates safe gait & transfers in order to return home from PT perspective once discharged by MD.  While in hospital, will continue to benefit from PT for skilled therapy to advance mobility and exercises.       If plan is discharge home, recommend the following: A little help with walking and/or transfers;A little help with bathing/dressing/bathroom;Help with stairs or ramp for entrance   Can travel by private vehicle        Equipment Recommendations  None recommended by PT    Recommendations for Other Services       Precautions / Restrictions Precautions Precautions: Fall;Knee Restrictions LLE Weight Bearing Per Provider Order: Weight bearing as tolerated     Mobility  Bed Mobility Overal bed mobility: Needs Assistance Bed Mobility: Supine to Sit, Sit to Supine     Supine to sit: Modified independent (Device/Increase time) Sit to supine: Modified independent (Device/Increase time)        Transfers Overall transfer level: Needs assistance Equipment used: Rolling walker (2 wheels) Transfers: Sit to/from Stand Sit to Stand: Min assist           General transfer comment: cues for hand placement and L LE management; only light min A to rise    Ambulation/Gait Ambulation/Gait assistance:  Contact guard assist, Supervision Gait Distance (Feet): 200 Feet Assistive device: Rolling walker (2 wheels) Gait Pattern/deviations: Step-through pattern Gait velocity: decreased but functional     General Gait Details: CGA progressed to supervision; near normal pattern with only slight decrease in L weight shift   Stairs Stairs:  (no stairs)           Wheelchair Mobility     Tilt Bed    Modified Rankin (Stroke Patients Only)       Balance Overall balance assessment: Needs assistance Sitting-balance support: No upper extremity supported Sitting balance-Leahy Scale: Good     Standing balance support: Bilateral upper extremity supported, No upper extremity supported Standing balance-Leahy Scale: Fair Standing balance comment: RW to ambulate but could stand without UE support                            Communication    Cognition Arousal: Alert Behavior During Therapy: WFL for tasks assessed/performed   PT - Cognitive impairments: No apparent impairments                                Cueing    Exercises Total Joint Exercises Ankle Circles/Pumps: AROM, Both, 10 reps, Supine Quad Sets: AROM, Both, 10 reps, Supine Heel Slides: AROM, Left, 10 reps, Supine Hip ABduction/ADduction: AROM, Left, 10 reps, Supine Long Arc Quad: AROM, Left, 10 reps, Seated Knee Flexion: AROM,  Left, 10 reps, Seated Goniometric ROM: L knee 0 to 70 degrees Other Exercises Other Exercises: educated on AAROM as needed    General Comments  Educated on safe ice use, no pivots, car transfers, resting with leg straight, and TED hose during day. Also, encouraged walking every 1-2 hours during day. Educated on HEP with focus on mobility the first weeks. Discussed doing exercises within pain control and if pain increasing could decreased ROM, reps, and stop exercises as needed. Encouraged to perform quad sets and ankle pumps frequently for blood flow and to promote full  knee extension.       Pertinent Vitals/Pain Pain Assessment Pain Assessment: 0-10 Pain Score: 2  Pain Location: L Knee Pain Descriptors / Indicators: Sore Pain Intervention(s): Limited activity within patient's tolerance, Premedicated before session, Monitored during session, Repositioned, Ice applied (only wanted Tylenol )    Home Living                          Prior Function            PT Goals (current goals can now be found in the care plan section) Progress towards PT goals: Progressing toward goals    Frequency    7X/week      PT Plan      Co-evaluation              AM-PAC PT "6 Clicks" Mobility   Outcome Measure  Help needed turning from your back to your side while in a flat bed without using bedrails?: A Little Help needed moving from lying on your back to sitting on the side of a flat bed without using bedrails?: A Little Help needed moving to and from a bed to a chair (including a wheelchair)?: A Little Help needed standing up from a chair using your arms (e.g., wheelchair or bedside chair)?: A Little Help needed to walk in hospital room?: A Little Help needed climbing 3-5 steps with a railing? : A Little 6 Click Score: 18    End of Session Equipment Utilized During Treatment: Gait belt Activity Tolerance: Patient tolerated treatment well Patient left: with call bell/phone within reach;with family/visitor present;in chair Nurse Communication: Mobility status PT Visit Diagnosis: Other abnormalities of gait and mobility (R26.89);Muscle weakness (generalized) (M62.81)     Time: 4098-1191 PT Time Calculation (min) (ACUTE ONLY): 39 min  Charges:    $Gait Training: 8-22 mins $Therapeutic Exercise: 8-22 mins $Therapeutic Activity: 8-22 mins PT General Charges $$ ACUTE PT VISIT: 1 Visit                     Cyd Dowse, PT Acute Rehab [facility] [phone]    Carolynn Citrin 01/09/2024, 11:37 AM

## 2024-01-09 NOTE — Plan of Care (Signed)
  Problem: Education: Goal: Knowledge of General Education information will improve Description: Including pain rating scale, medication(s)/side effects and non-pharmacologic comfort measures Outcome: Adequate for Discharge   Problem: Health Behavior/Discharge Planning: Goal: Ability to manage health-related needs will improve Outcome: Adequate for Discharge   Problem: Clinical Measurements: Goal: Ability to maintain clinical measurements within normal limits will improve Outcome: Progressing Goal: Will remain free from infection Outcome: Progressing Goal: Diagnostic test results will improve Outcome: Progressing Goal: Respiratory complications will improve Outcome: Progressing Goal: Cardiovascular complication will be avoided Outcome: Progressing   Problem: Activity: Goal: Risk for activity intolerance will decrease Outcome: Progressing   Problem: Nutrition: Goal: Adequate nutrition will be maintained Outcome: Completed/Met   Problem: Coping: Goal: Level of anxiety will decrease Outcome: Progressing   Problem: Elimination: Goal: Will not experience complications related to bowel motility Outcome: Progressing Goal: Will not experience complications related to urinary retention Outcome: Completed/Met   Problem: Pain Managment: Goal: General experience of comfort will improve and/or be controlled Outcome: Progressing   Problem: Safety: Goal: Ability to remain free from injury will improve Outcome: Progressing   Problem: Skin Integrity: Goal: Risk for impaired skin integrity will decrease Outcome: Adequate for Discharge   Problem: Education: Goal: Knowledge of the prescribed therapeutic regimen will improve Outcome: Progressing Goal: Individualized Educational Video(s) Outcome: Completed/Met   Problem: Activity: Goal: Ability to avoid complications of mobility impairment will improve Outcome: Progressing Goal: Range of joint motion will improve Outcome:  Adequate for Discharge   Problem: Clinical Measurements: Goal: Postoperative complications will be avoided or minimized Outcome: Progressing   Problem: Pain Management: Goal: Pain level will decrease with appropriate interventions Outcome: Adequate for Discharge   Problem: Skin Integrity: Goal: Will show signs of wound healing Outcome: Progressing

## 2024-01-09 NOTE — TOC Transition Note (Signed)
 Transition of Care [facility]) - Discharge Note   Patient Details  Name: Hailey [name] MRN: [id number] Date of Birth: August 29, 1976  [facility] CM/SW Contact:  Bari Leys, RN Phone Number: 01/09/2024, 10:20 AM   Clinical Narrative:  Met with patient at bedside to review dc therapy and home DME needs, pt confirmed OPPT [place], confirmed has a RW. No TOC needs.      Final next level of care: OP Rehab Barriers to Discharge: No Barriers Identified   Patient Goals and CMS Choice Patient states their goals for this hospitalization and ongoing recovery are:: return home          Discharge Placement                       Discharge Plan and Services Additional resources added to the After Visit Summary for                                       Social Drivers of Health (SDOH) Interventions SDOH Screenings   Food Insecurity: No Food Insecurity (01/08/2024)  Housing: Low Risk  (01/08/2024)  Transportation Needs: No Transportation Needs (01/08/2024)  Utilities: Not At Risk (01/08/2024)  Depression (PHQ2-9): Medium Risk (04/04/2022)  Tobacco Use: Medium Risk (01/08/2024)     Readmission Risk Interventions     No data to display

## 2024-01-10 ENCOUNTER — Ambulatory Visit: Admitting: Physical Therapy

## 2024-01-14 NOTE — Therapy (Unsigned)
 OUTPATIENT PHYSICAL THERAPY KNEE POST-OP EVALUATION  Patient Name: Hailey [name] MRN: [id number] DOB:April 12, 1976, 48 y.o., female Today's Date: 01/15/2024  END OF SESSION:  PT End of Session - 01/15/24 1516     Visit Number 1    Number of Visits 17    Date for PT Re-Evaluation 03/11/24    PT Start Time 1517    PT Stop Time 1559    PT Time Calculation (min) 42 min    Activity Tolerance Patient tolerated treatment well    Behavior During Therapy [facility] for tasks assessed/performed             Past Medical History:  Diagnosis Date   Allergic state    Allergy    Ankylosing spondylitis (HCC)    followed by rheumatology Dr. Lydia Sams   Arthritis    Asthma    Cervical disc disease    Depression    Enlarged lymph nodes    GERD (gastroesophageal reflux disease)    H/O colonoscopy    HPV (human papilloma virus) infection    Hypothyroidism    Interstitial cystitis    LGSIL (low grade squamous intraepithelial dysplasia)    Paresthesia    Status post laparoscopic cholecystectomy 07/13/2016   Past Surgical History:  Procedure Laterality Date   BREAST BIOPSY Right 10/18/2015   bx/clip- neg   CHOLECYSTECTOMY N/A 07/07/2016   Procedure: LAPAROSCOPIC CHOLECYSTECTOMY;  Surgeon: Emmalene Hare, MD;  Location: [facility] ORS;  Service: General;  Laterality: N/A;   COLONOSCOPY WITH PROPOFOL  N/A 10/11/2015   Procedure: COLONOSCOPY WITH PROPOFOL ;  Surgeon: Deveron Fly, MD;  Location: [facility] ENDOSCOPY;  Service: Endoscopy;  Laterality: N/A;   ESOPHAGOGASTRODUODENOSCOPY (EGD) WITH PROPOFOL  N/A 10/11/2015   Procedure: ESOPHAGOGASTRODUODENOSCOPY (EGD) WITH PROPOFOL ;  Surgeon: Deveron Fly, MD;  Location: [facility] ENDOSCOPY;  Service: Endoscopy;  Laterality: N/A;   TONSILLECTOMY     TOTAL KNEE ARTHROPLASTY Left 01/08/2024   Procedure: LEFT TOTAL KNEE ARTHROPLASTYL;  Surgeon: Claiborne Crew, MD;  Location: [facility] ORS;  Service: Orthopedics;  Laterality: Left;   TOTAL THYROIDECTOMY  2005   goiter   Patient  Active Problem List   Diagnosis Date Noted   S/P total knee arthroplasty, left 01/08/2024   S/P total knee arthroplasty 01/08/2024   Atypical chest pain 11/23/2023   Preop cardiovascular exam 11/23/2023   Dyslipidemia 11/23/2023   Generalized lymphadenopathy 09/23/2021   B12 deficiency 09/23/2021   Fatigue 09/23/2021   Primary osteoarthritis of both knees 04/29/2021   Ankylosing spondylitis (HCC) 12/30/2018   Asthma in adult without complication 12/30/2018   Chronic diarrhea 12/30/2018   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   Interstitial cystitis 07/03/2016   Pelvic pain in female 07/03/2016   Chondromalacia of knee, 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   BMI 38.0-38.9,adult 02/14/2016   Polyarthralgia 02/14/2016   GERD (gastroesophageal reflux disease) 02/10/2014    PCP: Sheron Dixons, MD  REFERRING PROVIDER: Earnie Gola, PA-C  REFERRING DIAG: J19.14 (ICD-10-CM) - Unilateral primary osteoarthritis, left knee   RATIONALE FOR EVALUATION AND TREATMENT: Rehabilitation  THERAPY DIAG: Acute pain of left knee  Stiffness of left knee, not elsewhere classified  Difficulty in walking, not elsewhere classified  Muscle weakness (generalized)  ONSET DATE: 01/08/24 (DOS, L TKA)  FOLLOW-UP APPT SCHEDULED WITH REFERRING PROVIDER: Yes ; next f/u in 1 week for post-op   SUBJECTIVE:  SUBJECTIVE STATEMENT:  Patient is a 48 y.o. female with Hx of ankylosing spondylitis who was seen today for physical therapy evaluation and treatment s/p L TKA (01/08/24).   PERTINENT HISTORY: Patient is a 48 y.o. female with Hx of ankylosing spondylitis who was seen today for physical therapy evaluation and treatment s/p L TKA (01/08/24). Pt's husband states they had to cancel last Thursday due to difficulty with getting out.   Pt  reports she is sometimes waiting too late for pain medication. Pt reports one episode of chest pain.   PAIN:   Pain Intensity: Present: 7/10, Best: 4/10, Worst: 10/10 Pain location: Medial knee, anterior thigh, posterior thigh, groin  Pain quality: sharp  Swelling: Yes  Numbness/Tingling: Yes; mild numbness adjacent to knee Focal weakness or buckling: Yes; hyperextended once with rapid walking.  Aggravating factors: keeping knee straight, lifting LLE, trying to lie down with LLE Relieving factors: ice, medication   Prior level of function: Independent Occupational demands: CT tech, X-ray tech; OR and dental imaging Hobbies: going to lake - boating  Red flags: Negative for personal history of cancer, chills/fever, night sweats, nausea, vomiting, unexplained weight gain/loss, unrelenting pain  PRECAUTIONS: None  WEIGHT BEARING RESTRICTIONS: WBAT  FALLS: Has patient fallen in last 6 months? No  Living Environment Lives with: lives with their spouse; 50 y/o daughter Lives in: House/apartment No steps to get into home; one-level home, handicap accessible, shower seat  Patient Goals: Get back to work; able to walk at community-level with no AD; improved QoL    OBJECTIVE:   Patient Surveys  LEFS 12/80  Cognition Patient is oriented to person, place, and time.  Recent memory is intact.  Remote memory is intact.  Attention span and concentration are intact.  Expressive speech is intact.  Patient's fund of knowledge is within normal limits for educational level.    Gross Musculoskeletal Assessment Tremor: None Bulk: Normal Tone: Normal  GAIT: Distance walked: *** Assistive device utilized: {Assistive devices:23999} Level of assistance: {Levels of assistance:24026} Comments: ***  Posture:  AROM AROM (Normal range in degrees) AROM  Hip Right Left  Flexion (125)    Extension (15)    Abduction (40)    Adduction     Internal Rotation (45)    External Rotation (45)         Knee    Flexion (135)    Extension (0)        Ankle    Dorsiflexion (20)    Plantarflexion (50)    Inversion (35)    Eversion (15    (* = pain; Blank rows = not tested)  LE MMT: MMT (out of 5) Right Left  Hip flexion    Hip extension    Hip abduction    Hip adduction    Hip internal rotation    Hip external rotation    Knee flexion    Knee extension    Ankle dorsiflexion    Ankle plantarflexion    Ankle inversion    Ankle eversion    (* = pain; Blank rows = not tested)   Muscle Length Hamstrings: R: {NEGATIVE/POSITIVE ZOX:09604} L: {NEGATIVE/POSITIVE VWU:98119} Quadriceps ([place]): R: {NEGATIVE/POSITIVE JYN:82956} L: {NEGATIVE/POSITIVE OZH:08657} Hip flexors Andy Bannister): R: {NEGATIVE/POSITIVE QIO:96295} L: {NEGATIVE/POSITIVE MWU:13244} IT band Asencion Lawless): R: {NEGATIVE/POSITIVE WNU:27253} L: {NEGATIVE/POSITIVE GUY:40347}  Palpation Location LEFT  RIGHT           Quadriceps    Medial Hamstrings    Lateral Hamstrings    Lateral Hamstring tendon  Medial Hamstring tendon    Quadriceps tendon    Patella    Patellar Tendon    Tibial Tuberosity    Medial joint line    Lateral joint line    MCL    LCL    Adductor Tubercle    Pes Anserine tendon    Infrapatellar fat pad    Fibular head    Popliteal fossa    (Blank rows = not tested) Graded on 0-4 scale (0 = no pain, 1 = pain, 2 = pain with wincing/grimacing/flinching, 3 = pain with withdrawal, 4 = unwilling to allow palpation), (Blank rows = not tested)   VASCULAR Dorsalis pedis and posterior tibial pulses are palpable Homan's sign (-) No sign of acute DVT ***     TODAY'S TREATMENT:    Therapeutic Exercise - for HEP establishment, discussion on appropriate exercise/activity modification, PT education   Reviewed baseline home exercises and provided handout for [employer] program (see Access Code); tactile cueing and therapist demonstration utilized as needed for carryover of proper technique to HEP.     Patient education on current condition, role of PT, prognosis, plan of care. Discussion on DVT prophylaxis and infection prevention.      PATIENT EDUCATION:  Education details: see above for patient education details  Person educated: Patient Education method: Explanation, Demonstration, and Handouts Education comprehension: verbalized understanding and returned demonstration   HOME EXERCISE PROGRAM:    ASSESSMENT:  CLINICAL IMPRESSION: Patient is a 48 y.o. female with Hx of ankylosing spondylitis who was seen today for physical therapy evaluation and treatment s/p L TKA (01/08/24).   OBJECTIVE IMPAIRMENTS: {opptimpairments:25111}.   ACTIVITY LIMITATIONS: {activitylimitations:27494}  PARTICIPATION LIMITATIONS: {participationrestrictions:25113}  PERSONAL FACTORS: {Personal factors:25162} are also affecting patient's functional outcome.   REHAB POTENTIAL: {rehabpotential:25112}  CLINICAL DECISION MAKING: {clinical decision making:25114}  EVALUATION COMPLEXITY: {Evaluation complexity:25115}   GOALS: Goals reviewed with patient? Yes  SHORT TERM GOALS: Target date: 02/04/2024  Pt will be independent with HEP to improve strength and decrease knee pain to improve pain-free function at home and work. Baseline: 01/14/24: Baseline HEP initiated.  Goal status: INITIAL   LONG TERM GOALS: Target date: {follow up:25551}  Pt will *** Baseline:  Goal status: INITIAL  2.  Pt will decrease worst knee pain by at least 3 points on the NPRS in order to demonstrate clinically significant reduction in knee pain. Baseline: *** Goal status: INITIAL  3.  Pt will decrease LEFS score by at least 9 points in order demonstrate clinically significant reduction in knee pain/disability.       Baseline: 01/15/24: 12/80 Goal status: INITIAL  4.  Pt will increase strength of *** by at least 1/2 MMT grade in order to demonstrate improvement in strength and function  Baseline: *** Goal status:  INITIAL   PLAN: PT FREQUENCY: 1-2x/week  PT DURATION: 8 weeks  PLANNED INTERVENTIONS: Therapeutic exercises, Therapeutic activity, Neuromuscular re-education, Balance training, Gait training, Patient/Family education, Self Care, Joint mobilization, Joint manipulation, Vestibular training, Canalith repositioning, Orthotic/Fit training, DME instructions, Dry Needling, Electrical stimulation, Spinal manipulation, Spinal mobilization, Cryotherapy, Moist heat, Taping, Traction, Ultrasound, Ionotophoresis 4mg /ml Dexamethasone , Manual therapy, and Re-evaluation.  PLAN FOR NEXT SESSION: ***   Denese Finn, PT, DPT [phone]  Aleatha Hunting, PT 01/15/2024, 3:17 PM

## 2024-01-15 ENCOUNTER — Encounter: Payer: Self-pay | Admitting: Physical Therapy

## 2024-01-15 ENCOUNTER — Ambulatory Visit: Attending: Student | Admitting: Physical Therapy

## 2024-01-15 DIAGNOSIS — M25562 Pain in left knee: Secondary | ICD-10-CM

## 2024-01-15 DIAGNOSIS — M6281 Muscle weakness (generalized): Secondary | ICD-10-CM | POA: Diagnosis not present

## 2024-01-15 DIAGNOSIS — R262 Difficulty in walking, not elsewhere classified: Secondary | ICD-10-CM

## 2024-01-15 DIAGNOSIS — M25662 Stiffness of left knee, not elsewhere classified: Secondary | ICD-10-CM | POA: Diagnosis not present

## 2024-01-16 ENCOUNTER — Other Ambulatory Visit: Payer: Self-pay

## 2024-01-16 MED ORDER — CYCLOBENZAPRINE HCL 5 MG PO TABS
5.0000 mg | ORAL_TABLET | Freq: Three times a day (TID) | ORAL | 0 refills | Status: DC | PRN
Start: 2024-01-16 — End: 2024-02-20
  Filled 2024-01-16: qty 40, 7d supply, fill #0

## 2024-01-17 ENCOUNTER — Encounter: Payer: Self-pay | Admitting: Physical Therapy

## 2024-01-17 ENCOUNTER — Ambulatory Visit: Admitting: Physical Therapy

## 2024-01-17 DIAGNOSIS — M6281 Muscle weakness (generalized): Secondary | ICD-10-CM

## 2024-01-17 DIAGNOSIS — R262 Difficulty in walking, not elsewhere classified: Secondary | ICD-10-CM

## 2024-01-17 DIAGNOSIS — M25662 Stiffness of left knee, not elsewhere classified: Secondary | ICD-10-CM | POA: Diagnosis not present

## 2024-01-17 DIAGNOSIS — M25562 Pain in left knee: Secondary | ICD-10-CM | POA: Diagnosis not present

## 2024-01-17 NOTE — Therapy (Signed)
 OUTPATIENT PHYSICAL THERAPY TREATMENT  Patient Name: Hailey [name] MRN: [id number] DOB:1976/05/31, 48 y.o., female Today's Date: 01/17/2024  END OF SESSION:  PT End of Session - 01/17/24 1458     Visit Number 2    Number of Visits 17    Date for PT Re-Evaluation 03/11/24    PT Start Time 1519    PT Stop Time 1603    PT Time Calculation (min) 44 min    Activity Tolerance Patient tolerated treatment well    Behavior During Therapy [facility] for tasks assessed/performed              Past Medical History:  Diagnosis Date   Allergic state    Allergy    Ankylosing spondylitis (HCC)    followed by rheumatology Dr. Lydia Sams   Arthritis    Asthma    Cervical disc disease    Depression    Enlarged lymph nodes    GERD (gastroesophageal reflux disease)    H/O colonoscopy    HPV (human papilloma virus) infection    Hypothyroidism    Interstitial cystitis    LGSIL (low grade squamous intraepithelial dysplasia)    Paresthesia    Status post laparoscopic cholecystectomy 07/13/2016   Past Surgical History:  Procedure Laterality Date   BREAST BIOPSY Right 10/18/2015   bx/clip- neg   CHOLECYSTECTOMY N/A 07/07/2016   Procedure: LAPAROSCOPIC CHOLECYSTECTOMY;  Surgeon: Emmalene Hare, MD;  Location: [facility] ORS;  Service: General;  Laterality: N/A;   COLONOSCOPY WITH PROPOFOL  N/A 10/11/2015   Procedure: COLONOSCOPY WITH PROPOFOL ;  Surgeon: Deveron Fly, MD;  Location: [facility] ENDOSCOPY;  Service: Endoscopy;  Laterality: N/A;   ESOPHAGOGASTRODUODENOSCOPY (EGD) WITH PROPOFOL  N/A 10/11/2015   Procedure: ESOPHAGOGASTRODUODENOSCOPY (EGD) WITH PROPOFOL ;  Surgeon: Deveron Fly, MD;  Location: [facility] ENDOSCOPY;  Service: Endoscopy;  Laterality: N/A;   TONSILLECTOMY     TOTAL KNEE ARTHROPLASTY Left 01/08/2024   Procedure: LEFT TOTAL KNEE ARTHROPLASTYL;  Surgeon: Claiborne Crew, MD;  Location: [facility] ORS;  Service: Orthopedics;  Laterality: Left;   TOTAL THYROIDECTOMY  2005   goiter   Patient Active  Problem List   Diagnosis Date Noted   S/P total knee arthroplasty, left 01/08/2024   S/P total knee arthroplasty 01/08/2024   Atypical chest pain 11/23/2023   Preop cardiovascular exam 11/23/2023   Dyslipidemia 11/23/2023   Generalized lymphadenopathy 09/23/2021   B12 deficiency 09/23/2021   Fatigue 09/23/2021   Primary osteoarthritis of both knees 04/29/2021   Ankylosing spondylitis (HCC) 12/30/2018   Asthma in adult without complication 12/30/2018   Chronic diarrhea 12/30/2018   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   Interstitial cystitis 07/03/2016   Pelvic pain in female 07/03/2016   Chondromalacia of knee, 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   BMI 38.0-38.9,adult 02/14/2016   Polyarthralgia 02/14/2016   GERD (gastroesophageal reflux disease) 02/10/2014    PCP: Sheron Dixons, MD  REFERRING PROVIDER: Earnie Gola, PA-C  REFERRING DIAG: W09.81 (ICD-10-CM) - Unilateral primary osteoarthritis, left knee   RATIONALE FOR EVALUATION AND TREATMENT: Rehabilitation  THERAPY DIAG: Acute pain of left knee  Stiffness of left knee, not elsewhere classified  Difficulty in walking, not elsewhere classified  Muscle weakness (generalized)  ONSET DATE: 01/08/24 (DOS, L TKA)  FOLLOW-UP APPT SCHEDULED WITH REFERRING PROVIDER: Yes ; next f/u in 1 week for post-op appointment  PERTINENT HISTORY: Patient is a 48 y.o. female with Hx of ankylosing spondylitis s/p L TKA (01/08/24). Pt's husband states they had to cancel last Thursday due to difficulty with getting out of the  home. She and her husband report notable back pain as well with attempting to get comfortable in bed. Pt has significant ecchymosis along posterolateral knee and posterolateral thigh following surgery.   Pt reports she is sometimes waiting too late for pain medication. Pt reports one episode of chest pain.   PAIN:   Pain Intensity: Present: 7/10, Best: 4/10, Worst: 10/10 Pain location: Medial knee, anterior thigh, posterior  thigh, groin  Pain quality: sharp  Swelling: Yes  Numbness/Tingling: Yes; mild numbness adjacent to knee Focal weakness or buckling: Yes; hyperextended once with rapid walking.  Aggravating factors: keeping knee straight, lifting LLE, trying to lie down with LLE Relieving factors: ice, medication  Prior level of function: Independent Occupational demands: CT tech, X-ray tech; OR and dental imaging Hobbies: going to lake - boating  Red flags: Negative for personal history of cancer, chills/fever, night sweats, nausea, vomiting, unexplained weight gain/loss, unrelenting pain  PRECAUTIONS: None  WEIGHT BEARING RESTRICTIONS: WBAT  FALLS: Has patient fallen in last 6 months? No  Living Environment Lives with: lives with their spouse; 40 y/o daughter Lives in: House/apartment No steps to get into home; one-level home, handicap accessible, shower seat  Patient Goals: Get back to work; able to walk at community-level with no AD; improved QoL    OBJECTIVE:     Gross Musculoskeletal Assessment Tremor: None Bulk: R quad atrophy Tone: Normal  GAIT: Distance walked: 50 ft Assistive device utilized: Environmental consultant - 2 wheeled Level of assistance: SBA Comments: Heavy lean onto walker, good terminal knee extension, decreased stance time on surgical LE, shortened step length bilaterally, sound heel strike   AROM AROM (Normal range in degrees) AROM 01/15/24  Hip Right Left  Flexion (125)    Extension (15)    Abduction (40)    Adduction     Internal Rotation (45)    External Rotation (45)        Knee    Flexion (135) 120 78  Extension (0) WNL -7      Ankle    Dorsiflexion (20) WNL WNL  Plantarflexion (50)    Inversion (35)    Eversion (15    (* = pain; Blank rows = not tested)  LE MMT: MMT (out of 5) Right 01/15/24 Left 01/15/24  Hip flexion 4- 4-  Hip extension    Hip abduction (seated) 4 4  Hip adduction    Hip internal rotation    Hip external rotation    Knee flexion 4+ 2   Knee extension 4- 3+  Ankle dorsiflexion    Ankle plantarflexion    Ankle inversion    Ankle eversion    (* = pain; Blank rows = not tested)  Palpation Location LEFT  RIGHT           Quadriceps 1   Medial Hamstrings 1   Lateral Hamstrings 1   Lateral Hamstring tendon 1   Medial Hamstring tendon 1   Quadriceps tendon 1   Patella 1   Patellar Tendon 1   Tibial Tuberosity 1   Medial joint line 2   Lateral joint line 1   MCL    LCL    Adductor Tubercle    Pes Anserine tendon    Infrapatellar fat pad    Fibular head    Popliteal fossa    (Blank rows = not tested) Graded on 0-4 scale (0 = no pain, 1 = pain, 2 = pain with wincing/grimacing/flinching, 3 = pain with withdrawal, 4 = unwilling to  allow palpation), (Blank rows = not tested)   VASCULAR Dorsalis pedis and posterior tibial pulses are palpable Homan's sign (-) No sign of acute DVT     TODAY'S TREATMENT:   SUBJECTIVE STATEMENT:   Patient reports difficult time with sleeping and being up every half hour. Patient reports having  a hard time sleeping due to pain. Patient reports not being fully compliant with HEP since earlier this week.     Manual Therapy - for symptom modulation, soft tissue sensitivity and mobility, joint mobility, ROM   L knee PROM flexion and extension as tolerated in supine x 5 minutes L knee flexion in sitting; x 5 attempts  -up to 83 deg     Therapeutic Exercise - for improved soft tissue flexibility and extensibility as needed for ROM, improved strength as needed to improve performance of CKC activities/functional movements  Quad set, with towel roll; x15, 5 sec hold Ball squeeze, hooklying; x15, 3 sec hold  SAQ, attempted x 5; poor active knee extension and dec quad activation Heel slide with sheet around foot; x10, 5 sec hold   Ambulate x 50 ft in gym with FWW with cueing for heel strike at initial contact and attaining full knee extension at terminal swing  PATIENT EDUCATION:  Discussed positioning in bed for comfort and continued monitoring for signs of DVT   Cold pack (unbilled) - for anti-inflammatory and analgesic effect as needed for reduced pain and improved ability to participate in active PT intervention, along L knee in supine, x 5 minutes    PATIENT EDUCATION:  Education details: see above for patient education details Person educated: Patient Education method: Explanation, Demonstration, and Handouts Education comprehension: verbalized understanding and returned demonstration   HOME EXERCISE PROGRAM:  Pt continuing HEP given from hospital. Discussed continued work on propping L knee and maintaining in extended position for low-load, long duration stretch.   ASSESSMENT:  CLINICAL IMPRESSION: Patient is able to attain L knee flexion just below 90 deg, knee extension to -5. She exhibits quad inhibition and difficulty with open-chain quad isotonics. Pt needs to work further on quad activation and knee ROM at home. We discussed propping knee for stretch into extension. Patient has current activity limitations in gait, transfers, bed mobility, and self-care ADLs (e.g. dressing/bathing). Associated impairments include: L knee/thigh pain, decreased L knee ROM, decreased strength, post-op edema, and L knee stiffness. Pt will continue to benefit from skilled PT services to address deficits and improve function.   OBJECTIVE IMPAIRMENTS: Abnormal gait, decreased mobility, difficulty walking, decreased ROM, decreased strength, hypomobility, impaired flexibility, and pain.   ACTIVITY LIMITATIONS: bending, sitting, standing, squatting, sleeping, stairs, transfers, bed mobility, bathing, dressing, and locomotion level  PARTICIPATION LIMITATIONS: meal prep, cleaning, laundry, driving, shopping, community activity, occupation, and yard work  PERSONAL FACTORS: 3+ comorbidities: (ankylosing spondylitis, dyslipidemia, depression) are also affecting patient's functional  outcome.   REHAB POTENTIAL: Good  CLINICAL DECISION MAKING: Evolving/moderate complexity  EVALUATION COMPLEXITY: Moderate   GOALS: Goals reviewed with patient? Yes  SHORT TERM GOALS: Target date: 02/04/2024  Pt will be independent with HEP to improve strength and decrease knee pain to improve pain-free function at home and work. Baseline: 01/14/24: Baseline HEP initiated.  Goal status: INITIAL   LONG TERM GOALS: Target date: 03/13/2024  Pt will have L knee ROM 0-120 as needed for normalized motion required for transferring from surfaces of various height and to maintain symmetrical gait pattern Baseline: 01/15/24: L knee AROM in supine: -7-78. Goal status: INITIAL  2.  Pt will decrease worst knee pain by at least 3 points on the NPRS in order to demonstrate clinically significant reduction in knee pain. Baseline: 01/15/24: 10/10 pain at worst.  Goal status: INITIAL  3.  Pt will decrease LEFS score by at least 9 points in order demonstrate clinically significant reduction in knee pain/disability.       Baseline: 01/15/24: 12/80 Goal status: INITIAL  4.  Pt will increase strength of tested LE musculature by at least 4+/5 or greater MMT grade in order to demonstrate improvement in strength and function  Baseline: 01/15/24: LLE MMT 2 to 4/5 (see chart above).  Goal status: INITIAL  5.  Pt will ambulate at community level (660 feet or greater in office) with no AD and symmetrical weightbearing on either LE without major deviations as needed for return to independent mobility and return to work Baseline: 01/15/24: LLE MMT 2 to 4/5 (see chart above).  Goal status: INITIAL   PLAN: PT FREQUENCY: 1-2x/week  PT DURATION: 8 weeks  PLANNED INTERVENTIONS: Therapeutic exercises, Therapeutic activity, Neuromuscular re-education, Balance training, Gait training, Patient/Family education, Self Care, Joint mobilization, Joint manipulation, Vestibular training, Canalith repositioning, Orthotic/Fit  training, DME instructions, Dry Needling, Electrical stimulation, Spinal manipulation, Spinal mobilization, Cryotherapy, Moist heat, Taping, Traction, Ultrasound, Ionotophoresis 4mg /ml Dexamethasone , Manual therapy, and Re-evaluation.  PLAN FOR NEXT SESSION: Begin with isometrics and gentle knee ROM in non-weightbearing positions, initiate cycling at 2 weeks and progressive weightbearing activity as tolerated.    Denese Finn, PT, DPT #[id number]  Aleatha Hunting, PT 01/17/2024, 4:12 PM

## 2024-01-21 ENCOUNTER — Other Ambulatory Visit: Payer: Self-pay

## 2024-01-21 MED ORDER — OXYCODONE HCL 5 MG PO TABS
5.0000 mg | ORAL_TABLET | ORAL | 0 refills | Status: DC | PRN
  Filled 2024-01-21: qty 30, 5d supply, fill #0

## 2024-01-22 ENCOUNTER — Encounter: Payer: Self-pay | Admitting: Physical Therapy

## 2024-01-22 ENCOUNTER — Ambulatory Visit: Admitting: Physical Therapy

## 2024-01-22 DIAGNOSIS — M25562 Pain in left knee: Secondary | ICD-10-CM

## 2024-01-22 DIAGNOSIS — M25662 Stiffness of left knee, not elsewhere classified: Secondary | ICD-10-CM

## 2024-01-22 DIAGNOSIS — R262 Difficulty in walking, not elsewhere classified: Secondary | ICD-10-CM

## 2024-01-22 DIAGNOSIS — M6281 Muscle weakness (generalized): Secondary | ICD-10-CM | POA: Diagnosis not present

## 2024-01-22 NOTE — Therapy (Signed)
 OUTPATIENT PHYSICAL THERAPY TREATMENT  Patient Name: Hailey [name] MRN: [id number] DOB:[birth year], 48 y.o., female Today's Date: 01/22/2024  END OF SESSION:  PT End of Session - 01/22/24 1521     Visit Number 3    Number of Visits 17    Date for PT Re-Evaluation 03/11/24    PT Start Time 1520    PT Stop Time 1600    PT Time Calculation (min) 40 min    Activity Tolerance Patient tolerated treatment well    Behavior During Therapy [facility] for tasks assessed/performed               Past Medical History:  Diagnosis Date   Allergic state    Allergy    Ankylosing spondylitis (HCC)    followed by rheumatology Dr. Lydia Sams   Arthritis    Asthma    Cervical disc disease    Depression    Enlarged lymph nodes    GERD (gastroesophageal reflux disease)    H/O colonoscopy    HPV (human papilloma virus) infection    Hypothyroidism    Interstitial cystitis    LGSIL (low grade squamous intraepithelial dysplasia)    Paresthesia    Status post laparoscopic cholecystectomy 07/13/2016   Past Surgical History:  Procedure Laterality Date   BREAST BIOPSY Right 10/18/2015   bx/clip- neg   CHOLECYSTECTOMY N/A 07/07/2016   Procedure: LAPAROSCOPIC CHOLECYSTECTOMY;  Surgeon: Emmalene Hare, MD;  Location: [facility] ORS;  Service: General;  Laterality: N/A;   COLONOSCOPY WITH PROPOFOL  N/A 10/11/2015   Procedure: COLONOSCOPY WITH PROPOFOL ;  Surgeon: Deveron Fly, MD;  Location: [facility] ENDOSCOPY;  Service: Endoscopy;  Laterality: N/A;   ESOPHAGOGASTRODUODENOSCOPY (EGD) WITH PROPOFOL  N/A 10/11/2015   Procedure: ESOPHAGOGASTRODUODENOSCOPY (EGD) WITH PROPOFOL ;  Surgeon: Deveron Fly, MD;  Location: [facility] ENDOSCOPY;  Service: Endoscopy;  Laterality: N/A;   TONSILLECTOMY     TOTAL KNEE ARTHROPLASTY Left 01/08/2024   Procedure: LEFT TOTAL KNEE ARTHROPLASTYL;  Surgeon: Claiborne Crew, MD;  Location: [facility] ORS;  Service: Orthopedics;  Laterality: Left;   TOTAL THYROIDECTOMY  2005   goiter   Patient Active  Problem List   Diagnosis Date Noted   S/P total knee arthroplasty, left 01/08/2024   S/P total knee arthroplasty 01/08/2024   Atypical chest pain 11/23/2023   Preop cardiovascular exam 11/23/2023   Dyslipidemia 11/23/2023   Generalized lymphadenopathy 09/23/2021   B12 deficiency 09/23/2021   Fatigue 09/23/2021   Primary osteoarthritis of both knees 04/29/2021   Ankylosing spondylitis (HCC) 12/30/2018   Asthma in adult without complication 12/30/2018   Chronic diarrhea 12/30/2018   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   Interstitial cystitis 07/03/2016   Pelvic pain in female 07/03/2016   Chondromalacia of knee, 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   BMI 38.0-38.9,adult 02/14/2016   Polyarthralgia 02/14/2016   GERD (gastroesophageal reflux disease) 02/10/2014    PCP: Sheron Dixons, MD  REFERRING PROVIDER: Earnie Gola, PA-C  REFERRING DIAG: W09.81 (ICD-10-CM) - Unilateral primary osteoarthritis, left knee   RATIONALE FOR EVALUATION AND TREATMENT: Rehabilitation  THERAPY DIAG: Acute pain of left knee  Stiffness of left knee, not elsewhere classified  Difficulty in walking, not elsewhere classified  Muscle weakness (generalized)  ONSET DATE: 01/08/24 (DOS, L TKA)  FOLLOW-UP APPT SCHEDULED WITH REFERRING PROVIDER: Yes ; next f/u in 1 week for post-op appointment  PERTINENT HISTORY: Patient is a 48 y.o. female with Hx of ankylosing spondylitis s/p L TKA (01/08/24). Pt's husband states they had to cancel last Thursday due to difficulty with getting out of  the home. She and her husband report notable back pain as well with attempting to get comfortable in bed. Pt has significant ecchymosis along posterolateral knee and posterolateral thigh following surgery.   Pt reports she is sometimes waiting too late for pain medication. Pt reports one episode of chest pain.   PAIN:   Pain Intensity: Present: 7/10, Best: 4/10, Worst: 10/10 Pain location: Medial knee, anterior thigh, posterior  thigh, groin  Pain quality: sharp  Swelling: Yes  Numbness/Tingling: Yes; mild numbness adjacent to knee Focal weakness or buckling: Yes; hyperextended once with rapid walking.  Aggravating factors: keeping knee straight, lifting LLE, trying to lie down with LLE Relieving factors: ice, medication  Prior level of function: Independent Occupational demands: CT tech, X-ray tech; OR and dental imaging Hobbies: going [place] lake - boating  Red flags: Negative for personal history of cancer, chills/fever, night sweats, nausea, vomiting, unexplained weight gain/loss, unrelenting pain  PRECAUTIONS: None  WEIGHT BEARING RESTRICTIONS: WBAT  FALLS: Has patient fallen in last 6 months? No  Living Environment Lives with: lives with their spouse; 42 y/o daughter Lives in: House/apartment No steps to get into home; one-level home, handicap accessible, shower seat  Patient Goals: Get back to work; able to walk at community-level with no AD; improved QoL    OBJECTIVE:     Gross Musculoskeletal Assessment Tremor: None Bulk: R quad atrophy Tone: Normal  GAIT: Distance walked: 50 ft Assistive device utilized: Environmental consultant - 2 wheeled Level of assistance: SBA Comments: Heavy lean onto walker, good terminal knee extension, decreased stance time on surgical LE, shortened step length bilaterally, sound heel strike   AROM AROM (Normal range in degrees) AROM 01/15/24  Hip Right Left  Flexion (125)    Extension (15)    Abduction (40)    Adduction     Internal Rotation (45)    External Rotation (45)        Knee    Flexion (135) 120 78  Extension (0) WNL -7      Ankle    Dorsiflexion (20) WNL WNL  Plantarflexion (50)    Inversion (35)    Eversion (15    (* = pain; Blank rows = not tested)  LE MMT: MMT (out of 5) Right 01/15/24 Left 01/15/24  Hip flexion 4- 4-  Hip extension    Hip abduction (seated) 4 4  Hip adduction    Hip internal rotation    Hip external rotation    Knee flexion 4+ 2   Knee extension 4- 3+  Ankle dorsiflexion    Ankle plantarflexion    Ankle inversion    Ankle eversion    (* = pain; Blank rows = not tested)  Palpation Location LEFT  RIGHT           Quadriceps 1   Medial Hamstrings 1   Lateral Hamstrings 1   Lateral Hamstring tendon 1   Medial Hamstring tendon 1   Quadriceps tendon 1   Patella 1   Patellar Tendon 1   Tibial Tuberosity 1   Medial joint line 2   Lateral joint line 1   MCL    LCL    Adductor Tubercle    Pes Anserine tendon    Infrapatellar fat pad    Fibular head    Popliteal fossa    (Blank rows = not tested) Graded on 0-4 scale (0 = no pain, 1 = pain, 2 = pain with wincing/grimacing/flinching, 3 = pain with withdrawal, 4 = unwilling  to allow palpation), (Blank rows = not tested)   VASCULAR Dorsalis pedis and posterior tibial pulses are palpable Homan's sign (-) No sign of acute DVT     TODAY'S TREATMENT:   SUBJECTIVE STATEMENT:   Patient reports 4-5/10 pain at arrival. Patient reports compliance with HEP 1x/day.     Manual Therapy - for symptom modulation, soft tissue sensitivity and mobility, joint mobility, ROM   L knee PROM flexion and extension as tolerated in supine x 5 minutes TF posterior mobilization gr II for pain control; 2 x 30 sec bouts Patellar mobilization; x 30 sec bouts, medial/lateral and superior/inferior  Knee ROM -5-102   Therapeutic Exercise - for improved soft tissue flexibility and extensibility as needed for ROM, improved strength as needed to improve performance of CKC activities/functional movements  Knee prop extension stretch with ice pack; x 3 minutes (L ankle propped on two pillows)  Quad set, with towel roll; 2x10, 5 sec hold Ball squeeze, hooklying; 2 x 10, 3 sec hold  Supine hip abduction; 2 x 10, [employer], attempted x 10; improved active knee extension versus previous visit  Ambulate x 2 laps in gym with FWW with cueing for heel strike at initial contact and  attaining full knee extension at terminal swing   PATIENT EDUCATION: Discussed knee prop stretch for extension and frequent work on quad sets at home for quad activation and knee extension. Instructed pt on continuing HEP 2x/day.    *next visit* NuStep  *not today* Heel slide with sheet around foot; x10, 5 sec hold    PATIENT EDUCATION:  Education details: see above for patient education details Person educated: Patient Education method: Explanation, Demonstration, and Handouts Education comprehension: verbalized understanding and returned demonstration   HOME EXERCISE PROGRAM:  Pt continuing HEP given from hospital. Discussed continued work on propping L knee and maintaining in extended position for low-load, long duration stretch.   ASSESSMENT:  CLINICAL IMPRESSION: Knee ROM is gradually improving compared to initial evaluation. She is still having difficulties with pain control, but NPRS is notably lower than that reported at eval. She has remaining terminal knee extension deficit requiring further intervention with PT and consistent HEP. Associated impairments include: L knee/thigh pain, decreased L knee ROM, decreased strength, post-op edema, and L knee stiffness. Pt will continue to benefit from skilled PT services to address deficits and improve function.   OBJECTIVE IMPAIRMENTS: Abnormal gait, decreased mobility, difficulty walking, decreased ROM, decreased strength, hypomobility, impaired flexibility, and pain.   ACTIVITY LIMITATIONS: bending, sitting, standing, squatting, sleeping, stairs, transfers, bed mobility, bathing, dressing, and locomotion level  PARTICIPATION LIMITATIONS: meal prep, cleaning, laundry, driving, shopping, community activity, occupation, and yard work  PERSONAL FACTORS: 3+ comorbidities: (ankylosing spondylitis, dyslipidemia, depression) are also affecting patient's functional outcome.   REHAB POTENTIAL: Good  CLINICAL DECISION MAKING:  Evolving/moderate complexity  EVALUATION COMPLEXITY: Moderate   GOALS: Goals reviewed with patient? Yes  SHORT TERM GOALS: Target date: 02/04/2024  Pt will be independent with HEP to improve strength and decrease knee pain to improve pain-free function at home and work. Baseline: 01/14/24: Baseline HEP initiated.  Goal status: INITIAL   LONG TERM GOALS: Target date: 03/13/2024  Pt will have L knee ROM 0-120 as needed for normalized motion required for transferring from surfaces of various height and to maintain symmetrical gait pattern Baseline: 01/15/24: L knee AROM in supine: -7-78. Goal status: INITIAL  2.  Pt will decrease worst knee pain by at least 3 points on the NPRS  in order to demonstrate clinically significant reduction in knee pain. Baseline: 01/15/24: 10/10 pain at worst.  Goal status: INITIAL  3.  Pt will decrease LEFS score by at least 9 points in order demonstrate clinically significant reduction in knee pain/disability.       Baseline: 01/15/24: 12/80 Goal status: INITIAL  4.  Pt will increase strength of tested LE musculature by at least 4+/5 or greater MMT grade in order to demonstrate improvement in strength and function  Baseline: 01/15/24: LLE MMT 2 to 4/5 (see chart above).  Goal status: INITIAL  5.  Pt will ambulate at community level (660 feet or greater in office) with no AD and symmetrical weightbearing on either LE without major deviations as needed for return to independent mobility and return to work Baseline: 01/15/24: LLE MMT 2 to 4/5 (see chart above).  Goal status: INITIAL   PLAN: PT FREQUENCY: 1-2x/week  PT DURATION: 8 weeks  PLANNED INTERVENTIONS: Therapeutic exercises, Therapeutic activity, Neuromuscular re-education, Balance training, Gait training, Patient/Family education, Self Care, Joint mobilization, Joint manipulation, Vestibular training, Canalith repositioning, Orthotic/Fit training, DME instructions, Dry Needling, Electrical stimulation,  Spinal manipulation, Spinal mobilization, Cryotherapy, Moist heat, Taping, Traction, Ultrasound, Ionotophoresis 4mg /ml Dexamethasone , Manual therapy, and Re-evaluation.  PLAN FOR NEXT SESSION: Begin with isometrics and gentle knee ROM in non-weightbearing positions, initiate cycling at 2 weeks and progressive weightbearing activity as tolerated.    Denese Finn, PT, DPT #[id number]  Aleatha Hunting, PT 01/22/2024, 3:22 PM

## 2024-01-22 NOTE — Discharge Summary (Signed)
 Patient ID: Hailey [name] MRN: [id number] DOB/AGE: [birth year] 48 y.o.  Admit date: 01/08/2024 Discharge date: 01/09/2024  Admission Diagnoses:  Left knee osteoarthritis  Discharge Diagnoses:  Principal Problem:   S/P total knee arthroplasty Active Problems:   S/P total knee arthroplasty, left   Past Medical History:  Diagnosis Date   Allergic state    Allergy    Ankylosing spondylitis (HCC)    followed by rheumatology Dr. Lydia Sams   Arthritis    Asthma    Cervical disc disease    Depression    Enlarged lymph nodes    GERD (gastroesophageal reflux disease)    H/O colonoscopy    HPV (human papilloma virus) infection    Hypothyroidism    Interstitial cystitis    LGSIL (low grade squamous intraepithelial dysplasia)    Paresthesia    Status post laparoscopic cholecystectomy 07/13/2016    Surgeries: Procedure(s): LEFT TOTAL KNEE ARTHROPLASTYL on 01/08/2024   Consultants:   Discharged Condition: Improved  Hospital Course: Hailey [name] is an 48 y.o. female who was admitted 01/08/2024 for operative treatment ofS/P total knee arthroplasty. Patient has severe unremitting pain that affects sleep, daily activities, and work/hobbies. After pre-op clearance the patient was taken to the operating room on 01/08/2024 and underwent  Procedure(s): LEFT TOTAL KNEE ARTHROPLASTYL.    Patient was given perioperative antibiotics:  Anti-infectives (From admission, onward)    Start     Dose/Rate Route Frequency Ordered Stop   01/08/24 1600  ceFAZolin  (ANCEF ) IVPB 2g/100 mL premix        2 g 200 mL/hr over 30 Minutes Intravenous Every 6 hours 01/08/24 1450 01/09/24 1036   01/08/24 0800  ceFAZolin  (ANCEF ) IVPB 2g/100 mL premix        2 g 200 mL/hr over 30 Minutes Intravenous On call to O.R. 01/08/24 0758 01/08/24 1012        Patient was given sequential compression devices, early ambulation, and chemoprophylaxis to prevent DVT. Patient worked with PT and was meeting their goals regarding  safe ambulation and transfers.  Patient benefited maximally from hospital stay and there were no complications.    Recent vital signs: No data found.   Recent laboratory studies: No results for input(s): "WBC", "HGB", "HCT", "PLT", "NA", "K", "CL", "CO2", "BUN", "CREATININE", "GLUCOSE", "INR", "CALCIUM" in the last 72 hours.  Invalid input(s): "PT", "2"   Discharge Medications:   Allergies as of 01/09/2024       Reactions   Levofloxacin Rash, Dermatitis   Lodine [etodolac] Shortness Of Breath   Ciprofloxacin Hives   Latex Rash        Medication List     TAKE these medications    albuterol  108 (90 Base) MCG/ACT inhaler Commonly known as: VENTOLIN  HFA Inhale 2 puffs into the lungs every 6 (six) hours as needed for wheezing or shortness of breath.   aspirin  81 MG chewable tablet Chew 1 tablet (81 mg total) by mouth 2 (two) times daily.   celecoxib  200 MG capsule Commonly known as: CELEBREX  Take 1 capsule (200 mg total) by mouth 2 (two) times daily with a meal. What changed: Another medication with the same name was removed. Continue taking this medication, and follow the directions you see here.   fluticasone -salmeterol 100-50 MCG/ACT Aepb Commonly known as: Advair  Diskus Inhale 1 puff into the lungs 2 (two) times daily as needed.   methocarbamol  500 MG tablet Commonly known as: ROBAXIN  Take 1 tablet (500 mg total) by mouth every 6 (six) hours as  needed.   ondansetron  4 MG disintegrating tablet Commonly known as: ZOFRAN -ODT Take 1 tablet (4 mg total) by mouth every 6 (six) hours as needed.   polyethylene glycol powder 17 GM/SCOOP powder Commonly known as: GLYCOLAX /MIRALAX  Take 17 g by mouth daily.   senna 8.6 MG Tabs tablet Commonly known as: SENOKOT Take 2 tablets (17.2 mg total) by mouth at bedtime.   Synthroid  200 MCG tablet Generic drug: levothyroxine  Take 1 tablet (200 mcg total) by mouth once daily. Take on an empty stomach with a glass of water  at  least 30-60 minutes before breakfast.   tranexamic acid  650 MG Tabs tablet Commonly known as: LYSTEDA  Take 3 tablets (1,950 mg total) by mouth daily.               Discharge Care Instructions  (From admission, onward)           Start     Ordered   01/09/24 0000  Change dressing       Comments: Maintain surgical dressing until follow up in the clinic. If the edges start to pull up, may reinforce with tape. If the dressing is no longer working, may remove and cover with gauze and tape, but must keep the area dry and clean.  Call with any questions or concerns.   01/09/24 0829            Diagnostic Studies: No results found.  Disposition: Discharge disposition: 01-Home or Self Care       Discharge Instructions     Call MD / Call 911   Complete by: As directed    If you experience chest pain or shortness of breath, CALL 911 and be transported to the hospital emergency room.  If you develope a fever above 101 F, pus ([place] drainage) or increased drainage or redness at the wound, or calf pain, call your surgeon's office.   Change dressing   Complete by: As directed    Maintain surgical dressing until follow up in the clinic. If the edges start to pull up, may reinforce with tape. If the dressing is no longer working, may remove and cover with gauze and tape, but must keep the area dry and clean.  Call with any questions or concerns.   Constipation Prevention   Complete by: As directed    Drink plenty of fluids.  Prune juice may be helpful.  You may use a stool softener, such as Colace (over the counter) 100 mg twice a day.  Use MiraLax  (over the counter) for constipation as needed.   Diet - low sodium heart healthy   Complete by: As directed    Increase activity slowly as tolerated   Complete by: As directed    Weight bearing as tolerated with assist device (walker, cane, etc) as directed, use it as long as suggested by your surgeon or therapist, typically at least  4-6 weeks.   Post-operative opioid taper instructions:   Complete by: As directed    POST-OPERATIVE OPIOID TAPER INSTRUCTIONS: It is important to wean off of your opioid medication as soon as possible. If you do not need pain medication after your surgery it is ok to stop day one. Opioids include: Codeine, Hydrocodone (Norco, Vicodin), Oxycodone (Percocet, oxycontin ) and hydromorphone  amongst others.  Long term and even short term use of opiods can cause: Increased pain response Dependence Constipation Depression Respiratory depression And more.  Withdrawal symptoms can include Flu like symptoms Nausea, vomiting And more Techniques to manage these symptoms Hydrate well Eat  regular healthy meals Stay active Use relaxation techniques(deep breathing, meditating, yoga) Do Not substitute Alcohol to help with tapering If you have been on opioids for less than two weeks and do not have pain than it is ok to stop all together.  Plan to wean off of opioids This plan should start within one week post op of your joint replacement. Maintain the same interval or time between taking each dose and first decrease the dose.  Cut the total daily intake of opioids by one tablet each day Next start to increase the time between doses. The last dose that should be eliminated is the evening dose.      TED hose   Complete by: As directed    Use stockings (TED hose) for 2 weeks on both leg(s).  You may remove them at night for sleeping.        Follow-up Information     Claiborne Crew, MD. Schedule an appointment as soon as possible for a visit in 2 week(s).   Specialty: Orthopedic Surgery Contact information: [address] [place] [address] [place] [address] [phone]                  Signed: Earnie Gola 01/22/2024, 7:11 AM

## 2024-01-24 ENCOUNTER — Ambulatory Visit: Admitting: Physical Therapy

## 2024-01-24 DIAGNOSIS — M6281 Muscle weakness (generalized): Secondary | ICD-10-CM

## 2024-01-24 DIAGNOSIS — R262 Difficulty in walking, not elsewhere classified: Secondary | ICD-10-CM

## 2024-01-24 DIAGNOSIS — M25562 Pain in left knee: Secondary | ICD-10-CM

## 2024-01-24 DIAGNOSIS — M25662 Stiffness of left knee, not elsewhere classified: Secondary | ICD-10-CM

## 2024-01-24 NOTE — Therapy (Unsigned)
 OUTPATIENT PHYSICAL THERAPY TREATMENT  Patient Name: Hailey [name] MRN: [id number] DOB:Sep 08, 1976, 48 y.o., female Today's Date: 01/24/2024  END OF SESSION:  PT End of Session - 01/24/24 1520     Visit Number 4    Number of Visits 17    Date for PT Re-Evaluation 03/11/24    PT Start Time 1520    PT Stop Time 1602    PT Time Calculation (min) 42 min    Activity Tolerance Patient tolerated treatment well    Behavior During Therapy [facility] for tasks assessed/performed                Past Medical History:  Diagnosis Date   Allergic state    Allergy    Ankylosing spondylitis (HCC)    followed by rheumatology Dr. Lydia Sams   Arthritis    Asthma    Cervical disc disease    Depression    Enlarged lymph nodes    GERD (gastroesophageal reflux disease)    H/O colonoscopy    HPV (human papilloma virus) infection    Hypothyroidism    Interstitial cystitis    LGSIL (low grade squamous intraepithelial dysplasia)    Paresthesia    Status post laparoscopic cholecystectomy 07/13/2016   Past Surgical History:  Procedure Laterality Date   BREAST BIOPSY Right 10/18/2015   bx/clip- neg   CHOLECYSTECTOMY N/A 07/07/2016   Procedure: LAPAROSCOPIC CHOLECYSTECTOMY;  Surgeon: Emmalene Hare, MD;  Location: [facility] ORS;  Service: General;  Laterality: N/A;   COLONOSCOPY WITH PROPOFOL  N/A 10/11/2015   Procedure: COLONOSCOPY WITH PROPOFOL ;  Surgeon: Deveron Fly, MD;  Location: [facility] ENDOSCOPY;  Service: Endoscopy;  Laterality: N/A;   ESOPHAGOGASTRODUODENOSCOPY (EGD) WITH PROPOFOL  N/A 10/11/2015   Procedure: ESOPHAGOGASTRODUODENOSCOPY (EGD) WITH PROPOFOL ;  Surgeon: Deveron Fly, MD;  Location: [facility] ENDOSCOPY;  Service: Endoscopy;  Laterality: N/A;   TONSILLECTOMY     TOTAL KNEE ARTHROPLASTY Left 01/08/2024   Procedure: LEFT TOTAL KNEE ARTHROPLASTYL;  Surgeon: Claiborne Crew, MD;  Location: [facility] ORS;  Service: Orthopedics;  Laterality: Left;   TOTAL THYROIDECTOMY  2005   goiter   Patient Active  Problem List   Diagnosis Date Noted   S/P total knee arthroplasty, left 01/08/2024   S/P total knee arthroplasty 01/08/2024   Atypical chest pain 11/23/2023   Preop cardiovascular exam 11/23/2023   Dyslipidemia 11/23/2023   Generalized lymphadenopathy 09/23/2021   B12 deficiency 09/23/2021   Fatigue 09/23/2021   Primary osteoarthritis of both knees 04/29/2021   Ankylosing spondylitis (HCC) 12/30/2018   Asthma in adult without complication 12/30/2018   Chronic diarrhea 12/30/2018   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   Interstitial cystitis 07/03/2016   Pelvic pain in female 07/03/2016   Chondromalacia of knee, 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   BMI 38.0-38.9,adult 02/14/2016   Polyarthralgia 02/14/2016   GERD (gastroesophageal reflux disease) 02/10/2014    PCP: Sheron Dixons, MD  REFERRING PROVIDER: Earnie Gola, PA-C  REFERRING DIAG: Q65.78 (ICD-10-CM) - Unilateral primary osteoarthritis, left knee   RATIONALE FOR EVALUATION AND TREATMENT: Rehabilitation  THERAPY DIAG: Acute pain of left knee  Stiffness of left knee, not elsewhere classified  Difficulty in walking, not elsewhere classified  Muscle weakness (generalized)  ONSET DATE: 01/08/24 (DOS, L TKA)  FOLLOW-UP APPT SCHEDULED WITH REFERRING PROVIDER: Yes ; next f/u in 1 week for post-op appointment  PERTINENT HISTORY: Patient is a 48 y.o. female with Hx of ankylosing spondylitis s/p L TKA (01/08/24). Pt's husband states they had to cancel last Thursday due to difficulty with getting out  of the home. She and her husband report notable back pain as well with attempting to get comfortable in bed. Pt has significant ecchymosis along posterolateral knee and posterolateral thigh following surgery.   Pt reports she is sometimes waiting too late for pain medication. Pt reports one episode of chest pain.   PAIN:   Pain Intensity: Present: 7/10, Best: 4/10, Worst: 10/10 Pain location: Medial knee, anterior thigh, posterior  thigh, groin  Pain quality: sharp  Swelling: Yes  Numbness/Tingling: Yes; mild numbness adjacent to knee Focal weakness or buckling: Yes; hyperextended once with rapid walking.  Aggravating factors: keeping knee straight, lifting LLE, trying to lie down with LLE Relieving factors: ice, medication  Prior level of function: Independent Occupational demands: CT tech, X-ray tech; OR and dental imaging Hobbies: going to lake - boating  Red flags: Negative for personal history of cancer, chills/fever, night sweats, nausea, vomiting, unexplained weight gain/loss, unrelenting pain  PRECAUTIONS: None  WEIGHT BEARING RESTRICTIONS: WBAT  FALLS: Has patient fallen in last 6 months? No  Living Environment Lives with: lives with their spouse; 62 y/o daughter Lives in: House/apartment No steps to get into home; one-level home, handicap accessible, shower seat  Patient Goals: Get back to work; able to walk at community-level with no AD; improved QoL    OBJECTIVE:     Gross Musculoskeletal Assessment Tremor: None Bulk: R quad atrophy Tone: Normal  GAIT: Distance walked: 50 ft Assistive device utilized: Environmental consultant - 2 wheeled Level of assistance: SBA Comments: Heavy lean onto walker, good terminal knee extension, decreased stance time on surgical LE, shortened step length bilaterally, sound heel strike   AROM AROM (Normal range in degrees) AROM 01/15/24  Hip Right Left  Flexion (125)    Extension (15)    Abduction (40)    Adduction     Internal Rotation (45)    External Rotation (45)        Knee    Flexion (135) 120 78  Extension (0) WNL -7      Ankle    Dorsiflexion (20) WNL WNL  Plantarflexion (50)    Inversion (35)    Eversion (15    (* = pain; Blank rows = not tested)  LE MMT: MMT (out of 5) Right 01/15/24 Left 01/15/24  Hip flexion 4- 4-  Hip extension    Hip abduction (seated) 4 4  Hip adduction    Hip internal rotation    Hip external rotation    Knee flexion 4+ 2   Knee extension 4- 3+  Ankle dorsiflexion    Ankle plantarflexion    Ankle inversion    Ankle eversion    (* = pain; Blank rows = not tested)  Palpation Location LEFT  RIGHT           Quadriceps 1   Medial Hamstrings 1   Lateral Hamstrings 1   Lateral Hamstring tendon 1   Medial Hamstring tendon 1   Quadriceps tendon 1   Patella 1   Patellar Tendon 1   Tibial Tuberosity 1   Medial joint line 2   Lateral joint line 1   MCL    LCL    Adductor Tubercle    Pes Anserine tendon    Infrapatellar fat pad    Fibular head    Popliteal fossa    (Blank rows = not tested) Graded on 0-4 scale (0 = no pain, 1 = pain, 2 = pain with wincing/grimacing/flinching, 3 = pain with withdrawal, 4 =  unwilling to allow palpation), (Blank rows = not tested)   VASCULAR Dorsalis pedis and posterior tibial pulses are palpable Homan's sign (-) No sign of acute DVT     TODAY'S TREATMENT:   SUBJECTIVE STATEMENT:   Patient reports 3-4/10 pain at arrival. Pt reports working diligently on her HEP. She reports ongoing challenges with getting rest at night. This has improved some with change in medications as advised per surgeon's office.     Manual Therapy - for symptom modulation, soft tissue sensitivity and mobility, joint mobility, ROM   L knee PROM flexion and extension as tolerated in supine x 5 minutes TF posterior mobilization gr II for pain control; 2 x 30 sec bouts Patellar mobilization; x 30 sec bouts, medial/lateral and superior/inferior  Knee ROM -2-98   Therapeutic Exercise - for improved soft tissue flexibility and extensibility as needed for ROM, improved strength as needed to improve performance of CKC activities/functional movements  NuStep, Level 0; 5 minutes for knee AAROM  Knee prop extension stretch with ice pack; x 3 minutes (L ankle propped on two pillows)  Heel slide with sheet around foot; x10, 5 sec hold  Quad set, with towel roll; 2x10, 5 sec hold Ball squeeze,  hooklying; 2 x 10, 3 sec hold  Supine hip abduction; 2 x 10, Green Tband SAQ; 2 x 10 LAQ; 2 x 10  PATIENT EDUCATION: Discussed current strategies for improved comfort at night and expected progress with stiffness, pain, and function with successive weeks.    *not today* Ambulate x 2 laps in gym with FWW with cueing for heel strike at initial contact and attaining full knee extension at terminal swing   PATIENT EDUCATION:  Education details: see above for patient education details Person educated: Patient Education method: Explanation, Demonstration, and Handouts Education comprehension: verbalized understanding and returned demonstration   HOME EXERCISE PROGRAM:  Pt continuing HEP given from hospital. Discussed continued work on propping L knee and maintaining in extended position for low-load, long duration stretch.   ASSESSMENT:  CLINICAL IMPRESSION: Terminal knee extension has markedly improved since IE. Bruising and edema have improved, though pt still is experiencing notable post-operative pain that is not uncommon at this time following TKA. She is doing well with her HEP, and her ROM is continuing to improve. Associated impairments include: L knee/thigh pain, decreased L knee ROM, decreased strength, post-op edema, and L knee stiffness. Pt will continue to benefit from skilled PT services to address deficits and improve function.   OBJECTIVE IMPAIRMENTS: Abnormal gait, decreased mobility, difficulty walking, decreased ROM, decreased strength, hypomobility, impaired flexibility, and pain.   ACTIVITY LIMITATIONS: bending, sitting, standing, squatting, sleeping, stairs, transfers, bed mobility, bathing, dressing, and locomotion level  PARTICIPATION LIMITATIONS: meal prep, cleaning, laundry, driving, shopping, community activity, occupation, and yard work  PERSONAL FACTORS: 3+ comorbidities: (ankylosing spondylitis, dyslipidemia, depression) are also affecting patient's  functional outcome.   REHAB POTENTIAL: Good  CLINICAL DECISION MAKING: Evolving/moderate complexity  EVALUATION COMPLEXITY: Moderate   GOALS: Goals reviewed with patient? Yes  SHORT TERM GOALS: Target date: 02/04/2024  Pt will be independent with HEP to improve strength and decrease knee pain to improve pain-free function at home and work. Baseline: 01/14/24: Baseline HEP initiated.  Goal status: INITIAL   LONG TERM GOALS: Target date: 03/13/2024  Pt will have L knee ROM 0-120 as needed for normalized motion required for transferring from surfaces of various height and to maintain symmetrical gait pattern Baseline: 01/15/24: L knee AROM in supine: -7-78. Goal  status: INITIAL  2.  Pt will decrease worst knee pain by at least 3 points on the NPRS in order to demonstrate clinically significant reduction in knee pain. Baseline: 01/15/24: 10/10 pain at worst.  Goal status: INITIAL  3.  Pt will decrease LEFS score by at least 9 points in order demonstrate clinically significant reduction in knee pain/disability.       Baseline: 01/15/24: 12/80 Goal status: INITIAL  4.  Pt will increase strength of tested LE musculature by at least 4+/5 or greater MMT grade in order to demonstrate improvement in strength and function  Baseline: 01/15/24: LLE MMT 2 to 4/5 (see chart above).  Goal status: INITIAL  5.  Pt will ambulate at community level (660 feet or greater in office) with no AD and symmetrical weightbearing on either LE without major deviations as needed for return to independent mobility and return to work Baseline: 01/15/24: LLE MMT 2 to 4/5 (see chart above).  Goal status: INITIAL   PLAN: PT FREQUENCY: 1-2x/week  PT DURATION: 8 weeks  PLANNED INTERVENTIONS: Therapeutic exercises, Therapeutic activity, Neuromuscular re-education, Balance training, Gait training, Patient/Family education, Self Care, Joint mobilization, Joint manipulation, Vestibular training, Canalith repositioning,  Orthotic/Fit training, DME instructions, Dry Needling, Electrical stimulation, Spinal manipulation, Spinal mobilization, Cryotherapy, Moist heat, Taping, Traction, Ultrasound, Ionotophoresis 4mg /ml Dexamethasone , Manual therapy, and Re-evaluation.  PLAN FOR NEXT SESSION: Begin with isometrics and gentle knee ROM in non-weightbearing positions, initiate cycling at 2 weeks and progressive weightbearing activity as tolerated.    Denese Finn, PT, DPT #[id number]  Aleatha Hunting, PT 01/24/2024, 3:24 PM

## 2024-01-25 ENCOUNTER — Encounter: Payer: 59 | Admitting: Internal Medicine

## 2024-01-25 ENCOUNTER — Encounter: Payer: Self-pay | Admitting: Physical Therapy

## 2024-01-29 ENCOUNTER — Encounter: Payer: Self-pay | Admitting: Physical Therapy

## 2024-01-29 ENCOUNTER — Ambulatory Visit: Admitting: Physical Therapy

## 2024-01-29 DIAGNOSIS — M25662 Stiffness of left knee, not elsewhere classified: Secondary | ICD-10-CM | POA: Diagnosis not present

## 2024-01-29 DIAGNOSIS — M25562 Pain in left knee: Secondary | ICD-10-CM | POA: Diagnosis not present

## 2024-01-29 DIAGNOSIS — M6281 Muscle weakness (generalized): Secondary | ICD-10-CM

## 2024-01-29 DIAGNOSIS — R262 Difficulty in walking, not elsewhere classified: Secondary | ICD-10-CM

## 2024-01-29 NOTE — Therapy (Signed)
 OUTPATIENT PHYSICAL THERAPY TREATMENT  Patient Name: Hailey [name] MRN: [id number] DOB:Feb 06, 1976, 48 y.o., female Today's Date: 01/29/2024  END OF SESSION:  PT End of Session - 01/29/24 1515     Visit Number 5    Number of Visits 17    Date for PT Re-Evaluation 03/11/24    PT Start Time 1518    PT Stop Time 1602    PT Time Calculation (min) 44 min    Activity Tolerance Patient tolerated treatment well    Behavior During Therapy [facility] for tasks assessed/performed             Past Medical History:  Diagnosis Date   Allergic state    Allergy    Ankylosing spondylitis (HCC)    followed by rheumatology Dr. Lydia Sams   Arthritis    Asthma    Cervical disc disease    Depression    Enlarged lymph nodes    GERD (gastroesophageal reflux disease)    H/O colonoscopy    HPV (human papilloma virus) infection    Hypothyroidism    Interstitial cystitis    LGSIL (low grade squamous intraepithelial dysplasia)    Paresthesia    Status post laparoscopic cholecystectomy 07/13/2016   Past Surgical History:  Procedure Laterality Date   BREAST BIOPSY Right 10/18/2015   bx/clip- neg   CHOLECYSTECTOMY N/A 07/07/2016   Procedure: LAPAROSCOPIC CHOLECYSTECTOMY;  Surgeon: Emmalene Hare, MD;  Location: [facility] ORS;  Service: General;  Laterality: N/A;   COLONOSCOPY WITH PROPOFOL  N/A 10/11/2015   Procedure: COLONOSCOPY WITH PROPOFOL ;  Surgeon: Deveron Fly, MD;  Location: [facility] ENDOSCOPY;  Service: Endoscopy;  Laterality: N/A;   ESOPHAGOGASTRODUODENOSCOPY (EGD) WITH PROPOFOL  N/A 10/11/2015   Procedure: ESOPHAGOGASTRODUODENOSCOPY (EGD) WITH PROPOFOL ;  Surgeon: Deveron Fly, MD;  Location: [facility] ENDOSCOPY;  Service: Endoscopy;  Laterality: N/A;   TONSILLECTOMY     TOTAL KNEE ARTHROPLASTY Left 01/08/2024   Procedure: LEFT TOTAL KNEE ARTHROPLASTYL;  Surgeon: Claiborne Crew, MD;  Location: [facility] ORS;  Service: Orthopedics;  Laterality: Left;   TOTAL THYROIDECTOMY  2005   goiter   Patient Active  Problem List   Diagnosis Date Noted   S/P total knee arthroplasty, left 01/08/2024   S/P total knee arthroplasty 01/08/2024   Atypical chest pain 11/23/2023   Preop cardiovascular exam 11/23/2023   Dyslipidemia 11/23/2023   Generalized lymphadenopathy 09/23/2021   B12 deficiency 09/23/2021   Fatigue 09/23/2021   Primary osteoarthritis of both knees 04/29/2021   Ankylosing spondylitis (HCC) 12/30/2018   Asthma in adult without complication 12/30/2018   Chronic diarrhea 12/30/2018   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   Interstitial cystitis 07/03/2016   Pelvic pain in female 07/03/2016   Chondromalacia of knee, 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   BMI 38.0-38.9,adult 02/14/2016   Polyarthralgia 02/14/2016   GERD (gastroesophageal reflux disease) 02/10/2014    PCP: Sheron Dixons, MD  REFERRING PROVIDER: Earnie Gola, PA-C  REFERRING DIAG: W29.56 (ICD-10-CM) - Unilateral primary osteoarthritis, left knee   RATIONALE FOR EVALUATION AND TREATMENT: Rehabilitation  THERAPY DIAG: Acute pain of left knee  Stiffness of left knee, not elsewhere classified  Difficulty in walking, not elsewhere classified  Muscle weakness (generalized)  ONSET DATE: 01/08/24 (DOS, L TKA)  FOLLOW-UP APPT SCHEDULED WITH REFERRING PROVIDER: Yes ; next f/u in 1 week for post-op appointment  PERTINENT HISTORY: Patient is a 48 y.o. female with Hx of ankylosing spondylitis s/p L TKA (01/08/24). Pt's husband states they had to cancel last Thursday due to difficulty with getting out of the home.  She and her husband report notable back pain as well with attempting to get comfortable in bed. Pt has significant ecchymosis along posterolateral knee and posterolateral thigh following surgery.   Pt reports she is sometimes waiting too late for pain medication. Pt reports one episode of chest pain.   PAIN:   Pain Intensity: Present: 7/10, Best: 4/10, Worst: 10/10 Pain location: Medial knee, anterior thigh, posterior  thigh, groin  Pain quality: sharp  Swelling: Yes  Numbness/Tingling: Yes; mild numbness adjacent to knee Focal weakness or buckling: Yes; hyperextended once with rapid walking.  Aggravating factors: keeping knee straight, lifting LLE, trying to lie down with LLE Relieving factors: ice, medication  Prior level of function: Independent Occupational demands: CT tech, X-ray tech; OR and dental imaging Hobbies: going to lake - boating  Red flags: Negative for personal history of cancer, chills/fever, night sweats, nausea, vomiting, unexplained weight gain/loss, unrelenting pain  PRECAUTIONS: None  WEIGHT BEARING RESTRICTIONS: WBAT  FALLS: Has patient fallen in last 6 months? No  Living Environment Lives with: lives with their spouse; 11 y/o daughter Lives in: House/apartment No steps to get into home; one-level home, handicap accessible, shower seat  Patient Goals: Get back to work; able to walk at community-level with no AD; improved QoL    OBJECTIVE:     Gross Musculoskeletal Assessment Tremor: None Bulk: R quad atrophy Tone: Normal  GAIT: Distance walked: 50 ft Assistive device utilized: Environmental consultant - 2 wheeled Level of assistance: SBA Comments: Heavy lean onto walker, good terminal knee extension, decreased stance time on surgical LE, shortened step length bilaterally, sound heel strike   AROM AROM (Normal range in degrees) AROM 01/15/24  Hip Right Left  Flexion (125)    Extension (15)    Abduction (40)    Adduction     Internal Rotation (45)    External Rotation (45)        Knee    Flexion (135) 120 78  Extension (0) WNL -7      Ankle    Dorsiflexion (20) WNL WNL  Plantarflexion (50)    Inversion (35)    Eversion (15    (* = pain; Blank rows = not tested)  LE MMT: MMT (out of 5) Right 01/15/24 Left 01/15/24  Hip flexion 4- 4-  Hip extension    Hip abduction (seated) 4 4  Hip adduction    Hip internal rotation    Hip external rotation    Knee flexion 4+ 2   Knee extension 4- 3+  Ankle dorsiflexion    Ankle plantarflexion    Ankle inversion    Ankle eversion    (* = pain; Blank rows = not tested)  Palpation Location LEFT  RIGHT           Quadriceps 1   Medial Hamstrings 1   Lateral Hamstrings 1   Lateral Hamstring tendon 1   Medial Hamstring tendon 1   Quadriceps tendon 1   Patella 1   Patellar Tendon 1   Tibial Tuberosity 1   Medial joint line 2   Lateral joint line 1   MCL    LCL    Adductor Tubercle    Pes Anserine tendon    Infrapatellar fat pad    Fibular head    Popliteal fossa    (Blank rows = not tested) Graded on 0-4 scale (0 = no pain, 1 = pain, 2 = pain with wincing/grimacing/flinching, 3 = pain with withdrawal, 4 = unwilling to allow  palpation), (Blank rows = not tested)   VASCULAR Dorsalis pedis and posterior tibial pulses are palpable Homan's sign (-) No sign of acute DVT     TODAY'S TREATMENT:   SUBJECTIVE STATEMENT:   Patient reports 3-4/10 pain at arrival. Pt reports most difficulty with sleeping/at night trying to get comfortable. She reports compliance with HEP. Pt states that swelling has been improving.     Manual Therapy - for symptom modulation, soft tissue sensitivity and mobility, joint mobility, ROM   L knee PROM flexion and extension as tolerated in supine x 8 minutes TF posterior mobilization gr II for pain control; 2 x 30 sec bouts Patellar mobilization; x 30 sec bouts, medial/lateral and superior/inferior  Knee ROM -2-98   Therapeutic Exercise - for improved soft tissue flexibility and extensibility as needed for ROM, improved strength as needed to improve performance of CKC activities/functional movements  NuStep, Level 3; 5 minutes for knee AAROM  Quad set, with towel roll; 2x10, 5 sec hold SAQ; 2 x 10 SLR; 2 x 10 LAQ; 2 x 10  Ambulate laps in gym with verbal cueing for heel to toe progression and full stance time on surgical LE; x 2 laps  Dynamic march in // bars; x 3  D/B length of bars  PATIENT EDUCATION: Discussed following up with referring office for medical recommendations for improving ability to get to sleep.    *not today* Supine hip abduction; 2 x 10, [employer] squeeze, hooklying; 2 x 10, 3 sec hold  Heel slide with sheet around foot; x10, 5 sec hold  Knee prop extension stretch with ice pack; x 3 minutes (L ankle propped on two pillows) Ambulate x 2 laps in gym with FWW with cueing for heel strike at initial contact and attaining full knee extension at terminal swing   PATIENT EDUCATION:  Education details: see above for patient education details Person educated: Patient Education method: Explanation, Demonstration, and Handouts Education comprehension: verbalized understanding and returned demonstration   HOME EXERCISE PROGRAM:  Pt continuing HEP given from hospital. Discussed continued work on propping L knee and maintaining in extended position for low-load, long duration stretch.   ASSESSMENT:  CLINICAL IMPRESSION: Incision is healing well with no notable drainage and being largely closed at this time. Patient has [facility] knee extension and remaining deficit with knee flexion. Pt will benefit from further work on knee flexion and extension ROM to improve stiffness and obtain full motion needed for transferring and self-care ADLs. Pt is safe with home-level gait without AD, but she does present with compensated Trendelenburg gait pattern; stance time is grossly symmetrical with gait trials today. Associated impairments include: L knee/thigh pain, decreased L knee ROM, decreased strength, post-op edema, and L knee stiffness. Pt will continue to benefit from skilled PT services to address deficits and improve function.   OBJECTIVE IMPAIRMENTS: Abnormal gait, decreased mobility, difficulty walking, decreased ROM, decreased strength, hypomobility, impaired flexibility, and pain.   ACTIVITY LIMITATIONS: bending, sitting, standing,  squatting, sleeping, stairs, transfers, bed mobility, bathing, dressing, and locomotion level  PARTICIPATION LIMITATIONS: meal prep, cleaning, laundry, driving, shopping, community activity, occupation, and yard work  PERSONAL FACTORS: 3+ comorbidities: (ankylosing spondylitis, dyslipidemia, depression) are also affecting patient's functional outcome.   REHAB POTENTIAL: Good  CLINICAL DECISION MAKING: Evolving/moderate complexity  EVALUATION COMPLEXITY: Moderate   GOALS: Goals reviewed with patient? Yes  SHORT TERM GOALS: Target date: 02/04/2024  Pt will be independent with HEP to improve strength and decrease knee pain to improve  pain-free function at home and work. Baseline: 01/14/24: Baseline HEP initiated.  Goal status: INITIAL   LONG TERM GOALS: Target date: 03/13/2024  Pt will have L knee ROM 0-120 as needed for normalized motion required for transferring from surfaces of various height and to maintain symmetrical gait pattern Baseline: 01/15/24: L knee AROM in supine: -7-78. Goal status: INITIAL  2.  Pt will decrease worst knee pain by at least 3 points on the NPRS in order to demonstrate clinically significant reduction in knee pain. Baseline: 01/15/24: 10/10 pain at worst.  Goal status: INITIAL  3.  Pt will decrease LEFS score by at least 9 points in order demonstrate clinically significant reduction in knee pain/disability.       Baseline: 01/15/24: 12/80 Goal status: INITIAL  4.  Pt will increase strength of tested LE musculature by at least 4+/5 or greater MMT grade in order to demonstrate improvement in strength and function  Baseline: 01/15/24: LLE MMT 2 to 4/5 (see chart above).  Goal status: INITIAL  5.  Pt will ambulate at community level (660 feet or greater in office) with no AD and symmetrical weightbearing on either LE without major deviations as needed for return to independent mobility and return to work Baseline: 01/15/24: LLE MMT 2 to 4/5 (see chart above).  Goal  status: INITIAL   PLAN: PT FREQUENCY: 1-2x/week  PT DURATION: 8 weeks  PLANNED INTERVENTIONS: Therapeutic exercises, Therapeutic activity, Neuromuscular re-education, Balance training, Gait training, Patient/Family education, Self Care, Joint mobilization, Joint manipulation, Vestibular training, Canalith repositioning, Orthotic/Fit training, DME instructions, Dry Needling, Electrical stimulation, Spinal manipulation, Spinal mobilization, Cryotherapy, Moist heat, Taping, Traction, Ultrasound, Ionotophoresis 4mg /ml Dexamethasone , Manual therapy, and Re-evaluation.  PLAN FOR NEXT SESSION: Continue with restoration of knee ROM, quad strengthening and progression of SLR, progressively incorporating more CKC loading and functional activities   Denese Finn, PT, DPT #[id number]  Aleatha Hunting, PT 01/29/2024, 4:14 PM

## 2024-01-31 ENCOUNTER — Encounter: Admitting: Physical Therapy

## 2024-02-01 ENCOUNTER — Telehealth: Payer: Self-pay | Admitting: Internal Medicine

## 2024-02-01 NOTE — Telephone Encounter (Signed)
 Please review and advise.

## 2024-02-01 NOTE — Telephone Encounter (Signed)
Sent pt message on Mychart.  KP

## 2024-02-01 NOTE — Telephone Encounter (Signed)
 Called pt left VM to call back.  KP

## 2024-02-01 NOTE — Telephone Encounter (Signed)
 Copied from CRM [id number]. Topic: Appointments - Appointment Scheduling >> Feb 01, 2024 10:02 AM Leory Rands wrote: Patient is calling to schedule Presurgical Clearance appt for Right Knee surgery. Patient was last in the office on 10/23/23 for Left Knee Presurgical  clearance. Patient would like to know does Dr. Gala Jubilee need to see her. Or can she complete paper work. Appointment scheduled for 02/05/24 if necessary

## 2024-02-05 ENCOUNTER — Encounter: Payer: Self-pay | Admitting: Physical Therapy

## 2024-02-05 ENCOUNTER — Ambulatory Visit: Admitting: Physical Therapy

## 2024-02-05 ENCOUNTER — Ambulatory Visit: Admitting: Internal Medicine

## 2024-02-05 DIAGNOSIS — M25562 Pain in left knee: Secondary | ICD-10-CM

## 2024-02-05 DIAGNOSIS — R262 Difficulty in walking, not elsewhere classified: Secondary | ICD-10-CM

## 2024-02-05 DIAGNOSIS — M6281 Muscle weakness (generalized): Secondary | ICD-10-CM

## 2024-02-05 DIAGNOSIS — M25662 Stiffness of left knee, not elsewhere classified: Secondary | ICD-10-CM | POA: Diagnosis not present

## 2024-02-05 NOTE — Therapy (Signed)
 OUTPATIENT PHYSICAL THERAPY TREATMENT  Patient Name: Hailey [name] MRN: [id number] DOB:1976/04/02, 48 y.o., female Today's Date: 02/05/2024  END OF SESSION:  PT End of Session - 02/05/24 1513     Visit Number 6    Number of Visits 17    Date for PT Re-Evaluation 03/11/24    PT Start Time 1518    PT Stop Time 1600    PT Time Calculation (min) 42 min    Activity Tolerance Patient tolerated treatment well    Behavior During Therapy [facility] for tasks assessed/performed              Past Medical History:  Diagnosis Date   Allergic state    Allergy    Ankylosing spondylitis (HCC)    followed by rheumatology Dr. Lydia Sams   Arthritis    Asthma    Cervical disc disease    Depression    Enlarged lymph nodes    GERD (gastroesophageal reflux disease)    H/O colonoscopy    HPV (human papilloma virus) infection    Hypothyroidism    Interstitial cystitis    LGSIL (low grade squamous intraepithelial dysplasia)    Paresthesia    Status post laparoscopic cholecystectomy 07/13/2016   Past Surgical History:  Procedure Laterality Date   BREAST BIOPSY Right 10/18/2015   bx/clip- neg   CHOLECYSTECTOMY N/A 07/07/2016   Procedure: LAPAROSCOPIC CHOLECYSTECTOMY;  Surgeon: Emmalene Hare, MD;  Location: [facility] ORS;  Service: General;  Laterality: N/A;   COLONOSCOPY WITH PROPOFOL  N/A 10/11/2015   Procedure: COLONOSCOPY WITH PROPOFOL ;  Surgeon: Deveron Fly, MD;  Location: [facility] ENDOSCOPY;  Service: Endoscopy;  Laterality: N/A;   ESOPHAGOGASTRODUODENOSCOPY (EGD) WITH PROPOFOL  N/A 10/11/2015   Procedure: ESOPHAGOGASTRODUODENOSCOPY (EGD) WITH PROPOFOL ;  Surgeon: Deveron Fly, MD;  Location: [facility] ENDOSCOPY;  Service: Endoscopy;  Laterality: N/A;   TONSILLECTOMY     TOTAL KNEE ARTHROPLASTY Left 01/08/2024   Procedure: LEFT TOTAL KNEE ARTHROPLASTYL;  Surgeon: Claiborne Crew, MD;  Location: [facility] ORS;  Service: Orthopedics;  Laterality: Left;   TOTAL THYROIDECTOMY  2005   goiter   Patient Active  Problem List   Diagnosis Date Noted   S/P total knee arthroplasty, left 01/08/2024   S/P total knee arthroplasty 01/08/2024   Atypical chest pain 11/23/2023   Preop cardiovascular exam 11/23/2023   Dyslipidemia 11/23/2023   Generalized lymphadenopathy 09/23/2021   B12 deficiency 09/23/2021   Fatigue 09/23/2021   Primary osteoarthritis of both knees 04/29/2021   Ankylosing spondylitis (HCC) 12/30/2018   Asthma in adult without complication 12/30/2018   Chronic diarrhea 12/30/2018   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   Interstitial cystitis 07/03/2016   Pelvic pain in female 07/03/2016   Chondromalacia of knee, 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   BMI 38.0-38.9,adult 02/14/2016   Polyarthralgia 02/14/2016   GERD (gastroesophageal reflux disease) 02/10/2014    PCP: Sheron Dixons, MD  REFERRING PROVIDER: Earnie Gola, PA-C  REFERRING DIAG: N82.95 (ICD-10-CM) - Unilateral primary osteoarthritis, left knee   RATIONALE FOR EVALUATION AND TREATMENT: Rehabilitation  THERAPY DIAG: Acute pain of left knee  Stiffness of left knee, not elsewhere classified  Difficulty in walking, not elsewhere classified  Muscle weakness (generalized)  ONSET DATE: 01/08/24 (DOS, L TKA)  FOLLOW-UP APPT SCHEDULED WITH REFERRING PROVIDER: Yes ; next f/u in 1 week for post-op appointment  PERTINENT HISTORY: Patient is a 48 y.o. female with Hx of ankylosing spondylitis s/p L TKA (01/08/24). Pt's husband states they had to cancel last Thursday due to difficulty with getting out of the  home. She and her husband report notable back pain as well with attempting to get comfortable in bed. Pt has significant ecchymosis along posterolateral knee and posterolateral thigh following surgery.   Pt reports she is sometimes waiting too late for pain medication. Pt reports one episode of chest pain.   PAIN:   Pain Intensity: Present: 7/10, Best: 4/10, Worst: 10/10 Pain location: Medial knee, anterior thigh, posterior  thigh, groin  Pain quality: sharp  Swelling: Yes  Numbness/Tingling: Yes; mild numbness adjacent to knee Focal weakness or buckling: Yes; hyperextended once with rapid walking.  Aggravating factors: keeping knee straight, lifting LLE, trying to lie down with LLE Relieving factors: ice, medication  Prior level of function: Independent Occupational demands: CT tech, X-ray tech; OR and dental imaging Hobbies: going to lake - boating  Red flags: Negative for personal history of cancer, chills/fever, night sweats, nausea, vomiting, unexplained weight gain/loss, unrelenting pain  PRECAUTIONS: None  WEIGHT BEARING RESTRICTIONS: WBAT  FALLS: Has patient fallen in last 6 months? No  Living Environment Lives with: lives with their spouse; 55 y/o daughter Lives in: House/apartment No steps to get into home; one-level home, handicap accessible, shower seat  Patient Goals: Get back to work; able to walk at community-level with no AD; improved QoL    OBJECTIVE:     Gross Musculoskeletal Assessment Tremor: None Bulk: R quad atrophy Tone: Normal  GAIT: Distance walked: 50 ft Assistive device utilized: Environmental consultant - 2 wheeled Level of assistance: SBA Comments: Heavy lean onto walker, good terminal knee extension, decreased stance time on surgical LE, shortened step length bilaterally, sound heel strike   AROM AROM (Normal range in degrees) AROM 01/15/24  Hip Right Left  Flexion (125)    Extension (15)    Abduction (40)    Adduction     Internal Rotation (45)    External Rotation (45)        Knee    Flexion (135) 120 78  Extension (0) WNL -7      Ankle    Dorsiflexion (20) WNL WNL  Plantarflexion (50)    Inversion (35)    Eversion (15    (* = pain; Blank rows = not tested)  LE MMT: MMT (out of 5) Right 01/15/24 Left 01/15/24  Hip flexion 4- 4-  Hip extension    Hip abduction (seated) 4 4  Hip adduction    Hip internal rotation    Hip external rotation    Knee flexion 4+ 2   Knee extension 4- 3+  Ankle dorsiflexion    Ankle plantarflexion    Ankle inversion    Ankle eversion    (* = pain; Blank rows = not tested)  Palpation Location LEFT  RIGHT           Quadriceps 1   Medial Hamstrings 1   Lateral Hamstrings 1   Lateral Hamstring tendon 1   Medial Hamstring tendon 1   Quadriceps tendon 1   Patella 1   Patellar Tendon 1   Tibial Tuberosity 1   Medial joint line 2   Lateral joint line 1   MCL    LCL    Adductor Tubercle    Pes Anserine tendon    Infrapatellar fat pad    Fibular head    Popliteal fossa    (Blank rows = not tested) Graded on 0-4 scale (0 = no pain, 1 = pain, 2 = pain with wincing/grimacing/flinching, 3 = pain with withdrawal, 4 = unwilling to  allow palpation), (Blank rows = not tested)   VASCULAR Dorsalis pedis and posterior tibial pulses are palpable Homan's sign (-) No sign of acute DVT     TODAY'S TREATMENT:   SUBJECTIVE STATEMENT:   Patient reports 2-3/10 pain at arrival. She reports sensitivity around the joint line and incisional region - she does not like bed sheets touching the area. Patient reports some stiffness in AM, and she reports improvement in sensation of stiffness that is improved with exercises. Pt currently is planning to have other knee replaced on 02/19/24.     Manual Therapy - for symptom modulation, soft tissue sensitivity and mobility, joint mobility, ROM   L knee PROM flexion and extension as tolerated in supine x 8 minutes TF posterior mobilization gr II for pain control; 2 x 30 sec bouts Patellar mobilization; x 30 sec bouts, medial/lateral and superior/inferior  Knee ROM -2-108   Therapeutic Exercise - for improved soft tissue flexibility and extensibility as needed for ROM, improved strength as needed to improve performance of CKC activities/functional movements  NuStep, Level 3; 5 minutes for knee AAROM  Heel slide; x10, 10 sec  SLR; 1 x 10   Therapeutic Activities - performance  of CKC tasks and weight shifting drills to emulate demands of functional activities/ADLs  Sit to stand; 2 x 10  Ambulate laps in gym with SPC; x 3 laps with reciprocal gait pattern  -PT demo and cane adjustment for height  Anterior toe tapping; 6-inch step; 20x alt  PATIENT EDUCATION: HEP update; new [employer] handout given.    *not today* Quad set, with towel roll; 2x10, 5 sec hold Supine hip abduction; 2 x 10, [employer] squeeze, hooklying; 2 x 10, 3 sec hold  Heel slide with sheet around foot; x10, 5 sec hold  Knee prop extension stretch with ice pack; x 3 minutes (L ankle propped on two pillows) Ambulate x 2 laps in gym with FWW with cueing for heel strike at initial contact and attaining full knee extension at terminal swing   PATIENT EDUCATION:  Education details: see above for patient education details Person educated: Patient Education method: Explanation, Demonstration, and Handouts Education comprehension: verbalized understanding and returned demonstration   HOME EXERCISE PROGRAM:  Pt continuing HEP given from hospital. Discussed continued work on propping L knee and maintaining in extended position for low-load, long duration stretch.   Access Code: [email or web address] URL: [email or web address] Date: 02/05/2024 Prepared by: Denese Finn  Exercises - Sit to Stand Without Arm Support  - 1 x daily - 7 x weekly - 2 sets - 10 reps - Alternating Step Taps with Counter Support  - 1 x daily - 7 x weekly - 2 sets - 10 reps   ASSESSMENT:  CLINICAL IMPRESSION: Pt fortunately is reporting better sleep quality and pain overall. She has reduced edema and ongoing improvements with L knee ROM. Pt is able to ambulate without AD for household distance, but she does have some gait deviations that are improved with use of SPC. She has good SLR without quad lag, and she is able to readily perform transfers without major limitation. Pt is currently planning for R TKA on  02/19/24; we will need to ensure functional ROM and recovery of LLE strength needed for post-op recovery for opposite side. Pt has remaining impairments in: L knee/thigh pain, decreased L knee ROM, decreased strength, post-op edema, and L knee stiffness. Pt will continue to benefit from skilled PT services to address deficits and improve function.  OBJECTIVE IMPAIRMENTS: Abnormal gait, decreased mobility, difficulty walking, decreased ROM, decreased strength, hypomobility, impaired flexibility, and pain.   ACTIVITY LIMITATIONS: bending, sitting, standing, squatting, sleeping, stairs, transfers, bed mobility, bathing, dressing, and locomotion level  PARTICIPATION LIMITATIONS: meal prep, cleaning, laundry, driving, shopping, community activity, occupation, and yard work  PERSONAL FACTORS: 3+ comorbidities: (ankylosing spondylitis, dyslipidemia, depression) are also affecting patient's functional outcome.   REHAB POTENTIAL: Good  CLINICAL DECISION MAKING: Evolving/moderate complexity  EVALUATION COMPLEXITY: Moderate   GOALS: Goals reviewed with patient? Yes  SHORT TERM GOALS: Target date: 02/04/2024  Pt will be independent with HEP to improve strength and decrease knee pain to improve pain-free function at home and work. Baseline: 01/14/24: Baseline HEP initiated.  Goal status: INITIAL   LONG TERM GOALS: Target date: 03/13/2024  Pt will have L knee ROM 0-120 as needed for normalized motion required for transferring from surfaces of various height and to maintain symmetrical gait pattern Baseline: 01/15/24: L knee AROM in supine: -7-78. Goal status: INITIAL  2.  Pt will decrease worst knee pain by at least 3 points on the NPRS in order to demonstrate clinically significant reduction in knee pain. Baseline: 01/15/24: 10/10 pain at worst.  Goal status: INITIAL  3.  Pt will decrease LEFS score by at least 9 points in order demonstrate clinically significant reduction in knee pain/disability.        Baseline: 01/15/24: 12/80 Goal status: INITIAL  4.  Pt will increase strength of tested LE musculature by at least 4+/5 or greater MMT grade in order to demonstrate improvement in strength and function  Baseline: 01/15/24: LLE MMT 2 to 4/5 (see chart above).  Goal status: INITIAL  5.  Pt will ambulate at community level (660 feet or greater in office) with no AD and symmetrical weightbearing on either LE without major deviations as needed for return to independent mobility and return to work Baseline: 01/15/24: LLE MMT 2 to 4/5 (see chart above).  Goal status: INITIAL   PLAN: PT FREQUENCY: 1-2x/week  PT DURATION: 8 weeks  PLANNED INTERVENTIONS: Therapeutic exercises, Therapeutic activity, Neuromuscular re-education, Balance training, Gait training, Patient/Family education, Self Care, Joint mobilization, Joint manipulation, Vestibular training, Canalith repositioning, Orthotic/Fit training, DME instructions, Dry Needling, Electrical stimulation, Spinal manipulation, Spinal mobilization, Cryotherapy, Moist heat, Taping, Traction, Ultrasound, Ionotophoresis 4mg /ml Dexamethasone , Manual therapy, and Re-evaluation.  PLAN FOR NEXT SESSION: Continue with restoration of knee ROM, quad strengthening and progression of SLR, progressively incorporating more CKC loading and functional activities   Denese Finn, PT, DPT #[id number]  Aleatha Hunting, PT 02/05/2024, 5:22 PM

## 2024-02-07 ENCOUNTER — Ambulatory Visit: Admitting: Physical Therapy

## 2024-02-07 ENCOUNTER — Encounter: Payer: Self-pay | Admitting: Physical Therapy

## 2024-02-07 ENCOUNTER — Other Ambulatory Visit: Payer: Self-pay

## 2024-02-07 DIAGNOSIS — M25562 Pain in left knee: Secondary | ICD-10-CM | POA: Diagnosis not present

## 2024-02-07 DIAGNOSIS — M25662 Stiffness of left knee, not elsewhere classified: Secondary | ICD-10-CM

## 2024-02-07 DIAGNOSIS — M6281 Muscle weakness (generalized): Secondary | ICD-10-CM

## 2024-02-07 DIAGNOSIS — R262 Difficulty in walking, not elsewhere classified: Secondary | ICD-10-CM | POA: Diagnosis not present

## 2024-02-07 MED ORDER — OXYCODONE HCL 5 MG PO TABS
10.0000 mg | ORAL_TABLET | Freq: Four times a day (QID) | ORAL | 0 refills | Status: DC | PRN
  Filled 2024-02-07: qty 20, 3d supply, fill #0

## 2024-02-07 MED ORDER — HYDROXYZINE HCL 25 MG PO TABS
25.0000 mg | ORAL_TABLET | Freq: Every evening | ORAL | 0 refills | Status: DC | PRN
  Filled 2024-02-07: qty 30, 30d supply, fill #0

## 2024-02-07 NOTE — Therapy (Signed)
 OUTPATIENT PHYSICAL THERAPY TREATMENT  Patient Name: Hailey [name] MRN: [id number] DOB:[birth year], 48 y.o., female Today's Date: 02/07/2024  END OF SESSION:  PT End of Session - 02/07/24 1519     Visit Number 7    Number of Visits 17    Date for PT Re-Evaluation 03/11/24    PT Start Time 1519    PT Stop Time 1601    PT Time Calculation (min) 42 min    Activity Tolerance Patient tolerated treatment well    Behavior During Therapy [facility] for tasks assessed/performed             Past Medical History:  Diagnosis Date   Allergic state    Allergy    Ankylosing spondylitis (HCC)    followed by rheumatology Dr. Lydia Sams   Arthritis    Asthma    Cervical disc disease    Depression    Enlarged lymph nodes    GERD (gastroesophageal reflux disease)    H/O colonoscopy    HPV (human papilloma virus) infection    Hypothyroidism    Interstitial cystitis    LGSIL (low grade squamous intraepithelial dysplasia)    Paresthesia    Status post laparoscopic cholecystectomy 07/13/2016   Past Surgical History:  Procedure Laterality Date   BREAST BIOPSY Right 10/18/2015   bx/clip- neg   CHOLECYSTECTOMY N/A 07/07/2016   Procedure: LAPAROSCOPIC CHOLECYSTECTOMY;  Surgeon: Emmalene Hare, MD;  Location: [facility] ORS;  Service: General;  Laterality: N/A;   COLONOSCOPY WITH PROPOFOL  N/A 10/11/2015   Procedure: COLONOSCOPY WITH PROPOFOL ;  Surgeon: Deveron Fly, MD;  Location[facility] ENDOSCOPY;  Service: Endoscopy;  Laterality: N/A;   ESOPHAGOGASTRODUODENOSCOPY (EGD) WITH PROPOFOL  N/A 10/11/2015   Procedure: ESOPHAGOGASTRODUODENOSCOPY (EGD) WITH PROPOFOL ;  Surgeon: Deveron Fly, MD;  Location: [facility] ENDOSCOPY;  Service: Endoscopy;  Laterality: N/A;   TONSILLECTOMY     TOTAL KNEE ARTHROPLASTY Left 01/08/2024   Procedure: LEFT TOTAL KNEE ARTHROPLASTYL;  Surgeon: Claiborne Crew, MD;  Location: [facility] ORS;  Service: Orthopedics;  Laterality: Left;   TOTAL THYROIDECTOMY  2005   goiter   Patient Active  Problem List   Diagnosis Date Noted   S/P total knee arthroplasty, left 01/08/2024   S/P total knee arthroplasty 01/08/2024   Atypical chest pain 11/23/2023   Preop cardiovascular exam 11/23/2023   Dyslipidemia 11/23/2023   Generalized lymphadenopathy 09/23/2021   B12 deficiency 09/23/2021   Fatigue 09/23/2021   Primary osteoarthritis of both knees 04/29/2021   Ankylosing spondylitis (HCC) 12/30/2018   Asthma in adult without complication 12/30/2018   Chronic diarrhea 12/30/2018   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   Interstitial cystitis 07/03/2016   Pelvic pain in female 07/03/2016   Chondromalacia of knee, 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   BMI 38.0-38.9,adult 02/14/2016   Polyarthralgia 02/14/2016   GERD (gastroesophageal reflux disease) 02/10/2014    PCP: Sheron Dixons, MD  REFERRING PROVIDER: Earnie Gola, PA-C  REFERRING DIAG: W09.81 (ICD-10-CM) - Unilateral primary osteoarthritis, left knee   RATIONALE FOR EVALUATION AND TREATMENT: Rehabilitation  THERAPY DIAG: Acute pain of left knee  Stiffness of left knee, not elsewhere classified  Difficulty in walking, not elsewhere classified  Muscle weakness (generalized)  ONSET DATE: 01/08/24 (DOS, L TKA)  FOLLOW-UP APPT SCHEDULED WITH REFERRING PROVIDER: Yes ; next f/u in 1 week for post-op appointment  PERTINENT HISTORY: Patient is a 48 y.o. female with Hx of ankylosing spondylitis s/p L TKA (01/08/24). Pt's husband states they had to cancel last Thursday due to difficulty with getting out of the home.  She and her husband report notable back pain as well with attempting to get comfortable in bed. Pt has significant ecchymosis along posterolateral knee and posterolateral thigh following surgery.   Pt reports she is sometimes waiting too late for pain medication. Pt reports one episode of chest pain.   PAIN:   Pain Intensity: Present: 7/10, Best: 4/10, Worst: 10/10 Pain location: Medial knee, anterior thigh, posterior  thigh, groin  Pain quality: sharp  Swelling: Yes  Numbness/Tingling: Yes; mild numbness adjacent to knee Focal weakness or buckling: Yes; hyperextended once with rapid walking.  Aggravating factors: keeping knee straight, lifting LLE, trying to lie down with LLE Relieving factors: ice, medication  Prior level of function: Independent Occupational demands: CT tech, X-ray tech; OR and dental imaging Hobbies: going to lake - boating  Red flags: Negative for personal history of cancer, chills/fever, night sweats, nausea, vomiting, unexplained weight gain/loss, unrelenting pain  PRECAUTIONS: None  WEIGHT BEARING RESTRICTIONS: WBAT  FALLS: Has patient fallen in last 6 months? No  Living Environment Lives with: lives with their spouse; 72 y/o daughter Lives in: House/apartment No steps to get into home; one-level home, handicap accessible, shower seat  Patient Goals: Get back to work; able to walk at community-level with no AD; improved QoL    OBJECTIVE:     Gross Musculoskeletal Assessment Tremor: None Bulk: R quad atrophy Tone: Normal  GAIT: Distance walked: 50 ft Assistive device utilized: Environmental consultant - 2 wheeled Level of assistance: SBA Comments: Heavy lean onto walker, good terminal knee extension, decreased stance time on surgical LE, shortened step length bilaterally, sound heel strike   AROM AROM (Normal range in degrees) AROM 01/15/24  Hip Right Left  Flexion (125)    Extension (15)    Abduction (40)    Adduction     Internal Rotation (45)    External Rotation (45)        Knee    Flexion (135) 120 78  Extension (0) WNL -7      Ankle    Dorsiflexion (20) WNL WNL  Plantarflexion (50)    Inversion (35)    Eversion (15    (* = pain; Blank rows = not tested)  LE MMT: MMT (out of 5) Right 01/15/24 Left 01/15/24  Hip flexion 4- 4-  Hip extension    Hip abduction (seated) 4 4  Hip adduction    Hip internal rotation    Hip external rotation    Knee flexion 4+ 2   Knee extension 4- 3+  Ankle dorsiflexion    Ankle plantarflexion    Ankle inversion    Ankle eversion    (* = pain; Blank rows = not tested)  Palpation Location LEFT  RIGHT           Quadriceps 1   Medial Hamstrings 1   Lateral Hamstrings 1   Lateral Hamstring tendon 1   Medial Hamstring tendon 1   Quadriceps tendon 1   Patella 1   Patellar Tendon 1   Tibial Tuberosity 1   Medial joint line 2   Lateral joint line 1   MCL    LCL    Adductor Tubercle    Pes Anserine tendon    Infrapatellar fat pad    Fibular head    Popliteal fossa    (Blank rows = not tested) Graded on 0-4 scale (0 = no pain, 1 = pain, 2 = pain with wincing/grimacing/flinching, 3 = pain with withdrawal, 4 = unwilling to allow  palpation), (Blank rows = not tested)   VASCULAR Dorsalis pedis and posterior tibial pulses are palpable Homan's sign (-) No sign of acute DVT     TODAY'S TREATMENT:   SUBJECTIVE STATEMENT:   Patient reports 4-5/10 pain at arrival. Patient reports pain and difficulty with evening pain after mobilizing knee on Tuesday. Patient reports fair functional mobility, but some pain with sit to stand and car transfers.     Manual Therapy - for symptom modulation, soft tissue sensitivity and mobility, joint mobility, ROM   L knee PROM flexion and extension as tolerated in supine x 8 minutes TF posterior mobilization gr II for pain control; 2 x 30 sec bouts Patellar mobilization; x 30 sec bouts, medial/lateral and superior/inferior  Knee ROM -2-105   Therapeutic Exercise - for improved soft tissue flexibility and extensibility as needed for ROM, improved strength as needed to improve performance of CKC activities/functional movements  NuStep, Level 4; 6 minutes for knee AAROM  Knee extension propped stretch on 2 pillows, pt lying supine; cold pack around knee; x 2 minutes  Heel slide; x10, 10 sec  Knee flexion stretch on staircase; x 10, 10 sec hold   -pt reports better  tolerance with this versus heel slide in supine  *not today* SLR; 1 x 10   Therapeutic Activities - performance of CKC tasks and weight shifting drills to emulate demands of functional activities/ADLs  Bridge; 2 x 10  Ambulate laps in gym with no AD; x 3 laps with reciprocal gait pattern  -cueing for heel to toe progression  Stair negotiation; up/down; x 1 trial with BUE support and x 2 trials with unilateral UE support (R side going up, L going down)  PATIENT EDUCATION: Discussed need for continued work on ROM and strengthening prior to patient having other TKA completed 02/19/24.    *not today* Anterior toe tapping; 6-inch step; 20x alt Quad set, with towel roll; 2x10, 5 sec hold Supine hip abduction; 2 x 10, [employer] squeeze, hooklying; 2 x 10, 3 sec hold  Heel slide with sheet around foot; x10, 5 sec hold  Knee prop extension stretch with ice pack; x 3 minutes (L ankle propped on two pillows) Ambulate x 2 laps in gym with FWW with cueing for heel strike at initial contact and attaining full knee extension at terminal swing   PATIENT EDUCATION:  Education details: see above for patient education details Person educated: Patient Education method: Explanation, Demonstration, and Handouts Education comprehension: verbalized understanding and returned demonstration   HOME EXERCISE PROGRAM:  Pt continuing HEP given from hospital. Discussed continued work on propping L knee and maintaining in extended position for low-load, long duration stretch.   Access Code: [email or web address] URL: [email or web address] Date: 02/05/2024 Prepared by: Denese Finn  Exercises - Sit to Stand Without Arm Support  - 1 x daily - 7 x weekly - 2 sets - 10 reps - Alternating Step Taps with Counter Support  - 1 x daily - 7 x weekly - 2 sets - 10 reps   ASSESSMENT:  CLINICAL IMPRESSION: Pt reports more pain after mobilizing L knee further on Tuesday. She exhibits similar gait  quality and no major motion loss, but she does have remaining knee flexion stiffness requiring further intervention. Pt is able to safely negotiate steps with reciprocal pattern and 1-2 UE handrail support. Pt is currently planning for R TKA on 02/19/24; we will need to ensure functional ROM and recovery of LLE strength needed for post-op recovery for  opposite side. Pt has remaining impairments in: L knee/thigh pain, decreased L knee ROM, decreased strength, post-op edema, and L knee stiffness. Pt will continue to benefit from skilled PT services to address deficits and improve function.   OBJECTIVE IMPAIRMENTS: Abnormal gait, decreased mobility, difficulty walking, decreased ROM, decreased strength, hypomobility, impaired flexibility, and pain.   ACTIVITY LIMITATIONS: bending, sitting, standing, squatting, sleeping, stairs, transfers, bed mobility, bathing, dressing, and locomotion level  PARTICIPATION LIMITATIONS: meal prep, cleaning, laundry, driving, shopping, community activity, occupation, and yard work  PERSONAL FACTORS: 3+ comorbidities: (ankylosing spondylitis, dyslipidemia, depression) are also affecting patient's functional outcome.   REHAB POTENTIAL: Good  CLINICAL DECISION MAKING: Evolving/moderate complexity  EVALUATION COMPLEXITY: Moderate   GOALS: Goals reviewed with patient? Yes  SHORT TERM GOALS: Target date: 02/04/2024  Pt will be independent with HEP to improve strength and decrease knee pain to improve pain-free function at home and work. Baseline: 01/14/24: Baseline HEP initiated.  Goal status: INITIAL   LONG TERM GOALS: Target date: 03/13/2024  Pt will have L knee ROM 0-120 as needed for normalized motion required for transferring from surfaces of various height and to maintain symmetrical gait pattern Baseline: 01/15/24: L knee AROM in supine: -7-78. Goal status: INITIAL  2.  Pt will decrease worst knee pain by at least 3 points on the NPRS in order to demonstrate  clinically significant reduction in knee pain. Baseline: 01/15/24: 10/10 pain at worst.  Goal status: INITIAL  3.  Pt will decrease LEFS score by at least 9 points in order demonstrate clinically significant reduction in knee pain/disability.       Baseline: 01/15/24: 12/80 Goal status: INITIAL  4.  Pt will increase strength of tested LE musculature by at least 4+/5 or greater MMT grade in order to demonstrate improvement in strength and function  Baseline: 01/15/24: LLE MMT 2 to 4/5 (see chart above).  Goal status: INITIAL  5.  Pt will ambulate at community level (660 feet or greater in office) with no AD and symmetrical weightbearing on either LE without major deviations as needed for return to independent mobility and return to work Baseline: 01/15/24: LLE MMT 2 to 4/5 (see chart above).  Goal status: INITIAL   PLAN: PT FREQUENCY: 1-2x/week  PT DURATION: 8 weeks  PLANNED INTERVENTIONS: Therapeutic exercises, Therapeutic activity, Neuromuscular re-education, Balance training, Gait training, Patient/Family education, Self Care, Joint mobilization, Joint manipulation, Vestibular training, Canalith repositioning, Orthotic/Fit training, DME instructions, Dry Needling, Electrical stimulation, Spinal manipulation, Spinal mobilization, Cryotherapy, Moist heat, Taping, Traction, Ultrasound, Ionotophoresis 4mg /ml Dexamethasone , Manual therapy, and Re-evaluation.  PLAN FOR NEXT SESSION: Continue with restoration of knee ROM, quad strengthening and progression of SLR, progressively incorporating more CKC loading and functional activities   Denese Finn, PT, DPT #[id number]  Aleatha Hunting, PT 02/07/2024, 3:22 PM

## 2024-02-11 ENCOUNTER — Other Ambulatory Visit: Payer: Self-pay

## 2024-02-11 NOTE — Patient Instructions (Signed)
 SURGICAL WAITING ROOM VISITATION  Patients having surgery or a procedure may have no more than 2 support people in the waiting area - these visitors may rotate.    Children under the age of 95 must have an adult with them who is not the patient.  Due to an increase in RSV and influenza rates and associated hospitalizations, children ages 29 and under may not visit patients in [facility] hospitals.  Visitors with respiratory illnesses are discouraged from visiting and should remain at home.  If the patient needs to stay at the hospital during part of their recovery, the visitor guidelines for inpatient rooms apply. Pre-op nurse will coordinate an appropriate time for 1 support person to accompany patient in pre-op.  This support person may not rotate.    Please refer to the [facility] website for the visitor guidelines for Inpatients (after your surgery is over and you are in a regular room).       Your procedure is scheduled on: 02/19/24   Report to [facility] Main Entrance    Report to admitting at : 5:15 AM   Call this number if you have problems the morning of surgery [phone]   Do not eat food :After Midnight.   After Midnight you may have the following liquids until : 4:15 AM DAY OF SURGERY  Water  Non-Citrus Juices (without pulp, NO RED-Apple, [place]ki grape, [place] cranberry) Black Coffee (NO MILK/CREAM OR CREAMERS, sugar ok)  Clear Tea (NO MILK/CREAM OR CREAMERS, sugar ok) regular and decaf                             Plain Jell-O (NO RED)                                           Fruit ices (not with fruit pulp, NO RED)                                     Popsicles (NO RED)                                                               Sports drinks like Gatorade (NO RED)   The day of surgery:  Drink ONE (1) Pre-Surgery Clear Ensure at : 4:15 AM the morning of surgery. Drink in one sitting. Do not sip.  This drink was given to you during your hospital  pre-op  appointment visit. Nothing else to drink after completing the  Pre-Surgery Clear Ensure or G2.          If you have questions, please contact your surgeon's office.  FOLLOW ANY ADDITIONAL PRE OP INSTRUCTIONS YOU RECEIVED FROM YOUR SURGEON'S OFFICE!!!   Oral Hygiene is also important to reduce your risk of infection.                                    Remember - BRUSH YOUR TEETH THE MORNING OF SURGERY WITH YOUR REGULAR TOOTHPASTE  DENTURES WILL BE REMOVED PRIOR TO SURGERY PLEASE DO NOT APPLY "Poly grip" OR ADHESIVES!!!   Do NOT smoke after Midnight   Stop all vitamins and herbal supplements 7 days before surgery.   Take these medicines the morning of surgery with A SIP OF WATER : synthroid . Use inhalers as usual.                              You may not have any metal on your body including hair pins, jewelry, and body piercing             Do not wear make-up, lotions, powders, perfumes/cologne, or deodorant  Do not wear nail polish including gel and S&S, artificial/acrylic nails, or any other type of covering on natural nails including finger and toenails. If you have artificial nails, gel coating, etc. that needs to be removed by a nail salon please have this removed prior to surgery or surgery may need to be canceled/ delayed if the surgeon/ anesthesia feels like they are unable to be safely monitored.   Do not shave  48 hours prior to surgery.    Do not bring valuables to the hospital. [facility] IS NOT             RESPONSIBLE   FOR VALUABLES.   Contacts, glasses, dentures or bridgework may not be worn into surgery.   Bring small overnight bag day of surgery.   DO NOT BRING YOUR HOME MEDICATIONS TO THE HOSPITAL. PHARMACY WILL DISPENSE MEDICATIONS LISTED ON YOUR MEDICATION LIST TO YOU DURING YOUR ADMISSION IN THE HOSPITAL!    Patients discharged on the day of surgery will not be allowed to drive home.  Someone NEEDS to stay with you for the first 24 hours after  anesthesia.   Special Instructions: Bring a copy of your healthcare power of attorney and living will documents the day of surgery if you haven't scanned them before.              Please read over the following fact sheets you were given: IF YOU HAVE QUESTIONS ABOUT YOUR PRE-OP INSTRUCTIONS PLEASE CALL [phone]   If you received a COVID test during your pre-op visit  it is requested that you wear a mask when out in public, stay away from anyone that may not be feeling well and notify your surgeon if you develop symptoms. If you test positive for Covid or have been in contact with anyone that has tested positive in the last 10 days please notify you surgeon.      Pre-operative 5 CHG Bath Instructions   You can play a key role in reducing the risk of infection after surgery. Your skin needs to be as free of germs as possible. You can reduce the number of germs on your skin by washing with CHG (chlorhexidine  gluconate) soap before surgery. CHG is an antiseptic soap that kills germs and continues to kill germs even after washing.   DO NOT use if you have an allergy to chlorhexidine /CHG or antibacterial soaps. If your skin becomes reddened or irritated, stop using the CHG and notify one of our RNs at [phone].   Please shower with the CHG soap starting 4 days before surgery using the following schedule:     Please keep in mind the following:  DO NOT shave, including legs and underarms, starting the day of your first shower.   You may shave your face at any  point before/day of surgery.  Place clean sheets on your bed the day you start using CHG soap. Use a clean washcloth (not used since being washed) for each shower. DO NOT sleep with pets once you start using the CHG.   CHG Shower Instructions:  If you choose to wash your hair and private area, wash first with your normal shampoo/soap.  After you use shampoo/soap, rinse your hair and body thoroughly to remove shampoo/soap residue.   Turn the water  OFF and apply about 3 tablespoons (45 ml) of CHG soap to a CLEAN washcloth.  Apply CHG soap ONLY FROM YOUR NECK DOWN TO YOUR TOES (washing for 3-5 minutes)  DO NOT use CHG soap on face, private areas, open wounds, or sores.  Pay special attention to the area where your surgery is being performed.  If you are having back surgery, having someone wash your back for you may be helpful. Wait 2 minutes after CHG soap is applied, then you may rinse off the CHG soap.  Pat dry with a clean towel  Put on clean clothes/pajamas   If you choose to wear lotion, please use ONLY the CHG-compatible lotions on the back of this paper.     Additional instructions for the day of surgery: DO NOT APPLY any lotions, deodorants, cologne, or perfumes.   Put on clean/comfortable clothes.  Brush your teeth.  Ask your nurse before applying any prescription medications to the skin.   CHG Compatible Lotions   Aveeno Moisturizing lotion  Cetaphil Moisturizing Cream  Cetaphil Moisturizing Lotion  Clairol Herbal Essence Moisturizing Lotion, Dry Skin  Clairol Herbal Essence Moisturizing Lotion, Extra Dry Skin  Clairol Herbal Essence Moisturizing Lotion, Normal Skin  Curel Age Defying Therapeutic Moisturizing Lotion with Alpha Hydroxy  Curel Extreme Care Body Lotion  Curel Soothing Hands Moisturizing Hand Lotion  Curel Therapeutic Moisturizing Cream, Fragrance-Free  Curel Therapeutic Moisturizing Lotion, Fragrance-Free  Curel Therapeutic Moisturizing Lotion, Original Formula  Eucerin Daily Replenishing Lotion  Eucerin Dry Skin Therapy Plus Alpha Hydroxy Crme  Eucerin Dry Skin Therapy Plus Alpha Hydroxy Lotion  Eucerin Original Crme  Eucerin Original Lotion  Eucerin Plus Crme Eucerin Plus Lotion  Eucerin TriLipid Replenishing Lotion  Keri Anti-Bacterial Hand Lotion  Keri Deep Conditioning Original Lotion Dry Skin Formula Softly Scented  Keri Deep Conditioning Original Lotion, Fragrance Free  Sensitive Skin Formula  Keri Lotion Fast Absorbing Fragrance Free Sensitive Skin Formula  Keri Lotion Fast Absorbing Softly Scented Dry Skin Formula  Keri Original Lotion  Keri Skin Renewal Lotion Keri Silky Smooth Lotion  Keri Silky Smooth Sensitive Skin Lotion  Nivea Body Creamy Conditioning Oil  Nivea Body Extra Enriched Lotion  Nivea Body Original Lotion  Nivea Body Sheer Moisturizing Lotion Nivea Crme  Nivea Skin Firming Lotion  NutraDerm 30 Skin Lotion  NutraDerm Skin Lotion  NutraDerm Therapeutic Skin Cream  NutraDerm Therapeutic Skin Lotion  ProShield Protective Hand Cream  Provon moisturizing lotion   Incentive Spirometer  An incentive spirometer is a tool that can help keep your lungs clear and active. This tool measures how well you are filling your lungs with each breath. Taking long deep breaths may help reverse or decrease the chance of developing breathing (pulmonary) problems (especially infection) following: A long period of time when you are unable to move or be active. BEFORE THE PROCEDURE  If the spirometer includes an indicator to show your best effort, your nurse or respiratory therapist will set it to a desired goal. If possible, sit up straight  or lean slightly forward. Try not to slouch. Hold the incentive spirometer in an upright position. INSTRUCTIONS FOR USE  Sit on the edge of your bed if possible, or sit up as far as you can in bed or on a chair. Hold the incentive spirometer in an upright position. Breathe out normally. Place the mouthpiece in your mouth and seal your lips tightly around it. Breathe in slowly and as deeply as possible, raising the piston or the ball toward the top of the column. Hold your breath for 3-5 seconds or for as long as possible. Allow the piston or ball to fall to the bottom of the column. Remove the mouthpiece from your mouth and breathe out normally. Rest for a few seconds and repeat Steps 1 through 7 at least 10 times  every 1-2 hours when you are awake. Take your time and take a few normal breaths between deep breaths. The spirometer may include an indicator to show your best effort. Use the indicator as a goal to work toward during each repetition. After each set of 10 deep breaths, practice coughing to be sure your lungs are clear. If you have an incision (the cut made at the time of surgery), support your incision when coughing by placing a pillow or rolled up towels firmly against it. Once you are able to get out of bed, walk around indoors and cough well. You may stop using the incentive spirometer when instructed by your caregiver.  RISKS AND COMPLICATIONS Take your time so you do not get dizzy or light-headed. If you are in pain, you may need to take or ask for pain medication before doing incentive spirometry. It is harder to take a deep breath if you are having pain. AFTER USE Rest and breathe slowly and easily. It can be helpful to keep track of a log of your progress. Your caregiver can provide you with a simple table to help with this. If you are using the spirometer at home, follow these instructions: SEEK MEDICAL CARE IF:  You are having difficultly using the spirometer. You have trouble using the spirometer as often as instructed. Your pain medication is not giving enough relief while using the spirometer. You develop fever of 100.5 F (38.1 C) or higher. SEEK IMMEDIATE MEDICAL CARE IF:  You cough up bloody sputum that had not been present before. You develop fever of 102 F (38.9 C) or greater. You develop worsening pain at or near the incision site. MAKE SURE YOU:  Understand these instructions. Will watch your condition. Will get help right away if you are not doing well or get worse. Document Released: 01/08/2007 Document Revised: 11/20/2011 Document Reviewed: 03/11/2007 [facility] Patient Information 2014 [place],  [place].   ________________________________________________________________________

## 2024-02-12 ENCOUNTER — Ambulatory Visit: Attending: Student | Admitting: Physical Therapy

## 2024-02-12 ENCOUNTER — Encounter (HOSPITAL_COMMUNITY)
Admission: RE | Admit: 2024-02-12 | Discharge: 2024-02-12 | Disposition: A | Discharge status: Home / Self Care | Source: Ambulatory Visit | Attending: Orthopedic Surgery | Admitting: Orthopedic Surgery

## 2024-02-12 ENCOUNTER — Encounter (HOSPITAL_COMMUNITY): Payer: Self-pay

## 2024-02-12 ENCOUNTER — Other Ambulatory Visit: Payer: Self-pay

## 2024-02-12 VITALS — BP 126/91 | HR 75 | Temp 97.8°F | Ht 65.0 in | Wt 229.0 lb

## 2024-02-12 DIAGNOSIS — M25662 Stiffness of left knee, not elsewhere classified: Secondary | ICD-10-CM | POA: Diagnosis not present

## 2024-02-12 DIAGNOSIS — R262 Difficulty in walking, not elsewhere classified: Secondary | ICD-10-CM | POA: Insufficient documentation

## 2024-02-12 DIAGNOSIS — Z01818 Encounter for other preprocedural examination: Secondary | ICD-10-CM | POA: Diagnosis present

## 2024-02-12 DIAGNOSIS — R0789 Other chest pain: Secondary | ICD-10-CM | POA: Insufficient documentation

## 2024-02-12 DIAGNOSIS — M6281 Muscle weakness (generalized): Secondary | ICD-10-CM | POA: Diagnosis not present

## 2024-02-12 DIAGNOSIS — M25561 Pain in right knee: Secondary | ICD-10-CM | POA: Insufficient documentation

## 2024-02-12 DIAGNOSIS — G8918 Other acute postprocedural pain: Secondary | ICD-10-CM | POA: Diagnosis not present

## 2024-02-12 DIAGNOSIS — M25562 Pain in left knee: Secondary | ICD-10-CM | POA: Diagnosis not present

## 2024-02-12 DIAGNOSIS — M25661 Stiffness of right knee, not elsewhere classified: Secondary | ICD-10-CM | POA: Diagnosis not present

## 2024-02-12 HISTORY — DX: Prediabetes: R73.03

## 2024-02-12 HISTORY — DX: Chest pain, unspecified: R07.9

## 2024-02-12 LAB — CBC
HCT: 41 % (ref 36.0–46.0)
Hemoglobin: 13.1 g/dL (ref 12.0–15.0)
MCH: 28.7 pg (ref 26.0–34.0)
MCHC: 32 g/dL (ref 30.0–36.0)
MCV: 89.9 fL (ref 80.0–100.0)
Platelets: 432 10*3/uL — ABNORMAL HIGH (ref 150–400)
RBC: 4.56 MIL/uL (ref 3.87–5.11)
RDW: 14.2 % (ref 11.5–15.5)
WBC: 8.3 10*3/uL (ref 4.0–10.5)
nRBC: 0 % (ref 0.0–0.2)

## 2024-02-12 LAB — BASIC METABOLIC PANEL WITH GFR
Anion gap: 9 (ref 5–15)
BUN: 16 mg/dL (ref 6–20)
CO2: 24 mmol/L (ref 22–32)
Calcium: 9.2 mg/dL (ref 8.9–10.3)
Chloride: 105 mmol/L (ref 98–111)
Creatinine, Ser: 0.66 mg/dL (ref 0.44–1.00)
GFR, Estimated: >60 mL/min (ref >60)
Glucose, Bld: 109 mg/dL — ABNORMAL HIGH (ref 70–99)
Potassium: 4 mmol/L (ref 3.5–5.1)
Sodium: 138 mmol/L (ref 135–145)

## 2024-02-12 LAB — SURGICAL PCR SCREEN
MRSA, PCR: NEGATIVE
Staphylococcus aureus: NEGATIVE

## 2024-02-12 NOTE — Therapy (Unsigned)
 OUTPATIENT PHYSICAL THERAPY TREATMENT  Patient Name: Hailey [name] MRN: [id number] DOB:Jul 23, 1976, 48 y.o., female Today's Date: 02/12/2024  END OF SESSION:  PT End of Session - 02/12/24 1518     Visit Number 8    Number of Visits 17    Date for PT Re-Evaluation 03/11/24    PT Start Time 1514    PT Stop Time 1556    PT Time Calculation (min) 42 min    Activity Tolerance Patient tolerated treatment well    Behavior During Therapy [facility] for tasks assessed/performed              Past Medical History:  Diagnosis Date   Allergic state    Allergy    Ankylosing spondylitis (HCC)    followed by rheumatology Dr. Lydia Sams   Arthritis    Asthma    Cervical disc disease    Chest pain    Depression    Enlarged lymph nodes    GERD (gastroesophageal reflux disease)    H/O colonoscopy    HPV (human papilloma virus) infection    Hypothyroidism    Interstitial cystitis    LGSIL (low grade squamous intraepithelial dysplasia)    Paresthesia    Pre-diabetes    Status post laparoscopic cholecystectomy 07/13/2016   Past Surgical History:  Procedure Laterality Date   BREAST BIOPSY Right 10/18/2015   bx/clip- neg   CHOLECYSTECTOMY N/A 07/07/2016   Procedure: LAPAROSCOPIC CHOLECYSTECTOMY;  Surgeon: Emmalene Hare, MD;  Location: [facility] ORS;  Service: General;  Laterality: N/A;   COLONOSCOPY WITH PROPOFOL  N/A 10/11/2015   Procedure: COLONOSCOPY WITH PROPOFOL ;  Surgeon: Deveron Fly, MD;  Location: [facility] ENDOSCOPY;  Service: Endoscopy;  Laterality: N/A;   ESOPHAGOGASTRODUODENOSCOPY (EGD) WITH PROPOFOL  N/A 10/11/2015   Procedure: ESOPHAGOGASTRODUODENOSCOPY (EGD) WITH PROPOFOL ;  Surgeon: Deveron Fly, MD;  Location: [facility] ENDOSCOPY;  Service: Endoscopy;  Laterality: N/A;   TONSILLECTOMY     TOTAL KNEE ARTHROPLASTY Left 01/08/2024   Procedure: LEFT TOTAL KNEE ARTHROPLASTYL;  Surgeon: Claiborne Crew, MD;  Location: [facility] ORS;  Service: Orthopedics;  Laterality: Left;   TOTAL THYROIDECTOMY  2005    goiter   Patient Active Problem List   Diagnosis Date Noted   S/P total knee arthroplasty, left 01/08/2024   S/P total knee arthroplasty 01/08/2024   Atypical chest pain 11/23/2023   Preop cardiovascular exam 11/23/2023   Dyslipidemia 11/23/2023   Generalized lymphadenopathy 09/23/2021   B12 deficiency 09/23/2021   Fatigue 09/23/2021   Primary osteoarthritis of both knees 04/29/2021   Ankylosing spondylitis (HCC) 12/30/2018   Asthma in adult without complication 12/30/2018   Chronic diarrhea 12/30/2018   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   Interstitial cystitis 07/03/2016   Pelvic pain in female 07/03/2016   Chondromalacia of knee, 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   BMI 38.0-38.9,adult 02/14/2016   Polyarthralgia 02/14/2016   GERD (gastroesophageal reflux disease) 02/10/2014    PCP: Sheron Dixons, MD  REFERRING PROVIDER: Earnie Gola, PA-C  REFERRING DIAG: N82.95 (ICD-10-CM) - Unilateral primary osteoarthritis, left knee   RATIONALE FOR EVALUATION AND TREATMENT: Rehabilitation  THERAPY DIAG: Acute pain of left knee  Stiffness of left knee, not elsewhere classified  Difficulty in walking, not elsewhere classified  Muscle weakness (generalized)  ONSET DATE: 01/08/24 (DOS, L TKA)  FOLLOW-UP APPT SCHEDULED WITH REFERRING PROVIDER: Yes ; next f/u in 1 week for post-op appointment  PERTINENT HISTORY: Patient is a 48 y.o. female with Hx of ankylosing spondylitis s/p L TKA (01/08/24). Pt's husband states they had to cancel last  Thursday due to difficulty with getting out of the home. She and her husband report notable back pain as well with attempting to get comfortable in bed. Pt has significant ecchymosis along posterolateral knee and posterolateral thigh following surgery.   Pt reports she is sometimes waiting too late for pain medication. Pt reports one episode of chest pain.   PAIN:   Pain Intensity: Present: 7/10, Best: 4/10, Worst: 10/10 Pain location: Medial knee,  anterior thigh, posterior thigh, groin  Pain quality: sharp  Swelling: Yes  Numbness/Tingling: Yes; mild numbness adjacent to knee Focal weakness or buckling: Yes; hyperextended once with rapid walking.  Aggravating factors: keeping knee straight, lifting LLE, trying to lie down with LLE Relieving factors: ice, medication  Prior level of function: Independent Occupational demands: CT tech, X-ray tech; OR and dental imaging Hobbies: going to lake - boating  Red flags: Negative for personal history of cancer, chills/fever, night sweats, nausea, vomiting, unexplained weight gain/loss, unrelenting pain  PRECAUTIONS: None  WEIGHT BEARING RESTRICTIONS: WBAT  FALLS: Has patient fallen in last 6 months? No  Living Environment Lives with: lives with their spouse; 27 y/o daughter Lives in: House/apartment No steps to get into home; one-level home, handicap accessible, shower seat  Patient Goals: Get back to work; able to walk at community-level with no AD; improved QoL    OBJECTIVE:     Gross Musculoskeletal Assessment Tremor: None Bulk: R quad atrophy Tone: Normal  GAIT: Distance walked: 50 ft Assistive device utilized: Environmental consultant - 2 wheeled Level of assistance: SBA Comments: Heavy lean onto walker, good terminal knee extension, decreased stance time on surgical LE, shortened step length bilaterally, sound heel strike   AROM AROM (Normal range in degrees) AROM 01/15/24  Hip Right Left  Flexion (125)    Extension (15)    Abduction (40)    Adduction     Internal Rotation (45)    External Rotation (45)        Knee    Flexion (135) 120 78  Extension (0) WNL -7      Ankle    Dorsiflexion (20) WNL WNL  Plantarflexion (50)    Inversion (35)    Eversion (15    (* = pain; Blank rows = not tested)  LE MMT: MMT (out of 5) Right 01/15/24 Left 01/15/24  Hip flexion 4- 4-  Hip extension    Hip abduction (seated) 4 4  Hip adduction    Hip internal rotation    Hip external  rotation    Knee flexion 4+ 2  Knee extension 4- 3+  Ankle dorsiflexion    Ankle plantarflexion    Ankle inversion    Ankle eversion    (* = pain; Blank rows = not tested)  Palpation Location LEFT  RIGHT           Quadriceps 1   Medial Hamstrings 1   Lateral Hamstrings 1   Lateral Hamstring tendon 1   Medial Hamstring tendon 1   Quadriceps tendon 1   Patella 1   Patellar Tendon 1   Tibial Tuberosity 1   Medial joint line 2   Lateral joint line 1   MCL    LCL    Adductor Tubercle    Pes Anserine tendon    Infrapatellar fat pad    Fibular head    Popliteal fossa    (Blank rows = not tested) Graded on 0-4 scale (0 = no pain, 1 = pain, 2 = pain with wincing/grimacing/flinching,  3 = pain with withdrawal, 4 = unwilling to allow palpation), (Blank rows = not tested)   VASCULAR Dorsalis pedis and posterior tibial pulses are palpable Homan's sign (-) No sign of acute DVT     TODAY'S TREATMENT:   SUBJECTIVE STATEMENT:   Patient reports 4/10 pain at arrival. Pt reports some malaise due to lack of sleep recently. Patient reports difficulty with sleeping over the weekend and minimal sleep the previous evening. Patient reports spasm affecting hamstrings region.     Manual Therapy - for symptom modulation, soft tissue sensitivity and mobility, joint mobility, ROM   L knee PROM flexion and extension as tolerated in supine x 8 minutes TF posterior mobilization gr II for pain control; 2 x 30 sec bouts Patellar mobilization; x 30 sec bouts, medial/lateral and superior/inferior  Knee ROM -2-109   Therapeutic Exercise - for improved soft tissue flexibility and extensibility as needed for ROM, improved strength as needed to improve performance of CKC activities/functional movements  NuStep, Level 4; 5 minutes for knee AAROM  Knee extension propped stretch on 2 pillows, pt lying supine; cold pack around knee; x 2 minutes  Heel slide; x10, 10 sec   *not today* SLR; 1 x  10 Knee flexion stretch on staircase; x 10, 10 sec hold   -pt reports better tolerance with this versus heel slide in supine   Therapeutic Activities - performance of CKC tasks and weight shifting drills to emulate demands of functional activities/ADLs  Dynamic march in // bars; 5x D/B  Ambulate laps in gym with no AD; x 3 laps with reciprocal gait pattern  -cueing for heel to toe progression  Stair negotiation;  x 2 trials with unilateral UE support and reciprocal pattern (R side going up, L going down)  PATIENT EDUCATION: Discussed need for continued work on ROM and strengthening prior to patient having other TKA completed 02/19/24.    *not today* Anterior toe tapping; 6-inch step; 20x alt Quad set, with towel roll; 2x10, 5 sec hold Supine hip abduction; 2 x 10, [employer] squeeze, hooklying; 2 x 10, 3 sec hold  Heel slide with sheet around foot; x10, 5 sec hold  Knee prop extension stretch with ice pack; x 3 minutes (L ankle propped on two pillows) Ambulate x 2 laps in gym with FWW with cueing for heel strike at initial contact and attaining full knee extension at terminal swing   PATIENT EDUCATION:  Education details: see above for patient education details Person educated: Patient Education method: Explanation, Demonstration, and Handouts Education comprehension: verbalized understanding and returned demonstration   HOME EXERCISE PROGRAM:  Pt continuing HEP given from hospital. Discussed continued work on propping L knee and maintaining in extended position for low-load, long duration stretch.   Access Code: [email or web address] URL: [email or web address] Date: 02/05/2024 Prepared by: Denese Finn  Exercises - Sit to Stand Without Arm Support  - 1 x daily - 7 x weekly - 2 sets - 10 reps - Alternating Step Taps with Counter Support  - 1 x daily - 7 x weekly - 2 sets - 10 reps   ASSESSMENT:  CLINICAL IMPRESSION: Pt is still scheduled for R TKA 02/19/24.  She is nearing functional knee flexion for LLE and has WFL knee extension following knee extension stretch with foot propped. Patient does have notable pain with end-range ROM and some challenges with sleeping following events during which she was sitting for prolonged period. Pt will only have one more visit prior to moving forward  with other TKA. Pt has remaining impairments in: L knee/thigh pain, decreased L knee ROM, decreased strength, post-op edema, and L knee stiffness. Pt will continue to benefit from skilled PT services to address deficits and improve function.   OBJECTIVE IMPAIRMENTS: Abnormal gait, decreased mobility, difficulty walking, decreased ROM, decreased strength, hypomobility, impaired flexibility, and pain.   ACTIVITY LIMITATIONS: bending, sitting, standing, squatting, sleeping, stairs, transfers, bed mobility, bathing, dressing, and locomotion level  PARTICIPATION LIMITATIONS: meal prep, cleaning, laundry, driving, shopping, community activity, occupation, and yard work  PERSONAL FACTORS: 3+ comorbidities: (ankylosing spondylitis, dyslipidemia, depression) are also affecting patient's functional outcome.   REHAB POTENTIAL: Good  CLINICAL DECISION MAKING: Evolving/moderate complexity  EVALUATION COMPLEXITY: Moderate   GOALS: Goals reviewed with patient? Yes  SHORT TERM GOALS: Target date: 02/04/2024  Pt will be independent with HEP to improve strength and decrease knee pain to improve pain-free function at home and work. Baseline: 01/14/24: Baseline HEP initiated.  Goal status: INITIAL   LONG TERM GOALS: Target date: 03/13/2024  Pt will have L knee ROM 0-120 as needed for normalized motion required for transferring from surfaces of various height and to maintain symmetrical gait pattern Baseline: 01/15/24: L knee AROM in supine: -7-78. Goal status: INITIAL  2.  Pt will decrease worst knee pain by at least 3 points on the NPRS in order to demonstrate clinically  significant reduction in knee pain. Baseline: 01/15/24: 10/10 pain at worst.  Goal status: INITIAL  3.  Pt will decrease LEFS score by at least 9 points in order demonstrate clinically significant reduction in knee pain/disability.       Baseline: 01/15/24: 12/80 Goal status: INITIAL  4.  Pt will increase strength of tested LE musculature by at least 4+/5 or greater MMT grade in order to demonstrate improvement in strength and function  Baseline: 01/15/24: LLE MMT 2 to 4/5 (see chart above).  Goal status: INITIAL  5.  Pt will ambulate at community level (660 feet or greater in office) with no AD and symmetrical weightbearing on either LE without major deviations as needed for return to independent mobility and return to work Baseline: 01/15/24: LLE MMT 2 to 4/5 (see chart above).  Goal status: INITIAL   PLAN: PT FREQUENCY: 1-2x/week  PT DURATION: 8 weeks  PLANNED INTERVENTIONS: Therapeutic exercises, Therapeutic activity, Neuromuscular re-education, Balance training, Gait training, Patient/Family education, Self Care, Joint mobilization, Joint manipulation, Vestibular training, Canalith repositioning, Orthotic/Fit training, DME instructions, Dry Needling, Electrical stimulation, Spinal manipulation, Spinal mobilization, Cryotherapy, Moist heat, Taping, Traction, Ultrasound, Ionotophoresis 4mg /ml Dexamethasone , Manual therapy, and Re-evaluation.  PLAN FOR NEXT SESSION: Continue with restoration of knee ROM, quad strengthening and progression of SLR, progressively incorporating more CKC loading and functional activities   Denese Finn, PT, DPT #[id number]  Aleatha Hunting, PT 02/12/2024, 3:19 PM

## 2024-02-12 NOTE — Progress Notes (Addendum)
 For Anesthesia: PCP - Sheron Dixons, MD  Cardiologist - Dr. Ralene Burger . LOV: 11/23/23 Medical clearance by Dr. Gala Jubilee in media tab dated 12/19/23 Cardiac  clearance by Morey Ar, NP 12/14/23 in Epic  Bowel Prep reminder:  Chest x-ray - 11/27/23 CT coronary: 12/06/23 EKG - 11/27/23 Stress Test -  ECHO - 12/19/23 Cardiac Cath -  Pacemaker/ICD device last checked: Pacemaker orders received: Device Rep notified:  Spinal Cord Stimulator:N/A  Sleep Study - N/A CPAP -   Fasting Blood Sugar - N/A Checks Blood Sugar _____ times a day Date and result of last Hgb A1c-  Last dose of GLP1 agonist- N/A GLP1 instructions:   Last dose of SGLT-2 inhibitors- N/A SGLT-2 instructions:   Blood Thinner Instructions:N/A Aspirin  Instructions: On Hold Last Dose:  Activity level: Can go up a flight of stairs and activities of daily living without stopping and without chest pain and/or shortness of breath   Able to exercise without chest pain and/or shortness of breath  Anesthesia review: Hx: Atypical chest pain,Pre-DIA.  Patient denies shortness of breath, fever, cough and chest pain at PAT appointment   Patient verbalized understanding of instructions that were given to them at the PAT appointment. Patient was also instructed that they will need to review over the PAT instructions again at home before surgery.

## 2024-02-13 ENCOUNTER — Other Ambulatory Visit: Payer: Self-pay

## 2024-02-13 MED ORDER — TIZANIDINE HCL 4 MG PO TABS
4.0000 mg | ORAL_TABLET | Freq: Every evening | ORAL | 2 refills | Status: DC | PRN
  Filled 2024-02-13: qty 30, 30d supply, fill #0

## 2024-02-13 NOTE — Anesthesia Preprocedure Evaluation (Addendum)
 Anesthesia Evaluation  Patient identified by MRN, date of birth, ID band Patient awake    Reviewed: Allergy & Precautions, NPO status , Patient's Chart, lab work & pertinent test results  History of Anesthesia Complications Negative for: history of anesthetic complications  Airway Mallampati: II  TM Distance: >3 FB Neck ROM: Full    Dental  (+) Dental Advisory Given, Teeth Intact   Pulmonary asthma , former smoker   Pulmonary exam normal        Cardiovascular negative cardio ROS Normal cardiovascular exam     Neuro/Psych  PSYCHIATRIC DISORDERS  Depression    negative neurological ROS     GI/Hepatic Neg liver ROS,GERD  Controlled,,  Endo/Other  Hypothyroidism   Pre-DM Obesity   Renal/GU negative Renal ROS     Musculoskeletal  (+) Arthritis ,   Ankylosing spondylitis    Abdominal  (+) + obese  Peds  Hematology negative hematology ROS (+)   Anesthesia Other Findings   Reproductive/Obstetrics                              Anesthesia Physical Anesthesia Plan  ASA: 2  Anesthesia Plan: Spinal   Post-op Pain Management: Tylenol  PO (pre-op)* and Regional block*   Induction:   PONV Risk Score and Plan: 2 and Treatment may vary due to age or medical condition and Propofol  infusion  Airway Management Planned: Natural Airway and Simple Face Mask  Additional Equipment: None  Intra-op Plan:   Post-operative Plan:   Informed Consent: I have reviewed the patients History and Physical, chart, labs and discussed the procedure including the risks, benefits and alternatives for the proposed anesthesia with the patient or authorized representative who has indicated his/her understanding and acceptance.       Plan Discussed with: CRNA and Anesthesiologist  Anesthesia Plan Comments: (Labs reviewed, platelets acceptable. Discussed risks and benefits of spinal, including spinal/epidural  hematoma, infection, failed block, and PDPH. Patient expressed understanding and wished to proceed.  See PAT note 4/21)         Anesthesia Quick Evaluation

## 2024-02-14 ENCOUNTER — Encounter: Payer: Self-pay | Admitting: Physical Therapy

## 2024-02-14 ENCOUNTER — Ambulatory Visit

## 2024-02-14 DIAGNOSIS — M25662 Stiffness of left knee, not elsewhere classified: Secondary | ICD-10-CM

## 2024-02-14 DIAGNOSIS — M25562 Pain in left knee: Secondary | ICD-10-CM | POA: Diagnosis not present

## 2024-02-14 DIAGNOSIS — R262 Difficulty in walking, not elsewhere classified: Secondary | ICD-10-CM

## 2024-02-14 DIAGNOSIS — M6281 Muscle weakness (generalized): Secondary | ICD-10-CM

## 2024-02-14 DIAGNOSIS — G8918 Other acute postprocedural pain: Secondary | ICD-10-CM | POA: Diagnosis not present

## 2024-02-14 DIAGNOSIS — M25561 Pain in right knee: Secondary | ICD-10-CM | POA: Diagnosis not present

## 2024-02-14 DIAGNOSIS — M25661 Stiffness of right knee, not elsewhere classified: Secondary | ICD-10-CM | POA: Diagnosis not present

## 2024-02-14 NOTE — Therapy (Signed)
 OUTPATIENT PHYSICAL THERAPY TREATMENT  Patient Name: Hailey [name] MRN: [id number] DOB:September 07, 1976, 48 y.o., female Today's Date: 02/14/2024  END OF SESSION:  PT End of Session - 02/14/24 1514     Visit Number 9    Number of Visits 17    Date for PT Re-Evaluation 03/11/24    PT Start Time 1515    PT Stop Time 1556    PT Time Calculation (min) 41 min    Activity Tolerance Patient tolerated treatment well    Behavior During Therapy [facility] for tasks assessed/performed               Past Medical History:  Diagnosis Date   Allergic state    Allergy    Ankylosing spondylitis (HCC)    followed by rheumatology Dr. Lydia Sams   Arthritis    Asthma    Cervical disc disease    Chest pain    Depression    Enlarged lymph nodes    GERD (gastroesophageal reflux disease)    H/O colonoscopy    HPV (human papilloma virus) infection    Hypothyroidism    Interstitial cystitis    LGSIL (low grade squamous intraepithelial dysplasia)    Paresthesia    Pre-diabetes    Status post laparoscopic cholecystectomy 07/13/2016   Past Surgical History:  Procedure Laterality Date   BREAST BIOPSY Right 10/18/2015   bx/clip- neg   CHOLECYSTECTOMY N/A 07/07/2016   Procedure: LAPAROSCOPIC CHOLECYSTECTOMY;  Surgeon: Emmalene Hare, MD;  Location: [facility] ORS;  Service: General;  Laterality: N/A;   COLONOSCOPY WITH PROPOFOL  N/A 10/11/2015   Procedure: COLONOSCOPY WITH PROPOFOL ;  Surgeon: Deveron Fly, MD;  Location: [facility] ENDOSCOPY;  Service: Endoscopy;  Laterality: N/A;   ESOPHAGOGASTRODUODENOSCOPY (EGD) WITH PROPOFOL  N/A 10/11/2015   Procedure: ESOPHAGOGASTRODUODENOSCOPY (EGD) WITH PROPOFOL ;  Surgeon: Deveron Fly, MD;  Location: [facility] ENDOSCOPY;  Service: Endoscopy;  Laterality: N/A;   TONSILLECTOMY     TOTAL KNEE ARTHROPLASTY Left 01/08/2024   Procedure: LEFT TOTAL KNEE ARTHROPLASTYL;  Surgeon: Claiborne Crew, MD;  Location: [facility] ORS;  Service: Orthopedics;  Laterality: Left;   TOTAL THYROIDECTOMY  2005    goiter   Patient Active Problem List   Diagnosis Date Noted   S/P total knee arthroplasty, left 01/08/2024   S/P total knee arthroplasty 01/08/2024   Atypical chest pain 11/23/2023   Preop cardiovascular exam 11/23/2023   Dyslipidemia 11/23/2023   Generalized lymphadenopathy 09/23/2021   B12 deficiency 09/23/2021   Fatigue 09/23/2021   Primary osteoarthritis of both knees 04/29/2021   Ankylosing spondylitis (HCC) 12/30/2018   Asthma in adult without complication 12/30/2018   Chronic diarrhea 12/30/2018   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   Interstitial cystitis 07/03/2016   Pelvic pain in female 07/03/2016   Chondromalacia of knee, 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   BMI 38.0-38.9,adult 02/14/2016   Polyarthralgia 02/14/2016   GERD (gastroesophageal reflux disease) 02/10/2014    PCP: Sheron Dixons, MD  REFERRING PROVIDER: Earnie Gola, PA-C  REFERRING DIAG: N02.72 (ICD-10-CM) - Unilateral primary osteoarthritis, left knee   RATIONALE FOR EVALUATION AND TREATMENT: Rehabilitation  THERAPY DIAG: Acute pain of left knee  Stiffness of left knee, not elsewhere classified  Difficulty in walking, not elsewhere classified  Muscle weakness (generalized)  ONSET DATE: 01/08/24 (DOS, L TKA)  FOLLOW-UP APPT SCHEDULED WITH REFERRING PROVIDER: Yes ; next f/u in 1 week for post-op appointment  PERTINENT HISTORY: Patient is a 48 y.o. female with Hx of ankylosing spondylitis s/p L TKA (01/08/24). Pt's husband states they had to cancel  last Thursday due to difficulty with getting out of the home. She and her husband report notable back pain as well with attempting to get comfortable in bed. Pt has significant ecchymosis along posterolateral knee and posterolateral thigh following surgery.   Pt reports she is sometimes waiting too late for pain medication. Pt reports one episode of chest pain.   PAIN:   Pain Intensity: Present: 7/10, Best: 4/10, Worst: 10/10 Pain location: Medial knee,  anterior thigh, posterior thigh, groin  Pain quality: sharp  Swelling: Yes  Numbness/Tingling: Yes; mild numbness adjacent to knee Focal weakness or buckling: Yes; hyperextended once with rapid walking.  Aggravating factors: keeping knee straight, lifting LLE, trying to lie down with LLE Relieving factors: ice, medication  Prior level of function: Independent Occupational demands: CT tech, X-ray tech; OR and dental imaging Hobbies: going to lake - boating  Red flags: Negative for personal history of cancer, chills/fever, night sweats, nausea, vomiting, unexplained weight gain/loss, unrelenting pain  PRECAUTIONS: None  WEIGHT BEARING RESTRICTIONS: WBAT  FALLS: Has patient fallen in last 6 months? No  Living Environment Lives with: lives with their spouse; 70 y/o daughter Lives in: House/apartment No steps to get into home; one-level home, handicap accessible, shower seat  Patient Goals: Get back to work; able to walk at community-level with no AD; improved QoL    OBJECTIVE:     Gross Musculoskeletal Assessment Tremor: None Bulk: R quad atrophy Tone: Normal  GAIT: Distance walked: 50 ft Assistive device utilized: Environmental consultant - 2 wheeled Level of assistance: SBA Comments: Heavy lean onto Jewelia Bocchino, good terminal knee extension, decreased stance time on surgical LE, shortened step length bilaterally, sound heel strike   AROM AROM (Normal range in degrees) AROM 01/15/24  Hip Right Left  Flexion (125)    Extension (15)    Abduction (40)    Adduction     Internal Rotation (45)    External Rotation (45)        Knee    Flexion (135) 120 78  Extension (0) WNL -7      Ankle    Dorsiflexion (20) WNL WNL  Plantarflexion (50)    Inversion (35)    Eversion (15    (* = pain; Blank rows = not tested)  LE MMT: MMT (out of 5) Right 01/15/24 Left 01/15/24  Hip flexion 4- 4-  Hip extension    Hip abduction (seated) 4 4  Hip adduction    Hip internal rotation    Hip external  rotation    Knee flexion 4+ 2  Knee extension 4- 3+  Ankle dorsiflexion    Ankle plantarflexion    Ankle inversion    Ankle eversion    (* = pain; Blank rows = not tested)  Palpation Location LEFT  RIGHT           Quadriceps 1   Medial Hamstrings 1   Lateral Hamstrings 1   Lateral Hamstring tendon 1   Medial Hamstring tendon 1   Quadriceps tendon 1   Patella 1   Patellar Tendon 1   Tibial Tuberosity 1   Medial joint line 2   Lateral joint line 1   MCL    LCL    Adductor Tubercle    Pes Anserine tendon    Infrapatellar fat pad    Fibular head    Popliteal fossa    (Blank rows = not tested) Graded on 0-4 scale (0 = no pain, 1 = pain, 2 = pain with  wincing/grimacing/flinching, 3 = pain with withdrawal, 4 = unwilling to allow palpation), (Blank rows = not tested)   VASCULAR Dorsalis pedis and posterior tibial pulses are palpable Homan's sign (-) No sign of acute DVT     TODAY'S TREATMENT: 02/14/24   SUBJECTIVE STATEMENT:   Patient arrives with no AD and reports some stiffness in L knee (2/10). She is getting ready for her    Therapeutic Activities - performance of CKC tasks and weight shifting drills to emulate demands of functional activities/ADLs  Knee flexion stretch on 2nd step on stair 3 x 30 seconds   6" step ups leading with each LE with light B UE support x 10  TRX squats with chair behind patient 2 x 10   Seated LAQ 2# AW 2 x 10   Standing TKE with GTB 2 x 10 on L   Dynamic march in // bars; 5x D/B   PATIENT EDUCATION: Discussed need for continued work on ROM and strengthening prior to patient having other TKA completed 02/19/24.    *not today* Anterior toe tapping; 6-inch step; 20x alt Quad set, with towel roll; 2x10, 5 sec hold Supine hip abduction; 2 x 10, [employer] squeeze, hooklying; 2 x 10, 3 sec hold  Heel slide with sheet around foot; x10, 5 sec hold  Knee prop extension stretch with ice pack; x 3 minutes (L ankle propped on  two pillows) Ambulate x 2 laps in gym with FWW with cueing for heel strike at initial contact and attaining full knee extension at terminal swing   PATIENT EDUCATION:  Education details: see above for patient education details Person educated: Patient Education method: Explanation, Demonstration, and Handouts Education comprehension: verbalized understanding and returned demonstration   HOME EXERCISE PROGRAM:  Pt continuing HEP given from hospital. Discussed continued work on propping L knee and maintaining in extended position for low-load, long duration stretch.   Access Code: [email or web address] URL: [email or web address] Date: 02/05/2024 Prepared by: Denese Finn  Exercises - Sit to Stand Without Arm Support  - 1 x daily - 7 x weekly - 2 sets - 10 reps - Alternating Step Taps with Counter Support  - 1 x daily - 7 x weekly - 2 sets - 10 reps   ASSESSMENT:  CLINICAL IMPRESSION:   Pt is still scheduled for R TKA 02/19/24. She is nearing functional knee flexion for LLE . Session focused on L quad strengthening in sitting and standing. Patient will have one more visit prior to R TKA. Pt has remaining impairments in: L knee/thigh pain, decreased L knee ROM, decreased strength, post-op edema, and L knee stiffness. Pt will continue to benefit from skilled PT services to address deficits and improve function.   OBJECTIVE IMPAIRMENTS: Abnormal gait, decreased mobility, difficulty walking, decreased ROM, decreased strength, hypomobility, impaired flexibility, and pain.   ACTIVITY LIMITATIONS: bending, sitting, standing, squatting, sleeping, stairs, transfers, bed mobility, bathing, dressing, and locomotion level  PARTICIPATION LIMITATIONS: meal prep, cleaning, laundry, driving, shopping, community activity, occupation, and yard work  PERSONAL FACTORS: 3+ comorbidities: (ankylosing spondylitis, dyslipidemia, depression) are also affecting patient's functional outcome.   REHAB  POTENTIAL: Good  CLINICAL DECISION MAKING: Evolving/moderate complexity  EVALUATION COMPLEXITY: Moderate   GOALS: Goals reviewed with patient? Yes  SHORT TERM GOALS: Target date: 02/04/2024  Pt will be independent with HEP to improve strength and decrease knee pain to improve pain-free function at home and work. Baseline: 01/14/24: Baseline HEP initiated.  Goal status: INITIAL   LONG TERM GOALS: Target  date: 03/13/2024  Pt will have L knee ROM 0-120 as needed for normalized motion required for transferring from surfaces of various height and to maintain symmetrical gait pattern Baseline: 01/15/24: L knee AROM in supine: -7-78. Goal status: INITIAL  2.  Pt will decrease worst knee pain by at least 3 points on the NPRS in order to demonstrate clinically significant reduction in knee pain. Baseline: 01/15/24: 10/10 pain at worst.  Goal status: INITIAL  3.  Pt will decrease LEFS score by at least 9 points in order demonstrate clinically significant reduction in knee pain/disability.       Baseline: 01/15/24: 12/80 Goal status: INITIAL  4.  Pt will increase strength of tested LE musculature by at least 4+/5 or greater MMT grade in order to demonstrate improvement in strength and function  Baseline: 01/15/24: LLE MMT 2 to 4/5 (see chart above).  Goal status: INITIAL  5.  Pt will ambulate at community level (660 feet or greater in office) with no AD and symmetrical weightbearing on either LE without major deviations as needed for return to independent mobility and return to work Baseline: 01/15/24: LLE MMT 2 to 4/5 (see chart above).  Goal status: INITIAL   PLAN: PT FREQUENCY: 1-2x/week  PT DURATION: 8 weeks  PLANNED INTERVENTIONS: Therapeutic exercises, Therapeutic activity, Neuromuscular re-education, Balance training, Gait training, Patient/Family education, Self Care, Joint mobilization, Joint manipulation, Vestibular training, Canalith repositioning, Orthotic/Fit training, DME  instructions, Dry Needling, Electrical stimulation, Spinal manipulation, Spinal mobilization, Cryotherapy, Moist heat, Taping, Traction, Ultrasound, Ionotophoresis 4mg /ml Dexamethasone , Manual therapy, and Re-evaluation.  PLAN FOR NEXT SESSION: Continue with restoration of knee ROM, quad strengthening and progression of SLR, progressively incorporating more CKC loading and functional activities  Janine Melbourne, PT, DPT Physical Therapist - [place]  [facility]  02/14/2024, 3:14 PM

## 2024-02-15 ENCOUNTER — Other Ambulatory Visit: Payer: Self-pay

## 2024-02-18 ENCOUNTER — Ambulatory Visit: Admitting: Physical Therapy

## 2024-02-18 ENCOUNTER — Other Ambulatory Visit: Payer: Self-pay

## 2024-02-18 DIAGNOSIS — M25561 Pain in right knee: Secondary | ICD-10-CM | POA: Diagnosis not present

## 2024-02-18 DIAGNOSIS — M6281 Muscle weakness (generalized): Secondary | ICD-10-CM

## 2024-02-18 DIAGNOSIS — G8918 Other acute postprocedural pain: Secondary | ICD-10-CM | POA: Diagnosis not present

## 2024-02-18 DIAGNOSIS — R262 Difficulty in walking, not elsewhere classified: Secondary | ICD-10-CM | POA: Diagnosis not present

## 2024-02-18 DIAGNOSIS — M25662 Stiffness of left knee, not elsewhere classified: Secondary | ICD-10-CM | POA: Diagnosis not present

## 2024-02-18 DIAGNOSIS — M25562 Pain in left knee: Secondary | ICD-10-CM | POA: Diagnosis not present

## 2024-02-18 DIAGNOSIS — M25661 Stiffness of right knee, not elsewhere classified: Secondary | ICD-10-CM | POA: Diagnosis not present

## 2024-02-18 MED ORDER — CELECOXIB 200 MG PO CAPS
200.0000 mg | ORAL_CAPSULE | Freq: Two times a day (BID) | ORAL | 0 refills | Status: DC
  Filled 2024-02-18: qty 60, 30d supply, fill #0

## 2024-02-18 MED ORDER — ASPIRIN 81 MG PO CHEW
81.0000 mg | CHEWABLE_TABLET | Freq: Two times a day (BID) | ORAL | 0 refills | Status: DC
  Filled 2024-02-18: qty 60, 30d supply, fill #0

## 2024-02-18 MED ORDER — SENNOSIDES 8.6 MG PO TABS
2.0000 | ORAL_TABLET | Freq: Every day | ORAL | 0 refills | Status: AC
  Filled 2024-02-18: qty 28, 14d supply, fill #0

## 2024-02-18 MED ORDER — TRANEXAMIC ACID 650 MG PO TABS
1950.0000 mg | ORAL_TABLET | Freq: Every day | ORAL | 0 refills | Status: AC
  Filled 2024-02-18: qty 9, 3d supply, fill #0

## 2024-02-18 MED ORDER — POLYETHYLENE GLYCOL 3350 17 GM/SCOOP PO POWD
17.0000 g | Freq: Two times a day (BID) | ORAL | 0 refills | Status: DC
  Filled 2024-02-18: qty 476, 14d supply, fill #0

## 2024-02-18 MED ORDER — ONDANSETRON 4 MG PO TBDP
4.0000 mg | ORAL_TABLET | Freq: Four times a day (QID) | ORAL | 0 refills | Status: DC | PRN
  Filled 2024-02-18: qty 10, 3d supply, fill #0

## 2024-02-18 MED ORDER — OXYCODONE HCL 5 MG PO TABS
5.0000 mg | ORAL_TABLET | ORAL | 0 refills | Status: DC | PRN
  Filled 2024-02-18: qty 42, 7d supply, fill #0

## 2024-02-18 NOTE — H&P (Signed)
 TOTAL KNEE ADMISSION H&P  Patient is being admitted for right total knee arthroplasty.   Therapy Plans: outpatient therapy at [facility] Disposition: Home with husband Planned DVT Prophylaxis: aspirin  81mg  BID DME needed: walker & ice machine PCP: Janna Melter - clearance received Cardio: Dr. Gordan Latina - clearance received TXA: IV Allergies: cipro - hives, etodolac - SHOB (mild) tolerates all other NSAIDs, latex - rash, levaquin- rash, seroquel - sleepy, sulfa - rash, tizanidine  - rash , vidocin - urinary retention Anesthesia Concerns: none BMI: 38.7 Last HgbA1c: Not diabetic     Other: - recent left TKA - Planning on SDD - ankylosing spondylitis, hashimotos - no hx of VTE or cancer - oxycodone , tizanidine , tylenol , celebrex  - she works in radiology  Subjective:  Chief Complaint:right knee pain.  HPI: Hailey [name], 48 y.o. female, has a history of pain and functional disability in the right knee due to arthritis and has failed non-surgical conservative treatments for greater than 12 weeks to includeNSAID's and/or analgesics, corticosteriod injections, and activity modification.  Onset of symptoms was gradual, starting 2 years ago with gradually worsening course since that time. The patient noted no past surgery on the right knee(s).  Patient currently rates pain in the right knee(s) at 8 out of 10 with activity. Patient has worsening of pain with activity and weight bearing and pain that interferes with activities of daily living.  Patient has evidence of joint space narrowing by imaging studies. There is no active infection.  Patient Active Problem List   Diagnosis Date Noted   S/P total knee arthroplasty, left 01/08/2024   S/P total knee arthroplasty 01/08/2024   Atypical chest pain 11/23/2023   Preop cardiovascular exam 11/23/2023   Dyslipidemia 11/23/2023   Generalized lymphadenopathy 09/23/2021   B12 deficiency 09/23/2021   Fatigue 09/23/2021   Primary  osteoarthritis of both knees 04/29/2021   Ankylosing spondylitis (HCC) 12/30/2018   Asthma in adult without complication 12/30/2018   Chronic diarrhea 12/30/2018   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   Interstitial cystitis 07/03/2016   Pelvic pain in female 07/03/2016   Chondromalacia of knee, 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   BMI 38.0-38.9,adult 02/14/2016   Polyarthralgia 02/14/2016   GERD (gastroesophageal reflux disease) 02/10/2014   Past Medical History:  Diagnosis Date   Allergic state    Allergy    Ankylosing spondylitis (HCC)    followed by rheumatology Dr. Lydia Sams   Arthritis    Asthma    Cervical disc disease    Chest pain    Depression    Enlarged lymph nodes    GERD (gastroesophageal reflux disease)    H/O colonoscopy    HPV (human papilloma virus) infection    Hypothyroidism    Interstitial cystitis    LGSIL (low grade squamous intraepithelial dysplasia)    Paresthesia    Pre-diabetes    Status post laparoscopic cholecystectomy 07/13/2016    Past Surgical History:  Procedure Laterality Date   BREAST BIOPSY Right 10/18/2015   bx/clip- neg   CHOLECYSTECTOMY N/A 07/07/2016   Procedure: LAPAROSCOPIC CHOLECYSTECTOMY;  Surgeon: Emmalene Hare, MD;  Location: [facility] ORS;  Service: General;  Laterality: N/A;   COLONOSCOPY WITH PROPOFOL  N/A 10/11/2015   Procedure: COLONOSCOPY WITH PROPOFOL ;  Surgeon: Deveron Fly, MD;  Location: [facility] ENDOSCOPY;  Service: Endoscopy;  Laterality: N/A;   ESOPHAGOGASTRODUODENOSCOPY (EGD) WITH PROPOFOL  N/A 10/11/2015   Procedure: ESOPHAGOGASTRODUODENOSCOPY (EGD) WITH PROPOFOL ;  Surgeon: Deveron Fly, MD;  Location: [facility] ENDOSCOPY;  Service: Endoscopy;  Laterality: N/A;   TONSILLECTOMY  TOTAL KNEE ARTHROPLASTY Left 01/08/2024   Procedure: LEFT TOTAL KNEE ARTHROPLASTYL;  Surgeon: Claiborne Crew, MD;  Location: [facility] ORS;  Service: Orthopedics;  Laterality: Left;   TOTAL THYROIDECTOMY  2005   goiter    No current facility-administered medications  for this encounter.   Current Outpatient Medications  Medication Sig Dispense Refill Last Dose/Taking   albuterol  (VENTOLIN  HFA) 108 (90 Base) MCG/ACT inhaler Inhale 2 puffs into the lungs every 6 (six) hours as needed for wheezing or shortness of breath. 6.7 g 0 Taking As Needed   celecoxib  (CELEBREX ) 200 MG capsule Take 1 capsule (200 mg total) by mouth 2 (two) times daily with a meal. 60 capsule 0 Taking   cyclobenzaprine  (FLEXERIL ) 5 MG tablet Take 1-2 tablets (5-10 mg total) by mouth every 8 (eight) hours as needed for muscle spasm. 40 tablet 0 Taking As Needed   fluticasone -salmeterol (ADVAIR  DISKUS) 100-50 MCG/ACT AEPB Inhale 1 puff into the lungs 2 (two) times daily as needed. (Patient taking differently: Inhale 1 puff into the lungs daily as needed (SOB).) 60 each 0 Taking Differently   Multiple Vitamin (MULTIVITAMIN WITH MINERALS) TABS tablet Take 1 tablet by mouth daily.   Taking   nystatin  cream (MYCOSTATIN ) Apply 1 Application topically 2 (two) times daily as needed (rash).   Taking As Needed   SYNTHROID  200 MCG tablet Take 1 tablet (200 mcg total) by mouth once daily. Take on an empty stomach with a glass of water  at least 30-60 minutes before breakfast. 90 tablet 3 Taking   aspirin  81 MG chewable tablet Chew 1 tablet (81 mg total) by mouth 2 (two) times daily. 60 tablet 0    celecoxib  (CELEBREX ) 200 MG capsule Take 1 capsule (200 mg total) by mouth 2 (two) times daily with meals. 60 capsule 0    hydrOXYzine  (ATARAX ) 25 MG tablet Take 1 tablet by mouth at bedtime as needed for sleep 30 tablet 0    methocarbamol  (ROBAXIN ) 500 MG tablet Take 1 tablet (500 mg total) by mouth every 6 (six) hours as needed. (Patient not taking: Reported on 02/05/2024) 40 tablet 2 Not Taking   ondansetron  (ZOFRAN -ODT) 4 MG disintegrating tablet Take 1 tablet by mouth every 6 hours as needed for nausea/vomiting 10 tablet 0    oxyCODONE  (OXY IR/ROXICODONE ) 5 MG immediate release tablet Take 1 tablet (5 mg total)  by mouth every 4 (four) hours as needed for severe pain. 42 tablet 0    polyethylene glycol powder (GLYCOLAX /MIRALAX ) 17 GM/SCOOP powder Mix 1 capful (17 grams) in liquid and drink by mouth 2 (two) times daily. 476 g 0    senna (SENOKOT) 8.6 MG tablet Take 2 tablets (17.2 mg total) by mouth at bedtime for 14 days. 28 tablet 0    tiZANidine  (ZANAFLEX ) 4 MG tablet Take 1 tablet (4 mg total) by mouth at bedtime as needed for muscle spasm or pain 30 tablet 2    tranexamic acid  (LYSTEDA ) 650 MG TABS tablet Take 3 tablets (1,950 mg total) by mouth daily for 3 days. 9 tablet 0    Allergies  Allergen Reactions   Levofloxacin Rash and Dermatitis   Lodine [Etodolac] Shortness Of Breath   Adalimumab      Unknown reaction    Ciprofloxacin Hives   Latex Rash    Social History   Tobacco Use   Smoking status: Former    Current packs/day: 0.00    Average packs/day: 0.5 packs/day for 7.0 years (3.5 ttl pk-yrs)    Types: Cigarettes  Start date: 65    Quit date: 2003    Years since quitting: 22.4    Passive exposure: Never   Smokeless tobacco: Never  Substance Use Topics   Alcohol use: Yes    Comment: Occ.    Family History  Problem Relation Age of Onset   Cancer Mother        Vulva cancer   Hypertension Mother    Stroke Mother    Goiter Mother    Diabetes Father    Hypertension Father    Hypertension Brother    Bladder Cancer Maternal Aunt    Hypertension Maternal Grandmother    Stroke Maternal Grandmother    Diabetes Maternal Grandmother    Breast cancer Cousin 46       pat cousin     Review of Systems  Constitutional:  Negative for chills and fever.  Respiratory:  Negative for cough and shortness of breath.   Cardiovascular:  Negative for chest pain.  Gastrointestinal:  Negative for nausea and vomiting.  Musculoskeletal:  Positive for arthralgias.     Objective:  Physical Exam Right Knee: No palpable effusion, warmth erythema genu varum Slight flexion contracture  with flexion of 110 degrees Tenderness mainly over the anterior medial knee No lower extremity edema, erythema or calf tenderness  Vital signs in last 24 hours:    Labs:   Estimated body mass index is 38.11 kg/m as calculated from the following:   Height as of 02/12/24: 5\' 5"  (1.651 m).   Weight as of 02/12/24: 103.9 kg.   Imaging Review Plain radiographs demonstrate severe degenerative joint disease of the right knee(s). The overall alignment isneutral. The bone quality appears to be adequate for age and reported activity level.      Assessment/Plan:  End stage arthritis, right knee   The patient history, physical examination, clinical judgment of the provider and imaging studies are consistent with end stage degenerative joint disease of the right knee(s) and total knee arthroplasty is deemed medically necessary. The treatment options including medical management, injection therapy arthroscopy and arthroplasty were discussed at length. The risks and benefits of total knee arthroplasty were presented and reviewed. The risks due to aseptic loosening, infection, stiffness, patella tracking problems, thromboembolic complications and other imponderables were discussed. The patient acknowledged the explanation, agreed to proceed with the plan and consent was signed. Patient is being admitted for inpatient treatment for surgery, pain control, PT, OT, prophylactic antibiotics, VTE prophylaxis, progressive ambulation and ADL's and discharge planning. The patient is planning to be discharged home.     Patient's anticipated LOS is less than 2 midnights, meeting these requirements: - Younger than 106 - Lives within 1 hour of care - Has a competent adult at home to recover with post-op recover - NO history of  - Chronic pain requiring opiods  - Diabetes  - Coronary Artery Disease  - Heart failure  - Heart attack  - Stroke  - DVT/VTE  - Cardiac arrhythmia  - Respiratory Failure/COPD  -  Renal failure  - Anemia  - Advanced Liver disease  Kim Pen, PA-C Orthopedic Surgery [facility] [place] [phone]

## 2024-02-18 NOTE — Therapy (Unsigned)
 OUTPATIENT PHYSICAL THERAPY TREATMENT  Patient Name: Hailey [name] MRN: [id number] DOB:[birth year], 48 y.o., female Today's Date: 02/18/2024  END OF SESSION:  PT End of Session - 02/18/24 1251     Visit Number 10    Number of Visits 17    Date for PT Re-Evaluation 03/11/24    PT Start Time 1245    PT Stop Time 1327    PT Time Calculation (min) 42 min    Activity Tolerance Patient tolerated treatment well    Behavior During Therapy [facility] for tasks assessed/performed              Past Medical History:  Diagnosis Date   Allergic state    Allergy    Ankylosing spondylitis (HCC)    followed by rheumatology Dr. Lydia Sams   Arthritis    Asthma    Cervical disc disease    Chest pain    Depression    Enlarged lymph nodes    GERD (gastroesophageal reflux disease)    H/O colonoscopy    HPV (human papilloma virus) infection    Hypothyroidism    Interstitial cystitis    LGSIL (low grade squamous intraepithelial dysplasia)    Paresthesia    Pre-diabetes    Status post laparoscopic cholecystectomy 07/13/2016   Past Surgical History:  Procedure Laterality Date   BREAST BIOPSY Right 10/18/2015   bx/clip- neg   CHOLECYSTECTOMY N/A 07/07/2016   Procedure: LAPAROSCOPIC CHOLECYSTECTOMY;  Surgeon: Emmalene Hare, MD;  Location: [facility] ORS;  Service: General;  Laterality: N/A;   COLONOSCOPY WITH PROPOFOL  N/A 10/11/2015   Procedure: COLONOSCOPY WITH PROPOFOL ;  Surgeon: Deveron Fly, MD;  Location: [facility] ENDOSCOPY;  Service: Endoscopy;  Laterality: N/A;   ESOPHAGOGASTRODUODENOSCOPY (EGD) WITH PROPOFOL  N/A 10/11/2015   Procedure: ESOPHAGOGASTRODUODENOSCOPY (EGD) WITH PROPOFOL ;  Surgeon: Deveron Fly, MD;  Location: [facility] ENDOSCOPY;  Service: Endoscopy;  Laterality: N/A;   TONSILLECTOMY     TOTAL KNEE ARTHROPLASTY Left 01/08/2024   Procedure: LEFT TOTAL KNEE ARTHROPLASTYL;  Surgeon: Claiborne Crew, MD;  Location: [facility] ORS;  Service: Orthopedics;  Laterality: Left;   TOTAL THYROIDECTOMY  2005    goiter   Patient Active Problem List   Diagnosis Date Noted   S/P total knee arthroplasty, left 01/08/2024   S/P total knee arthroplasty 01/08/2024   Atypical chest pain 11/23/2023   Preop cardiovascular exam 11/23/2023   Dyslipidemia 11/23/2023   Generalized lymphadenopathy 09/23/2021   B12 deficiency 09/23/2021   Fatigue 09/23/2021   Primary osteoarthritis of both knees 04/29/2021   Ankylosing spondylitis (HCC) 12/30/2018   Asthma in adult without complication 12/30/2018   Chronic diarrhea 12/30/2018   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   Interstitial cystitis 07/03/2016   Pelvic pain in female 07/03/2016   Chondromalacia of knee, 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   BMI 38.0-38.9,adult 02/14/2016   Polyarthralgia 02/14/2016   GERD (gastroesophageal reflux disease) 02/10/2014    PCP: Sheron Dixons, MD  REFERRING PROVIDER: Earnie Gola, PA-C  REFERRING DIAG: W09.81 (ICD-10-CM) - Unilateral primary osteoarthritis, left knee   RATIONALE FOR EVALUATION AND TREATMENT: Rehabilitation  THERAPY DIAG: Acute pain of left knee  Stiffness of left knee, not elsewhere classified  Difficulty in walking, not elsewhere classified  Muscle weakness (generalized)  ONSET DATE: 01/08/24 (DOS, L TKA)  FOLLOW-UP APPT SCHEDULED WITH REFERRING PROVIDER: Yes ; next f/u in 1 week for post-op appointment  PERTINENT HISTORY: Patient is a 48 y.o. female with Hx of ankylosing spondylitis s/p L TKA (01/08/24). Pt's husband states they had to cancel last  Thursday due to difficulty with getting out of the home. She and her husband report notable back pain as well with attempting to get comfortable in bed. Pt has significant ecchymosis along posterolateral knee and posterolateral thigh following surgery.   Pt reports she is sometimes waiting too late for pain medication. Pt reports one episode of chest pain.   PAIN:   Pain Intensity: Present: 7/10, Best: 4/10, Worst: 10/10 Pain location: Medial knee,  anterior thigh, posterior thigh, groin  Pain quality: sharp  Swelling: Yes  Numbness/Tingling: Yes; mild numbness adjacent to knee Focal weakness or buckling: Yes; hyperextended once with rapid walking.  Aggravating factors: keeping knee straight, lifting LLE, trying to lie down with LLE Relieving factors: ice, medication  Prior level of function: Independent Occupational demands: CT tech, X-ray tech; OR and dental imaging Hobbies: going to lake - boating  Red flags: Negative for personal history of cancer, chills/fever, night sweats, nausea, vomiting, unexplained weight gain/loss, unrelenting pain  PRECAUTIONS: None  WEIGHT BEARING RESTRICTIONS: WBAT  FALLS: Has patient fallen in last 6 months? No  Living Environment Lives with: lives with their spouse; 36 y/o daughter Lives in: House/apartment No steps to get into home; one-level home, handicap accessible, shower seat  Patient Goals: Get back to work; able to walk at community-level with no AD; improved QoL    OBJECTIVE:     Gross Musculoskeletal Assessment Tremor: None Bulk: R quad atrophy Tone: Normal  GAIT: Distance walked: 50 ft Assistive device utilized: Environmental consultant - 2 wheeled Level of assistance: SBA Comments: Heavy lean onto walker, good terminal knee extension, decreased stance time on surgical LE, shortened step length bilaterally, sound heel strike   AROM AROM (Normal range in degrees) AROM 01/15/24 AROM 02/18/24  Hip Right Left Rght Left  Flexion (125)      Extension (15)      Abduction (40)      Adduction       Internal Rotation (45)      External Rotation (45)            Knee      Flexion (135) 120 78 120 109 (passive 111)  Extension (0) WNL -7 WNL -2 (passive -2)        Ankle      Dorsiflexion (20) WNL WNL    Plantarflexion (50)      Inversion (35)      Eversion (15      (* = pain; Blank rows = not tested)  LE MMT: MMT (out of 5) Right 01/15/24 Left 01/15/24 Right 02/18/24 Left 02/18/24  Hip  flexion 4- 4- 4- -4  Hip extension      Hip abduction (seated) 4 4 4+ 4+  Hip adduction   4+ 4+  Hip internal rotation      Hip external rotation      Knee flexion 4+ 2 4+ 4-  Knee extension 4- 3+ 3+* 4*  Ankle dorsiflexion      Ankle plantarflexion      Ankle inversion      Ankle eversion      (* = pain; Blank rows = not tested)  Palpation Location LEFT  RIGHT           Quadriceps 1   Medial Hamstrings 1   Lateral Hamstrings 1   Lateral Hamstring tendon 1   Medial Hamstring tendon 1   Quadriceps tendon 1   Patella 1   Patellar Tendon 1   Tibial Tuberosity 1  Medial joint line 2   Lateral joint line 1   MCL    LCL    Adductor Tubercle    Pes Anserine tendon    Infrapatellar fat pad    Fibular head    Popliteal fossa    (Blank rows = not tested) Graded on 0-4 scale (0 = no pain, 1 = pain, 2 = pain with wincing/grimacing/flinching, 3 = pain with withdrawal, 4 = unwilling to allow palpation), (Blank rows = not tested)   VASCULAR Dorsalis pedis and posterior tibial pulses are palpable Homan's sign (-) No sign of acute DVT     TODAY'S TREATMENT: 02/18/24   SUBJECTIVE STATEMENT:   Patient reports notable hamstrings tightness/discomfort yesterday and she worked on rolling it with massage roller and stretching. She reports difficulty with sit to stand and gait at the time due to this. Patient reports fair progress to date with L knee; states it still "feels like sore thumb" and it feels stiff daily. She reports feeling of hyperextension when weightbearing on LLE. 3-4/10 pain at arrival.   *GOAL UPDATE PERFORMED   NuStep; Level 4, x 6 minutes - for improved soft tissue mobility and increased tissue temperature to improve muscle performance   -subjective gathered during this time   Therapeutic Activities - performance of CKC tasks and weight shifting drills to emulate demands of functional activities/ADLs  Knee flexion stretch on 2nd step on stair 3 x 30 seconds    6" step ups leading with each LE with light B UE support x 10  TRX squats with chair behind patient 2 x 10   Seated LAQ 2# AW 2 x 10   Standing TKE with GTB 2 x 10 on L   Dynamic march in // bars; 5x D/B   PATIENT EDUCATION: Discussed need for continued work on ROM and strengthening prior to patient having other TKA completed 02/19/24.    *not today* Anterior toe tapping; 6-inch step; 20x alt Quad set, with towel roll; 2x10, 5 sec hold Supine hip abduction; 2 x 10, [employer] squeeze, hooklying; 2 x 10, 3 sec hold  Heel slide with sheet around foot; x10, 5 sec hold  Knee prop extension stretch with ice pack; x 3 minutes (L ankle propped on two pillows) Ambulate x 2 laps in gym with FWW with cueing for heel strike at initial contact and attaining full knee extension at terminal swing   PATIENT EDUCATION:  Education details: see above for patient education details Person educated: Patient Education method: Explanation, Demonstration, and Handouts Education comprehension: verbalized understanding and returned demonstration   HOME EXERCISE PROGRAM:  Pt continuing HEP given from hospital. Discussed continued work on propping L knee and maintaining in extended position for low-load, long duration stretch.   Access Code: [email or web address] URL: [email or web address] Date: 02/05/2024 Prepared by: Denese Finn  Exercises - Sit to Stand Without Arm Support  - 1 x daily - 7 x weekly - 2 sets - 10 reps - Alternating Step Taps with Counter Support  - 1 x daily - 7 x weekly - 2 sets - 10 reps   ASSESSMENT:  CLINICAL IMPRESSION:   Pt is still scheduled for R TKA 02/19/24. She is nearing functional knee flexion for LLE . Session focused on L quad strengthening in sitting and standing. Patient will have one more visit prior to R TKA. Pt has remaining impairments in: L knee/thigh pain, decreased L knee ROM, decreased strength, post-op edema, and L knee stiffness. Pt will  continue to benefit from skilled PT services to address deficits and improve function.   OBJECTIVE IMPAIRMENTS: Abnormal gait, decreased mobility, difficulty walking, decreased ROM, decreased strength, hypomobility, impaired flexibility, and pain.   ACTIVITY LIMITATIONS: bending, sitting, standing, squatting, sleeping, stairs, transfers, bed mobility, bathing, dressing, and locomotion level  PARTICIPATION LIMITATIONS: meal prep, cleaning, laundry, driving, shopping, community activity, occupation, and yard work  PERSONAL FACTORS: 3+ comorbidities: (ankylosing spondylitis, dyslipidemia, depression) are also affecting patient's functional outcome.   REHAB POTENTIAL: Good  CLINICAL DECISION MAKING: Evolving/moderate complexity  EVALUATION COMPLEXITY: Moderate   GOALS: Goals reviewed with patient? Yes  SHORT TERM GOALS: Target date: 02/04/2024  Pt will be independent with HEP to improve strength and decrease knee pain to improve pain-free function at home and work. Baseline: 01/14/24: Baseline HEP initiated.    02/18/24: Pt is partially compliant with HEP.  Goal status: ON-GOING   LONG TERM GOALS: Target date: 03/13/2024  Pt will have L knee ROM 0-120 as needed for normalized motion required for transferring from surfaces of various height and to maintain symmetrical gait pattern Baseline: 01/15/24: L knee AROM in supine: -7-78.   02/18/24: AROM -2-109, PROM -2-111 Goal status: IN PROGRESS  2.  Pt will decrease worst knee pain by at least 3 points on the NPRS in order to demonstrate clinically significant reduction in knee pain. Baseline: 01/15/24: 10/10 pain at worst.    02/18/24: 5-6/10 at worst.  Goal status: ACHIEVED  3.  Pt will decrease LEFS score by at least 9 points in order demonstrate clinically significant reduction in knee pain/disability.       Baseline: 01/15/24: 12/80.  02/18/24: 40/80 Goal status: ACHIEVED  4.  Pt will increase strength of tested LE musculature by at least 4+/5 or  greater MMT grade in order to demonstrate improvement in strength and function  Baseline: 01/15/24: LLE MMT 2 to 4/5 (see chart above).   02/18/24: Variable MMTs with most muscle improved by one grade or greater (3+ to 4+) Goal status: IN PROGRESS  5.  Pt will ambulate at community level (660 feet or greater in office) with no AD and symmetrical weightbearing on either LE without major deviations as needed for return to independent mobility and return to work Baseline: 01/15/24: Limited gait, high level of pain and use of FWW.   02/18/24: Pt ambulates at community-level distance with SPC, 550 ft today with SPC.  Goal status: IN PROGRESS   PLAN: PT FREQUENCY: 1-2x/week  PT DURATION: 8 weeks  PLANNED INTERVENTIONS: Therapeutic exercises, Therapeutic activity, Neuromuscular re-education, Balance training, Gait training, Patient/Family education, Self Care, Joint mobilization, Joint manipulation, Vestibular training, Canalith repositioning, Orthotic/Fit training, DME instructions, Dry Needling, Electrical stimulation, Spinal manipulation, Spinal mobilization, Cryotherapy, Moist heat, Taping, Traction, Ultrasound, Ionotophoresis 4mg /ml Dexamethasone , Manual therapy, and Re-evaluation.  PLAN FOR NEXT SESSION: Continue with restoration of knee ROM, quad strengthening and progression of SLR, progressively incorporating more CKC loading and functional activities  Denese Finn, [place], [place] #[id number] Physical Therapist - [facility]  02/18/2024, 12:51 PM

## 2024-02-19 ENCOUNTER — Ambulatory Visit (HOSPITAL_BASED_OUTPATIENT_CLINIC_OR_DEPARTMENT_OTHER): Payer: Self-pay | Admitting: Anesthesiology

## 2024-02-19 ENCOUNTER — Encounter: Payer: Self-pay | Admitting: Physical Therapy

## 2024-02-19 ENCOUNTER — Encounter (HOSPITAL_COMMUNITY)
Admission: RE | Disposition: A | Discharge status: Home / Self Care | Payer: Self-pay | Source: Home / Self Care | Attending: Orthopedic Surgery

## 2024-02-19 ENCOUNTER — Other Ambulatory Visit: Payer: Self-pay

## 2024-02-19 ENCOUNTER — Observation Stay (HOSPITAL_COMMUNITY)
Admission: RE | Admit: 2024-02-19 | Discharge: 2024-02-20 | Disposition: A | Discharge status: Home / Self Care | Attending: Orthopedic Surgery | Admitting: Orthopedic Surgery

## 2024-02-19 ENCOUNTER — Ambulatory Visit (HOSPITAL_COMMUNITY): Payer: Self-pay | Admitting: Medical

## 2024-02-19 ENCOUNTER — Encounter (HOSPITAL_COMMUNITY): Payer: Self-pay | Admitting: Orthopedic Surgery

## 2024-02-19 ENCOUNTER — Encounter: Admitting: Physical Therapy

## 2024-02-19 DIAGNOSIS — Z79899 Other long term (current) drug therapy: Secondary | ICD-10-CM | POA: Diagnosis not present

## 2024-02-19 DIAGNOSIS — Z87891 Personal history of nicotine dependence: Secondary | ICD-10-CM | POA: Insufficient documentation

## 2024-02-19 DIAGNOSIS — Z96651 Presence of right artificial knee joint: Principal | ICD-10-CM

## 2024-02-19 DIAGNOSIS — J45909 Unspecified asthma, uncomplicated: Secondary | ICD-10-CM | POA: Insufficient documentation

## 2024-02-19 DIAGNOSIS — E039 Hypothyroidism, unspecified: Secondary | ICD-10-CM | POA: Insufficient documentation

## 2024-02-19 DIAGNOSIS — M1711 Unilateral primary osteoarthritis, right knee: Principal | ICD-10-CM | POA: Insufficient documentation

## 2024-02-19 DIAGNOSIS — Z96652 Presence of left artificial knee joint: Secondary | ICD-10-CM | POA: Insufficient documentation

## 2024-02-19 DIAGNOSIS — Z7982 Long term (current) use of aspirin: Secondary | ICD-10-CM | POA: Insufficient documentation

## 2024-02-19 DIAGNOSIS — Z01818 Encounter for other preprocedural examination: Secondary | ICD-10-CM

## 2024-02-19 DIAGNOSIS — G8918 Other acute postprocedural pain: Secondary | ICD-10-CM | POA: Diagnosis not present

## 2024-02-19 HISTORY — PX: TOTAL KNEE ARTHROPLASTY: SHX125

## 2024-02-19 LAB — URINALYSIS, ROUTINE W REFLEX MICROSCOPIC
Bilirubin Urine: NEGATIVE
Glucose, UA: NEGATIVE mg/dL
Ketones, ur: 5 mg/dL — AB
Leukocytes,Ua: NEGATIVE
Nitrite: NEGATIVE
Protein, ur: 100 mg/dL — AB
RBC / HPF: >50 RBC/hpf (ref 0–5)
Specific Gravity, Urine: 1.036 — ABNORMAL HIGH (ref 1.005–1.030)
pH: 5 (ref 5.0–8.0)

## 2024-02-19 LAB — POCT PREGNANCY, URINE: Preg Test, Ur: NEGATIVE

## 2024-02-19 SURGERY — ARTHROPLASTY, KNEE, TOTAL
Anesthesia: Monitor Anesthesia Care | Site: Knee | Laterality: Right

## 2024-02-19 MED ORDER — DEXAMETHASONE SODIUM PHOSPHATE 10 MG/ML IJ SOLN
INTRAMUSCULAR | Status: DC | PRN
  Administered 2024-02-19: 10 mg via INTRAVENOUS

## 2024-02-19 MED ORDER — ACETAMINOPHEN 500 MG PO TABS
1000.0000 mg | ORAL_TABLET | Freq: Once | ORAL | Status: AC
  Administered 2024-02-19: 1000 mg via ORAL
  Filled 2024-02-19: qty 2

## 2024-02-19 MED ORDER — ONDANSETRON HCL 4 MG/2ML IJ SOLN
4.0000 mg | Freq: Four times a day (QID) | INTRAMUSCULAR | Status: DC | PRN
  Administered 2024-02-20: 4 mg via INTRAVENOUS
  Filled 2024-02-19: qty 2

## 2024-02-19 MED ORDER — POVIDONE-IODINE 10 % EX SWAB
2.0000 | Freq: Once | CUTANEOUS | Status: AC
  Administered 2024-02-19: 2 via TOPICAL

## 2024-02-19 MED ORDER — BUPIVACAINE-EPINEPHRINE (PF) 0.25% -1:200000 IJ SOLN
INTRAMUSCULAR | Status: AC
  Filled 2024-02-19: qty 30

## 2024-02-19 MED ORDER — ORAL CARE MOUTH RINSE: 15.0000 mL | Freq: Once | OROMUCOSAL | Status: AC

## 2024-02-19 MED ORDER — PROPOFOL 10 MG/ML IV BOLUS
INTRAVENOUS | Status: AC
  Filled 2024-02-19: qty 20

## 2024-02-19 MED ORDER — MENTHOL 3 MG MT LOZG: 1.0000 | LOZENGE | OROMUCOSAL | Status: DC | PRN

## 2024-02-19 MED ORDER — KETOROLAC TROMETHAMINE 30 MG/ML IJ SOLN
INTRAMUSCULAR | Status: AC
  Filled 2024-02-19: qty 1

## 2024-02-19 MED ORDER — OXYCODONE HCL 5 MG PO TABS: 10.0000 mg | ORAL_TABLET | ORAL | Status: DC | PRN

## 2024-02-19 MED ORDER — LACTATED RINGERS IV SOLN: INTRAVENOUS | Status: DC

## 2024-02-19 MED ORDER — SODIUM CHLORIDE 0.9% FLUSH
3.0000 mL | Freq: Two times a day (BID) | INTRAVENOUS | Status: DC
  Administered 2024-02-19: 5 mL via INTRAVENOUS
  Administered 2024-02-19 – 2024-02-20 (×2): 10 mL via INTRAVENOUS

## 2024-02-19 MED ORDER — OXYCODONE HCL 5 MG/5ML PO SOLN: 5.0000 mg | Freq: Once | ORAL | Status: AC | PRN

## 2024-02-19 MED ORDER — ONDANSETRON HCL 4 MG/2ML IJ SOLN
INTRAMUSCULAR | Status: DC | PRN
  Administered 2024-02-19: 4 mg via INTRAVENOUS

## 2024-02-19 MED ORDER — ALUM & MAG HYDROXIDE-SIMETH 200-200-20 MG/5ML PO SUSP
30.0000 mL | ORAL | Status: DC | PRN
Start: 2024-02-19 — End: 2024-02-20

## 2024-02-19 MED ORDER — TIZANIDINE HCL 4 MG PO TABS
4.0000 mg | ORAL_TABLET | Freq: Three times a day (TID) | ORAL | Status: DC | PRN
  Administered 2024-02-19: 4 mg via ORAL
  Filled 2024-02-19: qty 1

## 2024-02-19 MED ORDER — POLYETHYLENE GLYCOL 3350 17 G PO PACK
17.0000 g | PACK | Freq: Two times a day (BID) | ORAL | Status: DC
  Administered 2024-02-19 – 2024-02-20 (×2): 17 g via ORAL
  Filled 2024-02-19 (×2): qty 1

## 2024-02-19 MED ORDER — PROPOFOL 500 MG/50ML IV EMUL
INTRAVENOUS | Status: DC | PRN
  Administered 2024-02-19: 100 ug/kg/min via INTRAVENOUS

## 2024-02-19 MED ORDER — FENTANYL CITRATE (PF) 100 MCG/2ML IJ SOLN
INTRAMUSCULAR | Status: AC
  Filled 2024-02-19: qty 2

## 2024-02-19 MED ORDER — DIPHENHYDRAMINE HCL 12.5 MG/5ML PO ELIX: 12.5000 mg | ORAL_SOLUTION | ORAL | Status: DC | PRN

## 2024-02-19 MED ORDER — SODIUM CHLORIDE 0.9 % IV SOLN: 12.5000 mg | INTRAVENOUS | Status: DC | PRN

## 2024-02-19 MED ORDER — HYDROMORPHONE HCL 1 MG/ML IJ SOLN
INTRAMUSCULAR | Status: DC | PRN
  Administered 2024-02-19 (×4): .5 mg via INTRAVENOUS

## 2024-02-19 MED ORDER — STERILE WATER FOR IRRIGATION IR SOLN
Status: DC | PRN
  Administered 2024-02-19: 1000 mL

## 2024-02-19 MED ORDER — METOCLOPRAMIDE HCL 5 MG/ML IJ SOLN
5.0000 mg | Freq: Three times a day (TID) | INTRAMUSCULAR | Status: DC | PRN
  Administered 2024-02-19: 10 mg via INTRAVENOUS
  Filled 2024-02-19: qty 2

## 2024-02-19 MED ORDER — SODIUM CHLORIDE 0.9 % IV SOLN
12.5000 mg | Freq: Three times a day (TID) | INTRAVENOUS | Status: DC | PRN
  Filled 2024-02-19: qty 0.5

## 2024-02-19 MED ORDER — SODIUM CHLORIDE 0.9 % IR SOLN
Status: DC | PRN
  Administered 2024-02-19: 1000 mL

## 2024-02-19 MED ORDER — SODIUM CHLORIDE 0.9% FLUSH: 3.0000 mL | INTRAVENOUS | Status: DC | PRN

## 2024-02-19 MED ORDER — CEFAZOLIN SODIUM-DEXTROSE 2-4 GM/100ML-% IV SOLN
2.0000 g | Freq: Four times a day (QID) | INTRAVENOUS | Status: AC
  Administered 2024-02-19 (×2): 2 g via INTRAVENOUS
  Filled 2024-02-19 (×2): qty 100

## 2024-02-19 MED ORDER — FENTANYL CITRATE (PF) 250 MCG/5ML IJ SOLN
INTRAMUSCULAR | Status: DC | PRN
Start: 2024-02-19 — End: 2024-02-19
  Administered 2024-02-19 (×2): 50 ug via INTRAVENOUS

## 2024-02-19 MED ORDER — BISACODYL 10 MG RE SUPP: 10.0000 mg | Freq: Every day | RECTAL | Status: DC | PRN

## 2024-02-19 MED ORDER — TRANEXAMIC ACID-NACL 1000-0.7 MG/100ML-% IV SOLN
1000.0000 mg | INTRAVENOUS | Status: AC
  Administered 2024-02-19: 1000 mg via INTRAVENOUS
  Filled 2024-02-19: qty 100

## 2024-02-19 MED ORDER — CEFAZOLIN SODIUM-DEXTROSE 2-4 GM/100ML-% IV SOLN
2.0000 g | INTRAVENOUS | Status: AC
  Administered 2024-02-19: 2 g via INTRAVENOUS
  Filled 2024-02-19: qty 100

## 2024-02-19 MED ORDER — PHENOL 1.4 % MT LIQD: 1.0000 | OROMUCOSAL | Status: DC | PRN

## 2024-02-19 MED ORDER — SODIUM CHLORIDE (PF) 0.9 % IJ SOLN
INTRAMUSCULAR | Status: AC
  Filled 2024-02-19: qty 30

## 2024-02-19 MED ORDER — ALBUTEROL SULFATE (2.5 MG/3ML) 0.083% IN NEBU: 2.5000 mg | INHALATION_SOLUTION | Freq: Four times a day (QID) | RESPIRATORY_TRACT | Status: DC | PRN

## 2024-02-19 MED ORDER — ONDANSETRON HCL 4 MG PO TABS: 4.0000 mg | ORAL_TABLET | Freq: Four times a day (QID) | ORAL | Status: DC | PRN

## 2024-02-19 MED ORDER — FENTANYL CITRATE PF 50 MCG/ML IJ SOSY
PREFILLED_SYRINGE | INTRAMUSCULAR | Status: AC
  Filled 2024-02-19: qty 3

## 2024-02-19 MED ORDER — OXYCODONE HCL 5 MG PO TABS
5.0000 mg | ORAL_TABLET | ORAL | Status: DC | PRN
  Administered 2024-02-19 – 2024-02-20 (×2): 5 mg via ORAL
  Filled 2024-02-19 (×2): qty 1

## 2024-02-19 MED ORDER — DEXAMETHASONE SODIUM PHOSPHATE 10 MG/ML IJ SOLN
10.0000 mg | Freq: Once | INTRAMUSCULAR | Status: AC
  Administered 2024-02-20: 10 mg via INTRAVENOUS
  Filled 2024-02-19: qty 1

## 2024-02-19 MED ORDER — ROPIVACAINE HCL 7.5 MG/ML IJ SOLN
INTRAMUSCULAR | Status: DC | PRN
  Administered 2024-02-19: 20 mL

## 2024-02-19 MED ORDER — 0.9 % SODIUM CHLORIDE (POUR BTL) OPTIME
TOPICAL | Status: DC | PRN
Start: 2024-02-19 — End: 2024-02-19
  Administered 2024-02-19: 1000 mL

## 2024-02-19 MED ORDER — OXYCODONE HCL 5 MG PO TABS
ORAL_TABLET | ORAL | Status: AC
  Filled 2024-02-19: qty 1

## 2024-02-19 MED ORDER — LEVOTHYROXINE SODIUM 100 MCG PO TABS
200.0000 ug | ORAL_TABLET | Freq: Every day | ORAL | Status: DC
  Administered 2024-02-20: 200 ug via ORAL
  Filled 2024-02-19: qty 2

## 2024-02-19 MED ORDER — FENTANYL CITRATE PF 50 MCG/ML IJ SOSY
25.0000 ug | PREFILLED_SYRINGE | INTRAMUSCULAR | Status: DC | PRN
  Administered 2024-02-19: 50 ug via INTRAVENOUS

## 2024-02-19 MED ORDER — ACETAMINOPHEN 500 MG PO TABS
1000.0000 mg | ORAL_TABLET | Freq: Four times a day (QID) | ORAL | Status: DC
  Administered 2024-02-19 – 2024-02-20 (×3): 1000 mg via ORAL
  Filled 2024-02-19 (×3): qty 2

## 2024-02-19 MED ORDER — OXYCODONE HCL 5 MG PO TABS
5.0000 mg | ORAL_TABLET | Freq: Once | ORAL | Status: AC | PRN
  Administered 2024-02-19: 5 mg via ORAL

## 2024-02-19 MED ORDER — CELECOXIB 200 MG PO CAPS
200.0000 mg | ORAL_CAPSULE | Freq: Two times a day (BID) | ORAL | Status: DC
  Administered 2024-02-19 – 2024-02-20 (×2): 200 mg via ORAL
  Filled 2024-02-19 (×2): qty 1

## 2024-02-19 MED ORDER — DEXAMETHASONE SODIUM PHOSPHATE 10 MG/ML IJ SOLN: 8.0000 mg | Freq: Once | INTRAMUSCULAR | Status: DC

## 2024-02-19 MED ORDER — HYDROMORPHONE HCL 2 MG/ML IJ SOLN
INTRAMUSCULAR | Status: AC
  Filled 2024-02-19: qty 1

## 2024-02-19 MED ORDER — MIDAZOLAM HCL 2 MG/2ML IJ SOLN
INTRAMUSCULAR | Status: DC | PRN
  Administered 2024-02-19 (×2): 1 mg via INTRAVENOUS

## 2024-02-19 MED ORDER — HYDROMORPHONE HCL 1 MG/ML IJ SOLN: 0.5000 mg | INTRAMUSCULAR | Status: DC | PRN

## 2024-02-19 MED ORDER — BUPIVACAINE IN DEXTROSE 0.75-8.25 % IT SOLN
INTRATHECAL | Status: DC | PRN
  Administered 2024-02-19: 1.6 mL via INTRATHECAL

## 2024-02-19 MED ORDER — CHLORHEXIDINE GLUCONATE 0.12 % MT SOLN
15.0000 mL | Freq: Once | OROMUCOSAL | Status: AC
  Administered 2024-02-19: 15 mL via OROMUCOSAL

## 2024-02-19 MED ORDER — AMISULPRIDE (ANTIEMETIC) 5 MG/2ML IV SOLN: 10.0000 mg | Freq: Once | INTRAVENOUS | Status: DC | PRN

## 2024-02-19 MED ORDER — SENNA 8.6 MG PO TABS
2.0000 | ORAL_TABLET | Freq: Every day | ORAL | Status: DC
  Filled 2024-02-19: qty 2

## 2024-02-19 MED ORDER — MIDAZOLAM HCL 2 MG/2ML IJ SOLN
INTRAMUSCULAR | Status: AC
  Filled 2024-02-19: qty 2

## 2024-02-19 MED ORDER — METOCLOPRAMIDE HCL 5 MG PO TABS: 5.0000 mg | ORAL_TABLET | Freq: Three times a day (TID) | ORAL | Status: DC | PRN

## 2024-02-19 MED ORDER — SODIUM CHLORIDE 0.9 % IV SOLN: INTRAVENOUS | Status: DC

## 2024-02-19 MED ORDER — ASPIRIN 81 MG PO CHEW
81.0000 mg | CHEWABLE_TABLET | Freq: Two times a day (BID) | ORAL | Status: DC
Start: 2024-02-19 — End: 2024-02-20
  Administered 2024-02-19 – 2024-02-20 (×2): 81 mg via ORAL
  Filled 2024-02-19 (×2): qty 1

## 2024-02-19 MED ORDER — TRANEXAMIC ACID-NACL 1000-0.7 MG/100ML-% IV SOLN
1000.0000 mg | Freq: Once | INTRAVENOUS | Status: AC
  Administered 2024-02-19: 1000 mg via INTRAVENOUS
  Filled 2024-02-19: qty 100

## 2024-02-19 MED ORDER — SODIUM CHLORIDE (PF) 0.9 % IJ SOLN
INTRAMUSCULAR | Status: DC | PRN
  Administered 2024-02-19: 61 mL

## 2024-02-19 SURGICAL SUPPLY — 45 items
ATTUNE MED ANAT PAT 32 KNEE (Knees) IMPLANT
BAG COUNTER SPONGE SURGICOUNT (BAG) IMPLANT
BAG ZIPLOCK 12X15 (MISCELLANEOUS) ×1 IMPLANT
BASEPLATE TIB CMT FB PCKT SZ4 (Stem) IMPLANT
BLADE SAW SGTL 13.0X1.19X90.0M (BLADE) ×1 IMPLANT
BNDG ELASTIC 3X5.8 VLCR NS LF (GAUZE/BANDAGES/DRESSINGS) IMPLANT
BNDG ELASTIC 6INX 5YD STR LF (GAUZE/BANDAGES/DRESSINGS) ×1 IMPLANT
BOWL SMART MIX CTS (DISPOSABLE) ×1 IMPLANT
CEMENT HV SMART SET (Cement) ×2 IMPLANT
COMPONENT FEM CMT ATTN NRW 4RT (Joint) IMPLANT
COVER SURGICAL LIGHT HANDLE (MISCELLANEOUS) ×1 IMPLANT
CUFF TRNQT CYL 34X4.125X (TOURNIQUET CUFF) ×1 IMPLANT
DERMABOND ADVANCED .7 DNX12 (GAUZE/BANDAGES/DRESSINGS) ×1 IMPLANT
DRAPE U-SHAPE 47X51 STRL (DRAPES) ×1 IMPLANT
DRESSING AQUACEL AG SP 3.5X10 (GAUZE/BANDAGES/DRESSINGS) ×1 IMPLANT
DURAPREP 26ML APPLICATOR (WOUND CARE) ×2 IMPLANT
ELECT REM PT RETURN 15FT ADLT (MISCELLANEOUS) ×1 IMPLANT
GLOVE BIO SURGEON STRL SZ 6 (GLOVE) ×1 IMPLANT
GLOVE BIOGEL PI IND STRL 6.5 (GLOVE) ×1 IMPLANT
GLOVE BIOGEL PI IND STRL 7.5 (GLOVE) ×1 IMPLANT
GLOVE ORTHO TXT STRL SZ7.5 (GLOVE) ×2 IMPLANT
GOWN STRL REUS W/ TWL LRG LVL3 (GOWN DISPOSABLE) ×2 IMPLANT
HOLDER FOLEY CATH W/STRAP (MISCELLANEOUS) IMPLANT
INSERT TIB MED ATTUNE 4 7 RT (Insert) IMPLANT
KIT TURNOVER KIT A (KITS) ×1 IMPLANT
MANIFOLD NEPTUNE II (INSTRUMENTS) ×1 IMPLANT
NDL SAFETY ECLIPSE 18X1.5 (NEEDLE) IMPLANT
NS IRRIG 1000ML POUR BTL (IV SOLUTION) ×1 IMPLANT
PACK TOTAL KNEE CUSTOM (KITS) ×1 IMPLANT
PENCIL SMOKE EVACUATOR (MISCELLANEOUS) ×1 IMPLANT
PIN FIX SIGMA LCS THRD HI (PIN) IMPLANT
PROTECTOR NERVE ULNAR (MISCELLANEOUS) ×1 IMPLANT
SET HNDPC FAN SPRY TIP SCT (DISPOSABLE) ×1 IMPLANT
SET PAD KNEE POSITIONER (MISCELLANEOUS) ×1 IMPLANT
SPIKE FLUID TRANSFER (MISCELLANEOUS) ×2 IMPLANT
SUT MNCRL AB 4-0 PS2 18 (SUTURE) ×1 IMPLANT
SUT STRATAFIX PDS+ 0 24IN (SUTURE) ×1 IMPLANT
SUT VIC AB 1 CT1 36 (SUTURE) ×1 IMPLANT
SUT VIC AB 2-0 CT1 TAPERPNT 27 (SUTURE) ×2 IMPLANT
SYR 3ML LL SCALE MARK (SYRINGE) ×1 IMPLANT
TOWEL GREEN STERILE FF (TOWEL DISPOSABLE) ×1 IMPLANT
TRAY FOLEY MTR SLVR 16FR STAT (SET/KITS/TRAYS/PACK) ×1 IMPLANT
TUBE SUCTION HIGH CAP CLEAR NV (SUCTIONS) ×1 IMPLANT
WATER STERILE IRR 1000ML POUR (IV SOLUTION) ×2 IMPLANT
WRAP KNEE MAXI GEL POST OP (GAUZE/BANDAGES/DRESSINGS) ×1 IMPLANT

## 2024-02-19 NOTE — Discharge Instructions (Signed)

## 2024-02-19 NOTE — Interval H&P Note (Signed)
 History and Physical Interval Note:  02/19/2024 7:08 AM  Hailey [name]  has presented today for surgery, with the diagnosis of Right knee osteoarthritis.  The various methods of treatment have been discussed with the patient and family. After consideration of risks, benefits and other options for treatment, the patient has consented to  Procedure(s): ARTHROPLASTY, KNEE, TOTAL (Right) as a surgical intervention.  The patient's history has been reviewed, patient examined, no change in status, stable for surgery.  I have reviewed the patient's chart and labs.  Questions were answered to the patient's satisfaction.     Bevin Bucks

## 2024-02-19 NOTE — Anesthesia Procedure Notes (Signed)
 Anesthesia Regional Block: Adductor canal block   Pre-Anesthetic Checklist: , timeout performed,  Correct Patient, Correct Site, Correct Laterality,  Correct Procedure, Correct Position, site marked,  Risks and benefits discussed,  Surgical consent,  Pre-op evaluation,  At surgeon's request and post-op pain management  Laterality: Right  Prep: chloraprep       Needles:  Injection technique: Single-shot  Needle Type: Echogenic Needle     Needle Length: 10cm  Needle Gauge: 21     Additional Needles:   Narrative:  Start time: 02/19/2024 7:00 AM End time: 02/19/2024 7:03 AM Injection made incrementally with aspirations every 5 mL.  Performed by: Personally  Anesthesiologist: Juventino Oppenheim, MD  Additional Notes: No pain on injection. No increased resistance to injection. Injection made in 5cc increments. Good needle visualization. Patient tolerated the procedure well.

## 2024-02-19 NOTE — Op Note (Signed)
 NAME:  Hailey [name]                      MEDICAL RECORD NO.:  [id number]                             FACILITY:  [facility]      PHYSICIAN:  Azalea Lento. Bernard Brick, M.D.  DATE OF BIRTH:  03/01/1976      DATE OF PROCEDURE:  02/19/2024                                     OPERATIVE REPORT         PREOPERATIVE DIAGNOSIS:  Right knee osteoarthritis.      POSTOPERATIVE DIAGNOSIS:  Right knee osteoarthritis.      FINDINGS:  The patient was noted to have complete loss of cartilage and   bone-on-bone arthritis with associated osteophytes in the medial and patellofemoral compartments of   the knee with a significant synovitis and associated effusion.  The patient had failed months of conservative treatment including medications, injection therapy, activity modification.     PROCEDURE:  Right total knee replacement.      COMPONENTS USED:  DePuy Attune FB CR MS knee   system, a size 4N femur, 4 tibia, size 7 mm CR MS AOX insert, and 32 anatomic patellar   button.      SURGEON:  Azalea Lento. Bernard Brick, M.D.      ASSISTANT:  Kim Pen, PA-C.      ANESTHESIA:  Regional and Spinal.      SPECIMENS:  None.      COMPLICATION:  None.      DRAINS:  None.  EBL: <100 cc      TOURNIQUET TIME:  26 min at 225 mmHg     The patient was stable to the recovery room.      INDICATION FOR PROCEDURE:  Hailey [name] is a 48 y.o. female patient of   mine.  The patient had been seen, evaluated, and treated for months conservatively in the   office with medication, activity modification, and injections.  The patient had   radiographic changes of bone-on-bone arthritis with endplate sclerosis and osteophytes noted.  Based on the radiographic changes and failed conservative measures, the patient   decided to proceed with definitive treatment, total knee replacement.  Risks of infection, DVT, component failure, need for revision surgery, neurovascular injury were reviewed in the office setting.  The postop course was  reviewed stressing the efforts to maximize post-operative satisfaction and function.  Consent was obtained for benefit of pain   relief.      PROCEDURE IN DETAIL:  The patient was brought to the operative theater.   Once adequate anesthesia, preoperative antibiotics, 2 gm of Ancef ,1 gm of Tranexamic Acid , and 10 mg of Decadron  administered, the patient was positioned supine with a right thigh tourniquet placed.  The  right lower extremity was prepped and draped in sterile fashion.  A time-   out was performed identifying the patient, planned procedure, and the appropriate extremity.      The right lower extremity was placed in the [name] leg holder.  The leg was   exsanguinated, tourniquet elevated to 225 mmHg.  A midline incision was   made followed by median parapatellar arthrotomy.  Following initial   exposure, attention was  first directed to the patella.  Precut   measurement was noted to be 21 mm.  I resected down to 13 mm and used a   32 anatomic patellar button to restore patellar height as well as cover the cut surface.      The lug holes were drilled and a metal shim was placed to protect the   patella from retractors and saw blade during the procedure.      At this point, attention was now directed to the femur.  The femoral   canal was opened with a drill, irrigated to try to prevent fat emboli.  An   intramedullary rod was passed at 3 degrees valgus, 9 mm of bone was   resected off the distal femur.  Following this resection, the tibia was   subluxated anteriorly.  Using the extramedullary guide, 2 mm of bone was resected off   the proximal medial tibia.  We confirmed the gap would be   stable medially and laterally with a size 5 spacer block as well as confirmed that the tibial cut was perpendicular in the coronal plane, checking with an alignment rod.      Once this was done, I sized the femur to be a size 4 in the anterior-   posterior dimension, chose a narrow component  based on medial and   lateral dimension.  The size 4 rotation block was then pinned in   position anterior referenced using the C-clamp to set rotation.  The   anterior, posterior, and  chamfer cuts were made without difficulty nor   notching making certain that I was along the anterior cortex to help   with flexion gap stability.      The final box cut was made off the lateral aspect of distal femur.      At this point, the tibia was sized to be a size 4.  The size 4 tray was   then pinned in position through the medial third of the tubercle,   drilled, and keel punched.  Trial reduction was now carried with a 4 femur,  4 tibia, a size 7 mm CR MS insert, and the 32 anatomic patella botton.  The knee was brought to full extension with good flexion stability with the patella   tracking through the trochlea without application of pressure.  Given   all these findings the trial components removed.  Final components were   opened and cement was mixed.  The knee was irrigated with normal saline solution and pulse lavage.  The synovial lining was   then injected with 30 cc of 0.25% Marcaine  with epinephrine , 1 cc of Toradol  and 30 cc of NS for a total of 61 cc.     Final implants were then cemented onto cleaned and dried cut surfaces of bone with the knee brought to extension with a size 7 mm CR MS trial insert.      Once the cement had fully cured, excess cement was removed   throughout the knee.  I confirmed that I was satisfied with the range of   motion and stability, and the final size 7 mm CR MS AOX insert was chosen.  It was   placed into the knee.      The tourniquet had been let down at 26 minutes.  No significant   hemostasis was required.  The extensor mechanism was then reapproximated using #1 Vicryl and #1 Stratafix sutures with the knee   in flexion.  The   remaining wound was closed with 2-0 Vicryl and running 4-0 Monocryl.   The knee was cleaned, dried, dressed sterilely using  Dermabond and   Aquacel dressing.  The patient was then   brought to recovery room in stable condition, tolerating the procedure   well.   Please note that Physician Assistant, Kim Pen, PA-C was present for the entirety of the case, and was utilized for pre-operative positioning, peri-operative retractor management, general facilitation of the procedure and for primary wound closure at the end of the case.              Azalea Lento Bernard Brick, M.D.    02/19/2024 7:23 AM

## 2024-02-19 NOTE — Evaluation (Signed)
 Physical Therapy Evaluation Patient Details Name: Hailey [name] MRN: [id number] DOB: 25-Jun-1976 Today's Date: 02/19/2024  History of Present Illness  Pt is 48 yo female admitted on 01/08/24 for L TKA. Pt with hx including but not limited to dyslipidemia, lymphadenopathy, OA, ankylosing spondylitis, polyarthralgia, GERD  Clinical Impression  Pt is s/p TKA resulting in the deficits listed below (see PT Problem List). At baseline, pt is independent and working.  She recently had her R TKA and was continuing to work with PT and using a cane for ambulation.  Today, pt with good quad activation and ROM.  Pain control is fair - requested meds post therapy -notified RN.  She was able to transfer with CGA and ambulated 5' to the chair.  Pt expected to progress well with therapy.  Pt will benefit from acute skilled PT to increase their independence and safety with mobility to allow discharge.          If plan is discharge home, recommend the following: A little help with walking and/or transfers;A little help with bathing/dressing/bathroom;Help with stairs or ramp for entrance   Can travel by private vehicle        Equipment Recommendations None recommended by PT  Recommendations for Other Services       Functional Status Assessment Patient has had a recent decline in their functional status and demonstrates the ability to make significant improvements in function in a reasonable and predictable amount of time.     Precautions / Restrictions Precautions Precautions: Fall;Knee Restrictions LLE Weight Bearing Per Provider Order: Weight bearing as tolerated      Mobility  Bed Mobility Overal bed mobility: Needs Assistance Bed Mobility: Supine to Sit     Supine to sit: Supervision          Transfers Overall transfer level: Needs assistance Equipment used: Rolling walker (2 wheels) Transfers: Sit to/from Stand Sit to Stand: Contact guard assist           General transfer  comment: Cues for hand placement and R LE management; CGA for safety    Ambulation/Gait Ambulation/Gait assistance: Contact guard assist Gait Distance (Feet): 4 Feet Assistive device: Rolling walker (2 wheels) Gait Pattern/deviations: Step-to pattern, Decreased stride length, Decreased weight shift to right, Antalgic Gait velocity: decreased     General Gait Details: CGA for safety; only small steps to chair - limited by pain  Stairs            Wheelchair Mobility     Tilt Bed    Modified Rankin (Stroke Patients Only)       Balance Overall balance assessment: Needs assistance Sitting-balance support: No upper extremity supported Sitting balance-Leahy Scale: Good     Standing balance support: Bilateral upper extremity supported, Reliant on assistive device for balance Standing balance-Leahy Scale: Poor                               Pertinent Vitals/Pain Pain Assessment Pain Assessment: 0-10 Pain Score: 7  Pain Location: R knee and bladder spasms Pain Descriptors / Indicators: Sharp Pain Intervention(s): Limited activity within patient's tolerance, Monitored during session, Premedicated before session, Repositioned, Ice applied    Home Living Family/patient expects to be discharged to:: Private residence Living Arrangements: Spouse/significant other;Children Available Help at Discharge: Family;Available 24 hours/day Type of Home: House Home Access: Level entry       Home Layout: One level Home Equipment: Grab bars - tub/shower;Shower seat -  built [employer] fundraiser (2 wheels);Jeananne Mighty - single point Additional Comments: Reports home handicapped accessible    Prior Function Prior Level of Function : Independent/Modified Independent;Working/employed             Mobility Comments: could ambulate in community; still using cane after last surgery 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     Extremity/Trunk Assessment   Upper Extremity  Assessment Upper Extremity Assessment: Overall WFL for tasks assessed    Lower Extremity Assessment RLE Deficits / Details: Recent L TKA (~6 weeks prior); ROM WFL; MMT : ankle 5/5, knee ext 4+/5, hip 5/5 RLE Sensation: WNL LLE Deficits / Details: Expected post op changes; ROM knee 5 to 70 degrees; MMT: ankle 5/5, knee and hip 3/5 not further tested; able to SLR without ext lag LLE Sensation: WNL    Cervical / Trunk Assessment Cervical / Trunk Assessment: Normal  Communication        Cognition Arousal: Alert Behavior During Therapy: WFL for tasks assessed/performed   PT - Cognitive impairments: No apparent impairments                                 Cueing       General Comments      Exercises     Assessment/Plan    PT Assessment Patient needs continued PT services  PT Problem List Decreased strength;Pain;Decreased range of motion;Decreased activity tolerance;Decreased knowledge of use of DME;Decreased balance;Decreased mobility       PT Treatment Interventions DME instruction;Therapeutic exercise;Gait training;Balance training;Stair training;Functional mobility training;Therapeutic activities;Patient/family education;Modalities    PT Goals (Current goals can be found in the Care Plan section)  Acute Rehab PT Goals Patient Stated Goal: return home PT Goal Formulation: With patient Time For Goal Achievement: 03/04/24 Potential to Achieve Goals: Good    Frequency 7X/week     Co-evaluation               AM-PAC PT "6 Clicks" Mobility  Outcome Measure Help needed turning from your back to your side while in a flat bed without using bedrails?: A Little Help needed moving from lying on your back to sitting on the side of a flat bed without using bedrails?: A Little Help needed moving to and from a bed to a chair (including a wheelchair)?: A Little Help needed standing up from a chair using your arms (e.g., wheelchair or bedside chair)?: A  Little Help needed to walk in hospital room?: A Little Help needed climbing 3-5 steps with a railing? : A Lot 6 Click Score: 17    End of Session Equipment Utilized During Treatment: Gait belt Activity Tolerance: Patient tolerated treatment well Patient left: in chair;with call bell/phone within reach;with SCD's reapplied Nurse Communication: Mobility status;Other (comment) (c/o pain from catheter/bladder spasms) PT Visit Diagnosis: Other abnormalities of gait and mobility (R26.89);Muscle weakness (generalized) (M62.81)    Time: 5956-3875 PT Time Calculation (min) (ACUTE ONLY): 20 min   Charges:   PT Evaluation $PT Eval Low Complexity: 1 Low   PT General Charges $$ ACUTE PT VISIT: 1 Visit         Cyd Dowse, PT Acute Rehab [facility] [phone]   Carolynn Citrin 02/19/2024, 5:09 PM

## 2024-02-19 NOTE — Anesthesia Procedure Notes (Signed)
 Spinal  Patient location during procedure: OR Start time: 02/19/2024 7:21 AM End time: 02/19/2024 7:24 AM Reason for block: surgical anesthesia Staffing Performed: anesthesiologist  Anesthesiologist: Juventino Oppenheim, MD Performed by: Juventino Oppenheim, MD Authorized by: Juventino Oppenheim, MD   Preanesthetic Checklist Completed: patient identified, IV checked, risks and benefits discussed, surgical consent, monitors and equipment checked, pre-op evaluation and timeout performed Spinal Block Patient position: sitting Prep: DuraPrep Patient monitoring: heart rate, cardiac monitor, continuous pulse ox and blood pressure Approach: midline Location: L3-4 Injection technique: single-shot Needle Needle type: Pencan  Needle gauge: 24 G Additional Notes Consent was obtained prior to the procedure with all questions answered and concerns addressed. Risks including, but not limited to, bleeding, infection, nerve damage, paralysis, failed block, inadequate analgesia, allergic reaction, high spinal, itching, and headache were discussed and the patient wished to proceed. Functioning IV was confirmed and monitors were applied. Sterile prep and drape, including hand hygiene, mask, and sterile gloves were used. The patient was positioned and the spine was prepped. The skin was anesthetized with lidocaine . Free flow of clear CSF was obtained prior to injecting local anesthetic into the CSF. The spinal needle aspirated freely following injection. The needle was carefully withdrawn. The patient tolerated the procedure well.   Hobart Lulas, MD

## 2024-02-19 NOTE — Anesthesia Postprocedure Evaluation (Signed)
 Anesthesia Post Note  Patient: Hailey [name]  Procedure(s) Performed: ARTHROPLASTY, KNEE, TOTAL (Right: Knee)     Patient location during evaluation: PACU Anesthesia Type: Spinal Level of consciousness: awake and alert Pain management: pain level controlled Vital Signs Assessment: post-procedure vital signs reviewed and stable Respiratory status: spontaneous breathing and respiratory function stable Cardiovascular status: blood pressure returned to baseline and stable Postop Assessment: spinal receding and no apparent nausea or vomiting Anesthetic complications: no  No notable events documented.  Last Vitals:  Vitals:   02/19/24 1030 02/19/24 1054  BP: 100/74 126/72  Pulse: 70 86  Resp: 15 16  Temp: (!) 36.2 C (!) 36.4 C  SpO2: 94% 99%    Last Pain:  Vitals:   02/19/24 1054  TempSrc: Oral  PainSc: 2                  Juventino Oppenheim

## 2024-02-19 NOTE — Transfer of Care (Signed)
 Immediate Anesthesia Transfer of Care Note  Patient: Hailey [name]  Procedure(s) Performed: ARTHROPLASTY, KNEE, TOTAL (Right: Knee)  Patient Location: PACU  Anesthesia Type:MAC, Regional, and Spinal  Level of Consciousness: awake, alert , and oriented  Airway & Oxygen Therapy: Patient Spontanous Breathing and Patient connected to face mask oxygen  Post-op Assessment: Report given to RN and Post -op Vital signs reviewed and stable  Post vital signs: Reviewed and stable  Last Vitals:  Vitals Value Taken Time  BP 131/119 02/19/24 0907  Temp    Pulse 83 02/19/24 0910  Resp 15 02/19/24 0910  SpO2 100 % 02/19/24 0910  Vitals shown include unfiled device data.  Last Pain:  Vitals:   02/19/24 0558  TempSrc:   PainSc: 4       Patients Stated Pain Goal: 4 (02/19/24 0553)  Complications: No notable events documented.

## 2024-02-20 ENCOUNTER — Encounter (HOSPITAL_COMMUNITY): Payer: Self-pay | Admitting: Orthopedic Surgery

## 2024-02-20 ENCOUNTER — Other Ambulatory Visit (HOSPITAL_COMMUNITY): Payer: Self-pay

## 2024-02-20 DIAGNOSIS — Z96652 Presence of left artificial knee joint: Secondary | ICD-10-CM | POA: Diagnosis not present

## 2024-02-20 DIAGNOSIS — M1711 Unilateral primary osteoarthritis, right knee: Secondary | ICD-10-CM | POA: Diagnosis not present

## 2024-02-20 DIAGNOSIS — E039 Hypothyroidism, unspecified: Secondary | ICD-10-CM | POA: Diagnosis not present

## 2024-02-20 DIAGNOSIS — J45909 Unspecified asthma, uncomplicated: Secondary | ICD-10-CM | POA: Diagnosis not present

## 2024-02-20 DIAGNOSIS — Z79899 Other long term (current) drug therapy: Secondary | ICD-10-CM | POA: Diagnosis not present

## 2024-02-20 DIAGNOSIS — Z87891 Personal history of nicotine dependence: Secondary | ICD-10-CM | POA: Diagnosis not present

## 2024-02-20 DIAGNOSIS — Z7982 Long term (current) use of aspirin: Secondary | ICD-10-CM | POA: Diagnosis not present

## 2024-02-20 LAB — BASIC METABOLIC PANEL WITH GFR
Anion gap: 8 (ref 5–15)
BUN: 12 mg/dL (ref 6–20)
CO2: 24 mmol/L (ref 22–32)
Calcium: 9.6 mg/dL (ref 8.9–10.3)
Chloride: 104 mmol/L (ref 98–111)
Creatinine, Ser: 0.6 mg/dL (ref 0.44–1.00)
GFR, Estimated: >60 mL/min (ref >60)
Glucose, Bld: 150 mg/dL — ABNORMAL HIGH (ref 70–99)
Potassium: 4.2 mmol/L (ref 3.5–5.1)
Sodium: 136 mmol/L (ref 135–145)

## 2024-02-20 LAB — CBC
HCT: 33.4 % — ABNORMAL LOW (ref 36.0–46.0)
Hemoglobin: 10.8 g/dL — ABNORMAL LOW (ref 12.0–15.0)
MCH: 29 pg (ref 26.0–34.0)
MCHC: 32.3 g/dL (ref 30.0–36.0)
MCV: 89.8 fL (ref 80.0–100.0)
Platelets: 378 10*3/uL (ref 150–400)
RBC: 3.72 MIL/uL — ABNORMAL LOW (ref 3.87–5.11)
RDW: 14.2 % (ref 11.5–15.5)
WBC: 17.6 10*3/uL — ABNORMAL HIGH (ref 4.0–10.5)
nRBC: 0 % (ref 0.0–0.2)

## 2024-02-20 MED ORDER — NITROFURANTOIN MONOHYD MACRO 100 MG PO CAPS
100.0000 mg | ORAL_CAPSULE | Freq: Two times a day (BID) | ORAL | 0 refills | Status: AC
  Filled 2024-02-20: qty 10, 5d supply, fill #0

## 2024-02-20 NOTE — Plan of Care (Signed)
  Problem: Education: Goal: Knowledge of General Education information will improve Description: Including pain rating scale, medication(s)/side effects and non-pharmacologic comfort measures Outcome: Adequate for Discharge   Problem: Health Behavior/Discharge Planning: Goal: Ability to manage health-related needs will improve Outcome: Adequate for Discharge   Problem: Clinical Measurements: Goal: Ability to maintain clinical measurements within normal limits will improve Outcome: Progressing Goal: Will remain free from infection Outcome: Progressing Goal: Diagnostic test results will improve Outcome: Progressing Goal: Respiratory complications will improve Outcome: Progressing Goal: Cardiovascular complication will be avoided Outcome: Progressing   Problem: Activity: Goal: Risk for activity intolerance will decrease Outcome: Adequate for Discharge   Problem: Nutrition: Goal: Adequate nutrition will be maintained Outcome: Completed/Met   Problem: Coping: Goal: Level of anxiety will decrease Outcome: Progressing   Problem: Elimination: Goal: Will not experience complications related to bowel motility Outcome: Progressing Goal: Will not experience complications related to urinary retention Outcome: Completed/Met   Problem: Pain Managment: Goal: General experience of comfort will improve and/or be controlled Outcome: Progressing   Problem: Safety: Goal: Ability to remain free from injury will improve Outcome: Progressing   Problem: Skin Integrity: Goal: Risk for impaired skin integrity will decrease Outcome: Adequate for Discharge   Problem: Education: Goal: Knowledge of the prescribed therapeutic regimen will improve Outcome: Adequate for Discharge Goal: Individualized Educational Video(s) Outcome: Completed/Met   Problem: Activity: Goal: Ability to avoid complications of mobility impairment will improve Outcome: Adequate for Discharge Goal: Range of joint  motion will improve Outcome: Adequate for Discharge   Problem: Clinical Measurements: Goal: Postoperative complications will be avoided or minimized Outcome: Progressing   Problem: Pain Management: Goal: Pain level will decrease with appropriate interventions Outcome: Progressing   Problem: Skin Integrity: Goal: Will show signs of wound healing Outcome: Progressing

## 2024-02-20 NOTE — Progress Notes (Signed)
 Subjective: 1 Day Post-Op Procedure(s) (LRB): ARTHROPLASTY, KNEE, TOTAL (Right) Patient reports pain as mild.   Patient seen in rounds by Dr. Bernard Brick. Patient is well, and has had no acute complaints or problems. No acute events overnight. Foley catheter removed. Patient ambulated 5 feet with PT.  We will start therapy today.   Objective: Vital signs in last 24 hours: Temp:  [97 F (36.1 C)-98.1 F (36.7 C)] 98.1 F (36.7 C) (06/11 0555) Pulse Rate:  [70-101] 78 (06/11 0555) Resp:  [5-33] 18 (06/11 0555) BP: (100-131)/(50-119) 106/67 (06/11 0555) SpO2:  [91 %-100 %] 100 % (06/11 0555)  Intake/Output from previous day:  Intake/Output Summary (Last 24 hours) at 02/20/2024 0801 Last data filed at 02/20/2024 0555 Gross per 24 hour  Intake 1428.45 ml  Output 2600 ml  Net -1171.55 ml     Intake/Output this shift: No intake/output data recorded.  Labs: Recent Labs    02/20/24 0347  HGB 10.8*   Recent Labs    02/20/24 0347  WBC 17.6*  RBC 3.72*  HCT 33.4*  PLT 378   Recent Labs    02/20/24 0347  NA 136  K 4.2  CL 104  CO2 24  BUN 12  CREATININE 0.60  GLUCOSE 150*  CALCIUM 9.6   No results for input(s): LABPT, INR in the last 72 hours.  Exam: General - Patient is Alert and Oriented Extremity - Neurologically intact Sensation intact distally Intact pulses distally Dorsiflexion/Plantar flexion intact Dressing - dressing C/D/I Motor Function - intact, moving foot and toes well on exam.   Past Medical History:  Diagnosis Date   Allergic state    Allergy    Ankylosing spondylitis (HCC)    followed by rheumatology Dr. Lydia Sams   Arthritis    Asthma    Cervical disc disease    Chest pain    Depression    Enlarged lymph nodes    GERD (gastroesophageal reflux disease)    H/O colonoscopy    HPV (human papilloma virus) infection    Hypothyroidism    Interstitial cystitis    LGSIL (low grade squamous intraepithelial dysplasia)    Paresthesia     Pre-diabetes    Status post laparoscopic cholecystectomy 07/13/2016    Assessment/Plan: 1 Day Post-Op Procedure(s) (LRB): ARTHROPLASTY, KNEE, TOTAL (Right) Principal Problem:   S/P total knee arthroplasty, right  Estimated body mass index is 38.11 kg/m as calculated from the following:   Height as of this encounter: 5' 5 (1.651 m).   Weight as of this encounter: 103.9 kg. Advance diet Up with therapy D/C IV fluids   Patient's anticipated LOS is less than 2 midnights, meeting these requirements: - Younger than 58 - Lives within 1 hour of care - Has a competent adult at home to recover with post-op recover - NO history of  - Chronic pain requiring opiods  - Diabetes  - Coronary Artery Disease  - Heart failure  - Heart attack  - Stroke  - DVT/VTE  - Cardiac arrhythmia  - Respiratory Failure/COPD  - Renal failure  - Anemia  - Advanced Liver disease     DVT Prophylaxis - Aspirin  Weight bearing as tolerated.  Hgb stable at 10.8 this AM  Plan is to go Home after hospital stay. Plan for discharge today following 1-2 sessions of PT as long as they are meeting their goals. Patient is scheduled for OPPT. Follow up in the office in 2 weeks.   Meds sent ahead and picked up already  by patient  Kim Pen, PA-C Orthopedic Surgery [phone] 02/20/2024, 8:01 AM

## 2024-02-20 NOTE — Progress Notes (Signed)
 Physical Therapy Treatment Patient Details Name: Hailey [name] MRN: [id number] DOB: 10-Aug-1976 Today's Date: 02/20/2024   History of Present Illness Pt is 48 yo female admitted on 01/08/24 for L TKA. Pt with hx including but not limited to dyslipidemia, lymphadenopathy, OA, ankylosing spondylitis, polyarthralgia, GERD    PT Comments  Pt is POD # 1 and is progressing well. She has good pain control, quad activation, ROM, and understanding of HEP and safety.  She was able to ambulate 120' with RW and supervision. Has no steps.  Pt very familiar with rehab progress as she just had L TKA a few weeks ago and is scheduled for outpt PT this week.   Pt demonstrates safe gait & transfers in order to return home from PT perspective once discharged by MD.  While in hospital, will continue to benefit from PT for skilled therapy to advance mobility and exercises.       If plan is discharge home, recommend the following: A little help with walking and/or transfers;A little help with bathing/dressing/bathroom;Help with stairs or ramp for entrance   Can travel by private vehicle        Equipment Recommendations  None recommended by PT    Recommendations for Other Services       Precautions / Restrictions Precautions Precautions: Fall;Knee Restrictions LLE Weight Bearing Per Provider Order: Weight bearing as tolerated     Mobility  Bed Mobility Overal bed mobility: Needs Assistance Bed Mobility: Supine to Sit     Supine to sit: Supervision     General bed mobility comments: self assisted R leg with gait belt    Transfers Overall transfer level: Needs assistance Equipment used: Rolling walker (2 wheels) Transfers: Sit to/from Stand Sit to Stand: Supervision           General transfer comment: good recall of hand placement    Ambulation/Gait Ambulation/Gait assistance: Supervision Gait Distance (Feet): 120 Feet Assistive device: Rolling walker (2 wheels) Gait Pattern/deviations:  Step-to pattern, Decreased stride length Gait velocity: decreased but functional     General Gait Details: Steady step to gait pattern   Stairs             Wheelchair Mobility     Tilt Bed    Modified Rankin (Stroke Patients Only)       Balance Overall balance assessment: Needs assistance Sitting-balance support: No upper extremity supported Sitting balance-Leahy Scale: Good     Standing balance support: Bilateral upper extremity supported, Reliant on assistive device for balance, No upper extremity supported Standing balance-Leahy Scale: Fair Standing balance comment: RW to ambulate but could stand without UE support                            Communication    Cognition Arousal: Alert Behavior During Therapy: WFL for tasks assessed/performed   PT - Cognitive impairments: No apparent impairments                                Cueing    Exercises Total Joint Exercises Ankle Circles/Pumps: AROM, Both, 10 reps, Supine Quad Sets: AROM, Both, 10 reps, Supine Heel Slides: 10 reps, Supine, Right, AAROM Hip ABduction/ADduction: 10 reps, Supine, Right, AAROM Long Arc Quad: AROM, 10 reps, Seated, Right Knee Flexion: AROM, 10 reps, Seated, Right Goniometric ROM: R knee 3 to 70 degrees Other Exercises Other Exercises: educated on AAROM as needed  General Comments General comments (skin integrity, edema, etc.): Reports had Lateral L knee pain that started Sat/Sun after therapy session on Friday.  States sharp pain that radiates down to ankle and up lateral thigh that has occurred during toe off or when she slides leg out to side in bed will feel a pop -lateral knee - and then pain.  She did not have this pain today.  Discussed continuing to monitor and educated on gentle IT band stretches, massage, and ice.  Educated on safe ice use, no pivots, car transfers, resting with leg straight, and TED hose during day. Also, encouraged walking every 1-2  hours during day. Educated on HEP with focus on mobility the first weeks. Discussed doing exercises within pain control and if pain increasing could decreased ROM, reps, and stop exercises as needed. Encouraged to perform quad sets and ankle pumps frequently for blood flow and to promote full knee extension.       Pertinent Vitals/Pain Pain Assessment Pain Assessment: 0-10 Pain Score: 2  Pain Location: R knee Pain Descriptors / Indicators: Discomfort Pain Intervention(s): Limited activity within patient's tolerance, Monitored during session, Premedicated before session, Repositioned, Ice applied    Home Living                          Prior Function            PT Goals (current goals can now be found in the care plan section) Progress towards PT goals: Progressing toward goals    Frequency    7X/week      PT Plan      Co-evaluation              AM-PAC PT 6 Clicks Mobility   Outcome Measure  Help needed turning from your back to your side while in a flat bed without using bedrails?: A Little Help needed moving from lying on your back to sitting on the side of a flat bed without using bedrails?: A Little Help needed moving to and from a bed to a chair (including a wheelchair)?: A Little Help needed standing up from a chair using your arms (e.g., wheelchair or bedside chair)?: A Little Help needed to walk in hospital room?: A Little Help needed climbing 3-5 steps with a railing? : A Little 6 Click Score: 18    End of Session Equipment Utilized During Treatment: Gait belt Activity Tolerance: Patient tolerated treatment well Patient left: in chair;with call bell/phone within reach Nurse Communication: Mobility status PT Visit Diagnosis: Other abnormalities of gait and mobility (R26.89);Muscle weakness (generalized) (M62.81)     Time: 1022-1050 PT Time Calculation (min) (ACUTE ONLY): 28 min  Charges:    $Gait Training: 8-22 mins $Therapeutic  Exercise: 8-22 mins PT General Charges $$ ACUTE PT VISIT: 1 Visit                     Cyd Dowse, PT Acute Rehab Services [facility] [phone]    Carolynn Citrin 02/20/2024, 12:09 PM

## 2024-02-20 NOTE — TOC Initial Note (Signed)
 Transition of Care [facility]) - Initial/Assessment Note    Patient Details  Name: Hailey [name] MRN: [id number] Date of Birth: [birth year]  Transition of Care [facility]) CM/SW Contact:    Levie Ream, RN Phone Number: 02/20/2024, 11:43 AM  Clinical Narrative:                 Hailey [name] w/ pt in room; pt says she lives at home w/ her husband [name] [phone]); she plans to return at d/c; her husband will provide transportation; pt verified insurance/PCP; she denied SDOH risks; pt says she has cane, walker, and BSC; she does not have HH services or home oxygen; no TOC needs; TOC signing off; please place consult if needed.  Expected Discharge Plan: Home/Self Care Barriers to Discharge: No Barriers Identified   Patient Goals and CMS Choice Patient states their goals for this hospitalization and ongoing recovery are:: home          Expected Discharge Plan and Services   Discharge Planning Services: CM Consult   Living arrangements for the past 2 months: Single Family Home Expected Discharge Date: 02/20/24               DME Arranged: N/A DME Agency: NA       HH Arranged: NA HH Agency: NA        Prior Living Arrangements/Services Living arrangements for the past 2 months: Single Family Home Lives with:: Spouse Patient language and need for interpreter reviewed:: Yes Do you feel safe going back to the place where you live?: Yes      Need for Family Participation in Patient Care: Yes (Comment) Care giver support system in place?: Yes (comment) Current home services: DME (cane, walker, BSC) Criminal Activity/Legal Involvement Pertinent to Current Situation/Hospitalization: No - Comment as needed  Activities of Daily Living   ADL Screening (condition at time of admission) Independently performs ADLs?: Yes (appropriate for developmental age) Is the patient deaf or have difficulty hearing?: No Does the patient have difficulty seeing, even when wearing  glasses/contacts?: No Does the patient have difficulty concentrating, remembering, or making decisions?: No  Permission Sought/Granted Permission sought to share information with : Case Manager Permission granted to share information with : Yes, Verbal Permission Granted  Share Information with NAME: Case Manager     Permission granted to share info w Relationship: Hailey [name] (spouse) [phone]     Emotional Assessment Appearance:: Appears stated age Attitude/Demeanor/Rapport: Gracious Affect (typically observed): Accepting Orientation: : Oriented to Self, Oriented to Place, Oriented to  Time, Oriented to Situation Alcohol / Substance Use: Not Applicable Psych Involvement: No (comment)  Admission diagnosis:  Primary osteoarthritis of right knee [M17.11] S/P total knee arthroplasty, right [Z96.651] Patient Active Problem List   Diagnosis Date Noted   S/P total knee arthroplasty, right 02/19/2024   S/P total knee arthroplasty, left 01/08/2024   S/P total knee arthroplasty 01/08/2024   Atypical chest pain 11/23/2023   Preop cardiovascular exam 11/23/2023   Dyslipidemia 11/23/2023   Generalized lymphadenopathy 09/23/2021   B12 deficiency 09/23/2021   Fatigue 09/23/2021   Primary osteoarthritis of both knees 04/29/2021   Ankylosing spondylitis (HCC) 12/30/2018   Asthma in adult without complication 12/30/2018   Chronic diarrhea 12/30/2018   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 [address] [place] [address] Phone: [phone] Fax: [phone]  [facility] [address] [place]  [address] Phone: [phone] Fax: [phone]  [facility] [phone] [place], [place] - [address] [place] [address] Phone: [phone] Fax: [phone]  [id number] [place], [place] - [place] [address] AT [facility] OF [address] [facility] [address] [place] [address] Phone: [phone] Fax: [phone]  [facility] #[id number], [place] - [address] AT [facility] [address] [facility] [place] [address] Phone: [phone] Fax: [phone]     Social Drivers of Health (SDOH) Social History: SDOH Screenings   Food Insecurity: No Food Insecurity (02/20/2024)  Housing: Low Risk  (02/20/2024)  Transportation Needs: No Transportation Needs (02/20/2024)  Utilities: Not At Risk (02/20/2024)  Depression (PHQ2-9): Medium Risk (04/04/2022)  Tobacco Use: Medium Risk (02/19/2024)   SDOH Interventions: Food Insecurity Interventions: Intervention Not Indicated, Inpatient TOC Housing Interventions: Intervention Not Indicated, Inpatient TOC Transportation Interventions: Intervention Not Indicated, Inpatient TOC Utilities Interventions: Intervention Not Indicated, Inpatient TOC   Readmission Risk Interventions     No data to display

## 2024-02-20 NOTE — Plan of Care (Signed)
 Problem: Health Behavior/Discharge Planning: Goal: Ability to manage health-related needs will improve Outcome: Progressing   Problem: Clinical Measurements: Goal: Ability to maintain clinical measurements within normal limits will improve Outcome: Progressing   Problem: Activity: Goal: Risk for activity intolerance will decrease Outcome: Progressing   Problem: Coping: Goal: Level of anxiety will decrease Outcome: Progressing   Problem: Pain Managment: Goal: General experience of comfort will improve and/or be controlled Outcome: Progressing   Problem: Safety: Goal: Ability to remain free from injury will improve Outcome: Progressing   Ara Knee, RN 02/20/24 10:13 AM

## 2024-02-20 NOTE — Plan of Care (Signed)
 Patient discharging home via private vehicle with husband following delivery of med from pharmacy and collection of urine for culture. AVS and discharge instruction provided. Patient verbalizes understanding. Ara Knee, RN 02/20/24 11:47 AM

## 2024-02-21 ENCOUNTER — Ambulatory Visit: Admitting: Physical Therapy

## 2024-02-21 LAB — URINE CULTURE: Culture: NO GROWTH

## 2024-02-22 ENCOUNTER — Other Ambulatory Visit: Payer: Self-pay

## 2024-02-22 ENCOUNTER — Emergency Department (HOSPITAL_COMMUNITY)
Admission: EM | Admit: 2024-02-22 | Discharge: 2024-02-22 | Disposition: A | Discharge status: Home / Self Care | Attending: Emergency Medicine | Admitting: Emergency Medicine

## 2024-02-22 ENCOUNTER — Emergency Department (HOSPITAL_COMMUNITY)

## 2024-02-22 ENCOUNTER — Encounter (HOSPITAL_COMMUNITY): Payer: Self-pay | Admitting: Emergency Medicine

## 2024-02-22 DIAGNOSIS — D72829 Elevated white blood cell count, unspecified: Secondary | ICD-10-CM | POA: Insufficient documentation

## 2024-02-22 DIAGNOSIS — R7309 Other abnormal glucose: Secondary | ICD-10-CM | POA: Diagnosis not present

## 2024-02-22 DIAGNOSIS — Z7982 Long term (current) use of aspirin: Secondary | ICD-10-CM | POA: Diagnosis not present

## 2024-02-22 DIAGNOSIS — Z91040 Latex allergy status: Secondary | ICD-10-CM | POA: Diagnosis not present

## 2024-02-22 DIAGNOSIS — N83201 Unspecified ovarian cyst, right side: Secondary | ICD-10-CM | POA: Diagnosis not present

## 2024-02-22 DIAGNOSIS — R55 Syncope and collapse: Secondary | ICD-10-CM | POA: Diagnosis not present

## 2024-02-22 DIAGNOSIS — K5900 Constipation, unspecified: Secondary | ICD-10-CM | POA: Diagnosis not present

## 2024-02-22 DIAGNOSIS — R110 Nausea: Secondary | ICD-10-CM | POA: Diagnosis not present

## 2024-02-22 DIAGNOSIS — N838 Other noninflammatory disorders of ovary, fallopian tube and broad ligament: Secondary | ICD-10-CM | POA: Diagnosis not present

## 2024-02-22 DIAGNOSIS — D75839 Thrombocytosis, unspecified: Secondary | ICD-10-CM | POA: Insufficient documentation

## 2024-02-22 DIAGNOSIS — D649 Anemia, unspecified: Secondary | ICD-10-CM | POA: Insufficient documentation

## 2024-02-22 DIAGNOSIS — Z96651 Presence of right artificial knee joint: Secondary | ICD-10-CM | POA: Insufficient documentation

## 2024-02-22 DIAGNOSIS — N888 Other specified noninflammatory disorders of cervix uteri: Secondary | ICD-10-CM | POA: Diagnosis not present

## 2024-02-22 DIAGNOSIS — J45909 Unspecified asthma, uncomplicated: Secondary | ICD-10-CM | POA: Diagnosis not present

## 2024-02-22 DIAGNOSIS — R7881 Bacteremia: Secondary | ICD-10-CM | POA: Diagnosis not present

## 2024-02-22 DIAGNOSIS — D259 Leiomyoma of uterus, unspecified: Secondary | ICD-10-CM | POA: Diagnosis not present

## 2024-02-22 DIAGNOSIS — E039 Hypothyroidism, unspecified: Secondary | ICD-10-CM | POA: Diagnosis not present

## 2024-02-22 DIAGNOSIS — E860 Dehydration: Secondary | ICD-10-CM | POA: Insufficient documentation

## 2024-02-22 DIAGNOSIS — R1031 Right lower quadrant pain: Secondary | ICD-10-CM | POA: Diagnosis not present

## 2024-02-22 DIAGNOSIS — R531 Weakness: Secondary | ICD-10-CM | POA: Diagnosis present

## 2024-02-22 LAB — COMPREHENSIVE METABOLIC PANEL WITH GFR
ALT: 15 U/L (ref 0–44)
AST: 17 U/L (ref 15–41)
Albumin: 3.8 g/dL (ref 3.5–5.0)
Alkaline Phosphatase: 120 U/L (ref 38–126)
Anion gap: 11 (ref 5–15)
BUN: 13 mg/dL (ref 6–20)
CO2: 24 mmol/L (ref 22–32)
Calcium: 9.1 mg/dL (ref 8.9–10.3)
Chloride: 102 mmol/L (ref 98–111)
Creatinine, Ser: 0.78 mg/dL (ref 0.44–1.00)
GFR, Estimated: >60 mL/min (ref >60)
Glucose, Bld: 115 mg/dL — ABNORMAL HIGH (ref 70–99)
Potassium: 3.8 mmol/L (ref 3.5–5.1)
Sodium: 137 mmol/L (ref 135–145)
Total Bilirubin: 0.6 mg/dL (ref 0.0–1.2)
Total Protein: 7.4 g/dL (ref 6.5–8.1)

## 2024-02-22 LAB — URINALYSIS, ROUTINE W REFLEX MICROSCOPIC
Bilirubin Urine: NEGATIVE
Glucose, UA: NEGATIVE mg/dL
Hgb urine dipstick: NEGATIVE
Ketones, ur: NEGATIVE mg/dL
Leukocytes,Ua: NEGATIVE
Nitrite: NEGATIVE
Protein, ur: NEGATIVE mg/dL
Specific Gravity, Urine: 1.01 (ref 1.005–1.030)
pH: 6 (ref 5.0–8.0)

## 2024-02-22 LAB — CBC WITH DIFFERENTIAL/PLATELET
Abs Immature Granulocytes: 0.06 10*3/uL (ref 0.00–0.07)
Basophils Absolute: 0.1 10*3/uL (ref 0.0–0.1)
Basophils Relative: 1 %
Eosinophils Absolute: 0.3 10*3/uL (ref 0.0–0.5)
Eosinophils Relative: 2 %
HCT: 36.6 % (ref 36.0–46.0)
Hemoglobin: 11.8 g/dL — ABNORMAL LOW (ref 12.0–15.0)
Immature Granulocytes: 1 %
Lymphocytes Relative: 25 %
Lymphs Abs: 3 10*3/uL (ref 0.7–4.0)
MCH: 28.3 pg (ref 26.0–34.0)
MCHC: 32.2 g/dL (ref 30.0–36.0)
MCV: 87.8 fL (ref 80.0–100.0)
Monocytes Absolute: 0.9 10*3/uL (ref 0.1–1.0)
Monocytes Relative: 8 %
Neutro Abs: 7.4 10*3/uL (ref 1.7–7.7)
Neutrophils Relative %: 63 %
Platelets: 459 10*3/uL — ABNORMAL HIGH (ref 150–400)
RBC: 4.17 MIL/uL (ref 3.87–5.11)
RDW: 14.5 % (ref 11.5–15.5)
WBC: 11.7 10*3/uL — ABNORMAL HIGH (ref 4.0–10.5)
nRBC: 0 % (ref 0.0–0.2)

## 2024-02-22 LAB — TROPONIN I (HIGH SENSITIVITY)
Troponin I (High Sensitivity): 2 ng/L (ref <18)
Troponin I (High Sensitivity): 3 ng/L (ref <18)

## 2024-02-22 LAB — MAGNESIUM: Magnesium: 1.9 mg/dL (ref 1.7–2.4)

## 2024-02-22 LAB — CBG MONITORING, ED: Glucose-Capillary: 120 mg/dL — ABNORMAL HIGH (ref 70–99)

## 2024-02-22 LAB — RESP PANEL BY RT-PCR (RSV, FLU A&B, COVID)  RVPGX2
Influenza A by PCR: NEGATIVE
Influenza B by PCR: NEGATIVE
Resp Syncytial Virus by PCR: NEGATIVE
SARS Coronavirus 2 by RT PCR: NEGATIVE

## 2024-02-22 LAB — PREGNANCY, URINE: Preg Test, Ur: NEGATIVE

## 2024-02-22 MED ORDER — LACTATED RINGERS IV BOLUS
1000.0000 mL | Freq: Once | INTRAVENOUS | Status: AC
  Administered 2024-02-22: 1000 mL via INTRAVENOUS

## 2024-02-22 MED ORDER — METOCLOPRAMIDE HCL 10 MG PO TABS: 10.0000 mg | ORAL_TABLET | Freq: Four times a day (QID) | ORAL | 0 refills | Status: DC | PRN

## 2024-02-22 MED ORDER — MORPHINE SULFATE (PF) 4 MG/ML IV SOLN
4.0000 mg | Freq: Once | INTRAVENOUS | Status: AC
  Administered 2024-02-22: 4 mg via INTRAVENOUS
  Filled 2024-02-22: qty 1

## 2024-02-22 MED ORDER — METOCLOPRAMIDE HCL 10 MG PO TABS
10.0000 mg | ORAL_TABLET | Freq: Four times a day (QID) | ORAL | 0 refills | Status: DC | PRN
  Filled 2024-02-22: qty 10, 3d supply, fill #0

## 2024-02-22 MED ORDER — IOHEXOL 300 MG/ML  SOLN
100.0000 mL | Freq: Once | INTRAMUSCULAR | Status: AC | PRN
  Administered 2024-02-22: 100 mL via INTRAVENOUS

## 2024-02-22 MED ORDER — FENTANYL CITRATE PF 50 MCG/ML IJ SOSY
50.0000 ug | PREFILLED_SYRINGE | Freq: Once | INTRAMUSCULAR | Status: AC
  Administered 2024-02-22: 50 ug via INTRAVENOUS
  Filled 2024-02-22: qty 1

## 2024-02-22 MED ORDER — PROMETHAZINE HCL 25 MG PO TABS
25.0000 mg | ORAL_TABLET | Freq: Three times a day (TID) | ORAL | 0 refills | Status: DC | PRN
  Filled 2024-02-22: qty 30, 10d supply, fill #0

## 2024-02-22 MED ORDER — SODIUM CHLORIDE 0.9 % IV SOLN
12.5000 mg | INTRAVENOUS | Status: AC
  Administered 2024-02-22: 12.5 mg via INTRAVENOUS
  Filled 2024-02-22: qty 12.5

## 2024-02-22 MED ORDER — ONDANSETRON HCL 4 MG/2ML IJ SOLN
4.0000 mg | Freq: Once | INTRAMUSCULAR | Status: AC
  Administered 2024-02-22: 4 mg via INTRAVENOUS
  Filled 2024-02-22: qty 2

## 2024-02-22 NOTE — ED Triage Notes (Signed)
 Pt came in from home, c/o increased weakness, nausea, and near syncopal episodes since yesterday. Had total knee surgery Tuesday. Left messages with office with no return phone call.

## 2024-02-22 NOTE — ED Provider Notes (Signed)
 I provided a substantive portion of the care of this patient.  I personally made/approved the management plan for this patient and take responsibility for the patient management.  EKG Interpretation Date/Time:  Friday February 22 2024 15:13:06 EDT Ventricular Rate:  83 PR Interval:    QRS Duration:  108 QT Interval:  388 QTC Calculation: 456 R Axis:   -3  Text Interpretation: Normal sinus rhythm Abnormal R-wave progression, early transition Confirmed by Lind Repine ([id number]) on 02/22/2024 6:31:73 PM   48 year old female presents for abdominal discomfort.  Some weakness and nausea and syncope.  Abdominal exam shows some mild diffuse tenderness.  Will obtain abdominal CT and reassess   Lind Repine, MD 02/22/24 2013

## 2024-02-22 NOTE — ED Provider Notes (Incomplete)
 [place] EMERGENCY DEPARTMENT AT [facility] Provider Note   CSN: [id number] Arrival date & time: 02/22/24  1454     Patient presents with: No chief complaint on file.   Hailey [name] is a 48 y.o. female.  {Add pertinent medical, surgical, social history, OB history to HPI:32947} HPI     Prior to Admission medications   Medication Sig Start Date End Date Taking? Authorizing Provider  albuterol  (VENTOLIN  HFA) 108 (90 Base) MCG/ACT inhaler Inhale 2 puffs into the lungs every 6 (six) hours as needed for wheezing or shortness of breath. 10/12/23   Sheron Dixons, MD  aspirin  81 MG chewable tablet Chew 1 tablet (81 mg total) by mouth 2 (two) times daily. 02/18/24     celecoxib  (CELEBREX ) 200 MG capsule Take 1 capsule (200 mg total) by mouth 2 (two) times daily with a meal. 12/24/23     fluticasone -salmeterol (ADVAIR  DISKUS) 100-50 MCG/ACT AEPB Inhale 1 puff into the lungs 2 (two) times daily as needed. Patient taking differently: Inhale 1 puff into the lungs daily as needed (SOB). 10/12/23   Sheron Dixons, MD  hydrOXYzine  (ATARAX ) 25 MG tablet Take 1 tablet by mouth at bedtime as needed for sleep 02/07/24     methocarbamol  (ROBAXIN ) 500 MG tablet Take 1 tablet (500 mg total) by mouth every 6 (six) hours as needed. 12/24/23     Multiple Vitamin (MULTIVITAMIN WITH MINERALS) TABS tablet Take 1 tablet by mouth daily.    [provider]  nitrofurantoin , macrocrystal-monohydrate, (MACROBID ) 100 MG capsule Take 1 capsule (100 mg total) by mouth 2 (two) times daily for 5 days. 02/20/24 02/25/24  Earnie Gola, PA-C  nystatin  cream (MYCOSTATIN ) Apply 1 Application topically 2 (two) times daily as needed (rash).    [provider]  ondansetron  (ZOFRAN -ODT) 4 MG disintegrating tablet Take 1 tablet by mouth every 6 hours as needed for nausea/vomiting 02/18/24     oxyCODONE  (OXY IR/ROXICODONE ) 5 MG immediate release tablet Take 1 tablet (5 mg total) by mouth every 4 (four)  hours as needed for severe pain. 02/18/24     polyethylene glycol powder (GLYCOLAX /MIRALAX ) 17 GM/SCOOP powder Mix 1 capful (17 grams) in liquid and drink by mouth 2 (two) times daily. 02/18/24     senna (SENOKOT) 8.6 MG tablet Take 2 tablets (17.2 mg total) by mouth at bedtime for 14 days. 02/18/24 03/03/24    SYNTHROID  200 MCG tablet Take 1 tablet (200 mcg total) by mouth once daily. Take on an empty stomach with a glass of water  at least 30-60 minutes before breakfast. 06/18/23     tiZANidine  (ZANAFLEX ) 4 MG tablet Take 1 tablet (4 mg total) by mouth at bedtime as needed for muscle spasm or pain 02/13/24       Allergies: Levofloxacin, Lodine [etodolac], Adalimumab , Ciprofloxacin, and Latex    Review of Systems  Updated Vital Signs BP (!) 146/97 (BP Location: Right Arm)   Pulse 86   Temp 98.2 F (36.8 C) (Oral)   Resp 18   LMP 11/27/2023   SpO2 96%   Physical Exam  (all labs ordered are listed, but only abnormal results are displayed) Labs Reviewed - No data to display  EKG: None  Radiology: No results found.  {Document cardiac monitor, telemetry assessment procedure when appropriate:32947} Procedures   Medications Ordered in the ED - No data to display  Clinical Course as of 02/22/24 1938  Fri Feb 22, 2024  1532 More brain fatigue. Total weakness. No  SOB. Feels nausea, no vomiting. Constipated.  [RR]    Clinical Course User Index [RR] Spence Dux, PA-C   {Click here for ABCD2, HEART and other calculators REFRESH Note before signing:1}                              Medical Decision Making Amount and/or Complexity of Data Reviewed Labs: ordered. Radiology: ordered.  Risk Prescription drug management.   ***  {Document critical care time when appropriate  Document review of labs and clinical decision tools ie CHADS2VASC2, etc  Document your independent review of radiology images and any outside records  Document your discussion with family members, caretakers and with  consultants  Document social determinants of health affecting pt's care  Document your decision making why or why not admission, treatments were needed:32947:::1}   Final diagnoses:  None    ED Discharge Orders     None

## 2024-02-22 NOTE — Discharge Instructions (Addendum)
 You were seen in the ER today for evaluation of your nausea and generalized weakness. I am glad that you are feeling better. You can try your Zofran  or Phenergan  at home to see if this helps with your nausea. I will send you in Reglan , which is an anti nausea medication if the Zofran  or Phenergan  doesn't help. You have an ovarian cyst. Please make sure that you follow up with your gynecologist on this soon. Make sure that you are staying well hydrated drinking plenty of fluids, mainly water . Please adhere to the medication regimen given to you by Dr. Marcelle Sergeant office. You can reach out to them for additional medications/recommendations. Make sure that you are resting and elevating your leg. I have included additional information into this paperwork for you to review. If you have any concerns, new or worsening symptoms, please return to the nearest ER for re-evaluation.   Contact a health care provider if: You have any new symptoms. Your symptoms get worse. Get help right away if: You have really bad pain in the belly or pelvis, and the pain comes on all of a sudden. You have heavy bleeding that soaks through more than one pad every hour.

## 2024-02-23 ENCOUNTER — Emergency Department (HOSPITAL_COMMUNITY)

## 2024-02-23 ENCOUNTER — Emergency Department (HOSPITAL_COMMUNITY)
Admission: EM | Admit: 2024-02-23 | Discharge: 2024-02-23 | Disposition: A | Discharge status: Home / Self Care | Attending: Emergency Medicine | Admitting: Emergency Medicine

## 2024-02-23 ENCOUNTER — Other Ambulatory Visit (HOSPITAL_COMMUNITY): Payer: Self-pay

## 2024-02-23 ENCOUNTER — Encounter (HOSPITAL_COMMUNITY): Payer: Self-pay

## 2024-02-23 ENCOUNTER — Other Ambulatory Visit: Payer: Self-pay

## 2024-02-23 DIAGNOSIS — R109 Unspecified abdominal pain: Secondary | ICD-10-CM | POA: Insufficient documentation

## 2024-02-23 DIAGNOSIS — R350 Frequency of micturition: Secondary | ICD-10-CM | POA: Insufficient documentation

## 2024-02-23 DIAGNOSIS — Z7982 Long term (current) use of aspirin: Secondary | ICD-10-CM | POA: Insufficient documentation

## 2024-02-23 DIAGNOSIS — Z96653 Presence of artificial knee joint, bilateral: Secondary | ICD-10-CM | POA: Insufficient documentation

## 2024-02-23 DIAGNOSIS — J45909 Unspecified asthma, uncomplicated: Secondary | ICD-10-CM | POA: Insufficient documentation

## 2024-02-23 DIAGNOSIS — R110 Nausea: Secondary | ICD-10-CM | POA: Diagnosis not present

## 2024-02-23 DIAGNOSIS — Z79899 Other long term (current) drug therapy: Secondary | ICD-10-CM | POA: Diagnosis not present

## 2024-02-23 DIAGNOSIS — R1084 Generalized abdominal pain: Secondary | ICD-10-CM | POA: Diagnosis not present

## 2024-02-23 DIAGNOSIS — R531 Weakness: Secondary | ICD-10-CM | POA: Insufficient documentation

## 2024-02-23 DIAGNOSIS — R251 Tremor, unspecified: Secondary | ICD-10-CM | POA: Diagnosis not present

## 2024-02-23 DIAGNOSIS — Z91040 Latex allergy status: Secondary | ICD-10-CM | POA: Diagnosis not present

## 2024-02-23 DIAGNOSIS — K429 Umbilical hernia without obstruction or gangrene: Secondary | ICD-10-CM | POA: Diagnosis not present

## 2024-02-23 DIAGNOSIS — J069 Acute upper respiratory infection, unspecified: Secondary | ICD-10-CM | POA: Diagnosis not present

## 2024-02-23 DIAGNOSIS — E039 Hypothyroidism, unspecified: Secondary | ICD-10-CM | POA: Diagnosis not present

## 2024-02-23 DIAGNOSIS — Z7951 Long term (current) use of inhaled steroids: Secondary | ICD-10-CM | POA: Diagnosis not present

## 2024-02-23 DIAGNOSIS — N83201 Unspecified ovarian cyst, right side: Secondary | ICD-10-CM | POA: Diagnosis not present

## 2024-02-23 LAB — CBC WITH DIFFERENTIAL/PLATELET
Abs Immature Granulocytes: 0.04 10*3/uL (ref 0.00–0.07)
Basophils Absolute: 0.1 10*3/uL (ref 0.0–0.1)
Basophils Relative: 1 %
Eosinophils Absolute: 0.3 10*3/uL (ref 0.0–0.5)
Eosinophils Relative: 3 %
HCT: 35.9 % — ABNORMAL LOW (ref 36.0–46.0)
Hemoglobin: 11.8 g/dL — ABNORMAL LOW (ref 12.0–15.0)
Immature Granulocytes: 0 %
Lymphocytes Relative: 25 %
Lymphs Abs: 2.6 10*3/uL (ref 0.7–4.0)
MCH: 28.6 pg (ref 26.0–34.0)
MCHC: 32.9 g/dL (ref 30.0–36.0)
MCV: 86.9 fL (ref 80.0–100.0)
Monocytes Absolute: 0.8 10*3/uL (ref 0.1–1.0)
Monocytes Relative: 8 %
Neutro Abs: 6.7 10*3/uL (ref 1.7–7.7)
Neutrophils Relative %: 63 %
Platelets: 480 10*3/uL — ABNORMAL HIGH (ref 150–400)
RBC: 4.13 MIL/uL (ref 3.87–5.11)
RDW: 14.2 % (ref 11.5–15.5)
WBC: 10.6 10*3/uL — ABNORMAL HIGH (ref 4.0–10.5)
nRBC: 0 % (ref 0.0–0.2)

## 2024-02-23 LAB — URINALYSIS, ROUTINE W REFLEX MICROSCOPIC
Bilirubin Urine: NEGATIVE
Glucose, UA: NEGATIVE mg/dL
Hgb urine dipstick: NEGATIVE
Ketones, ur: NEGATIVE mg/dL
Leukocytes,Ua: NEGATIVE
Nitrite: NEGATIVE
Protein, ur: NEGATIVE mg/dL
Specific Gravity, Urine: 1.011 (ref 1.005–1.030)
pH: 7 (ref 5.0–8.0)

## 2024-02-23 LAB — COMPREHENSIVE METABOLIC PANEL WITH GFR
ALT: 15 U/L (ref 0–44)
AST: 16 U/L (ref 15–41)
Albumin: 3.5 g/dL (ref 3.5–5.0)
Alkaline Phosphatase: 100 U/L (ref 38–126)
Anion gap: 12 (ref 5–15)
BUN: 7 mg/dL (ref 6–20)
CO2: 25 mmol/L (ref 22–32)
Calcium: 9.4 mg/dL (ref 8.9–10.3)
Chloride: 100 mmol/L (ref 98–111)
Creatinine, Ser: 0.73 mg/dL (ref 0.44–1.00)
GFR, Estimated: >60 mL/min (ref >60)
Glucose, Bld: 117 mg/dL — ABNORMAL HIGH (ref 70–99)
Potassium: 3.7 mmol/L (ref 3.5–5.1)
Sodium: 137 mmol/L (ref 135–145)
Total Bilirubin: 0.8 mg/dL (ref 0.0–1.2)
Total Protein: 6.9 g/dL (ref 6.5–8.1)

## 2024-02-23 LAB — RESP PANEL BY RT-PCR (RSV, FLU A&B, COVID)  RVPGX2
Influenza A by PCR: NEGATIVE
Influenza B by PCR: NEGATIVE
Resp Syncytial Virus by PCR: NEGATIVE
SARS Coronavirus 2 by RT PCR: NEGATIVE

## 2024-02-23 LAB — RAPID URINE DRUG SCREEN, HOSP PERFORMED
Amphetamines: NOT DETECTED
Barbiturates: NOT DETECTED
Benzodiazepines: NOT DETECTED
Cocaine: NOT DETECTED
Opiates: POSITIVE — AB
Tetrahydrocannabinol: NOT DETECTED

## 2024-02-23 LAB — TROPONIN I (HIGH SENSITIVITY)
Troponin I (High Sensitivity): 8 ng/L (ref <18)
Troponin I (High Sensitivity): 4 ng/L (ref <18)

## 2024-02-23 LAB — URINE CULTURE: Culture: <10000 — AB

## 2024-02-23 LAB — D-DIMER, QUANTITATIVE: D-Dimer, Quant: 2.46 ug{FEU}/mL — ABNORMAL HIGH (ref 0.00–0.50)

## 2024-02-23 LAB — I-STAT CG4 LACTIC ACID, ED
Lactic Acid, Venous: 1.6 mmol/L (ref 0.5–1.9)
Lactic Acid, Venous: 1 mmol/L (ref 0.5–1.9)

## 2024-02-23 LAB — CBG MONITORING, ED: Glucose-Capillary: 115 mg/dL — ABNORMAL HIGH (ref 70–99)

## 2024-02-23 LAB — PREGNANCY, URINE: Preg Test, Ur: NEGATIVE

## 2024-02-23 MED ORDER — PROMETHAZINE (PHENERGAN) 6.25MG IN NS 50ML IVPB
6.2500 mg | Freq: Once | INTRAVENOUS | Status: DC
  Filled 2024-02-23: qty 50

## 2024-02-23 MED ORDER — ONDANSETRON HCL 4 MG/2ML IJ SOLN
4.0000 mg | Freq: Once | INTRAMUSCULAR | Status: AC
  Administered 2024-02-23: 4 mg via INTRAVENOUS
  Filled 2024-02-23: qty 2

## 2024-02-23 MED ORDER — PROMETHAZINE HCL 25 MG PO TABS: 25.0000 mg | ORAL_TABLET | Freq: Four times a day (QID) | ORAL | 0 refills | Status: DC | PRN

## 2024-02-23 MED ORDER — MORPHINE SULFATE (PF) 4 MG/ML IV SOLN
4.0000 mg | Freq: Once | INTRAVENOUS | Status: AC
  Administered 2024-02-23: 4 mg via INTRAVENOUS
  Filled 2024-02-23: qty 1

## 2024-02-23 MED ORDER — LORAZEPAM 1 MG PO TABS: 0.5000 mg | ORAL_TABLET | Freq: Once | ORAL | Status: DC

## 2024-02-23 MED ORDER — DICYCLOMINE HCL 10 MG/5ML PO SOLN
10.0000 mg | Freq: Once | ORAL | Status: AC
Start: 2024-02-23 — End: 2024-02-23
  Administered 2024-02-23: 10 mg via ORAL
  Filled 2024-02-23: qty 5

## 2024-02-23 MED ORDER — HYDROXYZINE HCL 25 MG PO TABS
25.0000 mg | ORAL_TABLET | Freq: Once | ORAL | Status: AC
  Administered 2024-02-23: 25 mg via ORAL
  Filled 2024-02-23: qty 1

## 2024-02-23 MED ORDER — IOHEXOL 350 MG/ML SOLN
75.0000 mL | Freq: Once | INTRAVENOUS | Status: AC | PRN
  Administered 2024-02-23: 75 mL via INTRAVENOUS

## 2024-02-23 MED ORDER — SODIUM CHLORIDE 0.9 % IV BOLUS
1000.0000 mL | Freq: Once | INTRAVENOUS | Status: AC
  Administered 2024-02-23: 1000 mL via INTRAVENOUS

## 2024-02-23 MED ORDER — PROMETHAZINE (PHENERGAN) 6.25MG IN NS 50ML IVPB
6.2500 mg | Freq: Once | INTRAVENOUS | Status: AC
  Administered 2024-02-23: 6.25 mg via INTRAVENOUS
  Filled 2024-02-23: qty 6.25

## 2024-02-23 NOTE — ED Triage Notes (Signed)
 Pt bib [facility]. Pt had right knee surgery on Tuesday. Pt had an episode of severe pain, nausea and weakness. Pt seen here last night and given rx but pt not feeling any better.

## 2024-02-23 NOTE — Discharge Instructions (Addendum)
 Take Zofran  or Phenergan  for your nausea.  Continue to hydrate.  Follow-up with your surgeon.  Return if symptoms worsen.

## 2024-02-23 NOTE — ED Provider Notes (Signed)
 [place] [facility] AT [facility] Provider Note   CSN: [id number] Arrival date & time: 02/23/24  7829     Patient presents with: Weakness, Shaking, and Abdominal Pain  Hailey [name] is a 48 y.o. female with history of hypothyroidism, prediabetes, asthma presents with persistent nausea, weakness and abdominal pain.  Patient is status post right TKA on 6/10 with Dr. Bernard Brick.  Patient reports that over the past 24 hours she has been shaking constantly.  She states  she feels like she is going to die.  She denies any chest pain, shortness of breath.  Has not had any vomiting.  No diarrhea.  No dysuria, does endorse increased frequency.  Was evaluated here for similar complaints.  She denies any right knee pain.  States it feels great after the surgery.  Her husband states she needs to be admitted for pain control.    Weakness Associated symptoms: abdominal pain   Abdominal Pain  Past Medical History:  Diagnosis Date   Allergic state    Allergy    Ankylosing spondylitis (HCC)    followed by rheumatology Dr. Lydia Sams   Arthritis    Asthma    Cervical disc disease    Chest pain    Depression    Enlarged lymph nodes    GERD (gastroesophageal reflux disease)    H/O colonoscopy    HPV (human papilloma virus) infection    Hypothyroidism    Interstitial cystitis    LGSIL (low grade squamous intraepithelial dysplasia)    Paresthesia    Pre-diabetes    Status post laparoscopic cholecystectomy 07/13/2016   Past Surgical History:  Procedure Laterality Date   BREAST BIOPSY Right 10/18/2015   bx/clip- neg   CHOLECYSTECTOMY N/A 07/07/2016   Procedure: LAPAROSCOPIC CHOLECYSTECTOMY;  Surgeon: Emmalene Hare, MD;  Location: [facility] ORS;  Service: General;  Laterality: N/A;   COLONOSCOPY WITH PROPOFOL  N/A 10/11/2015   Procedure: COLONOSCOPY WITH PROPOFOL ;  Surgeon: Deveron Fly, MD;  Location: [facility] ENDOSCOPY;  Service: Endoscopy;  Laterality: N/A;    ESOPHAGOGASTRODUODENOSCOPY (EGD) WITH PROPOFOL  N/A 10/11/2015   Procedure: ESOPHAGOGASTRODUODENOSCOPY (EGD) WITH PROPOFOL ;  Surgeon: Deveron Fly, MD;  Location: [facility] ENDOSCOPY;  Service: Endoscopy;  Laterality: N/A;   TONSILLECTOMY     TOTAL KNEE ARTHROPLASTY Left 01/08/2024   Procedure: LEFT TOTAL KNEE ARTHROPLASTYL;  Surgeon: Claiborne Crew, MD;  Location: [facility] ORS;  Service: Orthopedics;  Laterality: Left;   TOTAL KNEE ARTHROPLASTY Right 02/19/2024   Procedure: ARTHROPLASTY, KNEE, TOTAL;  Surgeon: Claiborne Crew, MD;  Location: [facility] ORS;  Service: Orthopedics;  Laterality: Right;   TOTAL THYROIDECTOMY  2005   goiter       Prior to Admission medications   Medication Sig Start Date End Date Taking? Authorizing Provider  albuterol  (VENTOLIN  HFA) 108 (90 Base) MCG/ACT inhaler Inhale 2 puffs into the lungs every 6 (six) hours as needed for wheezing or shortness of breath. 10/12/23   Sheron Dixons, MD  aspirin  81 MG chewable tablet Chew 1 tablet (81 mg total) by mouth 2 (two) times daily. 02/18/24     celecoxib  (CELEBREX ) 200 MG capsule Take 1 capsule (200 mg total) by mouth 2 (two) times daily with a meal. 12/24/23     fluticasone -salmeterol (ADVAIR  DISKUS) 100-50 MCG/ACT AEPB Inhale 1 puff into the lungs 2 (two) times daily as needed. Patient taking differently: Inhale 1 puff into the lungs daily as needed (SOB). 10/12/23   Sheron Dixons, MD  hydrOXYzine  (ATARAX ) 25 MG tablet Take  1 tablet by mouth at bedtime as needed for sleep 02/07/24     methocarbamol  (ROBAXIN ) 500 MG tablet Take 1 tablet (500 mg total) by mouth every 6 (six) hours as needed. 12/24/23     metoCLOPramide  (REGLAN ) 10 MG tablet Take 1 tablet (10 mg total) by mouth every 6 (six) hours as needed for nausea. 02/22/24   Spence Dux, PA-C  Multiple Vitamin (MULTIVITAMIN WITH MINERALS) TABS tablet Take 1 tablet by mouth daily.    [provider]  nitrofurantoin , macrocrystal-monohydrate, (MACROBID ) 100 MG capsule Take 1  capsule (100 mg total) by mouth 2 (two) times daily for 5 days. 02/20/24 02/25/24  Earnie Gola, PA-C  nystatin  cream (MYCOSTATIN ) Apply 1 Application topically 2 (two) times daily as needed (rash).    [provider]  ondansetron  (ZOFRAN -ODT) 4 MG disintegrating tablet Take 1 tablet by mouth every 6 hours as needed for nausea/vomiting 02/18/24     oxyCODONE  (OXY IR/ROXICODONE ) 5 MG immediate release tablet Take 1 tablet (5 mg total) by mouth every 4 (four) hours as needed for severe pain. 02/18/24     polyethylene glycol powder (GLYCOLAX /MIRALAX ) 17 GM/SCOOP powder Mix 1 capful (17 grams) in liquid and drink by mouth 2 (two) times daily. 02/18/24     senna (SENOKOT) 8.6 MG tablet Take 2 tablets (17.2 mg total) by mouth at bedtime for 14 days. 02/18/24 03/03/24    SYNTHROID  200 MCG tablet Take 1 tablet (200 mcg total) by mouth once daily. Take on an empty stomach with a glass of water  at least 30-60 minutes before breakfast. 06/18/23     tiZANidine  (ZANAFLEX ) 4 MG tablet Take 1 tablet (4 mg total) by mouth at bedtime as needed for muscle spasm or pain 02/13/24       Allergies: Levofloxacin, Lodine [etodolac], Adalimumab , Ciprofloxacin, and Latex    Review of Systems  Gastrointestinal:  Positive for abdominal pain.  Neurological:  Positive for weakness.    Updated Vital Signs BP 132/62 (BP Location: Left Arm)   Pulse 67   Temp 98.2 F (36.8 C) (Oral)   Resp 20   LMP 11/27/2023   SpO2 100%   Physical Exam Vitals and nursing note reviewed.  Constitutional:      General: She is not in acute distress.    Appearance: She is well-developed.  HENT:     Head: Normocephalic and atraumatic.   Eyes:     Conjunctiva/sclera: Conjunctivae normal.    Cardiovascular:     Rate and Rhythm: Normal rate and regular rhythm.     Heart sounds: No murmur heard. Pulmonary:     Effort: Pulmonary effort is normal. No respiratory distress.     Breath sounds: Normal breath sounds.  Abdominal:      Palpations: Abdomen is soft.     Tenderness: There is abdominal tenderness.     Comments: Mild epigastric and right lower quadrant tenderness, soft nondistended   Musculoskeletal:     Cervical back: Neck supple.     Comments: Right knee incision without any evidence of dehiscence or drainage there is no surrounding erythema, mild increased warmth and swelling   Skin:    General: Skin is warm and dry.     Capillary Refill: Capillary refill takes less than 2 seconds.   Neurological:     Mental Status: She is alert.   Psychiatric:        Mood and Affect: Mood normal.     (all labs ordered are listed, but only abnormal results  are displayed) Labs Reviewed  CBC WITH DIFFERENTIAL/PLATELET - Abnormal; Notable for the following components:      Result Value   WBC 10.6 (*)    Hemoglobin 11.8 (*)    HCT 35.9 (*)    Platelets 480 (*)    All other components within normal limits  COMPREHENSIVE METABOLIC PANEL WITH GFR - Abnormal; Notable for the following components:   Glucose, Bld 117 (*)    All other components within normal limits  URINALYSIS, ROUTINE W REFLEX MICROSCOPIC - Abnormal; Notable for the following components:   Color, Urine STRAW (*)    All other components within normal limits  D-DIMER, QUANTITATIVE - Abnormal; Notable for the following components:   D-Dimer, Quant 2.46 (*)    All other components within normal limits  RAPID URINE DRUG SCREEN, HOSP PERFORMED - Abnormal; Notable for the following components:   Opiates POSITIVE (*)    All other components within normal limits  CBG MONITORING, ED - Abnormal; Notable for the following components:   Glucose-Capillary 115 (*)    All other components within normal limits  CULTURE, BLOOD (ROUTINE X 2)  RESP PANEL BY RT-PCR (RSV, FLU A&B, COVID)  RVPGX2  CULTURE, BLOOD (ROUTINE X 2)  PREGNANCY, URINE  TSH  HCG, QUANTITATIVE, PREGNANCY  I-STAT CG4 LACTIC ACID, ED  I-STAT CG4 LACTIC ACID, ED  TROPONIN I (HIGH SENSITIVITY)   TROPONIN I (HIGH SENSITIVITY)    EKG: EKG Interpretation Date/Time:  Saturday February 23 2024 09:56:10 EDT Ventricular Rate:  71 PR Interval:  150 QRS Duration:  92 QT Interval:  396 QTC Calculation: 430 R Axis:   9  Text Interpretation: Normal sinus rhythm Normal ECG When compared with ECG of 22-Feb-2024 15:13, No significant change since last tracing Confirmed by Scarlette Currier ([id number]) on 02/23/2024 12:11:32 PM  Radiology: [facility] Query Chest 2 View Result Date: 02/23/2024 CLINICAL DATA:  Upper respiratory infection.  Suspected pneumonia. EXAM: CHEST - 2 VIEW COMPARISON:  11/27/2023 FINDINGS: The heart size and mediastinal contours are within normal limits. Both lungs are clear. The visualized skeletal structures are unremarkable. IMPRESSION: No active cardiopulmonary disease. Electronically Signed   By: Marlyce Sine M.D.   On: 02/23/2024 09:25   US  Pelvis Complete Result Date: 02/22/2024 CLINICAL DATA:  Follow-up right ovarian cyst EXAM: TRANSABDOMINAL AND TRANSVAGINAL ULTRASOUND OF PELVIS DOPPLER ULTRASOUND OF OVARIES TECHNIQUE: Both transabdominal and transvaginal ultrasound examinations of the pelvis were performed. Transabdominal technique was performed for global imaging of the pelvis including uterus, ovaries, adnexal regions, and pelvic cul-de-sac. It was necessary to proceed with endovaginal exam following the transabdominal exam to visualize the ovaries. Color and duplex Doppler ultrasound was utilized to evaluate blood flow to the ovaries. COMPARISON:  CT from earlier in the same day. FINDINGS: Uterus Measurements: 7.3 x 3.6 x 4.5 cm. = volume: 61 mL. 1.6 cm fibroid is noted anteriorly on the left. Nabothian cysts are noted within the cervix. Endometrium Thickness: 4 mm.  No focal abnormality visualized. Right ovary Measurements: 5.0 x 4.4 x 4.6 cm. = volume: 52 mL. 4.6 cm cyst is noted similar to that seen on the prior CT examination. No evidence of ovarian torsion is noted. Left ovary Not  well visualized Pulsed Doppler evaluation of the right ovary demonstrates normal low-resistance arterial and venous waveforms. Other findings No abnormal free fluid. IMPRESSION: 4.6 cm right ovarian cyst. No evidence of torsion is noted. No follow-up is recommended. Small left uterine fibroid. Electronically Signed   By: Regenia Cape.D.  On: 02/22/2024 22:26   US  Transvaginal Non-OB Result Date: 02/22/2024 CLINICAL DATA:  Follow-up right ovarian cyst EXAM: TRANSABDOMINAL AND TRANSVAGINAL ULTRASOUND OF PELVIS DOPPLER ULTRASOUND OF OVARIES TECHNIQUE: Both transabdominal and transvaginal ultrasound examinations of the pelvis were performed. Transabdominal technique was performed for global imaging of the pelvis including uterus, ovaries, adnexal regions, and pelvic cul-de-sac. It was necessary to proceed with endovaginal exam following the transabdominal exam to visualize the ovaries. Color and duplex Doppler ultrasound was utilized to evaluate blood flow to the ovaries. COMPARISON:  CT from earlier in the same day. FINDINGS: Uterus Measurements: 7.3 x 3.6 x 4.5 cm. = volume: 61 mL. 1.6 cm fibroid is noted anteriorly on the left. Nabothian cysts are noted within the cervix. Endometrium Thickness: 4 mm.  No focal abnormality visualized. Right ovary Measurements: 5.0 x 4.4 x 4.6 cm. = volume: 52 mL. 4.6 cm cyst is noted similar to that seen on the prior CT examination. No evidence of ovarian torsion is noted. Left ovary Not well visualized Pulsed Doppler evaluation of the right ovary demonstrates normal low-resistance arterial and venous waveforms. Other findings No abnormal free fluid. IMPRESSION: 4.6 cm right ovarian cyst. No evidence of torsion is noted. No follow-up is recommended. Small left uterine fibroid. Electronically Signed   By: Violeta Grey M.D.   On: 02/22/2024 22:26   US  Art/Ven Flow Abd Pelv Doppler Result Date: 02/22/2024 CLINICAL DATA:  Follow-up right ovarian cyst EXAM: TRANSABDOMINAL AND  TRANSVAGINAL ULTRASOUND OF PELVIS DOPPLER ULTRASOUND OF OVARIES TECHNIQUE: Both transabdominal and transvaginal ultrasound examinations of the pelvis were performed. Transabdominal technique was performed for global imaging of the pelvis including uterus, ovaries, adnexal regions, and pelvic cul-de-sac. It was necessary to proceed with endovaginal exam following the transabdominal exam to visualize the ovaries. Color and duplex Doppler ultrasound was utilized to evaluate blood flow to the ovaries. COMPARISON:  CT from earlier in the same day. FINDINGS: Uterus Measurements: 7.3 x 3.6 x 4.5 cm. = volume: 61 mL. 1.6 cm fibroid is noted anteriorly on the left. Nabothian cysts are noted within the cervix. Endometrium Thickness: 4 mm.  No focal abnormality visualized. Right ovary Measurements: 5.0 x 4.4 x 4.6 cm. = volume: 52 mL. 4.6 cm cyst is noted similar to that seen on the prior CT examination. No evidence of ovarian torsion is noted. Left ovary Not well visualized Pulsed Doppler evaluation of the right ovary demonstrates normal low-resistance arterial and venous waveforms. Other findings No abnormal free fluid. IMPRESSION: 4.6 cm right ovarian cyst. No evidence of torsion is noted. No follow-up is recommended. Small left uterine fibroid. Electronically Signed   By: Violeta Grey M.D.   On: 02/22/2024 22:26   CT ABDOMEN PELVIS W CONTRAST Result Date: 02/22/2024 EXAM: CT ABDOMEN AND PELVIS WITH CONTRAST 02/22/2024 08:44:09 PM TECHNIQUE: CT of the abdomen and pelvis was performed with the administration of intravenous contrast, 100mL of iohexol  (OMNIPAQUE ) 300 MG/ML solution. Multiplanar reformatted images are provided for review. Automated exposure control, iterative reconstruction, and/or weight-based adjustment of the mA/kV was utilized to reduce the radiation dose to as low as reasonably achievable. COMPARISON: Comparison to CT from 07/07/2022. CLINICAL HISTORY: Bowel obstruction suspected; RLQ abdominal pain.  Patient came in from home, complaining of increased weakness, nausea, and near syncopal episodes since yesterday. Had total knee surgery Tuesday. Left messages with office with no return phone call. FINDINGS: LOWER CHEST: No acute abnormality. LIVER: The liver is unremarkable. GALLBLADDER AND BILE DUCTS: Status post cholecystectomy. No biliary ductal dilatation. SPLEEN:  No acute abnormality. PANCREAS: No acute abnormality. ADRENAL GLANDS: No acute abnormality. KIDNEYS, URETERS AND BLADDER: No stones in the kidneys or ureters. No hydronephrosis. No perinephric or periureteral stranding. Urinary bladder is unremarkable. GI AND BOWEL: Appendix is normal, as seen on image 71. Stomach demonstrates no acute abnormality. There is no bowel obstruction. No bowel wall thickening. REPRODUCTIVE ORGANS: A 5.6 cm right ovarian cystic lesion is present, previously 3.8 cm. Uterus and left ovary are unremarkable. PERITONEUM AND RETROPERITONEUM: No ascites. No free air. VASCULATURE: Aorta is normal in caliber. LYMPH NODES: No lymphadenopathy. BONES AND SOFT TISSUES: No acute osseous abnormality. No focal soft tissue abnormality. IMPRESSION: 1. Normal appendix. 2. 5.6 cm right ovarian cystic lesion, increased. Consider pelvic ultrasound with doppler for further evaluation in this clinical setting. Electronically signed by: Zadie Herter MD 02/22/2024 08:54 PM EDT RP Workstation: [id number]     Procedures   Medications Ordered in the ED  LORazepam (ATIVAN) tablet 0.5 mg (0.5 mg Oral Patient Refused/Not Given 02/23/24 1321)  ondansetron  (ZOFRAN ) injection 4 mg (4 mg Intravenous Given 02/23/24 0947)  morphine (PF) 4 MG/ML injection 4 mg (4 mg Intravenous Given 02/23/24 0948)  dicyclomine (BENTYL) 10 MG/5ML solution 10 mg (10 mg Oral Given 02/23/24 0946)  sodium chloride  0.9 % bolus 1,000 mL (1,000 mLs Intravenous New Bag/Given 02/23/24 1307)  hydrOXYzine  (ATARAX ) tablet 25 mg (25 mg Oral Given 02/23/24 1331)                                     Medical Decision Making Amount and/or Complexity of Data Reviewed Labs: ordered. Radiology: ordered.  Risk Prescription drug management.   This patient presents to the ED with chief complaint(s) of shaking.  The complaint involves an extensive differential diagnosis and also carries with it a high risk of complications and morbidity.   Pertinent past medical history as listed in HPI  The differential diagnosis includes  Bacteremia, sepsis, septic joint, URI, ACS, pneumonia, DVT/PE Additional history obtained: Records reviewed Care Everywhere/External Records  Assessment and management:   Hemodynamically stable, afebrile patient presenting with multiple complaints including persistent shaking and chills, abdominal pain and nausea over the past few days. Patient was seen here yesterday. CT abdomen pelvis and pelvic ultrasound demonstrated 4 to 5 cm right-sided ovarian cyst.  Her lab work at that time demonstrated no acute abnormality.  She does endorse a very mild cough.  Lungs are clear however.  She does have mild epigastric tenderness as well as to right lower quadrant.  Her right knee incision is without any evidence of dehiscence or drainage.  There is no surrounding erythema, mild surrounding warmth and swelling.  Denies any knee pain.  She ranges her knee without discomfort.  No calf tenderness.  Do not suspect DVT.  Lower suspicion for septic joint.   Independent ECG interpretation:  Normal sinus rhythm  Independent labs interpretation:  The following labs were independently interpreted:  Lactic without elevation, CMP without significant abnormality, CBC with mild leukocytosis of 10.6, troponin without elevation, UA without nitrates, leukocytes, dimer 2.46  Independent visualization and interpretation of imaging: I independently visualized the following imaging with scope of interpretation limited to determining acute life threatening conditions related to  emergency care:  Chest x-ray with no active cardiopulmonary disease CT chest PE pending CT abdomen pelvis pending  Consultations obtained:   none  Disposition:   Signout given to [employer] DO.  Please see her note for remainder of the visit.  Disposition pending workup.  Social Determinants of Health:   none  This note was dictated with voice recognition software.  Despite best efforts at proofreading, errors may have occurred which can change the documentation meaning.       Final diagnoses:  Weakness    ED Discharge Orders     None          Stanton Earthly 02/23/24 1534    Scarlette Currier, MD 02/25/24 [phone]

## 2024-02-23 NOTE — ED Provider Notes (Signed)
 CT scans are unremarkable.  No evidence of PE or other acute process in the chest.  No acute process in the abdomen pelvis.  Overall she is feeling better with medicine here.  Give her a dose of Phenergan  she is able to tolerate p.o. better.  Blood cultures have been collected.  Seems less likely that she has some sort of occult bacteremia.  At this time I think she is just going through some postoperative discomfort.  Maybe still feeling some effects of pain medicine and anesthesia.  Will provide her prescription for Phenergan  to help as well.  My hope is that she will continue to be able to eat and have some nutrition which has been a big problem with her here with Phenergan  and Zofran .  Recommend close follow-up with primary care doctor and orthopedics.  Told to return if symptoms worsen.  Discharge.  This chart was dictated using voice recognition software.  Despite best efforts to proofread,  errors can occur which can change the documentation meaning.    Hailey Rue, DO 02/23/24 1656

## 2024-02-25 ENCOUNTER — Ambulatory Visit: Admitting: Cardiology

## 2024-02-25 ENCOUNTER — Other Ambulatory Visit: Payer: Self-pay

## 2024-02-25 NOTE — Discharge Summary (Signed)
 Patient ID: Hailey [name] MRN: [id number] DOB/AGE: 48/27/1977 48 y.o.  Admit date: 02/19/2024 Discharge date: 02/20/2024  Admission Diagnoses:  Right knee osteoarthritis  Discharge Diagnoses:  Principal Problem:   S/P total knee arthroplasty, right   Past Medical History:  Diagnosis Date   Allergic state    Allergy    Ankylosing spondylitis (HCC)    followed by rheumatology Dr. Lydia Sams   Arthritis    Asthma    Cervical disc disease    Chest pain    Depression    Enlarged lymph nodes    GERD (gastroesophageal reflux disease)    H/O colonoscopy    HPV (human papilloma virus) infection    Hypothyroidism    Interstitial cystitis    LGSIL (low grade squamous intraepithelial dysplasia)    Paresthesia    Pre-diabetes    Status post laparoscopic cholecystectomy 07/13/2016    Surgeries: Procedure(s): ARTHROPLASTY, KNEE, TOTAL on 02/19/2024   Consultants:   Discharged Condition: Improved  Hospital Course: Hailey [name] is an 48 y.o. female who was admitted 02/19/2024 for operative treatment ofS/P total knee arthroplasty, right. Patient has severe unremitting pain that affects sleep, daily activities, and work/hobbies. After pre-op clearance the patient was taken to the operating room on 02/19/2024 and underwent  Procedure(s): ARTHROPLASTY, KNEE, TOTAL.    Patient was given perioperative antibiotics:  Anti-infectives (From admission, onward)    Start     Dose/Rate Route Frequency Ordered Stop   02/20/24 0000  nitrofurantoin , macrocrystal-monohydrate, (MACROBID ) 100 MG capsule        100 mg Oral 2 times daily 02/20/24 0831 02/25/24 2359   02/19/24 1145  ceFAZolin  (ANCEF ) IVPB 2g/100 mL premix        2 g 200 mL/hr over 30 Minutes Intravenous Every 6 hours 02/19/24 1048 02/19/24 1938   02/19/24 0600  ceFAZolin  (ANCEF ) IVPB 2g/100 mL premix        2 g 200 mL/hr over 30 Minutes Intravenous On call to O.R. 02/19/24 0531 02/19/24 0725        Patient was given sequential  compression devices, early ambulation, and chemoprophylaxis to prevent DVT. Patient worked with PT and was meeting their goals regarding safe ambulation and transfers.  Patient benefited maximally from hospital stay and there were no complications.    Recent vital signs: No data found.   Recent laboratory studies:  Recent Labs    02/22/24 1637 02/23/24 0824  WBC 11.7* 10.6*  HGB 11.8* 11.8*  HCT 36.6 35.9*  PLT 459* 480*  NA 137 137  K 3.8 3.7  CL 102 100  CO2 24 25  BUN 13 7  CREATININE 0.78 0.73  GLUCOSE 115* 117*  CALCIUM 9.1 9.4     Discharge Medications:   Allergies as of 02/20/2024       Reactions   Levofloxacin Rash, Dermatitis   Lodine [etodolac] Shortness Of Breath   Adalimumab     Unknown reaction    Ciprofloxacin Hives   Latex Rash        Medication List     STOP taking these medications    cyclobenzaprine  5 MG tablet Commonly known as: FLEXERIL        TAKE these medications    albuterol  108 (90 Base) MCG/ACT inhaler Commonly known as: VENTOLIN  HFA Inhale 2 puffs into the lungs every 6 (six) hours as needed for wheezing or shortness of breath.   aspirin  81 MG chewable tablet Chew 1 tablet (81 mg total) by mouth 2 (two) times daily.  celecoxib  200 MG capsule Commonly known as: CELEBREX  Take 1 capsule (200 mg total) by mouth 2 (two) times daily with a meal. What changed: Another medication with the same name was removed. Continue taking this medication, and follow the directions you see here.   fluticasone -salmeterol 100-50 MCG/ACT Aepb Commonly known as: Advair  Diskus Inhale 1 puff into the lungs 2 (two) times daily as needed.   hydrOXYzine  25 MG tablet Commonly known as: ATARAX  Take 1 tablet by mouth at bedtime as needed for sleep   methocarbamol  500 MG tablet Commonly known as: ROBAXIN  Take 1 tablet (500 mg total) by mouth every 6 (six) hours as needed.   multivitamin with minerals Tabs tablet Take 1 tablet by mouth daily.    nitrofurantoin  (macrocrystal-monohydrate) 100 MG capsule Commonly known as: Macrobid  Take 1 capsule (100 mg total) by mouth 2 (two) times daily for 5 days.   nystatin  cream Commonly known as: MYCOSTATIN  Apply 1 Application topically 2 (two) times daily as needed (rash).   ondansetron  4 MG disintegrating tablet Commonly known as: ZOFRAN -ODT Take 1 tablet by mouth every 6 hours as needed for nausea/vomiting   oxyCODONE  5 MG immediate release tablet Commonly known as: Oxy IR/ROXICODONE  Take 1 tablet (5 mg total) by mouth every 4 (four) hours as needed for severe pain.   polyethylene glycol powder 17 GM/SCOOP powder Commonly known as: GLYCOLAX /MIRALAX  Mix 1 capful (17 grams) in liquid and drink by mouth 2 (two) times daily.   senna 8.6 MG tablet Commonly known as: SENOKOT Take 2 tablets (17.2 mg total) by mouth at bedtime for 14 days.   Synthroid  200 MCG tablet Generic drug: levothyroxine  Take 1 tablet (200 mcg total) by mouth once daily. Take on an empty stomach with a glass of water  at least 30-60 minutes before breakfast.   tiZANidine  4 MG tablet Commonly known as: ZANAFLEX  Take 1 tablet (4 mg total) by mouth at bedtime as needed for muscle spasm or pain       ASK your doctor about these medications    tranexamic acid  650 MG Tabs tablet Commonly known as: LYSTEDA  Take 3 tablets (1,950 mg total) by mouth daily for 3 days. Ask about: Should I take this medication?               Discharge Care Instructions  (From admission, onward)           Start     Ordered   02/20/24 0000  Change dressing       Comments: Maintain surgical dressing until follow up in the clinic. If the edges start to pull up, may reinforce with tape. If the dressing is no longer working, may remove and cover with gauze and tape, but must keep the area dry and clean.  Call with any questions or concerns.   02/20/24 0803            Diagnostic Studies: CT Angio Chest PE W and/or Wo  Contrast Result Date: 02/23/2024 CLINICAL DATA:  Pulmonary embolism suspected, high probability. Abdominal pain, weakness, and shaking. EXAM: CT ANGIOGRAPHY CHEST CT ABDOMEN AND PELVIS WITH CONTRAST TECHNIQUE: Multidetector CT imaging of the chest was performed using the standard protocol during bolus administration of intravenous contrast. Multiplanar CT image reconstructions and MIPs were obtained to evaluate the vascular anatomy. Multidetector CT imaging of the abdomen and pelvis was performed using the standard protocol during bolus administration of intravenous contrast. RADIATION DOSE REDUCTION: This exam was performed according to the departmental dose-optimization program which includes  automated exposure control, adjustment of the mA and/or kV according to patient size and/or use of iterative reconstruction technique. CONTRAST:  75mL OMNIPAQUE  IOHEXOL  350 MG/ML SOLN COMPARISON:  11/29/2023, 02/22/2024. FINDINGS: CTA CHEST FINDINGS Cardiovascular: The heart is normal in size and there is a trace pericardial effusion. The aorta at and pulmonary trunk are normal in caliber. Evaluation of segmental and subsegmental pulmonary arteries is limited due to suboptimal opacification. No large central pulmonary embolus is seen. Mediastinum/Nodes: No mediastinal, hilar, or axillary lymphadenopathy is seen. Surgical clips are present in the thyroid  bed. The trachea and esophagus are within normal limits. Lungs/Pleura: No consolidation, effusion, or pneumothorax is seen. A stable pulmonary cyst is present in the left lower lobe. Musculoskeletal: No acute osseous abnormality. Review of the MIP images confirms the above findings. CT ABDOMEN and PELVIS FINDINGS Hepatobiliary: No focal liver abnormality is seen. Status post cholecystectomy. No biliary dilatation. Pancreas: Unremarkable. No pancreatic ductal dilatation or surrounding inflammatory changes. Spleen: Normal in size without focal abnormality. Adrenals/Urinary  Tract: The adrenal glands are within normal limits. The kidneys enhance symmetrically. No renal or ureteral calculus or obstructive uropathy bilaterally. The bladder is unremarkable. Stomach/Bowel: Stomach is within normal limits. Appendix appears normal. No evidence of bowel wall thickening, distention, or inflammatory changes. No free air or pneumatosis. Vascular/Lymphatic: No abdominal or pelvic lymphadenopathy by size criteria. Reproductive: The uterus is within normal limits. There is a hypodense structure in the right ovary measuring 5.6 cm, unchanged from the prior exam and previously characterized as cyst. No adnexal mass on the left. Other: No abdominopelvic ascites. A fat containing umbilical hernia is present. Musculoskeletal: No acute osseous abnormality. Sclerosis is noted at the sacroiliac joints bilaterally, greater on the left than on the right, compatible with sacroiliitis. Review of the MIP images confirms the above findings. IMPRESSION: 1. No large central pulmonary embolus or other acute process in the chest. 2. No acute process in the abdomen and pelvis. 3. Stable right ovarian cyst. Electronically Signed   By: Wyvonnia Heimlich M.D.   On: 02/23/2024 15:59   CT ABDOMEN PELVIS W CONTRAST Result Date: 02/23/2024 CLINICAL DATA:  Pulmonary embolism suspected, high probability. Abdominal pain, weakness, and shaking. EXAM: CT ANGIOGRAPHY CHEST CT ABDOMEN AND PELVIS WITH CONTRAST TECHNIQUE: Multidetector CT imaging of the chest was performed using the standard protocol during bolus administration of intravenous contrast. Multiplanar CT image reconstructions and MIPs were obtained to evaluate the vascular anatomy. Multidetector CT imaging of the abdomen and pelvis was performed using the standard protocol during bolus administration of intravenous contrast. RADIATION DOSE REDUCTION: This exam was performed according to the departmental dose-optimization program which includes automated exposure control,  adjustment of the mA and/or kV according to patient size and/or use of iterative reconstruction technique. CONTRAST:  75mL OMNIPAQUE  IOHEXOL  350 MG/ML SOLN COMPARISON:  11/29/2023, 02/22/2024. FINDINGS: CTA CHEST FINDINGS Cardiovascular: The heart is normal in size and there is a trace pericardial effusion. The aorta at and pulmonary trunk are normal in caliber. Evaluation of segmental and subsegmental pulmonary arteries is limited due to suboptimal opacification. No large central pulmonary embolus is seen. Mediastinum/Nodes: No mediastinal, hilar, or axillary lymphadenopathy is seen. Surgical clips are present in the thyroid  bed. The trachea and esophagus are within normal limits. Lungs/Pleura: No consolidation, effusion, or pneumothorax is seen. A stable pulmonary cyst is present in the left lower lobe. Musculoskeletal: No acute osseous abnormality. Review of the MIP images confirms the above findings. CT ABDOMEN and PELVIS FINDINGS Hepatobiliary: No focal liver  abnormality is seen. Status post cholecystectomy. No biliary dilatation. Pancreas: Unremarkable. No pancreatic ductal dilatation or surrounding inflammatory changes. Spleen: Normal in size without focal abnormality. Adrenals/Urinary Tract: The adrenal glands are within normal limits. The kidneys enhance symmetrically. No renal or ureteral calculus or obstructive uropathy bilaterally. The bladder is unremarkable. Stomach/Bowel: Stomach is within normal limits. Appendix appears normal. No evidence of bowel wall thickening, distention, or inflammatory changes. No free air or pneumatosis. Vascular/Lymphatic: No abdominal or pelvic lymphadenopathy by size criteria. Reproductive: The uterus is within normal limits. There is a hypodense structure in the right ovary measuring 5.6 cm, unchanged from the prior exam and previously characterized as cyst. No adnexal mass on the left. Other: No abdominopelvic ascites. A fat containing umbilical hernia is present.  Musculoskeletal: No acute osseous abnormality. Sclerosis is noted at the sacroiliac joints bilaterally, greater on the left than on the right, compatible with sacroiliitis. Review of the MIP images confirms the above findings. IMPRESSION: 1. No large central pulmonary embolus or other acute process in the chest. 2. No acute process in the abdomen and pelvis. 3. Stable right ovarian cyst. Electronically Signed   By: Wyvonnia Heimlich M.D.   On: 02/23/2024 15:59   DG Chest 2 View Result Date: 02/23/2024 CLINICAL DATA:  Upper respiratory infection.  Suspected pneumonia. EXAM: CHEST - 2 VIEW COMPARISON:  11/27/2023 FINDINGS: The heart size and mediastinal contours are within normal limits. Both lungs are clear. The visualized skeletal structures are unremarkable. IMPRESSION: No active cardiopulmonary disease. Electronically Signed   By: Marlyce Sine M.D.   On: 02/23/2024 09:25   US  Pelvis Complete Result Date: 02/22/2024 CLINICAL DATA:  Follow-up right ovarian cyst EXAM: TRANSABDOMINAL AND TRANSVAGINAL ULTRASOUND OF PELVIS DOPPLER ULTRASOUND OF OVARIES TECHNIQUE: Both transabdominal and transvaginal ultrasound examinations of the pelvis were performed. Transabdominal technique was performed for global imaging of the pelvis including uterus, ovaries, adnexal regions, and pelvic cul-de-sac. It was necessary to proceed with endovaginal exam following the transabdominal exam to visualize the ovaries. Color and duplex Doppler ultrasound was utilized to evaluate blood flow to the ovaries. COMPARISON:  CT from earlier in the same day. FINDINGS: Uterus Measurements: 7.3 x 3.6 x 4.5 cm. = volume: 61 mL. 1.6 cm fibroid is noted anteriorly on the left. Nabothian cysts are noted within the cervix. Endometrium Thickness: 4 mm.  No focal abnormality visualized. Right ovary Measurements: 5.0 x 4.4 x 4.6 cm. = volume: 52 mL. 4.6 cm cyst is noted similar to that seen on the prior CT examination. No evidence of ovarian torsion is  noted. Left ovary Not well visualized Pulsed Doppler evaluation of the right ovary demonstrates normal low-resistance arterial and venous waveforms. Other findings No abnormal free fluid. IMPRESSION: 4.6 cm right ovarian cyst. No evidence of torsion is noted. No follow-up is recommended. Small left uterine fibroid. Electronically Signed   By: Violeta Grey M.D.   On: 02/22/2024 22:26   US  Transvaginal Non-OB Result Date: 02/22/2024 CLINICAL DATA:  Follow-up right ovarian cyst EXAM: TRANSABDOMINAL AND TRANSVAGINAL ULTRASOUND OF PELVIS DOPPLER ULTRASOUND OF OVARIES TECHNIQUE: Both transabdominal and transvaginal ultrasound examinations of the pelvis were performed. Transabdominal technique was performed for global imaging of the pelvis including uterus, ovaries, adnexal regions, and pelvic cul-de-sac. It was necessary to proceed with endovaginal exam following the transabdominal exam to visualize the ovaries. Color and duplex Doppler ultrasound was utilized to evaluate blood flow to the ovaries. COMPARISON:  CT from earlier in the same day. FINDINGS: Uterus Measurements: 7.3 x  3.6 x 4.5 cm. = volume: 61 mL. 1.6 cm fibroid is noted anteriorly on the left. Nabothian cysts are noted within the cervix. Endometrium Thickness: 4 mm.  No focal abnormality visualized. Right ovary Measurements: 5.0 x 4.4 x 4.6 cm. = volume: 52 mL. 4.6 cm cyst is noted similar to that seen on the prior CT examination. No evidence of ovarian torsion is noted. Left ovary Not well visualized Pulsed Doppler evaluation of the right ovary demonstrates normal low-resistance arterial and venous waveforms. Other findings No abnormal free fluid. IMPRESSION: 4.6 cm right ovarian cyst. No evidence of torsion is noted. No follow-up is recommended. Small left uterine fibroid. Electronically Signed   By: Violeta Grey M.D.   On: 02/22/2024 22:26   US  Art/Ven Flow Abd Pelv Doppler Result Date: 02/22/2024 CLINICAL DATA:  Follow-up right ovarian cyst EXAM:  TRANSABDOMINAL AND TRANSVAGINAL ULTRASOUND OF PELVIS DOPPLER ULTRASOUND OF OVARIES TECHNIQUE: Both transabdominal and transvaginal ultrasound examinations of the pelvis were performed. Transabdominal technique was performed for global imaging of the pelvis including uterus, ovaries, adnexal regions, and pelvic cul-de-sac. It was necessary to proceed with endovaginal exam following the transabdominal exam to visualize the ovaries. Color and duplex Doppler ultrasound was utilized to evaluate blood flow to the ovaries. COMPARISON:  CT from earlier in the same day. FINDINGS: Uterus Measurements: 7.3 x 3.6 x 4.5 cm. = volume: 61 mL. 1.6 cm fibroid is noted anteriorly on the left. Nabothian cysts are noted within the cervix. Endometrium Thickness: 4 mm.  No focal abnormality visualized. Right ovary Measurements: 5.0 x 4.4 x 4.6 cm. = volume: 52 mL. 4.6 cm cyst is noted similar to that seen on the prior CT examination. No evidence of ovarian torsion is noted. Left ovary Not well visualized Pulsed Doppler evaluation of the right ovary demonstrates normal low-resistance arterial and venous waveforms. Other findings No abnormal free fluid. IMPRESSION: 4.6 cm right ovarian cyst. No evidence of torsion is noted. No follow-up is recommended. Small left uterine fibroid. Electronically Signed   By: Violeta Grey M.D.   On: 02/22/2024 22:26   CT ABDOMEN PELVIS W CONTRAST Result Date: 02/22/2024 EXAM: CT ABDOMEN AND PELVIS WITH CONTRAST 02/22/2024 08:44:09 PM TECHNIQUE: CT of the abdomen and pelvis was performed with the administration of intravenous contrast, 100mL of iohexol  (OMNIPAQUE ) 300 MG/ML solution. Multiplanar reformatted images are provided for review. Automated exposure control, iterative reconstruction, and/or weight-based adjustment of the mA/kV was utilized to reduce the radiation dose to as low as reasonably achievable. COMPARISON: Comparison to CT from 07/07/2022. CLINICAL HISTORY: Bowel obstruction suspected; RLQ  abdominal pain. Patient came in from home, complaining of increased weakness, nausea, and near syncopal episodes since yesterday. Had total knee surgery Tuesday. Left messages with office with no return phone call. FINDINGS: LOWER CHEST: No acute abnormality. LIVER: The liver is unremarkable. GALLBLADDER AND BILE DUCTS: Status post cholecystectomy. No biliary ductal dilatation. SPLEEN: No acute abnormality. PANCREAS: No acute abnormality. ADRENAL GLANDS: No acute abnormality. KIDNEYS, URETERS AND BLADDER: No stones in the kidneys or ureters. No hydronephrosis. No perinephric or periureteral stranding. Urinary bladder is unremarkable. GI AND BOWEL: Appendix is normal, as seen on image 71. Stomach demonstrates no acute abnormality. There is no bowel obstruction. No bowel wall thickening. REPRODUCTIVE ORGANS: A 5.6 cm right ovarian cystic lesion is present, previously 3.8 cm. Uterus and left ovary are unremarkable. PERITONEUM AND RETROPERITONEUM: No ascites. No free air. VASCULATURE: Aorta is normal in caliber. LYMPH NODES: No lymphadenopathy. BONES AND SOFT TISSUES: No acute  osseous abnormality. No focal soft tissue abnormality. IMPRESSION: 1. Normal appendix. 2. 5.6 cm right ovarian cystic lesion, increased. Consider pelvic ultrasound with doppler for further evaluation in this clinical setting. Electronically signed by: Zadie Herter MD 02/22/2024 08:54 PM EDT RP Workstation: [id number]    Disposition: Discharge disposition: 01-Home or Self Care       Discharge Instructions     Call MD / Call 911   Complete by: As directed    If you experience chest pain or shortness of breath, CALL 911 and be transported to the hospital emergency room.  If you develope a fever above 101 F, pus (Rufo drainage) or increased drainage or redness at the wound, or calf pain, call your surgeon's office.   Change dressing   Complete by: As directed    Maintain surgical dressing until follow up in the clinic. If the  edges start to pull up, may reinforce with tape. If the dressing is no longer working, may remove and cover with gauze and tape, but must keep the area dry and clean.  Call with any questions or concerns.   Constipation Prevention   Complete by: As directed    Drink plenty of fluids.  Prune juice may be helpful.  You may use a stool softener, such as Colace (over the counter) 100 mg twice a day.  Use MiraLax  (over the counter) for constipation as needed.   Diet - low sodium heart healthy   Complete by: As directed    Increase activity slowly as tolerated   Complete by: As directed    Weight bearing as tolerated with assist device (walker, cane, etc) as directed, use it as long as suggested by your surgeon or therapist, typically at least 4-6 weeks.   Post-operative opioid taper instructions:   Complete by: As directed    POST-OPERATIVE OPIOID TAPER INSTRUCTIONS: It is important to wean off of your opioid medication as soon as possible. If you do not need pain medication after your surgery it is ok to stop day one. Opioids include: Codeine, Hydrocodone (Norco, Vicodin), Oxycodone (Percocet, oxycontin ) and hydromorphone  amongst others.  Long term and even short term use of opiods can cause: Increased pain response Dependence Constipation Depression Respiratory depression And more.  Withdrawal symptoms can include Flu like symptoms Nausea, vomiting And more Techniques to manage these symptoms Hydrate well Eat regular healthy meals Stay active Use relaxation techniques(deep breathing, meditating, yoga) Do Not substitute Alcohol to help with tapering If you have been on opioids for less than two weeks and do not have pain than it is ok to stop all together.  Plan to wean off of opioids This plan should start within one week post op of your joint replacement. Maintain the same interval or time between taking each dose and first decrease the dose.  Cut the total daily intake of opioids by  one tablet each day Next start to increase the time between doses. The last dose that should be eliminated is the evening dose.      TED hose   Complete by: As directed    Use stockings (TED hose) for 2 weeks on both leg(s).  You may remove them at night for sleeping.        Follow-up Information     Claiborne Crew, MD. Schedule an appointment as soon as possible for a visit in 2 week(s).   Specialty: Orthopedic Surgery Contact information: [address] [place] [place] [address] [phone]  Signed: Earnie Gola 02/25/2024, 2:50 PM

## 2024-02-27 ENCOUNTER — Telehealth: Payer: Self-pay

## 2024-02-27 ENCOUNTER — Ambulatory Visit: Admitting: Physical Therapy

## 2024-02-27 ENCOUNTER — Inpatient Hospital Stay (HOSPITAL_COMMUNITY)
Admission: EM | Admit: 2024-02-27 | Discharge: 2024-03-01 | DRG: 690 | Disposition: A | Discharge status: Home / Self Care | Attending: Family Medicine | Admitting: Family Medicine

## 2024-02-27 ENCOUNTER — Encounter (HOSPITAL_COMMUNITY): Payer: Self-pay | Admitting: Emergency Medicine

## 2024-02-27 ENCOUNTER — Other Ambulatory Visit: Payer: Self-pay

## 2024-02-27 DIAGNOSIS — Z8249 Family history of ischemic heart disease and other diseases of the circulatory system: Secondary | ICD-10-CM

## 2024-02-27 DIAGNOSIS — Z833 Family history of diabetes mellitus: Secondary | ICD-10-CM

## 2024-02-27 DIAGNOSIS — R7881 Bacteremia: Principal | ICD-10-CM | POA: Diagnosis present

## 2024-02-27 DIAGNOSIS — Z7951 Long term (current) use of inhaled steroids: Secondary | ICD-10-CM

## 2024-02-27 DIAGNOSIS — N3010 Interstitial cystitis (chronic) without hematuria: Principal | ICD-10-CM | POA: Diagnosis present

## 2024-02-27 DIAGNOSIS — Z6839 Body mass index (BMI) 39.0-39.9, adult: Secondary | ICD-10-CM

## 2024-02-27 DIAGNOSIS — Z87440 Personal history of urinary (tract) infections: Secondary | ICD-10-CM

## 2024-02-27 DIAGNOSIS — R55 Syncope and collapse: Secondary | ICD-10-CM | POA: Diagnosis not present

## 2024-02-27 DIAGNOSIS — Z87891 Personal history of nicotine dependence: Secondary | ICD-10-CM

## 2024-02-27 DIAGNOSIS — Z9049 Acquired absence of other specified parts of digestive tract: Secondary | ICD-10-CM

## 2024-02-27 DIAGNOSIS — M459 Ankylosing spondylitis of unspecified sites in spine: Secondary | ICD-10-CM | POA: Diagnosis present

## 2024-02-27 DIAGNOSIS — Z7982 Long term (current) use of aspirin: Secondary | ICD-10-CM

## 2024-02-27 DIAGNOSIS — E66812 Obesity, class 2: Secondary | ICD-10-CM | POA: Diagnosis present

## 2024-02-27 DIAGNOSIS — Z8052 Family history of malignant neoplasm of bladder: Secondary | ICD-10-CM

## 2024-02-27 DIAGNOSIS — E860 Dehydration: Secondary | ICD-10-CM | POA: Diagnosis not present

## 2024-02-27 DIAGNOSIS — Z96653 Presence of artificial knee joint, bilateral: Secondary | ICD-10-CM | POA: Diagnosis present

## 2024-02-27 DIAGNOSIS — Z823 Family history of stroke: Secondary | ICD-10-CM

## 2024-02-27 DIAGNOSIS — J45909 Unspecified asthma, uncomplicated: Secondary | ICD-10-CM | POA: Diagnosis present

## 2024-02-27 DIAGNOSIS — N83201 Unspecified ovarian cyst, right side: Secondary | ICD-10-CM | POA: Diagnosis not present

## 2024-02-27 DIAGNOSIS — Z79890 Hormone replacement therapy (postmenopausal): Secondary | ICD-10-CM

## 2024-02-27 DIAGNOSIS — K5900 Constipation, unspecified: Secondary | ICD-10-CM | POA: Diagnosis not present

## 2024-02-27 DIAGNOSIS — E890 Postprocedural hypothyroidism: Secondary | ICD-10-CM | POA: Diagnosis present

## 2024-02-27 DIAGNOSIS — Z881 Allergy status to other antibiotic agents status: Secondary | ICD-10-CM

## 2024-02-27 DIAGNOSIS — Z803 Family history of malignant neoplasm of breast: Secondary | ICD-10-CM

## 2024-02-27 DIAGNOSIS — Z91040 Latex allergy status: Secondary | ICD-10-CM

## 2024-02-27 LAB — COMPREHENSIVE METABOLIC PANEL WITH GFR
ALT: 15 U/L (ref 0–44)
AST: 17 U/L (ref 15–41)
Albumin: 3.8 g/dL (ref 3.5–5.0)
Alkaline Phosphatase: 104 U/L (ref 38–126)
Anion gap: 14 (ref 5–15)
BUN: 19 mg/dL (ref 6–20)
CO2: 20 mmol/L — ABNORMAL LOW (ref 22–32)
Calcium: 9.1 mg/dL (ref 8.9–10.3)
Chloride: 103 mmol/L (ref 98–111)
Creatinine, Ser: 0.7 mg/dL (ref 0.44–1.00)
GFR, Estimated: >60 mL/min (ref >60)
Glucose, Bld: 141 mg/dL — ABNORMAL HIGH (ref 70–99)
Potassium: 3.8 mmol/L (ref 3.5–5.1)
Sodium: 137 mmol/L (ref 135–145)
Total Bilirubin: 0.7 mg/dL (ref 0.0–1.2)
Total Protein: 7.5 g/dL (ref 6.5–8.1)

## 2024-02-27 LAB — BLOOD CULTURE ID PANEL (REFLEXED) - BCID2

## 2024-02-27 LAB — URINALYSIS, W/ REFLEX TO CULTURE (INFECTION SUSPECTED)
Bacteria, UA: NONE SEEN
Bilirubin Urine: NEGATIVE
Glucose, UA: NEGATIVE mg/dL
Hgb urine dipstick: NEGATIVE
Ketones, ur: 5 mg/dL — AB
Leukocytes,Ua: NEGATIVE
Nitrite: NEGATIVE
Protein, ur: 30 mg/dL — AB
Specific Gravity, Urine: 1.036 — ABNORMAL HIGH (ref 1.005–1.030)
pH: 5 (ref 5.0–8.0)

## 2024-02-27 LAB — CBC WITH DIFFERENTIAL/PLATELET
Abs Immature Granulocytes: 0.05 10*3/uL (ref 0.00–0.07)
Basophils Absolute: 0.1 10*3/uL (ref 0.0–0.1)
Basophils Relative: 1 %
Eosinophils Absolute: 0.6 10*3/uL — ABNORMAL HIGH (ref 0.0–0.5)
Eosinophils Relative: 5 %
HCT: 38.5 % (ref 36.0–46.0)
Hemoglobin: 12.7 g/dL (ref 12.0–15.0)
Immature Granulocytes: 0 %
Lymphocytes Relative: 29 %
Lymphs Abs: 3.5 10*3/uL (ref 0.7–4.0)
MCH: 28.9 pg (ref 26.0–34.0)
MCHC: 33 g/dL (ref 30.0–36.0)
MCV: 87.7 fL (ref 80.0–100.0)
Monocytes Absolute: 0.9 10*3/uL (ref 0.1–1.0)
Monocytes Relative: 7 %
Neutro Abs: 7 10*3/uL (ref 1.7–7.7)
Neutrophils Relative %: 58 %
Platelets: 239 10*3/uL (ref 150–400)
RBC: 4.39 MIL/uL (ref 3.87–5.11)
RDW: 14.8 % (ref 11.5–15.5)
WBC: 12.2 10*3/uL — ABNORMAL HIGH (ref 4.0–10.5)
nRBC: 0 % (ref 0.0–0.2)

## 2024-02-27 LAB — HCG, QUANTITATIVE, PREGNANCY: hCG, Beta Chain, Quant, S: 3 m[IU]/mL (ref <5)

## 2024-02-27 LAB — HIV ANTIBODY (ROUTINE TESTING W REFLEX): HIV Screen 4th Generation wRfx: NONREACTIVE

## 2024-02-27 LAB — HCG, SERUM, QUALITATIVE: Preg, Serum: NEGATIVE

## 2024-02-27 LAB — I-STAT CG4 LACTIC ACID, ED: Lactic Acid, Venous: 2 mmol/L (ref 0.5–1.9)

## 2024-02-27 MED ORDER — ONDANSETRON HCL 4 MG/2ML IJ SOLN: 4.0000 mg | Freq: Four times a day (QID) | INTRAMUSCULAR | Status: DC | PRN

## 2024-02-27 MED ORDER — POLYETHYLENE GLYCOL 3350 17 G PO PACK
17.0000 g | PACK | Freq: Two times a day (BID) | ORAL | Status: DC
  Administered 2024-02-27 – 2024-03-01 (×4): 17 g via ORAL
  Filled 2024-02-27 (×6): qty 1

## 2024-02-27 MED ORDER — SODIUM CHLORIDE 0.9 % IV SOLN
2.0000 g | INTRAVENOUS | Status: DC
  Administered 2024-02-28 – 2024-02-29 (×2): 2 g via INTRAVENOUS
  Filled 2024-02-27 (×2): qty 20

## 2024-02-27 MED ORDER — SODIUM CHLORIDE 0.9 % IV BOLUS
1000.0000 mL | Freq: Once | INTRAVENOUS | Status: AC
  Administered 2024-02-27: 1000 mL via INTRAVENOUS

## 2024-02-27 MED ORDER — HYDROMORPHONE HCL 1 MG/ML IJ SOLN
0.5000 mg | Freq: Four times a day (QID) | INTRAMUSCULAR | Status: DC | PRN
  Administered 2024-02-27: 0.5 mg via INTRAVENOUS
  Filled 2024-02-27: qty 0.5

## 2024-02-27 MED ORDER — ENOXAPARIN SODIUM 40 MG/0.4ML IJ SOSY
40.0000 mg | PREFILLED_SYRINGE | INTRAMUSCULAR | Status: DC
  Administered 2024-02-27: 40 mg via SUBCUTANEOUS
  Filled 2024-02-27: qty 0.4

## 2024-02-27 MED ORDER — FLUTICASONE FUROATE-VILANTEROL 100-25 MCG/ACT IN AEPB: 1.0000 | INHALATION_SPRAY | Freq: Every day | RESPIRATORY_TRACT | Status: DC | PRN

## 2024-02-27 MED ORDER — SODIUM CHLORIDE 0.9 % IV SOLN: 1.0000 g | INTRAVENOUS | Status: DC

## 2024-02-27 MED ORDER — ONDANSETRON HCL 4 MG PO TABS: 4.0000 mg | ORAL_TABLET | Freq: Four times a day (QID) | ORAL | Status: DC | PRN

## 2024-02-27 MED ORDER — SODIUM CHLORIDE 0.9 % IV SOLN
2.0000 g | Freq: Once | INTRAVENOUS | Status: AC
  Administered 2024-02-27: 2 g via INTRAVENOUS
  Filled 2024-02-27: qty 20

## 2024-02-27 MED ORDER — ACETAMINOPHEN 650 MG RE SUPP: 650.0000 mg | Freq: Four times a day (QID) | RECTAL | Status: DC | PRN

## 2024-02-27 MED ORDER — ASPIRIN 81 MG PO CHEW
81.0000 mg | CHEWABLE_TABLET | Freq: Two times a day (BID) | ORAL | Status: DC
  Administered 2024-02-27 – 2024-03-01 (×6): 81 mg via ORAL
  Filled 2024-02-27 (×6): qty 1

## 2024-02-27 MED ORDER — SENNOSIDES-DOCUSATE SODIUM 8.6-50 MG PO TABS
1.0000 | ORAL_TABLET | Freq: Every evening | ORAL | Status: DC | PRN
  Filled 2024-02-27: qty 1

## 2024-02-27 MED ORDER — CELECOXIB 200 MG PO CAPS
200.0000 mg | ORAL_CAPSULE | Freq: Two times a day (BID) | ORAL | Status: DC
  Administered 2024-02-28 – 2024-03-01 (×5): 200 mg via ORAL
  Filled 2024-02-27 (×5): qty 1

## 2024-02-27 MED ORDER — ACETAMINOPHEN 325 MG PO TABS
650.0000 mg | ORAL_TABLET | Freq: Four times a day (QID) | ORAL | Status: DC | PRN
Start: 2024-02-27 — End: 2024-03-01
  Administered 2024-02-27 – 2024-03-01 (×3): 650 mg via ORAL
  Filled 2024-02-27 (×3): qty 2

## 2024-02-27 MED ORDER — SENNA 8.6 MG PO TABS
2.0000 | ORAL_TABLET | Freq: Every day | ORAL | Status: DC
  Administered 2024-02-28 – 2024-02-29 (×2): 17.2 mg via ORAL
  Filled 2024-02-27 (×2): qty 2

## 2024-02-27 MED ORDER — LEVOTHYROXINE SODIUM 100 MCG PO TABS
200.0000 ug | ORAL_TABLET | Freq: Every day | ORAL | Status: DC
  Administered 2024-02-28 – 2024-03-01 (×3): 200 ug via ORAL
  Filled 2024-02-27 (×3): qty 2

## 2024-02-27 NOTE — Therapy (Deleted)
 OUTPATIENT PHYSICAL THERAPY KNEE EVALUATION  Patient Name: Hailey [name] MRN: [id number] DOB:1976/07/16, 48 y.o., female Today's Date: 02/27/2024  END OF SESSION:   Past Medical History:  Diagnosis Date   Allergic state    Allergy    Ankylosing spondylitis (HCC)    followed by rheumatology Dr. Lydia Sams   Arthritis    Asthma    Cervical disc disease    Chest pain    Depression    Enlarged lymph nodes    GERD (gastroesophageal reflux disease)    H/O colonoscopy    HPV (human papilloma virus) infection    Hypothyroidism    Interstitial cystitis    LGSIL (low grade squamous intraepithelial dysplasia)    Paresthesia    Pre-diabetes    Status post laparoscopic cholecystectomy 07/13/2016   Past Surgical History:  Procedure Laterality Date   BREAST BIOPSY Right 10/18/2015   bx/clip- neg   CHOLECYSTECTOMY N/A 07/07/2016   Procedure: LAPAROSCOPIC CHOLECYSTECTOMY;  Surgeon: Emmalene Hare, MD;  Location: [facility] ORS;  Service: General;  Laterality: N/A;   COLONOSCOPY WITH PROPOFOL  N/A 10/11/2015   Procedure: COLONOSCOPY WITH PROPOFOL ;  Surgeon: Deveron Fly, MD;  Location: [facility] ENDOSCOPY;  Service: Endoscopy;  Laterality: N/A;   ESOPHAGOGASTRODUODENOSCOPY (EGD) WITH PROPOFOL  N/A 10/11/2015   Procedure: ESOPHAGOGASTRODUODENOSCOPY (EGD) WITH PROPOFOL ;  Surgeon: Deveron Fly, MD;  Location[facility] ENDOSCOPY;  Service: Endoscopy;  Laterality: N/A;   TONSILLECTOMY     TOTAL KNEE ARTHROPLASTY Left 01/08/2024   Procedure: LEFT TOTAL KNEE ARTHROPLASTYL;  Surgeon: Claiborne Crew, MD;  Location: [facility] ORS;  Service: Orthopedics;  Laterality: Left;   TOTAL KNEE ARTHROPLASTY Right 02/19/2024   Procedure: ARTHROPLASTY, KNEE, TOTAL;  Surgeon: Claiborne Crew, MD;  Location: [facility] ORS;  Service: Orthopedics;  Laterality: Right;   TOTAL THYROIDECTOMY  2005   goiter   Patient Active Problem List   Diagnosis Date Noted   S/P total knee arthroplasty, right 02/19/2024   S/P total knee arthroplasty, left  01/08/2024   S/P total knee arthroplasty 01/08/2024   Atypical chest pain 11/23/2023   Preop cardiovascular exam 11/23/2023   Dyslipidemia 11/23/2023   Generalized lymphadenopathy 09/23/2021   B12 deficiency 09/23/2021   Fatigue 09/23/2021   Primary osteoarthritis of both knees 04/29/2021   Ankylosing spondylitis (HCC) 12/30/2018   Asthma in adult without complication 12/30/2018   Chronic diarrhea 12/30/2018   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   Interstitial cystitis 07/03/2016   Pelvic pain in female 07/03/2016   Chondromalacia of knee, 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   BMI 38.0-38.9,adult 02/14/2016   Polyarthralgia 02/14/2016   GERD (gastroesophageal reflux disease) 02/10/2014    PCP: Sheron Dixons, MD  REFERRING PROVIDER: Earnie Gola, PA-C  REFERRING DIAG: M17.11 (ICD-10-CM) - Unilateral primary osteoarthritis, right knee   RATIONALE FOR EVALUATION AND TREATMENT: Rehabilitation  THERAPY DIAG: No diagnosis found.  ONSET DATE: R TKA 02/19/24, Hx L TKA 01/08/24  FOLLOW-UP APPT SCHEDULED WITH REFERRING PROVIDER: {yes/no:20286}    SUBJECTIVE:  SUBJECTIVE STATEMENT:  Pt is a 48 year old female known to this clinic with Hx of L TKA 01/08/24 who underwent post-op rehab with good ROM progress. Pt is currently s/p R TKA 02/19/24.   PERTINENT HISTORY: Pt is a 48 year old female known to this clinic with Hx of L TKA 01/08/24 who underwent post-op rehab with good ROM progress. Pt is currently s/p R TKA 02/19/24.   Pt has unfortunately had significant challenges with early post-op phase during both knee replacements. Pt had ED visits on 6/13 and 6/14 related to nausea and weakness following Sx. Infection, CVD, PE, and acute process in abdomen/pelvis ruled out.    PAIN:   Pain Intensity: Present: /10, Best:  /10, Worst: /10 Pain location: *** Pain quality: {PAIN DESCRIPTION:21022940}  Radiating pain: {yes/no:20286}  Swelling: {yes/no:20286}  Popping, catching, locking: {yes/no:20286}  Numbness/Tingling: {yes/no:20286} Focal weakness or buckling: {yes/no:20286} Aggravating factors: *** Relieving factors: *** 24-hour pain behavior: *** How long can you sit: How long can you stand: History of prior back, hip, or knee injury, pain, surgery, or therapy: {yes/no:20286} Dominant hand: {RIGHT/LEFT:20294} Imaging: {yes/no:20286}  Prior level of function: {PLOF:24004} Occupational demands: Hobbies: Red flags: Negative for personal history of cancer, chills/fever, night sweats, nausea, vomiting, unexplained weight gain/loss, unrelenting pain  PRECAUTIONS: {Therapy precautions:24002}  WEIGHT BEARING RESTRICTIONS: {Yes ***/No:24003}  FALLS: Has patient fallen in last 6 months? {fallsyesno:27318}  Living Environment Lives with: {[facility] lives with:25569::lives with their family} Lives in: {Lives in:25570}  Patient Goals: ***   OBJECTIVE:   Patient Surveys  LEFS = ***  Cognition Patient is oriented to person, place, and time.  Recent memory is intact.  Remote memory is intact.  Attention span and concentration are intact.  Expressive speech is intact.  Patient's fund of knowledge is within normal limits for educational level.    Gross Musculoskeletal Assessment Tremor: None Bulk: Normal Tone: Normal  GAIT: Distance walked: *** Assistive device utilized: {Assistive devices:23999} Level of assistance: {Levels of assistance:24026} Comments: ***  Posture:  AROM AROM (Normal range in degrees) AROM 02/27/24  Hip Right Left  Flexion (125)    Extension (15)    Abduction (40)    Adduction     Internal Rotation (45)    External Rotation (45)        Knee    Flexion (135)    Extension (0)        Ankle    Dorsiflexion (20)    Plantarflexion (50)    Inversion (35)     Eversion (15    (* = pain; Blank rows = not tested)  LE MMT: MMT (out of 5) Right 02/27/24 Left 02/27/24  Hip flexion    Hip extension    Hip abduction    Hip adduction    Hip internal rotation    Hip external rotation    Knee flexion    Knee extension    Ankle dorsiflexion    Ankle plantarflexion    Ankle inversion    Ankle eversion    (* = pain; Blank rows = not tested)  Sensation Grossly intact to light touch bilateral LEs as determined by testing dermatomes L2-S2. Proprioception, and hot/cold testing deferred on this date.  Reflexes R/L Knee Jerk (L3/4): 2+/2+  Ankle Jerk (S1/2): 2+/2+   Muscle Length Hamstrings: R: {NEGATIVE/POSITIVE FAO:13086} L: {NEGATIVE/POSITIVE VHQ:46962} Quadriceps [facility]): R: {NEGATIVE/POSITIVE XBM:84132} L: {NEGATIVE/POSITIVE GMW:10272} Hip flexors Andy Bannister): R: {NEGATIVE/POSITIVE ZDG:64403} L: {NEGATIVE/POSITIVE KVQ:25956} IT band Asencion Lawless): R: {NEGATIVE/POSITIVE LOV:56433} L: {NEGATIVE/POSITIVE IRJ:18841}  Palpation Location LEFT  RIGHT           Quadriceps    Medial Hamstrings    Lateral Hamstrings    Lateral Hamstring tendon    Medial Hamstring tendon    Quadriceps tendon    Patella    Patellar Tendon    Tibial Tuberosity    Medial joint line    Lateral joint line    MCL    LCL    Adductor Tubercle    Pes Anserine tendon    Infrapatellar fat pad    Fibular head    Popliteal fossa    (Blank rows = not tested) Graded on 0-4 scale (0 = no pain, 1 = pain, 2 = pain with wincing/grimacing/flinching, 3 = pain with withdrawal, 4 = unwilling to allow palpation), (Blank rows = not tested)  Passive Accessory Motion Tibiofemoral: Distraction: R: {NEGATIVE/POSITIVE ZOX:09604} L: {NEGATIVE/POSITIVE VWU:98119} Anterior Glide: R: {NEGATIVE/POSITIVE JYN:82956} L: {NEGATIVE/POSITIVE OZH:08657} Posterior Glide: R: {NEGATIVE/POSITIVE QIO:96295} L: {NEGATIVE/POSITIVE MWU:13244} Medial Glide: R: {NEGATIVE/POSITIVE WNU:27253} L: {NEGATIVE/POSITIVE  GUY:40347} Lateral Glide: R: {NEGATIVE/POSITIVE QQV:95638} L: {NEGATIVE/POSITIVE VFI:43329}  Fibula on Femur: Anterior: R: {NEGATIVE/POSITIVE JJO:84166} L: {NEGATIVE/POSITIVE AYT:01601} Posterior: R: {NEGATIVE/POSITIVE UXN:23557} L: {NEGATIVE/POSITIVE DUK:02542}  Patellofemoral: Superior Glide: R: {NEGATIVE/POSITIVE HCW:23762} L: {NEGATIVE/POSITIVE GBT:51761} Inferior Glide: R: {NEGATIVE/POSITIVE YWV:37106} L: {NEGATIVE/POSITIVE YIR:48546} Medial Glide: R: {NEGATIVE/POSITIVE EVO:35009} L: {NEGATIVE/POSITIVE FGH:82993} Lateral Glide: R: {NEGATIVE/POSITIVE ZJI:96789} L: {NEGATIVE/POSITIVE FYB:01751}  Medial Tilt: R: {NEGATIVE/POSITIVE WCH:85277} L: {NEGATIVE/POSITIVE OEU:23536} Lateral Tilt: R: {NEGATIVE/POSITIVE RWE:31540} L: {NEGATIVE/POSITIVE GQQ:76195}   VASCULAR Dorsalis pedis and posterior tibial pulses are palpable   SPECIAL TESTS  Ligamentous Stability  ACL: Lachman's: R: {NEGATIVE/POSITIVE KDT:26712} L: {NEGATIVE/POSITIVE WPY:09983} Active Lachman's: R: {NEGATIVE/POSITIVE JAS:50539} L: {NEGATIVE/POSITIVE JQB:34193} Anterior Drawer: R: {NEGATIVE/POSITIVE XTK:24097} L: {NEGATIVE/POSITIVE DZH:29924} Pivot Shift: R: {NEGATIVE/POSITIVE QAS:34196} L: {NEGATIVE/POSITIVE QIW:97989}  PCL: Posterior Drawer: R: {NEGATIVE/POSITIVE QJJ:94174} L: {NEGATIVE/POSITIVE YCX:44818} Reverse Lachman's: R: {NEGATIVE/POSITIVE HUD:14970} L: {NEGATIVE/POSITIVE YOV:78588} Posterior Sag Sign: R: {NEGATIVE/POSITIVE FOY:77412} L: {NEGATIVE/POSITIVE INO:67672}  MCL: Valgus Stress (30 degrees flexion): R: {NEGATIVE/POSITIVE CNO:70962} L: {NEGATIVE/POSITIVE EZM:62947}  LCL: Varus Stress (30 degrees flexion): R: {NEGATIVE/POSITIVE MLY:65035} L: {NEGATIVE/POSITIVE WSF:68127}  Meniscus Tests McMurray's Test:  Medial Meniscus (Tibial ER): R: {NEGATIVE/POSITIVE NTZ:00174} L: {NEGATIVE/POSITIVE BSW:96759} Lateral Meniscus (Tibial IR): R: {NEGATIVE/POSITIVE FMB:84665} L: {NEGATIVE/POSITIVE  LDJ:57017}  Thessaly: R: {NEGATIVE/POSITIVE BLT:90300} L: {NEGATIVE/POSITIVE PQZ:30076} Ege's: R: {NEGATIVE/POSITIVE AUQ:33354} L: {NEGATIVE/POSITIVE TGY:56389} Apley's: R: {NEGATIVE/POSITIVE HTD:42876} L: {NEGATIVE/POSITIVE OTL:57262} Steinmann Sign I: R: {NEGATIVE/POSITIVE MBT:59741} L: {NEGATIVE/POSITIVE ULA:45364} Steinmann Sign II: R: {NEGATIVE/POSITIVE WOE:32122} L: {NEGATIVE/POSITIVE QMG:50037} Axial Pivot-Shift: R: {NEGATIVE/POSITIVE CWU:88916} L: {NEGATIVE/POSITIVE XIH:03888} Dynamic Knee Test: R: {NEGATIVE/POSITIVE KCM:03491} L: {NEGATIVE/POSITIVE PHX:50569} Duck Waddle Test: R: {NEGATIVE/POSITIVE VXY:80165} L: {NEGATIVE/POSITIVE VVZ:48270} Effusion: R: {NEGATIVE/POSITIVE BEM:75449} L: {NEGATIVE/POSITIVE EEF:00712}   Patellofemoral Pain Syndrome Patellar Tilt (Lateral): R: {NEGATIVE/POSITIVE RFX:58832} L: {NEGATIVE/POSITIVE PQD:82641} Patellar Apprehension: R: {NEGATIVE/POSITIVE RAX:09407} L: {NEGATIVE/POSITIVE WKG:88110} Squatting pain: R: {NEGATIVE/POSITIVE RPR:94585} L: {NEGATIVE/POSITIVE FYT:24462} Stair climbing pain: R: {NEGATIVE/POSITIVE MMN:81771} L: {NEGATIVE/POSITIVE HAF:79038} Kneeling pain: R: {NEGATIVE/POSITIVE BFX:83291} L: {NEGATIVE/POSITIVE BTY:60600} Resisted knee extension pain: R: {NEGATIVE/POSITIVE FOR:19998} L: {NEGATIVE/POSITIVE KHT:97741} Compression: R: {NEGATIVE/POSITIVE SEL:95320} L: {NEGATIVE/POSITIVE EBX:43568} Clarke's sign: R: {NEGATIVE/POSITIVE SHU:83729} L: {NEGATIVE/POSITIVE MSX:11552} Lateral Pull: R: {NEGATIVE/POSITIVE CEY:22336} L: {NEGATIVE/POSITIVE PQA:44975}  Patellar Tendinopathy Inferior pole palpation with anterior tilt: R: {NEGATIVE/POSITIVE PYY:51102} L: {NEGATIVE/POSITIVE TRZ:73567}   Beighton Scale  LEFT  RIGHT           1. Passive dorsiflexion and hyperextension of the fifth MCP joint beyond 90  0 0   2. Passive apposition of the thumb to the flexor aspect of the  forearm  0  0   3. Passive hyperextension of the elbow beyond 10   0  0   4. Passive hyperextension of the knee beyond 10  0  0   5. Active forward flexion of the trunk with the knees fully extended so that the palms of the hands rest flat on the floor   0   TOTAL         0/ 9    [place] Knee Rules for Acute Knee injury  Yes/No         1. Aged 55 years or over {yes/no:20286}   2. Tenderness at the head of the fibula {yes/no:20286}   3. Isolated tenderness of the patella  {yes/no:20286}   4. Inability to flex knee to 90 degrees {yes/no:20286}   5. Inability to bear weight (defined as an inability to take 4 steps, ie two steps on each leg, regardless of limping) immediately and at presentation {yes/no:20286}     TODAY'S TREATMENT:   PATIENT EDUCATION:  Education details: *** Person educated: {Person educated:25204} Education method: {Education Method:25205} Education comprehension: {Education Comprehension:25206}   HOME EXERCISE PROGRAM:    ASSESSMENT:  CLINICAL IMPRESSION: Patient is a *** y.o. *** who was seen today for physical therapy evaluation and treatment for ***.   OBJECTIVE IMPAIRMENTS: {opptimpairments:25111}.   ACTIVITY LIMITATIONS: {activitylimitations:27494}  PARTICIPATION LIMITATIONS: {participationrestrictions:25113}  PERSONAL FACTORS: {Personal factors:25162} are also affecting patient's functional outcome.   REHAB POTENTIAL: {rehabpotential:25112}  CLINICAL DECISION MAKING: {clinical decision making:25114}  EVALUATION COMPLEXITY: {Evaluation complexity:25115}   GOALS: Goals reviewed with patient? Yes  SHORT TERM GOALS: Target date: 03/20/2024  Pt will be independent with HEP to improve strength and decrease knee pain to improve pain-free function at home and work. Baseline: 02/27/24: Baseline HEP reviewed.  Goal status: INITIAL   LONG TERM GOALS: Target date: 04/24/2024  Pt will increase FOTO to at least *** to demonstrate significant improvement in function at home and work related to knee pain  Baseline:   Goal status: INITIAL  2.  Pt will decrease worst knee pain by at least 3 points on the NPRS in order to demonstrate clinically significant reduction in knee pain. Baseline: *** Goal status: INITIAL  3.  Pt will decrease LEFS score by at least 9 points in order demonstrate clinically significant reduction in knee pain/disability.       Baseline: *** Goal status: INITIAL  4.  Pt will increase strength of *** by at least 1/2 MMT grade in order to demonstrate improvement in strength and function  Baseline: *** Goal status: INITIAL   PLAN: PT FREQUENCY: 1-2x/week  PT DURATION: 6-8 weeks  PLANNED INTERVENTIONS: Therapeutic exercises, Therapeutic activity, Neuromuscular re-education, Balance training, Gait training, Patient/Family education, Self Care, Joint mobilization, Joint manipulation, Vestibular training, Canalith repositioning, Orthotic/Fit training, DME instructions, Dry Needling, Electrical stimulation, Spinal manipulation, Spinal mobilization, Cryotherapy, Moist heat, Taping, Traction, Ultrasound, Ionotophoresis 4mg /ml Dexamethasone , Manual therapy, and Re-evaluation.  PLAN FOR NEXT SESSION: ***   Denese Finn, PT, DPT [phone]  Aleatha Hunting, PT 02/27/2024, 12:11 PM

## 2024-02-27 NOTE — ED Provider Notes (Signed)
 [place] EMERGENCY DEPARTMENT AT [facility] Provider Note   CSN: [id number] Arrival date & time: 02/27/24  1344     Patient presents with: Abnormal Lab   Hailey [name] is a 48 y.o. female.   48 year old female presents due to positive blood cultures.  Cultures were done approximately 4 days ago.  She has had recent right knee replacement.  Notes no signs of infection from that site.  Actually saw the patient last week for generalized malaise.  States she has had no temperature at home.  No vomiting or diarrhea.  Has had some urinary symptoms.  Patient's blood cultures were positive for gram-negative rods.       Prior to Admission medications   Medication Sig Start Date End Date Taking? Authorizing Provider  albuterol  (VENTOLIN  HFA) 108 (90 Base) MCG/ACT inhaler Inhale 2 puffs into the lungs every 6 (six) hours as needed for wheezing or shortness of breath. 10/12/23   Sheron Dixons, MD  aspirin  81 MG chewable tablet Chew 1 tablet (81 mg total) by mouth 2 (two) times daily. 02/18/24     celecoxib  (CELEBREX ) 200 MG capsule Take 1 capsule (200 mg total) by mouth 2 (two) times daily with a meal. 12/24/23     fluticasone -salmeterol (ADVAIR  DISKUS) 100-50 MCG/ACT AEPB Inhale 1 puff into the lungs 2 (two) times daily as needed. Patient taking differently: Inhale 1 puff into the lungs daily as needed (SOB). 10/12/23   Sheron Dixons, MD  hydrOXYzine  (ATARAX ) 25 MG tablet Take 1 tablet by mouth at bedtime as needed for sleep 02/07/24     methocarbamol  (ROBAXIN ) 500 MG tablet Take 1 tablet (500 mg total) by mouth every 6 (six) hours as needed. 12/24/23     metoCLOPramide  (REGLAN ) 10 MG tablet Take 1 tablet (10 mg total) by mouth every 6 (six) hours as needed for nausea. 02/22/24   Spence Dux, PA-C  Multiple Vitamin (MULTIVITAMIN WITH MINERALS) TABS tablet Take 1 tablet by mouth daily.    [provider]  nystatin  cream (MYCOSTATIN ) Apply 1 Application topically 2 (two)  times daily as needed (rash).    [provider]  ondansetron  (ZOFRAN -ODT) 4 MG disintegrating tablet Take 1 tablet by mouth every 6 hours as needed for nausea/vomiting 02/18/24     oxyCODONE  (OXY IR/ROXICODONE ) 5 MG immediate release tablet Take 1 tablet (5 mg total) by mouth every 4 (four) hours as needed for severe pain. 02/18/24     polyethylene glycol powder (GLYCOLAX /MIRALAX ) 17 GM/SCOOP powder Mix 1 capful (17 grams) in liquid and drink by mouth 2 (two) times daily. 02/18/24     promethazine  (PHENERGAN ) 25 MG tablet Take 1 tablet (25 mg total) by mouth every 6 (six) hours as needed for nausea or vomiting. 02/23/24   Curatolo, Adam, DO  senna (SENOKOT) 8.6 MG tablet Take 2 tablets (17.2 mg total) by mouth at bedtime for 14 days. 02/18/24 03/03/24    SYNTHROID  200 MCG tablet Take 1 tablet (200 mcg total) by mouth once daily. Take on an empty stomach with a glass of water  at least 30-60 minutes before breakfast. 06/18/23     tiZANidine  (ZANAFLEX ) 4 MG tablet Take 1 tablet (4 mg total) by mouth at bedtime as needed for muscle spasm or pain 02/13/24       Allergies: Levofloxacin, Lodine [etodolac], Adalimumab , Ciprofloxacin, and Latex    Review of Systems  All other systems reviewed and are negative.   Updated Vital Signs BP 110/63   Pulse  91   Temp (!) 97.2 F (36.2 C)   Resp 17   LMP 11/27/2023   SpO2 99%   Physical Exam Vitals and nursing note reviewed.  Constitutional:      General: She is not in acute distress.    Appearance: Normal appearance. She is well-developed. She is not toxic-appearing.  HENT:     Head: Normocephalic and atraumatic.   Eyes:     General: Lids are normal.     Conjunctiva/sclera: Conjunctivae normal.     Pupils: Pupils are equal, round, and reactive to light.   Neck:     Thyroid : No thyroid  mass.     Trachea: No tracheal deviation.   Cardiovascular:     Rate and Rhythm: Normal rate and regular rhythm.     Heart sounds: Normal heart sounds. No  murmur heard.    No gallop.  Pulmonary:     Effort: Pulmonary effort is normal. No respiratory distress.     Breath sounds: Normal breath sounds. No stridor. No decreased breath sounds, wheezing, rhonchi or rales.  Abdominal:     General: There is no distension.     Palpations: Abdomen is soft.     Tenderness: There is no abdominal tenderness. There is no rebound.   Musculoskeletal:        General: No tenderness. Normal range of motion.     Cervical back: Normal range of motion and neck supple.       Legs:   Skin:    General: Skin is warm and dry.     Findings: No abrasion or rash.   Neurological:     Mental Status: She is alert and oriented to person, place, and time. Mental status is at baseline.     GCS: GCS eye subscore is 4. GCS verbal subscore is 5. GCS motor subscore is 6.     Cranial Nerves: No cranial nerve deficit.     Sensory: No sensory deficit.     Motor: Motor function is intact.   Psychiatric:        Attention and Perception: Attention normal.        Speech: Speech normal.        Behavior: Behavior normal.     (all labs ordered are listed, but only abnormal results are displayed) Labs Reviewed  CBC WITH DIFFERENTIAL/PLATELET - Abnormal; Notable for the following components:      Result Value   WBC 12.2 (*)    Eosinophils Absolute 0.6 (*)    All other components within normal limits  I-STAT CG4 LACTIC ACID, ED - Abnormal; Notable for the following components:   Lactic Acid, Venous 2.0 (*)    All other components within normal limits  HCG, QUANTITATIVE, PREGNANCY  URINALYSIS, W/ REFLEX TO CULTURE (INFECTION SUSPECTED)  HCG, SERUM, QUALITATIVE  COMPREHENSIVE METABOLIC PANEL WITH GFR    EKG: None  Radiology: No results found.   Procedures   Medications Ordered in the ED - No data to display                                  Medical Decision Making Amount and/or Complexity of Data Reviewed Labs: ordered.   Blood cultures repeated patient  started on IV fluids.  Will also start IV antibiotics and admit     Final diagnoses:  None    ED Discharge Orders     None  Lind Repine, MD 02/27/24 [phone]

## 2024-02-27 NOTE — Transitions of Care (Post Inpatient/ED Visit) (Signed)
 02/27/2024  Name: Hailey [name] MRN: [id number] DOB: Jun 22, 1976  Today's TOC FU Call Status: Today's TOC FU Call Status:: Successful TOC FU Call Completed TOC FU Call Complete Date: 02/27/24 Patient's Name and Date of Birth confirmed.  Transition Care Management Follow-up Telephone Call Date of Discharge: 02/23/24 Discharge Facility: [facility]) Type of Discharge: Emergency Department Reason for ED Visit: Other: (Weakness) How have you been since you were released from the hospital?: Better Any questions or concerns?: No  Items Reviewed: Did you receive and understand the discharge instructions provided?: No Medications obtained,verified, and reconciled?: Yes (Medications Reviewed) Any new allergies since your discharge?: No Dietary orders reviewed?: NA Do you have support at home?: Yes People in Home [RPT]: child(ren), adult, spouse  Medications Reviewed Today: Medications Reviewed Today     Reviewed by Ottavio Norem N, CMA (Certified Medical Assistant) on 02/27/24 at 1116  Med List Status: <None>   Medication Order Taking? Sig Documenting Provider Last Dose Status Informant  albuterol  (VENTOLIN  HFA) 108 (90 Base) MCG/ACT inhaler [id number] Yes Inhale 2 puffs into the lungs every 6 (six) hours as needed for wheezing or shortness of breath. Sheron Dixons, MD  Active Self  aspirin  81 MG chewable tablet [id number] Yes Chew 1 tablet (81 mg total) by mouth 2 (two) times daily.   Active   celecoxib  (CELEBREX ) 200 MG capsule [id number] Yes Take 1 capsule (200 mg total) by mouth 2 (two) times daily with a meal.   Active Self  fluticasone -salmeterol (ADVAIR  DISKUS) 100-50 MCG/ACT AEPB [id number] Yes Inhale 1 puff into the lungs 2 (two) times daily as needed.  Patient taking differently: Inhale 1 puff into the lungs daily as needed (SOB).   Sheron Dixons, MD  Active Self  hydrOXYzine  (ATARAX ) 25 MG tablet [id number] Yes Take 1 tablet by mouth at bedtime as needed for sleep    Active   methocarbamol  (ROBAXIN ) 500 MG tablet [id number] Yes Take 1 tablet (500 mg total) by mouth every 6 (six) hours as needed.   Active Self  metoCLOPramide  (REGLAN ) 10 MG tablet [id number] Yes Take 1 tablet (10 mg total) by mouth every 6 (six) hours as needed for nausea. Spence Dux, PA-C  Active   Multiple Vitamin (MULTIVITAMIN WITH MINERALS) TABS tablet [id number] Yes Take 1 tablet by mouth daily. [provider]  Active Self  nystatin  cream (MYCOSTATIN ) [id number] Yes Apply 1 Application topically 2 (two) times daily as needed (rash). [provider]  Active Self  ondansetron  (ZOFRAN -ODT) 4 MG disintegrating tablet [id number] Yes Take 1 tablet by mouth every 6 hours as needed for nausea/vomiting   Active   oxyCODONE  (OXY IR/ROXICODONE ) 5 MG immediate release tablet [id number] Yes Take 1 tablet (5 mg total) by mouth every 4 (four) hours as needed for severe pain.   Active   polyethylene glycol powder (GLYCOLAX /MIRALAX ) 17 GM/SCOOP powder [id number] Yes Mix 1 capful (17 grams) in liquid and drink by mouth 2 (two) times daily.   Active            Med Note Murphy Arn, BESSIE R   Tue Feb 19, 2024  5:51 AM) Has not started yet   promethazine  (PHENERGAN ) 25 MG tablet [id number] Yes Take 1 tablet (25 mg total) by mouth every 6 (six) hours as needed for nausea or vomiting. Lowery Rue, DO  Active   senna (SENOKOT) 8.6 MG tablet [id number] Yes Take 2 tablets (17.2 mg total) by mouth at bedtime for 14 days.   Active  Med Note Murphy Arn, BESSIE R   Tue Feb 19, 2024  5:52 AM) Has not started yet   SYNTHROID  200 MCG tablet [id number] Yes Take 1 tablet (200 mcg total) by mouth once daily. Take on an empty stomach with a glass of water  at least 30-60 minutes before breakfast.   Active Self  tiZANidine  (ZANAFLEX ) 4 MG tablet [id number] Yes Take 1 tablet (4 mg total) by mouth at bedtime as needed for muscle spasm or pain   Active             Home Care and Equipment/Supplies: Were Home  Health Services Ordered?: NA Any new equipment or medical supplies ordered?: NA  Functional Questionnaire: Do you need assistance with bathing/showering or dressing?: No Do you need assistance with meal preparation?: No Do you need assistance with eating?: No Do you have difficulty maintaining continence: No Do you need assistance with getting out of bed/getting out of a chair/moving?: No Do you have difficulty managing or taking your medications?: No  Follow up appointments reviewed: PCP Follow-up appointment confirmed?: NA Specialist Hospital Follow-up appointment confirmed?: NA Do you need transportation to your follow-up appointment?: No Do you understand care options if your condition(s) worsen?: Yes-patient verbalized understanding    Hailey [name] [facility]  Primary Care & Sports Medicine at [facility] CMA, AAMA [address]  [place] [address] Office [phone]  Fax: [phone]

## 2024-02-27 NOTE — ED Notes (Signed)
 Called patient and advised her to come back to an ED based on +blood culture results. Left voicemail.

## 2024-02-27 NOTE — ED Triage Notes (Signed)
 Patient presents because her blood cultures resulted positive. She has had multiple knee surgeries, that last being 1 week ago. She was told to come here. She has had no complaints outside of the need for cultures.

## 2024-02-27 NOTE — H&P (Addendum)
 History and Physical  Hailey [name] YQM:[id number] DOB: 27-Mar-1976 DOA: 02/27/2024  PCP: Sheron Dixons, MD   Chief Complaint: Positive blood culture  HPI: Hailey [name] is a 48 y.o. female with medical history significant for asthma, hypothyroidism,, ankylosing spondylitis, interstitial cystitis, arthritis status post right knee arthroplasty on 6/10 who presented to the ED due to positive blood culture. Patient reports that 2 days after her knee surgery, she started having chills and rigors described as shaking in the inside and outside.  She also reports associated nausea but no vomiting. She was evaluated in the ED on 6/13 and 6/14 and workup was overall unremarkable the patient was discharged directly from the ED.  A blood culture was obtained on 6/14 and one of the 2 bottles is growing gram-negative rods so the patient was called to present back to the ED. Patient endorsed generalized weakness and mild dysuria thought to be from her chronic interstitial cystitis but denies any fevers, shortness of breath, chest pain, hematuria, headache or dizziness.  ED Course: Initial vitals show temp 97.7, RR 17, HR 90, BP 140/98, SpO2 100% on room air. Initial labs significant for WBC 12.2, glucose 141, negative pregnancy test, lactic acid 2.0, UA with no signs of infection. Pt received IV Rocephin. TRH was consulted for admission.   Review of Systems: Please see HPI for pertinent positives and negatives. A complete 10 system review of systems are otherwise negative.  Past Medical History:  Diagnosis Date   Allergic state    Allergy    Ankylosing spondylitis (HCC)    followed by rheumatology Dr. Lydia Sams   Arthritis    Asthma    Cervical disc disease    Chest pain    Depression    Enlarged lymph nodes    GERD (gastroesophageal reflux disease)    H/O colonoscopy    HPV (human papilloma virus) infection    Hypothyroidism    Interstitial cystitis    LGSIL (low grade squamous intraepithelial  dysplasia)    Paresthesia    Pre-diabetes    Status post laparoscopic cholecystectomy 07/13/2016   Past Surgical History:  Procedure Laterality Date   BREAST BIOPSY Right 10/18/2015   bx/clip- neg   CHOLECYSTECTOMY N/A 07/07/2016   Procedure: LAPAROSCOPIC CHOLECYSTECTOMY;  Surgeon: Emmalene Hare, MD;  Location: [facility] ORS;  Service: General;  Laterality: N/A;   COLONOSCOPY WITH PROPOFOL  N/A 10/11/2015   Procedure: COLONOSCOPY WITH PROPOFOL ;  Surgeon: Deveron Fly, MD;  Location: [facility] ENDOSCOPY;  Service: Endoscopy;  Laterality: N/A;   ESOPHAGOGASTRODUODENOSCOPY (EGD) WITH PROPOFOL  N/A 10/11/2015   Procedure: ESOPHAGOGASTRODUODENOSCOPY (EGD) WITH PROPOFOL ;  Surgeon: Deveron Fly, MD;  Location: [facility] ENDOSCOPY;  Service: Endoscopy;  Laterality: N/A;   TONSILLECTOMY     TOTAL KNEE ARTHROPLASTY Left 01/08/2024   Procedure: LEFT TOTAL KNEE ARTHROPLASTYL;  Surgeon: Claiborne Crew, MD;  Location: [facility] ORS;  Service: Orthopedics;  Laterality: Left;   TOTAL KNEE ARTHROPLASTY Right 02/19/2024   Procedure: ARTHROPLASTY, KNEE, TOTAL;  Surgeon: Claiborne Crew, MD;  Location: [facility] ORS;  Service: Orthopedics;  Laterality: Right;   TOTAL THYROIDECTOMY  2005   goiter   Social History:  reports that she quit smoking about 22 years ago. Her smoking use included cigarettes. She started smoking about 29 years ago. She has a 3.5 pack-year smoking history. She has never been exposed to tobacco smoke. She has never used smokeless tobacco. She reports current alcohol use. She reports that she does not use drugs.  Allergies  Allergen Reactions  Levofloxacin Rash and Dermatitis   Lodine [Etodolac] Shortness Of Breath   Adalimumab      Unknown reaction    Ciprofloxacin Hives   Latex Rash    Family History  Problem Relation Age of Onset   Cancer Mother        Vulva cancer   Hypertension Mother    Stroke Mother    Goiter Mother    Diabetes Father    Hypertension Father    Hypertension Brother    Bladder  Cancer Maternal Aunt    Hypertension Maternal Grandmother    Stroke Maternal Grandmother    Diabetes Maternal Grandmother    Breast cancer Cousin 38       pat cousin     Prior to Admission medications   Medication Sig Start Date End Date Taking? Authorizing Provider  albuterol  (VENTOLIN  HFA) 108 (90 Base) MCG/ACT inhaler Inhale 2 puffs into the lungs every 6 (six) hours as needed for wheezing or shortness of breath. 10/12/23   Sheron Dixons, MD  aspirin  81 MG chewable tablet Chew 1 tablet (81 mg total) by mouth 2 (two) times daily. 02/18/24     celecoxib  (CELEBREX ) 200 MG capsule Take 1 capsule (200 mg total) by mouth 2 (two) times daily with a meal. 12/24/23     fluticasone -salmeterol (ADVAIR  DISKUS) 100-50 MCG/ACT AEPB Inhale 1 puff into the lungs 2 (two) times daily as needed. Patient taking differently: Inhale 1 puff into the lungs daily as needed (SOB). 10/12/23   Sheron Dixons, MD  methocarbamol  (ROBAXIN ) 500 MG tablet Take 1 tablet (500 mg total) by mouth every 6 (six) hours as needed. 12/24/23     metoCLOPramide  (REGLAN ) 10 MG tablet Take 1 tablet (10 mg total) by mouth every 6 (six) hours as needed for nausea. 02/22/24   Spence Dux, PA-C  Multiple Vitamin (MULTIVITAMIN WITH MINERALS) TABS tablet Take 1 tablet by mouth daily.    [provider]  nystatin  cream (MYCOSTATIN ) Apply 1 Application topically 2 (two) times daily as needed (rash).    [provider]  ondansetron  (ZOFRAN -ODT) 4 MG disintegrating tablet Take 1 tablet by mouth every 6 hours as needed for nausea/vomiting 02/18/24     oxyCODONE  (OXY IR/ROXICODONE ) 5 MG immediate release tablet Take 1 tablet (5 mg total) by mouth every 4 (four) hours as needed for severe pain. 02/18/24     polyethylene glycol powder (GLYCOLAX /MIRALAX ) 17 GM/SCOOP powder Mix 1 capful (17 grams) in liquid and drink by mouth 2 (two) times daily. 02/18/24     promethazine  (PHENERGAN ) 25 MG tablet Take 1 tablet (25 mg total) by mouth every  6 (six) hours as needed for nausea or vomiting. 02/23/24   Curatolo, Adam, DO  senna (SENOKOT) 8.6 MG tablet Take 2 tablets (17.2 mg total) by mouth at bedtime for 14 days. 02/18/24 03/03/24    SYNTHROID  200 MCG tablet Take 1 tablet (200 mcg total) by mouth once daily. Take on an empty stomach with a glass of water  at least 30-60 minutes before breakfast. 06/18/23     tiZANidine  (ZANAFLEX ) 4 MG tablet Take 1 tablet (4 mg total) by mouth at bedtime as needed for muscle spasm or pain 02/13/24       Physical Exam: BP 110/63   Pulse 91   Temp (!) 97.2 F (36.2 C)   Resp 17   LMP 11/27/2023   SpO2 99%  General: Pleasant, well-appearing middle-age obese woman laying in bed. No acute distress. HEENT: [place]/AT. Anicteric sclera CV:  RRR. No murmurs, rubs, or gallops. No LE edema Pulmonary: Lungs CTAB. Normal effort. No wheezing or rales. Abdominal: Soft, nontender, nondistended. Normal bowel sounds. MSK: Right knee surgical site covered with dressing mildly warm and tender to palpation.  Neurovascular intact distally. Left knee with healing surgical scar, no surrounding erythema.  Skin: Warm and dry. No obvious rash or lesions. Neuro: A&Ox3. Moves all extremities. Normal sensation to light touch. No focal deficit. Psych: Normal mood and affect          Labs on Admission:  Basic Metabolic Panel: Recent Labs  Lab 02/22/24 1637 02/23/24 0824  NA 137 137  K 3.8 3.7  CL 102 100  CO2 24 25  GLUCOSE 115* 117*  BUN 13 7  CREATININE 0.78 0.73  CALCIUM 9.1 9.4  MG 1.9  --    Liver Function Tests: Recent Labs  Lab 02/22/24 1637 02/23/24 0824  AST 17 16  ALT 15 15  ALKPHOS 120 100  BILITOT 0.6 0.8  PROT 7.4 6.9  ALBUMIN 3.8 3.5   No results for input(s): LIPASE, AMYLASE in the last 168 hours. No results for input(s): AMMONIA in the last 168 hours. CBC: Recent Labs  Lab 02/22/24 1637 02/23/24 0824 02/27/24 1500  WBC 11.7* 10.6* 12.2*  NEUTROABS 7.4 6.7 7.0  HGB 11.8* 11.8* 12.7   HCT 36.6 35.9* 38.5  MCV 87.8 86.9 87.7  PLT 459* 480* 239   Cardiac Enzymes: No results for input(s): CKTOTAL, CKMB, CKMBINDEX, TROPONINI in the last 168 hours. BNP (last 3 results) No results for input(s): BNP in the last 8760 hours.  ProBNP (last 3 results) No results for input(s): PROBNP in the last 8760 hours.  CBG: Recent Labs  Lab 02/22/24 1517 02/23/24 0801  GLUCAP 120* 115*    Radiological Exams on Admission: No results found. Assessment/Plan Hailey [name] is a 48 y.o. female with medical history significant for asthma, hypothyroidism,, ankylosing spondylitis, interstitial cystitis, arthritis s/p right knee arthroplasty on 6/10 who presented to the ED due to positive blood culture and admitted for bacteremia.   # Bacteremia - Patient with recent right knee surgery presented with intermittent rigors, nausea and generalized weakness - Blood culture from 6/14 shows gram-negative rods in 1 of 2 bottles - Patient afebrile, hemodynamically stable but with mild leukocytosis - Continue IV Rocephin - Follow-up repeat blood cultures - ID consult in the morning - Trend CBC, fever curve  # Knee arthritis s/p total knee arthroplasty - S/p total knee arthroplasty of the left knee on 4/29 and right knee on 6/10 - Left knee healing appropriately, right knee still covered with dressing, warmth and tender to touch - Pain control with as needed IV Dilaudid  and bowel regimen - Follow-up with orthopedic surgery in the outpatient  # Chronic interstitial cystitis - Recently treated for UTI - UA with no signs of acute infection - Pain control as above  # Asthma - Chronic and stable - Continue home bronchodilator  # Hypothyroidism - Continue Synthroid   # Ankylosing spondylitis - Continue Celebrex   # Class II obesity Body mass index is 39.73 kg/m. Filed Weights   02/27/24 2148  Weight: 108.3 kg  - F/u with PCP for weight lost and nutrition counseling   DVT  prophylaxis: Lovenox     Code Status: Full Code  Consults called: None  Family Communication: No family at bedside  Severity of Illness: The appropriate patient status for this patient is OBSERVATION. Observation status is judged to be reasonable and  necessary in order to provide the required intensity of service to ensure the patient's safety. The patient's presenting symptoms, physical exam findings, and initial radiographic and laboratory data in the context of their medical condition is felt to place them at decreased risk for further clinical deterioration. Furthermore, it is anticipated that the patient will be medically stable for discharge from the hospital within 2 midnights of admission.   Level of care: Med-Surg   This record has been created using Conservation officer, historic buildings. Errors have been sought and corrected, but may not always be located. Such creation errors do not reflect on the standard of care.   Vita Grip, MD 02/27/2024, 7:41 PM [facility] Pager: [phone] Isaiah 41:10   If 7PM-7AM, please contact night-coverage [email or web address] Password [email or web address]

## 2024-02-27 NOTE — Plan of Care (Signed)

## 2024-02-28 ENCOUNTER — Other Ambulatory Visit: Payer: Self-pay

## 2024-02-28 DIAGNOSIS — Z9049 Acquired absence of other specified parts of digestive tract: Secondary | ICD-10-CM | POA: Diagnosis not present

## 2024-02-28 DIAGNOSIS — Z87891 Personal history of nicotine dependence: Secondary | ICD-10-CM | POA: Diagnosis not present

## 2024-02-28 DIAGNOSIS — N3010 Interstitial cystitis (chronic) without hematuria: Secondary | ICD-10-CM | POA: Diagnosis not present

## 2024-02-28 DIAGNOSIS — Z79890 Hormone replacement therapy (postmenopausal): Secondary | ICD-10-CM | POA: Diagnosis not present

## 2024-02-28 DIAGNOSIS — Z803 Family history of malignant neoplasm of breast: Secondary | ICD-10-CM | POA: Diagnosis not present

## 2024-02-28 DIAGNOSIS — E66812 Obesity, class 2: Secondary | ICD-10-CM | POA: Diagnosis not present

## 2024-02-28 DIAGNOSIS — Z91040 Latex allergy status: Secondary | ICD-10-CM | POA: Diagnosis not present

## 2024-02-28 DIAGNOSIS — Z8249 Family history of ischemic heart disease and other diseases of the circulatory system: Secondary | ICD-10-CM | POA: Diagnosis not present

## 2024-02-28 DIAGNOSIS — Z823 Family history of stroke: Secondary | ICD-10-CM | POA: Diagnosis not present

## 2024-02-28 DIAGNOSIS — Z881 Allergy status to other antibiotic agents status: Secondary | ICD-10-CM | POA: Diagnosis not present

## 2024-02-28 DIAGNOSIS — Z833 Family history of diabetes mellitus: Secondary | ICD-10-CM | POA: Diagnosis not present

## 2024-02-28 DIAGNOSIS — Z96651 Presence of right artificial knee joint: Secondary | ICD-10-CM | POA: Diagnosis not present

## 2024-02-28 DIAGNOSIS — Z7951 Long term (current) use of inhaled steroids: Secondary | ICD-10-CM | POA: Diagnosis not present

## 2024-02-28 DIAGNOSIS — Z7982 Long term (current) use of aspirin: Secondary | ICD-10-CM | POA: Diagnosis not present

## 2024-02-28 DIAGNOSIS — Z87440 Personal history of urinary (tract) infections: Secondary | ICD-10-CM | POA: Diagnosis not present

## 2024-02-28 DIAGNOSIS — M7989 Other specified soft tissue disorders: Secondary | ICD-10-CM | POA: Diagnosis not present

## 2024-02-28 DIAGNOSIS — R7881 Bacteremia: Secondary | ICD-10-CM | POA: Diagnosis not present

## 2024-02-28 DIAGNOSIS — M459 Ankylosing spondylitis of unspecified sites in spine: Secondary | ICD-10-CM | POA: Diagnosis not present

## 2024-02-28 DIAGNOSIS — Z96653 Presence of artificial knee joint, bilateral: Secondary | ICD-10-CM | POA: Diagnosis present

## 2024-02-28 DIAGNOSIS — Z6839 Body mass index (BMI) 39.0-39.9, adult: Secondary | ICD-10-CM | POA: Diagnosis not present

## 2024-02-28 DIAGNOSIS — J45909 Unspecified asthma, uncomplicated: Secondary | ICD-10-CM | POA: Diagnosis not present

## 2024-02-28 DIAGNOSIS — E890 Postprocedural hypothyroidism: Secondary | ICD-10-CM | POA: Diagnosis not present

## 2024-02-28 DIAGNOSIS — Z8052 Family history of malignant neoplasm of bladder: Secondary | ICD-10-CM | POA: Diagnosis not present

## 2024-02-28 LAB — CULTURE, BLOOD (ROUTINE X 2): Culture: NO GROWTH

## 2024-02-28 LAB — CBC
HCT: 34.3 % — ABNORMAL LOW (ref 36.0–46.0)
Hemoglobin: 10.8 g/dL — ABNORMAL LOW (ref 12.0–15.0)
MCH: 28.9 pg (ref 26.0–34.0)
MCHC: 31.5 g/dL (ref 30.0–36.0)
MCV: 91.7 fL (ref 80.0–100.0)
Platelets: 432 10*3/uL — ABNORMAL HIGH (ref 150–400)
RBC: 3.74 MIL/uL — ABNORMAL LOW (ref 3.87–5.11)
RDW: 14.6 % (ref 11.5–15.5)
WBC: 12.3 10*3/uL — ABNORMAL HIGH (ref 4.0–10.5)
nRBC: 0 % (ref 0.0–0.2)

## 2024-02-28 LAB — BASIC METABOLIC PANEL WITH GFR
Anion gap: 6 (ref 5–15)
BUN: 19 mg/dL (ref 6–20)
CO2: 25 mmol/L (ref 22–32)
Calcium: 8.5 mg/dL — ABNORMAL LOW (ref 8.9–10.3)
Chloride: 102 mmol/L (ref 98–111)
Creatinine, Ser: 0.57 mg/dL (ref 0.44–1.00)
GFR, Estimated: >60 mL/min (ref >60)
Glucose, Bld: 113 mg/dL — ABNORMAL HIGH (ref 70–99)
Potassium: 3.9 mmol/L (ref 3.5–5.1)
Sodium: 133 mmol/L — ABNORMAL LOW (ref 135–145)

## 2024-02-28 LAB — LACTIC ACID, PLASMA: Lactic Acid, Venous: 1.1 mmol/L (ref 0.5–1.9)

## 2024-02-28 MED ORDER — DOCUSATE SODIUM 100 MG PO CAPS
100.0000 mg | ORAL_CAPSULE | Freq: Every day | ORAL | Status: DC
  Administered 2024-02-28 – 2024-03-01 (×2): 100 mg via ORAL
  Filled 2024-02-28 (×3): qty 1

## 2024-02-28 MED ORDER — CLOTRIMAZOLE 1 % VA CREA
1.0000 | TOPICAL_CREAM | Freq: Every day | VAGINAL | Status: DC
  Administered 2024-02-28 – 2024-02-29 (×2): 1 via VAGINAL
  Filled 2024-02-28: qty 45

## 2024-02-28 MED ORDER — HYDROMORPHONE HCL 2 MG PO TABS
2.0000 mg | ORAL_TABLET | ORAL | Status: DC | PRN
  Administered 2024-02-28: 2 mg via ORAL
  Administered 2024-02-28: 4 mg via ORAL
  Administered 2024-02-29 – 2024-03-01 (×4): 2 mg via ORAL
  Filled 2024-02-28: qty 2
  Filled 2024-02-28 (×2): qty 1
  Filled 2024-02-28 (×2): qty 2
  Filled 2024-02-28 (×2): qty 1

## 2024-02-28 MED ORDER — HYDROMORPHONE HCL 1 MG/ML IJ SOLN: 0.5000 mg | INTRAMUSCULAR | Status: DC | PRN

## 2024-02-28 NOTE — Progress Notes (Addendum)
 Subjective:    Patient reports pain as mild.   Patient seen in rounds for Dr. Bernard Brick.  Patient admitted overnight due to positive blood culture finding from ER visit last week. She tells me she is feeling better in general. She does continue to have lower abdominal/bladder pain. She has a known diagnosis of interstitial cystitis and tells me that her urologist was planning an outpatient cystoscopy due to recurrent UTIs recently. Her knees are feeling okay.   We will start therapy today.   Objective: Vital signs in last 24 hours: Temp:  [97.2 F (36.2 C)-97.8 F (36.6 C)] 97.8 F (36.6 C) (06/19 0534) Pulse Rate:  [69-91] 69 (06/19 0534) Resp:  [16-18] 16 (06/19 0534) BP: (110-140)/(57-100) 120/78 (06/19 0534) SpO2:  [98 %-100 %] 100 % (06/19 0534) Weight:  [108.3 kg] 108.3 kg (06/18 2148)  Intake/Output from previous day:  Intake/Output Summary (Last 24 hours) at 02/28/2024 0841 Last data filed at 02/28/2024 0600 Gross per 24 hour  Intake 480 ml  Output --  Net 480 ml     Intake/Output this shift: No intake/output data recorded.  Labs: Recent Labs    02/27/24 1500 02/28/24 0356  HGB 12.7 10.8*   Recent Labs    02/27/24 1500 02/28/24 0356  WBC 12.2* 12.3*  RBC 4.39 3.74*  HCT 38.5 34.3*  PLT 239 432*   Recent Labs    02/27/24 2131 02/28/24 0356  NA 137 133*  K 3.8 3.9  CL 103 102  CO2 20* 25  BUN 19 19  CREATININE 0.70 0.57  GLUCOSE 141* 113*  CALCIUM 9.1 8.5*   No results for input(s): LABPT, INR in the last 72 hours.  Exam: General - Patient is Alert and Oriented Extremity -  Left Knee: Well healed arthroplasty scar No effusion, no signs of infection AROM 0-120  Dressing -  Right knee dressing removed. Incision is healing well. No signs of infection. Re-dressed with mepilex  Motor Function - intact, moving foot and toes well on exam.   Past Medical History:  Diagnosis Date   Allergic state    Allergy    Ankylosing spondylitis  (HCC)    followed by rheumatology Dr. Lydia Sams   Arthritis    Asthma    Cervical disc disease    Chest pain    Depression    Enlarged lymph nodes    GERD (gastroesophageal reflux disease)    H/O colonoscopy    HPV (human papilloma virus) infection    Hypothyroidism    Interstitial cystitis    LGSIL (low grade squamous intraepithelial dysplasia)    Paresthesia    Pre-diabetes    Status post laparoscopic cholecystectomy 07/13/2016    Assessment/Plan:    Principal Problem:   Bacteremia  Estimated body mass index is 39.73 kg/m as calculated from the following:   Height as of this encounter: 5' 5 (1.651 m).   Weight as of this encounter: 108.3 kg.   Appreciate the assistance of the hospitalist service as well as infectious disease Repeat blood cultures taken yesterday  Awaiting ID consult today  Fortunately there are no signs of infection in either knee No indication for knee aspiration or advanced imaging  She is a patient of [facility] in [facility] [place] I attempted to secure chat her, but she is offline until next week Could consult urology vs outpatient follow up She did request something for bladder pain / spasm which I will defer to primary team  Up  with PT while here  Recommend asa 81 mg BID for DVT ppx unless medical service has other concerns Re-started her home pain medication regimen  Will continue to follow    Kim Pen, PA-C Orthopedic Surgery [phone] 02/28/2024, 8:41 AM

## 2024-02-28 NOTE — Evaluation (Signed)
 Physical Therapy Evaluation Patient Details Name: Hailey [name] MRN: [id number] DOB: Jun 26, 1976 Today's Date: 02/28/2024  History of Present Illness  48 yo  female admitted with  positive blood cultures, RTKA 02/19/24, PMH: LTKA, ankylosing spondylitis, polyrthalagia,GERD,dyslipidemia,lymphadenopathy, OA, interstitial cystitis, recurrent UTI's  Clinical Impression  Pt admitted with above diagnosis.  Pt currently with functional limitations due to the deficits listed below (see PT Problem List). Pt will benefit from acute skilled PT to increase their independence and safety with mobility to allow discharge.       The patient  is 9 days out from [place]. Patient reports noted right knee hyper flexes back( sounds like hyperextends) at [facility] Julia Oats. Patient has not been able to attend OPPT visits as prescribed at DC due to illness ? Related to bacteremia.  Patient performed ROM and strengthening exercises, tolerated well. Patient will progress to return home.    If plan is discharge home, recommend the following: Assistance with cooking/housework;Assist for transportation;Help with stairs or ramp for entrance   Can travel by private vehicle        Equipment Recommendations None recommended by PT  Recommendations for Other Services       Functional Status Assessment Patient has had a recent decline in their functional status and demonstrates the ability to make significant improvements in function in a reasonable and predictable amount of time.     Precautions / Restrictions Precautions Precautions: Knee Restrictions RLE Weight Bearing Per Provider Order: Weight bearing as tolerated LLE Weight Bearing Per Provider Order: Weight bearing as tolerated      Mobility  Bed Mobility Overal bed mobility: Independent                  Transfers Overall transfer level: Modified independent   Transfers: Sit to/from Stand Sit to Stand: Modified independent (Device/Increase time)                 Ambulation/Gait Ambulation/Gait assistance: Supervision Gait Distance (Feet): 150 Feet Assistive device: Rolling walker (2 wheels) Gait Pattern/deviations: Step-through pattern Gait velocity: decreased but functional     General Gait Details: reports intermittent R knee hyperextneds-snaps back  Stairs            Wheelchair Mobility     Tilt Bed    Modified Rankin (Stroke Patients Only)       Balance Overall balance assessment: No apparent balance deficits (not formally assessed)                                           Pertinent Vitals/Pain Pain Assessment Pain Score: 5  Pain Location: R knee Pain Descriptors / Indicators: Aching, Tightness Pain Intervention(s): Monitored during session, Premedicated before session, Ice applied    Home Living Family/patient expects to be discharged to:: Private residence Living Arrangements: Spouse/significant other;Children Available Help at Discharge: Family;Available 24 hours/day Type of Home: House Home Access: Level entry       Home Layout: One level Home Equipment: Grab bars - tub/shower;Shower seat - built Charity fundraiser (2 wheels);Cane - single point Additional Comments: Reports home handicapped accessible    Prior Function Prior Level of Function : Independent/Modified Independent             Mobility Comments: using RW ADLs Comments: assist  for safety with ADL's     Extremity/Trunk Assessment   Upper Extremity Assessment Upper Extremity Assessment: Overall [facility]  for tasks assessed    Lower Extremity Assessment Lower Extremity Assessment: LLE deficits/detail;RLE deficits/detail RLE Deficits / Details: Recent L TKA (~6 weeks prior); ROM WFL; MMT : ankle 5/5, knee ext 4+/5, hip 5/5 RLE Sensation: WNL LLE Deficits / Details: knee flex10-65. + SLR LLE Sensation: WNL    Cervical / Trunk Assessment Cervical / Trunk Assessment: Normal  Communication    Communication Communication: No apparent difficulties    Cognition Arousal: Alert Behavior During Therapy: WFL for tasks assessed/performed   PT - Cognitive impairments: No apparent impairments                         Following commands: Intact       Cueing       General Comments      Exercises Total Joint Exercises Quad Sets: AROM, Both, 10 reps, Supine Heel Slides: 10 reps, Supine, Right, AAROM Straight Leg Raises: AAROM, Right, 10 reps, Supine Long Arc Quad: AROM, 10 reps, Seated, Right Knee Flexion: AROM, 10 reps, Seated, Right, Strengthening Other Exercises Other Exercises: yellow TB resitive R knee flexion seated, Isometric knee flex seated x 10   Assessment/Plan    PT Assessment Patient needs continued PT services  PT Problem List Decreased strength;Pain;Decreased range of motion;Decreased activity tolerance;Decreased knowledge of use of DME;Decreased balance;Decreased mobility       PT Treatment Interventions DME instruction;Therapeutic exercise;Gait training;Balance training;Stair training;Functional mobility training;Therapeutic activities;Patient/family education;Modalities    PT Goals (Current goals can be found in the Care Plan section)  Acute Rehab PT Goals Patient Stated Goal: return home PT Goal Formulation: With patient Time For Goal Achievement: 03/06/24 Potential to Achieve Goals: Good    Frequency Min 5X/week     Co-evaluation               AM-PAC PT 6 Clicks Mobility  Outcome Measure Help needed turning from your back to your side while in a flat bed without using bedrails?: None Help needed moving from lying on your back to sitting on the side of a flat bed without using bedrails?: None Help needed moving to and from a bed to a chair (including a wheelchair)?: None Help needed standing up from a chair using your arms (e.g., wheelchair or bedside chair)?: None Help needed to walk in hospital room?: None Help needed  climbing 3-5 steps with a railing? : A Little 6 Click Score: 23    End of Session   Activity Tolerance: Patient tolerated treatment well Patient left: in bed;with call bell/phone within reach Nurse Communication: Mobility status PT Visit Diagnosis: Other abnormalities of gait and mobility (R26.89);Muscle weakness (generalized) (M62.81)    Time: 0454-0981 PT Time Calculation (min) (ACUTE ONLY): 27 min   Charges:   PT Evaluation $PT Eval Low Complexity: 1 Low PT Treatments $Gait Training: 8-22 mins PT General Charges $$ ACUTE PT VISIT: 1 Visit         Abelina Hoes PT Acute Rehabilitation Services Office [phone]   Dareen Ebbing 02/28/2024, 2:49 PM

## 2024-02-28 NOTE — TOC Initial Note (Signed)
 Transition of Care [facility]) - Initial/Assessment Note    Patient Details  Name: Hailey [name] MRN: [id number] Date of Birth: 1975/09/30  Transition of Care [facility]) CM/SW Contact:    Bari Leys, RN Phone Number: 02/28/2024, 10:09 AM  Clinical Narrative:   Met with patient at bedside to introduce role of TOC/NCM and review for dc planning, pt confirmed she has an established PCP, s/p TKA  a few weeks ago, was receiving OPPT, has  RW. PT eval, await recommendation. TOC will continue to follow.                 Expected Discharge Plan: Home/Self Care Barriers to Discharge: Continued Medical Work up   Patient Goals and CMS Choice Patient states their goals for this hospitalization and ongoing recovery are:: return home          Expected Discharge Plan and Services       Living arrangements for the past 2 months: Single Family Home                                      Prior Living Arrangements/Services Living arrangements for the past 2 months: Single Family Home Lives with:: Spouse Patient language and need for interpreter reviewed:: Yes Do you feel safe going back to the place where you live?: Yes      Need for Family Participation in Patient Care: Yes (Comment) Care giver support system in place?: Yes (comment) Current home services: DME (RW) Criminal Activity/Legal Involvement Pertinent to Current Situation/Hospitalization: No - Comment as needed  Activities of Daily Living   ADL Screening (condition at time of admission) Independently performs ADLs?: Yes (appropriate for developmental age) Is the patient deaf or have difficulty hearing?: No Does the patient have difficulty seeing, even when wearing glasses/contacts?: No Does the patient have difficulty concentrating, remembering, or making decisions?: No  Permission Sought/Granted                  Emotional Assessment Appearance:: Appears stated age Attitude/Demeanor/Rapport: Engaged Affect  (typically observed): Accepting Orientation: : Oriented to Self, Oriented to Place, Oriented to  Time, Oriented to Situation Alcohol / Substance Use: Not Applicable Psych Involvement: No (comment)  Admission diagnosis:  Bacteremia [R78.81] Patient Active Problem List   Diagnosis Date Noted   Bacteremia 02/27/2024   S/P total knee arthroplasty, right 02/19/2024   S/P total knee arthroplasty, left 01/08/2024   S/P total knee arthroplasty 01/08/2024   Atypical chest pain 11/23/2023   Preop cardiovascular exam 11/23/2023   Dyslipidemia 11/23/2023   Generalized lymphadenopathy 09/23/2021   B12 deficiency 09/23/2021   Fatigue 09/23/2021   Primary osteoarthritis of both knees 04/29/2021   Ankylosing spondylitis (HCC) 12/30/2018   Asthma in adult without complication 12/30/2018   Chronic diarrhea 12/30/2018   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   Interstitial cystitis 07/03/2016   Pelvic pain in female 07/03/2016   Chondromalacia of knee, 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   BMI 38.0-38.9,adult 02/14/2016   Polyarthralgia 02/14/2016   GERD (gastroesophageal reflux disease) 02/10/2014   PCP:  Sheron Dixons, MD Pharmacy:   [facility] - [facility] [address] [place] [address] Phone: [phone] Fax: [phone]  [facility] [address] [place] [address] Phone: [phone] Fax: [phone]  [facility] [address] [place] - [address] [place] [address] Phone: [phone]  Fax: [phone]  [id number] [place], [place] - [address] AT [facility] OF [address] [place] [address] [place] [address] Phone: [phone] Fax: [phone]  [facility][id number] [facility], [place] - [address] [facility] [address] [facility] [address] [facility] [address] AT [facility] [address] [place] [address] Phone: [phone] Fax: [phone]  [id number] [place], [place] - [address] [place] [address] Phone: [phone] Fax: [phone]     Social Drivers of Health (SDOH) Social History: SDOH Screenings   Food Insecurity: No Food Insecurity (02/27/2024)  Housing: Low Risk  (02/27/2024)  Transportation Needs: No Transportation Needs (02/27/2024)  Utilities: Not At Risk (02/27/2024)  Depression (PHQ2-9): Medium Risk (04/04/2022)  Tobacco Use: Medium Risk (02/27/2024)   SDOH Interventions:     Readmission Risk Interventions     No data to display

## 2024-02-28 NOTE — Progress Notes (Signed)
 [facility]  PROGRESS NOTE  Hailey [name]:[id number] DOB: Jan 14, 1976 DOA: 02/27/2024 PCP: Sheron Dixons, MD   Brief HPI:    48 y.o. female with medical history significant for asthma, hypothyroidism,, ankylosing spondylitis, interstitial cystitis, arthritis status post right knee arthroplasty on 6/10 who presented to the ED due to positive blood culture.  Patient reports that 2 days after her knee surgery, she started having chills and rigors described as shaking in the inside and outside.  She also reports associated nausea but no vomiting. She was evaluated in the ED on 6/13 and 6/14 and workup was overall unremarkable the patient was discharged directly from the ED.  A blood culture was obtained on 6/14 and one of the 2 bottles is growing gram-negative rods so the patient was called to present back to the ED    Assessment/Plan:    # Bacteremia - Patient with recent right knee surgery presented with intermittent rigors, nausea and generalized weakness - Blood culture from 6/14 shows gram-negative rods in 1 of 2 bottles; blood culture growing Moraxella, final result pending - Patient afebrile, hemodynamically stable but with mild leukocytosis - Continue IV Rocephin -Will consult infectious disease for further recommendations   # Knee arthritis s/p total knee arthroplasty - S/p total knee arthroplasty of the left knee on 4/29 and right knee on 6/10 - Left knee healing appropriately, right knee still covered with dressing, warmth and tender to touch - Pain control with as needed IV Dilaudid  and bowel regimen - Orthopedic surgery following   # Chronic interstitial cystitis - Recently treated for UTI - UA with no signs of acute infection - Pain control as above   # Asthma - Chronic and stable - Continue home bronchodilator   # Hypothyroidism - Continue Synthroid    # Ankylosing spondylitis - Continue Celebrex    # Class II obesity Body mass index is 39.73  kg/m.   Medications     aspirin   81 mg Oral BID   celecoxib   200 mg Oral BID WC   enoxaparin (LOVENOX) injection  40 mg Subcutaneous Q24H   levothyroxine   200 mcg Oral Q0600   polyethylene glycol  17 g Oral BID   senna  2 tablet Oral QHS     Data Reviewed:   CBG:  Recent Labs  Lab 02/22/24 1517 02/23/24 0801  GLUCAP 120* 115*    SpO2: 100 %    Vitals:   02/27/24 2125 02/27/24 2148 02/28/24 0227 02/28/24 0534  BP: 125/83  (!) 126/57 120/78  Pulse: 88  71 69  Resp: 16  16 16   Temp: (!) 97.4 F (36.3 C)  97.8 F (36.6 C) 97.8 F (36.6 C)  TempSrc: Oral  Oral Oral  SpO2: 100%  100% 100%  Weight:  108.3 kg    Height:  5' 5 (1.651 m)        Data Reviewed:  Basic Metabolic Panel: Recent Labs  Lab 02/22/24 1637 02/23/24 0824 02/27/24 2131 02/28/24 0356  NA 137 137 137 133*  K 3.8 3.7 3.8 3.9  CL 102 100 103 102  CO2 24 25 20* 25  GLUCOSE 115* 117* 141* 113*  BUN 13 7 19 19   CREATININE 0.78 0.73 0.70 0.57  CALCIUM 9.1 9.4 9.1 8.5*  MG 1.9  --   --   --     CBC: Recent Labs  Lab 02/22/24 1637 02/23/24 0824 02/27/24 1500 02/28/24 0356  WBC 11.7* 10.6* 12.2* 12.3*  NEUTROABS 7.4 6.7 7.0  --  HGB 11.8* 11.8* 12.7 10.8*  HCT 36.6 35.9* 38.5 34.3*  MCV 87.8 86.9 87.7 91.7  PLT 459* 480* 239 432*    LFT Recent Labs  Lab 02/22/24 1637 02/23/24 0824 02/27/24 2131  AST 17 16 17   ALT 15 15 15   ALKPHOS 120 100 104  BILITOT 0.6 0.8 0.7  PROT 7.4 6.9 7.5  ALBUMIN 3.8 3.5 3.8     Antibiotics: Anti-infectives (From admission, onward)    Start     Dose/Rate Route Frequency Ordered Stop   02/28/24 1800  cefTRIAXone (ROCEPHIN) 2 g in sodium chloride  0.9 % 100 mL IVPB        2 g 200 mL/hr over 30 Minutes Intravenous Every 24 hours 02/27/24 2259     02/28/24 1700  cefTRIAXone (ROCEPHIN) 1 g in sodium chloride  0.9 % 100 mL IVPB  Status:  Discontinued        1 g 200 mL/hr over 30 Minutes Intravenous Every 24 hours 02/27/24 2258 02/27/24 2259    02/27/24 1745  cefTRIAXone (ROCEPHIN) 2 g in sodium chloride  0.9 % 100 mL IVPB        2 g 200 mL/hr over 30 Minutes Intravenous  Once 02/27/24 1743 02/27/24 1903        DVT prophylaxis: Aspirin  81 mg twice daily  Code Status: Full code  Family Communication: No family at bedside   CONSULTS    Subjective   Complains of abdominal pain, no dysuria.   Objective    Physical Examination:   General-appears in no acute distress Heart-S1-S2, regular, no murmur auscultated Lungs-clear to auscultation bilaterally, no wheezing or crackles auscultated Abdomen-soft, mild generalized tenderness to palpation, no rigidity or guarding , no organomegaly Extremities-right knee in dressing and knee immobilizer Neuro-alert, oriented x3, no focal deficit noted   Status is: Inpatient:             Hailey [name]   [facility] If 7PM-7AM, please contact night-coverage at [email or web address], Office  [phone]   02/28/2024, 7:55 AM  LOS: 0 days

## 2024-02-29 ENCOUNTER — Inpatient Hospital Stay (HOSPITAL_COMMUNITY)

## 2024-02-29 DIAGNOSIS — R7881 Bacteremia: Secondary | ICD-10-CM | POA: Diagnosis not present

## 2024-02-29 LAB — CBC
HCT: 34.7 % — ABNORMAL LOW (ref 36.0–46.0)
Hemoglobin: 11.1 g/dL — ABNORMAL LOW (ref 12.0–15.0)
MCH: 28.4 pg (ref 26.0–34.0)
MCHC: 32 g/dL (ref 30.0–36.0)
MCV: 88.7 fL (ref 80.0–100.0)
Platelets: 466 10*3/uL — ABNORMAL HIGH (ref 150–400)
RBC: 3.91 MIL/uL (ref 3.87–5.11)
RDW: 14.6 % (ref 11.5–15.5)
WBC: 11.1 10*3/uL — ABNORMAL HIGH (ref 4.0–10.5)
nRBC: 0 % (ref 0.0–0.2)

## 2024-02-29 LAB — COMPREHENSIVE METABOLIC PANEL WITH GFR
ALT: 19 U/L (ref 0–44)
AST: 22 U/L (ref 15–41)
Albumin: 3.7 g/dL (ref 3.5–5.0)
Alkaline Phosphatase: 100 U/L (ref 38–126)
Anion gap: 10 (ref 5–15)
BUN: 14 mg/dL (ref 6–20)
CO2: 26 mmol/L (ref 22–32)
Calcium: 9.3 mg/dL (ref 8.9–10.3)
Chloride: 100 mmol/L (ref 98–111)
Creatinine, Ser: 0.6 mg/dL (ref 0.44–1.00)
GFR, Estimated: >60 mL/min (ref >60)
Glucose, Bld: 105 mg/dL — ABNORMAL HIGH (ref 70–99)
Potassium: 4.5 mmol/L (ref 3.5–5.1)
Sodium: 136 mmol/L (ref 135–145)
Total Bilirubin: 1.1 mg/dL (ref 0.0–1.2)
Total Protein: 7.4 g/dL (ref 6.5–8.1)

## 2024-02-29 LAB — CULTURE, BLOOD (ROUTINE X 2): Special Requests: ADEQUATE

## 2024-02-29 NOTE — Plan of Care (Signed)
   Problem: Education: Goal: Knowledge of General Education information will improve Description: Including pain rating scale, medication(s)/side effects and non-pharmacologic comfort measures Outcome: Progressing   Problem: Clinical Measurements: Goal: Ability to maintain clinical measurements within normal limits will improve Outcome: Progressing   Problem: Activity: Goal: Risk for activity intolerance will decrease Outcome: Progressing   Problem: Coping: Goal: Level of anxiety will decrease Outcome: Progressing   Problem: Pain Managment: Goal: General experience of comfort will improve and/or be controlled Outcome: Progressing

## 2024-02-29 NOTE — Plan of Care (Signed)
  Problem: Education: Goal: Knowledge of General Education information will improve Description: Including pain rating scale, medication(s)/side effects and non-pharmacologic comfort measures Outcome: Progressing   Problem: Health Behavior/Discharge Planning: Goal: Ability to manage health-related needs will improve Outcome: Adequate for Discharge   Problem: Clinical Measurements: Goal: Ability to maintain clinical measurements within normal limits will improve Outcome: Progressing Goal: Will remain free from infection Outcome: Progressing Goal: Diagnostic test results will improve Outcome: Progressing Goal: Respiratory complications will improve Outcome: Progressing Goal: Cardiovascular complication will be avoided Outcome: Progressing   Problem: Activity: Goal: Risk for activity intolerance will decrease Outcome: Adequate for Discharge   Problem: Nutrition: Goal: Adequate nutrition will be maintained Outcome: Progressing   Problem: Coping: Goal: Level of anxiety will decrease Outcome: Progressing   Problem: Elimination: Goal: Will not experience complications related to bowel motility Outcome: Completed/Met Goal: Will not experience complications related to urinary retention Outcome: Completed/Met   Problem: Pain Managment: Goal: General experience of comfort will improve and/or be controlled Outcome: Progressing   Problem: Safety: Goal: Ability to remain free from injury will improve Outcome: Progressing   Problem: Skin Integrity: Goal: Risk for impaired skin integrity will decrease Outcome: Progressing

## 2024-02-29 NOTE — Progress Notes (Signed)
 Physical Therapy Treatment Patient Details Name: Hailey [name] MRN: [id number] DOB: 03-12-1976 Today's Date: 02/29/2024   History of Present Illness 48 yo  female admitted with  positive blood cultures, RTKA 02/19/24, PMH: LTKA, ankylosing spondylitis, polyrthalagia,GERD,dyslipidemia,lymphadenopathy, OA, interstitial cystitis, recurrent UTI's    PT Comments  Pt received resting in bed, upset about current readmission yet trying to stay positive. Pt continues to demonstrate ModI for bed mobility and transfers. Gait training in hallway with ModI/distant supervision, step through continuous gait, no hyper-extension signs at Right knee. Pt given HEP to complete Independently during acute stay and encouraged to ambulate with RW in hallway several times a day to maintain current LOF. Continue PT per POC.    If plan is discharge home, recommend the following: Assistance with cooking/housework;Assist for transportation;Help with stairs or ramp for entrance   Can travel by private vehicle        Equipment Recommendations  None recommended by PT    Recommendations for Other Services       Precautions / Restrictions Precautions Precautions: Knee Precaution Booklet Issued: Yes (comment) (Pt issued another TKR packet to complete independently acutely) Recall of Precautions/Restrictions: Intact Restrictions Weight Bearing Restrictions Per Provider Order: Yes RLE Weight Bearing Per Provider Order: Weight bearing as tolerated     Mobility  Bed Mobility Overal bed mobility: Modified Independent Bed Mobility: Supine to Sit, Sit to Supine     Supine to sit: Modified independent (Device/Increase time) Sit to supine: Modified independent (Device/Increase time)        Transfers Overall transfer level: Modified independent Equipment used: Rolling walker (2 wheels) Transfers: Sit to/from Stand Sit to Stand: Modified independent (Device/Increase time)           General transfer comment:  good recall of hand placement    Ambulation/Gait Ambulation/Gait assistance: Modified independent (Device/Increase time), Supervision Gait Distance (Feet): 300 Feet Assistive device: Rolling walker (2 wheels) Gait Pattern/deviations: Step-through pattern Gait velocity: decreased but functional     General Gait Details: reports intermittent R knee hyperextneds-snaps back, note noted on today's visit   Stairs             Wheelchair Mobility     Tilt Bed    Modified Rankin (Stroke Patients Only)       Balance Overall balance assessment: No apparent balance deficits (not formally assessed) Sitting-balance support: No upper extremity supported Sitting balance-Hailey [name]: Good     Standing balance support: Bilateral upper extremity supported, Reliant on assistive device for balance, No upper extremity supported Standing balance-Hailey [name]: Fair Standing balance comment: RW to ambulate but could stand without UE support                            Communication Communication Communication: No apparent difficulties  Cognition Arousal: Alert Behavior During Therapy: WFL for tasks assessed/performed   PT - Cognitive impairments: No apparent impairments                         Following commands: Intact      Cueing Cueing Techniques: Verbal cues  Exercises Total Joint Exercises Ankle Circles/Pumps: AROM, Both, 10 reps, Supine Quad Sets: AROM, Both, 10 reps, Supine Other Exercises Other Exercises: yellow TB resitive R knee flexion seated, Isometric knee flex seated x 10, pt given HEP to cmplete independently with good understanding    General Comments General comments (skin integrity, edema, etc.): Vertical dry dressing across  Right knee intact      Pertinent Vitals/Pain Pain Assessment Pain Assessment: 0-10 Pain Score: 5  Pain Location: R knee Pain Descriptors / Indicators: Aching, Tightness Pain Intervention(s): Monitored during  session    Home Living                          Prior Function            PT Goals (current goals can now be found in the care plan section) Acute Rehab PT Goals Patient Stated Goal: return home Progress towards PT goals: Progressing toward goals    Frequency    Min 5X/week      PT Plan      Co-evaluation              AM-PAC PT 6 Clicks Mobility   Outcome Measure  Help needed turning from your back to your side while in a flat bed without using bedrails?: None Help needed moving from lying on your back to sitting on the side of a flat bed without using bedrails?: None Help needed moving to and from a bed to a chair (including a wheelchair)?: None Help needed standing up from a chair using your arms (e.g., wheelchair or bedside chair)?: None Help needed to walk in hospital room?: None Help needed climbing 3-5 steps with a railing? : A Little 6 Click Score: 23    End of Session   Activity Tolerance: Patient tolerated treatment well Patient left: in bed;with call bell/phone within reach Nurse Communication: Mobility status PT Visit Diagnosis: Other abnormalities of gait and mobility (R26.89);Muscle weakness (generalized) (M62.81)     Time: 6962-9528 PT Time Calculation (min) (ACUTE ONLY): 23 min  Charges:    $Gait Training: 8-22 mins $Therapeutic Exercise: 8-22 mins PT General Charges $$ ACUTE PT VISIT: 1 Visit                    Melvyn Stagers, PTA  [name] 02/29/2024, 11:55 AM

## 2024-02-29 NOTE — Consult Note (Signed)
 [facility]    Date of Admission:  02/27/2024   Total days of inpatient antibiotics 2        Reason for Consult: Bacteremia    Principal Problem:   Bacteremia   Assessment: 48 year old female with past medical history of recent right knee total arthroplasty on 6/10, left knee arthroplasty in 4/29, asthma, hypothyroidism, ankylosing spondylitis, interstitial cystitis followed by urology presented with positive blood cultures found to have #Marcella species bacteremia - Patient following knee arthroplasty on 6/10 developed some abdominal pain.  She does have chronic issues with her cystitis followed by urology.  She states that she had dysuria and abdominal pain that was worse than normal.  She was given Macrobid  for a couple of days which did not improve her symptoms.  - I reviewed UA from 6/13 6/14 and 6/18.  All are benign.  All were negative like leukocytes and negative nitrates.  Of note she recived macrobid  on discharg on 6/11 which she partially took as such ua can be beign.  - R knee does seem c/f infection. Seen by ortho as well, no signs of infection noted Recommendations:  -D/C ceftriaxone. Start augmentin  875-125mg  to complete 10 days of abx eot 6/27. I sent in for MIC for cipro, augmentin , amox, and ceftriaxone -Right knee xray, unless abnormal no plans for advanced imaging given recent surgery may not offer a clear picture. In addition, per ortho eval no concern for infectoin noted on exam. On my exam no erythema  noted, no tightness at knee -I suspect source of bacteremia likely urinary in source especially given hs of abx exposure. Pt also has a Hx of interstitial cystitis followed by urology -I think diarrhea is likely due to pt coninued to be on mira lax with abx. Gi translocation could be a source but highly unlikely -Relayed to primary -ID will sign off   Evaluation of this patient requires complex antimicrobial therapy evaluation and  counseling + isolation needs for disease transmission risk assessment and mitigation   Microbiology:   Antibiotics: Ceftriaxone 6/18-  Cultures: Blood 6/14 1/4 morexella species    HPI: Hailey [name] is a 48 y.o. female with past medical history of asthma, hypothyroidism, ankylosing spondylitis, history of social cystitis, arthritis status post right knee arthroplasty on 6/10 presented with positive blood cultures to the ED.  Patient reports she is having chills, rigors.  She had presented to ED on 6/30 and 14 with an unremarkable workup.  Blood cultures from 6/14 grew 1/4 bottles Moraxella.  She had undergone total knee arthroplasty on the left on 4/29 and most recently on the right on 6/10.  She did note some abdominal pain that was worse.  She was given Macrobid  recently following knee surgery for a couple of days.  No complaints at knees incision.  Seen by orthopedics.   Review of Systems: Review of Systems  All other systems reviewed and are negative.   Past Medical History:  Diagnosis Date   Allergic state    Allergy    Ankylosing spondylitis (HCC)    followed by rheumatology Dr. Lydia Sams   Arthritis    Asthma    Cervical disc disease    Chest pain    Depression    Enlarged lymph nodes    GERD (gastroesophageal reflux disease)    H/O colonoscopy    HPV (human papilloma virus) infection    Hypothyroidism    Interstitial cystitis  LGSIL (low grade squamous intraepithelial dysplasia)    Paresthesia    Pre-diabetes    Status post laparoscopic cholecystectomy 07/13/2016    Social History   Tobacco Use   Smoking status: Former    Current packs/day: 0.00    Average packs/day: 0.5 packs/day for 7.0 years (3.5 ttl pk-yrs)    Types: Cigarettes    Start date: 23    Quit date: 2003    Years since quitting: 22.4    Passive exposure: Never   Smokeless tobacco: Never  Vaping Use   Vaping status: Never Used  Substance Use Topics   Alcohol use: Yes    Comment: Occ.    Drug use: No    Family History  Problem Relation Age of Onset   Cancer Mother        Vulva cancer   Hypertension Mother    Stroke Mother    Goiter Mother    Diabetes Father    Hypertension Father    Hypertension Brother    Bladder Cancer Maternal Aunt    Hypertension Maternal Grandmother    Stroke Maternal Grandmother    Diabetes Maternal Grandmother    Breast cancer Cousin 40       pat cousin   Scheduled Meds:  aspirin   81 mg Oral BID   celecoxib   200 mg Oral BID WC   clotrimazole  1 Applicatorful Vaginal QHS   docusate sodium  100 mg Oral Daily   levothyroxine   200 mcg Oral Q0600   polyethylene glycol  17 g Oral BID   senna  2 tablet Oral QHS   Continuous Infusions:  cefTRIAXone (ROCEPHIN)  IV Stopped (02/28/24 1945)   PRN Meds:.acetaminophen  **OR** acetaminophen , fluticasone  furoate-vilanterol, HYDROmorphone  (DILAUDID ) injection, HYDROmorphone , ondansetron  **OR** ondansetron  (ZOFRAN ) IV Allergies  Allergen Reactions   Levofloxacin Rash and Dermatitis   Lodine [Etodolac] Shortness Of Breath   Adalimumab      Unknown reaction    Ciprofloxacin Hives   Latex Rash    OBJECTIVE: Blood pressure 133/84, pulse 92, temperature 98.3 F (36.8 C), resp. rate 17, height 5' 5 (1.651 m), weight 108.3 kg, last menstrual period 11/27/2023, SpO2 98%.  Physical Exam Constitutional:      Appearance: Normal appearance.  HENT:     Head: Normocephalic and atraumatic.     Right Ear: Tympanic membrane normal.     Left Ear: Tympanic membrane normal.     Nose: Nose normal.     Mouth/Throat:     Mouth: Mucous membranes are moist.   Eyes:     Extraocular Movements: Extraocular movements intact.     Conjunctiva/sclera: Conjunctivae normal.     Pupils: Pupils are equal, round, and reactive to light.    Cardiovascular:     Rate and Rhythm: Normal rate and regular rhythm.     Heart sounds: No murmur heard.    No friction rub. No gallop.  Pulmonary:     Effort: Pulmonary effort  is normal.     Breath sounds: Normal breath sounds.  Abdominal:     General: Abdomen is flat.     Palpations: Abdomen is soft.   Musculoskeletal:     Comments: R knee wound badaged   Skin:    General: Skin is warm and dry.   Neurological:     General: No focal deficit present.     Mental Status: She is alert and oriented to person, place, and time.   Psychiatric:        Mood and Affect: Mood  normal.     Lab Results Lab Results  Component Value Date   WBC 11.1 (H) 02/29/2024   HGB 11.1 (L) 02/29/2024   HCT 34.7 (L) 02/29/2024   MCV 88.7 02/29/2024   PLT 466 (H) 02/29/2024    Lab Results  Component Value Date   CREATININE 0.60 02/29/2024   BUN 14 02/29/2024   NA 136 02/29/2024   K 4.5 02/29/2024   CL 100 02/29/2024   CO2 26 02/29/2024    Lab Results  Component Value Date   ALT 19 02/29/2024   AST 22 02/29/2024   ALKPHOS 100 02/29/2024   BILITOT 1.1 02/29/2024       Orlie Bjornstad, MD [facility] 02/29/2024, 3:12 PM

## 2024-02-29 NOTE — Progress Notes (Signed)
 [facility]  PROGRESS NOTE  Hailey [name] JYN:[id number] DOB: 02/08/1976 DOA: 02/27/2024 PCP: Sheron Dixons, MD   Brief HPI:    48 y.o. female with medical history significant for asthma, hypothyroidism,, ankylosing spondylitis, interstitial cystitis, arthritis status post right knee arthroplasty on 6/10 who presented to the ED due to positive blood culture.  Patient reports that 2 days after her knee surgery, she started having chills and rigors described as shaking in the inside and outside.  She also reports associated nausea but no vomiting. She was evaluated in the ED on 6/13 and 6/14 and workup was overall unremarkable the patient was discharged directly from the ED.  A blood culture was obtained on 6/14 and one of the 2 bottles is growing gram-negative rods so the patient was called to present back to the ED    Assessment/Plan:    # Bacteremia - Patient with recent right knee surgery presented with intermittent rigors, nausea and generalized weakness - Blood culture from 6/14 shows gram-negative rods in 1/2 sets; blood culture growing Moraxella, final result pending - Patient afebrile, hemodynamically stable but with mild leukocytosis - Continue IV Rocephin - ID consulted, discussed with Dr. Zelda Hickman, she will see patient today.   # Knee arthritis s/p total knee arthroplasty - S/p total knee arthroplasty of the left knee on 4/29 and right knee on 6/10 - Left knee healing appropriately, right knee still covered with dressing, warmth and tender to touch - Pain control with as needed IV Dilaudid  and bowel regimen - Orthopedic surgery following   # Chronic interstitial cystitis - Recently treated for UTI - UA with no signs of acute infection - Pain control as above   # Asthma - Chronic and stable - Continue home bronchodilator   # Hypothyroidism - Continue Synthroid    # Ankylosing spondylitis - Continue Celebrex    # Class II obesity Body mass index is 39.73  kg/m.   Medications     aspirin   81 mg Oral BID   celecoxib   200 mg Oral BID WC   clotrimazole  1 Applicatorful Vaginal QHS   docusate sodium  100 mg Oral Daily   levothyroxine   200 mcg Oral Q0600   polyethylene glycol  17 g Oral BID   senna  2 tablet Oral QHS     Data Reviewed:   CBG:  Recent Labs  Lab 02/22/24 1517 02/23/24 0801  GLUCAP 120* 115*    SpO2: 100 %    Vitals:   02/28/24 0534 02/28/24 1348 02/28/24 2108 02/29/24 0511  BP: 120/78 133/88 138/79 118/78  Pulse: 69 79 80 72  Resp: 16 17 18 16   Temp: 97.8 F (36.6 C) 97.9 F (36.6 C) 98.1 F (36.7 C) 98.2 F (36.8 C)  TempSrc: Oral  Oral Oral  SpO2: 100% 100% 100% 100%  Weight:      Height:          Data Reviewed:  Basic Metabolic Panel: Recent Labs  Lab 02/22/24 1637 02/23/24 0824 02/27/24 2131 02/28/24 0356 02/29/24 0336  NA 137 137 137 133* 136  K 3.8 3.7 3.8 3.9 4.5  CL 102 100 103 102 100  CO2 24 25 20* 25 26  GLUCOSE 115* 117* 141* 113* 105*  BUN 13 7 19 19 14   CREATININE 0.78 0.73 0.70 0.57 0.60  CALCIUM 9.1 9.4 9.1 8.5* 9.3  MG 1.9  --   --   --   --     CBC: Recent Labs  Lab  02/22/24 1637 02/23/24 0824 02/27/24 1500 02/28/24 0356 02/29/24 0336  WBC 11.7* 10.6* 12.2* 12.3* 11.1*  NEUTROABS 7.4 6.7 7.0  --   --   HGB 11.8* 11.8* 12.7 10.8* 11.1*  HCT 36.6 35.9* 38.5 34.3* 34.7*  MCV 87.8 86.9 87.7 91.7 88.7  PLT 459* 480* 239 432* 466*    LFT Recent Labs  Lab 02/22/24 1637 02/23/24 0824 02/27/24 2131 02/29/24 0336  AST 17 16 17 22   ALT 15 15 15 19   ALKPHOS 120 100 104 100  BILITOT 0.6 0.8 0.7 1.1  PROT 7.4 6.9 7.5 7.4  ALBUMIN 3.8 3.5 3.8 3.7     Antibiotics: Anti-infectives (From admission, onward)    Start     Dose/Rate Route Frequency Ordered Stop   02/28/24 1800  cefTRIAXone (ROCEPHIN) 2 g in sodium chloride  0.9 % 100 mL IVPB        2 g 200 mL/hr over 30 Minutes Intravenous Every 24 hours 02/27/24 2259     02/28/24 1700  cefTRIAXone  (ROCEPHIN) 1 g in sodium chloride  0.9 % 100 mL IVPB  Status:  Discontinued        1 g 200 mL/hr over 30 Minutes Intravenous Every 24 hours 02/27/24 2258 02/27/24 2259   02/27/24 1745  cefTRIAXone (ROCEPHIN) 2 g in sodium chloride  0.9 % 100 mL IVPB        2 g 200 mL/hr over 30 Minutes Intravenous  Once 02/27/24 1743 02/27/24 1903        DVT prophylaxis: Aspirin  81 mg twice daily  Code Status: Full code  Family Communication: No family at bedside   CONSULTS    Subjective   Denies any complaints.  Objective    Physical Examination:  General-appears in no acute distress Heart-S1-S2, regular, no murmur auscultated Lungs-clear to auscultation bilaterally, no wheezing or crackles auscultated Abdomen-soft, nontender, no organomegaly Extremities-no edema in the lower extremities Neuro-alert, oriented x3, no focal deficit noted   Status is: Inpatient:             Hailey [name]   [facility] If 7PM-7AM, please contact night-coverage at [email or web address], Office  [phone]   02/29/2024, 8:10 AM  LOS: 1 day

## 2024-02-29 NOTE — Progress Notes (Signed)
   Subjective:    Patient reports pain as mild.   Patient seen in rounds for Dr. Bernard Brick. Patient is resting in bed on exam this morning. She became tearful when we were discussing the situation. She is feeling okay, still with some lower abdominal discomfort. She did very well with PT yesterday. I did not realize she had not been to OPPT yet due to all of this.  We will start therapy today.   Objective: Vital signs in last 24 hours: Temp:  [97.9 F (36.6 C)-98.2 F (36.8 C)] 98.2 F (36.8 C) (06/20 0511) Pulse Rate:  [72-80] 72 (06/20 0511) Resp:  [16-18] 16 (06/20 0511) BP: (118-138)/(78-88) 118/78 (06/20 0511) SpO2:  [100 %] 100 % (06/20 0511)  Intake/Output from previous day:  Intake/Output Summary (Last 24 hours) at 02/29/2024 0727 Last data filed at 02/28/2024 2200 Gross per 24 hour  Intake 700 ml  Output --  Net 700 ml     Intake/Output this shift: No intake/output data recorded.  Labs: Recent Labs    02/27/24 1500 02/28/24 0356 02/29/24 0336  HGB 12.7 10.8* 11.1*   Recent Labs    02/28/24 0356 02/29/24 0336  WBC 12.3* 11.1*  RBC 3.74* 3.91  HCT 34.3* 34.7*  PLT 432* 466*   Recent Labs    02/28/24 0356 02/29/24 0336  NA 133* 136  K 3.9 4.5  CL 102 100  CO2 25 26  BUN 19 14  CREATININE 0.57 0.60  GLUCOSE 113* 105*  CALCIUM 8.5* 9.3   No results for input(s): LABPT, INR in the last 72 hours.  Exam: General - Patient is Alert and Oriented Extremity - Neurologically intact Sensation intact distally Intact pulses distally Dorsiflexion/Plantar flexion intact Dressing - dressing C/D/I Motor Function - intact, moving foot and toes well on exam.   Past Medical History:  Diagnosis Date   Allergic state    Allergy    Ankylosing spondylitis (HCC)    followed by rheumatology Dr. Lydia Sams   Arthritis    Asthma    Cervical disc disease    Chest pain    Depression    Enlarged lymph nodes    GERD (gastroesophageal reflux disease)    H/O  colonoscopy    HPV (human papilloma virus) infection    Hypothyroidism    Interstitial cystitis    LGSIL (low grade squamous intraepithelial dysplasia)    Paresthesia    Pre-diabetes    Status post laparoscopic cholecystectomy 07/13/2016    Assessment/Plan:    Principal Problem:   Bacteremia  Estimated body mass index is 39.73 kg/m as calculated from the following:   Height as of this encounter: 5' 5 (1.651 m).   Weight as of this encounter: 108.3 kg.  ER blood cultures from 6/14 growing Moraxella species Dr. Alfonse Angle to consult ID  Continue working with PT while here She has enough meds at home for post op pain - will not send any additional  Kim Pen, PA-C Orthopedic Surgery [phone] 02/29/2024, 7:27 AM

## 2024-03-01 ENCOUNTER — Other Ambulatory Visit (HOSPITAL_COMMUNITY): Payer: Self-pay

## 2024-03-01 DIAGNOSIS — R7881 Bacteremia: Secondary | ICD-10-CM | POA: Diagnosis not present

## 2024-03-01 MED ORDER — CLOTRIMAZOLE 1 % VA CREA
1.0000 | TOPICAL_CREAM | Freq: Every day | VAGINAL | 0 refills | Status: AC
  Filled 2024-03-01: qty 45, 5d supply, fill #0

## 2024-03-01 MED ORDER — AMOXICILLIN-POT CLAVULANATE 875-125 MG PO TABS
1.0000 | ORAL_TABLET | Freq: Two times a day (BID) | ORAL | 0 refills | Status: DC
Start: 2024-03-01 — End: 2024-03-07
  Filled 2024-03-01: qty 12, 6d supply, fill #0

## 2024-03-01 NOTE — Progress Notes (Signed)
 Hailey [name]  MRN: [id number] DOB/Age: 48-16-1977 48 y.o. [facility] Procedure:      Subjective: Up in chair, anxious to go home. Seen by ID yesterday who felt bacteremia was due to urinary source, transitioned from ceftriaxone  to augmentin  for marcella bacteremia  Vital Signs Temp:  [97.5 F (36.4 C)-98.3 F (36.8 C)] 97.5 F (36.4 C) (06/21 0629) Pulse Rate:  [78-92] 86 (06/21 0629) Resp:  [17] 17 (06/21 0629) BP: (120-133)/(82-87) 128/82 (06/21 0629) SpO2:  [98 %-100 %] 100 % (06/21 0629)  Lab Results Recent Labs    02/28/24 0356 02/29/24 0336  WBC 12.3* 11.1*  HGB 10.8* 11.1*  HCT 34.3* 34.7*  PLT 432* 466*   BMET Recent Labs    02/28/24 0356 02/29/24 0336  NA 133* 136  K 3.9 4.5  CL 102 100  CO2 25 26  GLUCOSE 113* 105*  BUN 19 14  CREATININE 0.57 0.60  CALCIUM 8.5* 9.3   INR  Date Value Ref Range Status  02/05/2020 1.0 0.8 - 1.2 Final    Comment:    (NOTE) INR goal varies based on device and disease states. Performed at [facility], [address] [place], [place] [address]      Exam Right knee still looks very benign, dressing dry, no erythema and minimal swelling        Plan Hopeful for DC later today if Hospitalist feels appropriate Continue outpatient TKA protocol  Randine Ricks PA-C  03/01/2024, 9:48 AM Contact # [phone]

## 2024-03-01 NOTE — Plan of Care (Signed)
  Problem: Education: Goal: Knowledge of General Education information will improve Description: Including pain rating scale, medication(s)/side effects and non-pharmacologic comfort measures Outcome: Adequate for Discharge   Problem: Health Behavior/Discharge Planning: Goal: Ability to manage health-related needs will improve Outcome: Adequate for Discharge   Problem: Clinical Measurements: Goal: Ability to maintain clinical measurements within normal limits will improve Outcome: Progressing Goal: Will remain free from infection Outcome: Progressing Goal: Diagnostic test results will improve Outcome: Progressing Goal: Respiratory complications will improve Outcome: Progressing Goal: Cardiovascular complication will be avoided Outcome: Progressing   Problem: Activity: Goal: Risk for activity intolerance will decrease Outcome: Adequate for Discharge   Problem: Nutrition: Goal: Adequate nutrition will be maintained Outcome: Completed/Met   Problem: Coping: Goal: Level of anxiety will decrease Outcome: Progressing   Problem: Pain Managment: Goal: General experience of comfort will improve and/or be controlled Outcome: Progressing   Problem: Safety: Goal: Ability to remain free from injury will improve Outcome: Progressing   Problem: Skin Integrity: Goal: Risk for impaired skin integrity will decrease Outcome: Adequate for Discharge

## 2024-03-01 NOTE — Progress Notes (Signed)
 Physical Therapy Treatment Patient Details Name: Hailey [name] MRN: [id number] DOB: 1975/11/07 Today's Date: 03/01/2024   History of Present Illness 48 yo  female admitted with  positive blood cultures, RTKA 02/19/24, PMH: LTKA, ankylosing spondylitis, polyrthalagia,GERD,dyslipidemia,lymphadenopathy, OA, interstitial cystitis, recurrent UTI's    PT Comments  Pt amb the hallway IND with walker > 500 feet with good alternating gait and equal weight shift.  Pt c/o tightness.  Pt also faithfully performing her TKR TE's.  Reviewed and performed a few. After performing AAROM HS using a long bed sheet, took Goiniometric measurement R knee lacking 6 degrees of zero extension and flexion was recorded at 74 degrees. Pt hopes to D/C to home today and start her OP PT which she missed due to illness.      If plan is discharge home, recommend the following: Assistance with cooking/housework;Assist for transportation;Help with stairs or ramp for entrance   Can travel by private vehicle        Equipment Recommendations  None recommended by PT    Recommendations for Other Services       Precautions / Restrictions Precautions Precautions: Knee Restrictions Weight Bearing Restrictions Per Provider Order: No RLE Weight Bearing Per Provider Order: Weight bearing as tolerated     Mobility  Bed Mobility Overal bed mobility: Modified Independent                  Transfers Overall transfer level: Modified independent                      Ambulation/Gait Ambulation/Gait assistance: Modified independent (Device/Increase time)       Gait velocity: WFL     General Gait Details: Pt IND amb around unit > 500 feet with walker.  Good alternating gait and equal weight shift.   Stairs             Wheelchair Mobility     Tilt Bed    Modified Rankin (Stroke Patients Only)       Balance                                            Communication     Cognition Arousal: Alert Behavior During Therapy: WFL for tasks assessed/performed   PT - Cognitive impairments: No apparent impairments                       PT - Cognition Comments: AxO x 4 pleasant and motivated Following commands: Intact      Cueing    Exercises  Total Knee Replacement TE's following HEP handout 10 reps B LE ankle pumps 10 reps knee presses 10 reps heel slides  Educated on use of long bed sheet to assist with TE's Followed by [facility]     General Comments        Pertinent Vitals/Pain Pain Assessment Pain Assessment: Faces Faces Pain Scale: Hurts a little bit Pain Location: R knee Pain Descriptors / Indicators: Aching, Tightness Pain Intervention(s): Monitored during session    Home Living                          Prior Function            PT Goals (current goals can now be found in the care plan section) Progress towards PT goals:  Progressing toward goals    Frequency    Min 5X/week      PT Plan      Co-evaluation              AM-PAC PT 6 Clicks Mobility   Outcome Measure  Help needed turning from your back to your side while in a flat bed without using bedrails?: None Help needed moving from lying on your back to sitting on the side of a flat bed without using bedrails?: None Help needed moving to and from a bed to a chair (including a wheelchair)?: None Help needed standing up from a chair using your arms (e.g., wheelchair or bedside chair)?: None Help needed to walk in hospital room?: None Help needed climbing 3-5 steps with a railing? : None 6 Click Score: 24    End of Session   Activity Tolerance: Patient tolerated treatment well Patient left: with call bell/phone within reach;in chair Nurse Communication: Mobility status PT Visit Diagnosis: Other abnormalities of gait and mobility (R26.89);Muscle weakness (generalized) (M62.81)     Time: 1034-1050 PT Time Calculation (min) (ACUTE ONLY): 16  min  Charges:    $Therapeutic Exercise: 8-22 mins PT General Charges $$ ACUTE PT VISIT: 1 Visit                    Katheryn Leap  PTA Acute  Rehabilitation Services Office M-F          [phone]

## 2024-03-01 NOTE — Discharge Summary (Signed)
 Physician Discharge Summary   Patient: Hailey [name] MRN: [id number] DOB: 11-27-1975  Admit date:     02/27/2024  Discharge date: 03/01/24  Discharge Physician: Sabas GORMAN Brod   PCP: Justus Leita DEL, MD   Recommendations at discharge:   Follow-up PCP in 1 week Follow-up on MIC for cipro, augmentin , amox, and ceftriaxone    Discharge Diagnoses: Principal Problem:   Bacteremia  Resolved Problems:   * No resolved hospital problems. *  Hospital Course: 48 y.o. female with medical history significant for asthma, hypothyroidism,, ankylosing spondylitis, interstitial cystitis, arthritis status post right knee arthroplasty on 6/10 who presented to the ED due to positive blood culture.  Patient reports that 2 days after her knee surgery, she started having chills and rigors described as shaking in the inside and outside.  She also reports associated nausea but no vomiting. She was evaluated in the ED on 6/13 and 6/14 and workup was overall unremarkable the patient was discharged directly from the ED.  A blood culture was obtained on 6/14 and one of the 2 bottles is growing gram-negative rods so the patient was called to present back to the ED     Assessment and Plan:  # Bacteremia - Patient with recent right knee surgery presented with intermittent rigors, nausea and generalized weakness - Blood culture from 6/14 shows gram-negative rods in 1/2 sets; blood culture growing Moraxella, final result pending - Patient afebrile, hemodynamically stable but with mild leukocytosis - Continue IV Rocephin  - ID consulted, likely source of infection is urinary.  Patient has history of interstitial cystitis. -Recommend to switch to Augmentin  to complete 10 days of treatment.  End of treatment on 6/27. -Final result from blood culture is still pending. - ID recommends to follow  MIC for cipro, augmentin , amox, and ceftriaxone -PCP office  # Knee arthritis s/p total knee arthroplasty - S/p total knee  arthroplasty of the left knee on 4/29 and right knee on 6/10 - Left knee healing appropriately, right knee still covered with dressing, warmth and tender to touch - Pain control with as needed IV Dilaudid  and bowel regimen - Orthopedic surgery following   # Chronic interstitial cystitis - Recently treated for UTI - UA with no signs of acute infection - Pain control as above   # Asthma - Chronic and stable - Continue home bronchodilator   # Hypothyroidism - Continue Synthroid    # Ankylosing spondylitis - Continue Celebrex    # Class II obesity Body mass index is 39.73 kg/m.              Consultants: Infectious disease Procedures performed:  Disposition: Home Diet recommendation:  Discharge Diet Orders (From admission, onward)     Start     Ordered   03/01/24 0000  Diet - low sodium heart healthy        03/01/24 1304            DISCHARGE MEDICATION: Allergies as of 03/01/2024       Reactions   Levofloxacin Rash, Dermatitis   Lodine [etodolac] Shortness Of Breath   Adalimumab     Unknown reaction    Ciprofloxacin Hives   Latex Rash        Medication List     TAKE these medications    albuterol  108 (90 Base) MCG/ACT inhaler Commonly known as: VENTOLIN  HFA Inhale 2 puffs into the lungs every 6 (six) hours as needed for wheezing or shortness of breath.   amoxicillin -clavulanate 875-125 MG tablet Commonly known as: AUGMENTIN   Take 1 tablet by mouth 2 (two) times daily for 6 days.   aspirin  81 MG chewable tablet Chew 1 tablet (81 mg total) by mouth 2 (two) times daily.   celecoxib  200 MG capsule Commonly known as: CELEBREX  Take 1 capsule (200 mg total) by mouth 2 (two) times daily with a meal.   clotrimazole  1 % vaginal cream Commonly known as: GYNE-LOTRIMIN  Place 1 Applicatorful vaginally at bedtime for 5 days.   fluticasone -salmeterol 100-50 MCG/ACT Aepb Commonly known as: Advair  Diskus Inhale 1 puff into the lungs 2 (two) times daily as  needed. What changed:  when to take this reasons to take this   HYDROmorphone  2 MG tablet Commonly known as: DILAUDID  Take 2 mg by mouth every 4 (four) hours as needed for severe pain (pain score 7-10).   methocarbamol  500 MG tablet Commonly known as: ROBAXIN  Take 1 tablet (500 mg total) by mouth every 6 (six) hours as needed.   metoCLOPramide  10 MG tablet Commonly known as: REGLAN  Take 1 tablet (10 mg total) by mouth every 6 (six) hours as needed for nausea.   multivitamin with minerals Tabs tablet Take 1 tablet by mouth daily.   ondansetron  4 MG disintegrating tablet Commonly known as: ZOFRAN -ODT Take 1 tablet by mouth every 6 hours as needed for nausea/vomiting   oxyCODONE  5 MG immediate release tablet Commonly known as: Oxy IR/ROXICODONE  Take 1 tablet (5 mg total) by mouth every 4 (four) hours as needed for severe pain.   polyethylene glycol powder 17 GM/SCOOP powder Commonly known as: GLYCOLAX /MIRALAX  Mix 1 capful (17 grams) in liquid and drink by mouth 2 (two) times daily.   promethazine  25 MG tablet Commonly known as: PHENERGAN  Take 1 tablet (25 mg total) by mouth every 6 (six) hours as needed for nausea or vomiting.   senna 8.6 MG tablet Commonly known as: SENOKOT Take 2 tablets (17.2 mg total) by mouth at bedtime for 14 days.   Synthroid  200 MCG tablet Generic drug: levothyroxine  Take 1 tablet (200 mcg total) by mouth once daily. Take on an empty stomach with a glass of water  at least 30-60 minutes before breakfast.   tiZANidine  4 MG tablet Commonly known as: ZANAFLEX  Take 1 tablet (4 mg total) by mouth at bedtime as needed for muscle spasm or pain        Follow-up Information     Justus Leita DEL, MD Follow up in 1 week(s).   Specialty: Internal Medicine Contact information: [address] [place] [address] [phone]         Dennise Kingsley, MD Follow up.   Specialty: Infectious Diseases Why: As needed Contact  information: [address], [address] [place] [address] [phone]                Discharge Exam: Fredricka Weights   02/27/24 2148  Weight: 108.3 kg   General-appears in no acute distress Heart-S1-S2, regular, no murmur auscultated Lungs-clear to auscultation bilaterally, no wheezing or crackles auscultated Abdomen-soft, nontender, no organomegaly Extremities-no edema in the lower extremities Neuro-alert, oriented x3, no focal deficit noted  Condition at discharge: good  The results of significant diagnostics from this hospitalization (including imaging, microbiology, ancillary and laboratory) are listed below for reference.   Imaging Studies: DG Knee 1-2 Views Right Result Date: 02/29/2024 CLINICAL DATA:  Infection EXAM: RIGHT KNEE - 1-2 VIEW COMPARISON:  None Available. FINDINGS: Total knee arthroplasty is present. There is no acute fracture or cortical erosion identified. There is soft  tissue swelling of the anterior knee with likely joint effusion. IMPRESSION: 1. Total knee arthroplasty. No acute fracture or cortical erosion identified. 2. Soft tissue swelling of the anterior knee with likely joint effusion. Infection not excluded. Electronically Signed   By: Greig Pique M.D.   On: 02/29/2024 17:11   CT Angio Chest PE W and/or Wo Contrast Result Date: 02/23/2024 CLINICAL DATA:  Pulmonary embolism suspected, high probability. Abdominal pain, weakness, and shaking. EXAM: CT ANGIOGRAPHY CHEST CT ABDOMEN AND PELVIS WITH CONTRAST TECHNIQUE: Multidetector CT imaging of the chest was performed using the standard protocol during bolus administration of intravenous contrast. Multiplanar CT image reconstructions and MIPs were obtained to evaluate the vascular anatomy. Multidetector CT imaging of the abdomen and pelvis was performed using the standard protocol during bolus administration of intravenous contrast. RADIATION DOSE REDUCTION: This exam was performed according to the  departmental dose-optimization program which includes automated exposure control, adjustment of the mA and/or kV according to patient size and/or use of iterative reconstruction technique. CONTRAST:  75mL OMNIPAQUE  IOHEXOL  350 MG/ML SOLN COMPARISON:  11/29/2023, 02/22/2024. FINDINGS: CTA CHEST FINDINGS Cardiovascular: The heart is normal in size and there is a trace pericardial effusion. The aorta at and pulmonary trunk are normal in caliber. Evaluation of segmental and subsegmental pulmonary arteries is limited due to suboptimal opacification. No large central pulmonary embolus is seen. Mediastinum/Nodes: No mediastinal, hilar, or axillary lymphadenopathy is seen. Surgical clips are present in the thyroid  bed. The trachea and esophagus are within normal limits. Lungs/Pleura: No consolidation, effusion, or pneumothorax is seen. A stable pulmonary cyst is present in the left lower lobe. Musculoskeletal: No acute osseous abnormality. Review of the MIP images confirms the above findings. CT ABDOMEN and PELVIS FINDINGS Hepatobiliary: No focal liver abnormality is seen. Status post cholecystectomy. No biliary dilatation. Pancreas: Unremarkable. No pancreatic ductal dilatation or surrounding inflammatory changes. Spleen: Normal in size without focal abnormality. Adrenals/Urinary Tract: The adrenal glands are within normal limits. The kidneys enhance symmetrically. No renal or ureteral calculus or obstructive uropathy bilaterally. The bladder is unremarkable. Stomach/Bowel: Stomach is within normal limits. Appendix appears normal. No evidence of bowel wall thickening, distention, or inflammatory changes. No free air or pneumatosis. Vascular/Lymphatic: No abdominal or pelvic lymphadenopathy by size criteria. Reproductive: The uterus is within normal limits. There is a hypodense structure in the right ovary measuring 5.6 cm, unchanged from the prior exam and previously characterized as cyst. No adnexal mass on the left.  Other: No abdominopelvic ascites. A fat containing umbilical hernia is present. Musculoskeletal: No acute osseous abnormality. Sclerosis is noted at the sacroiliac joints bilaterally, greater on the left than on the right, compatible with sacroiliitis. Review of the MIP images confirms the above findings. IMPRESSION: 1. No large central pulmonary embolus or other acute process in the chest. 2. No acute process in the abdomen and pelvis. 3. Stable right ovarian cyst. Electronically Signed   By: Leita Birmingham M.D.   On: 02/23/2024 15:59   CT ABDOMEN PELVIS W CONTRAST Result Date: 02/23/2024 CLINICAL DATA:  Pulmonary embolism suspected, high probability. Abdominal pain, weakness, and shaking. EXAM: CT ANGIOGRAPHY CHEST CT ABDOMEN AND PELVIS WITH CONTRAST TECHNIQUE: Multidetector CT imaging of the chest was performed using the standard protocol during bolus administration of intravenous contrast. Multiplanar CT image reconstructions and MIPs were obtained to evaluate the vascular anatomy. Multidetector CT imaging of the abdomen and pelvis was performed using the standard protocol during bolus administration of intravenous contrast. RADIATION DOSE REDUCTION: This exam was performed  according to the departmental dose-optimization program which includes automated exposure control, adjustment of the mA and/or kV according to patient size and/or use of iterative reconstruction technique. CONTRAST:  75mL OMNIPAQUE  IOHEXOL  350 MG/ML SOLN COMPARISON:  11/29/2023, 02/22/2024. FINDINGS: CTA CHEST FINDINGS Cardiovascular: The heart is normal in size and there is a trace pericardial effusion. The aorta at and pulmonary trunk are normal in caliber. Evaluation of segmental and subsegmental pulmonary arteries is limited due to suboptimal opacification. No large central pulmonary embolus is seen. Mediastinum/Nodes: No mediastinal, hilar, or axillary lymphadenopathy is seen. Surgical clips are present in the thyroid  bed. The trachea  and esophagus are within normal limits. Lungs/Pleura: No consolidation, effusion, or pneumothorax is seen. A stable pulmonary cyst is present in the left lower lobe. Musculoskeletal: No acute osseous abnormality. Review of the MIP images confirms the above findings. CT ABDOMEN and PELVIS FINDINGS Hepatobiliary: No focal liver abnormality is seen. Status post cholecystectomy. No biliary dilatation. Pancreas: Unremarkable. No pancreatic ductal dilatation or surrounding inflammatory changes. Spleen: Normal in size without focal abnormality. Adrenals/Urinary Tract: The adrenal glands are within normal limits. The kidneys enhance symmetrically. No renal or ureteral calculus or obstructive uropathy bilaterally. The bladder is unremarkable. Stomach/Bowel: Stomach is within normal limits. Appendix appears normal. No evidence of bowel wall thickening, distention, or inflammatory changes. No free air or pneumatosis. Vascular/Lymphatic: No abdominal or pelvic lymphadenopathy by size criteria. Reproductive: The uterus is within normal limits. There is a hypodense structure in the right ovary measuring 5.6 cm, unchanged from the prior exam and previously characterized as cyst. No adnexal mass on the left. Other: No abdominopelvic ascites. A fat containing umbilical hernia is present. Musculoskeletal: No acute osseous abnormality. Sclerosis is noted at the sacroiliac joints bilaterally, greater on the left than on the right, compatible with sacroiliitis. Review of the MIP images confirms the above findings. IMPRESSION: 1. No large central pulmonary embolus or other acute process in the chest. 2. No acute process in the abdomen and pelvis. 3. Stable right ovarian cyst. Electronically Signed   By: Leita Birmingham M.D.   On: 02/23/2024 15:59   DG Chest 2 View Result Date: 02/23/2024 CLINICAL DATA:  Upper respiratory infection.  Suspected pneumonia. EXAM: CHEST - 2 VIEW COMPARISON:  11/27/2023 FINDINGS: The heart size and  mediastinal contours are within normal limits. Both lungs are clear. The visualized skeletal structures are unremarkable. IMPRESSION: No active cardiopulmonary disease. Electronically Signed   By: Norleen DELENA Kil M.D.   On: 02/23/2024 09:25   US  Pelvis Complete Result Date: 02/22/2024 CLINICAL DATA:  Follow-up right ovarian cyst EXAM: TRANSABDOMINAL AND TRANSVAGINAL ULTRASOUND OF PELVIS DOPPLER ULTRASOUND OF OVARIES TECHNIQUE: Both transabdominal and transvaginal ultrasound examinations of the pelvis were performed. Transabdominal technique was performed for global imaging of the pelvis including uterus, ovaries, adnexal regions, and pelvic cul-de-sac. It was necessary to proceed with endovaginal exam following the transabdominal exam to visualize the ovaries. Color and duplex Doppler ultrasound was utilized to evaluate blood flow to the ovaries. COMPARISON:  CT from earlier in the same day. FINDINGS: Uterus Measurements: 7.3 x 3.6 x 4.5 cm. = volume: 61 mL. 1.6 cm fibroid is noted anteriorly on the left. Nabothian cysts are noted within the cervix. Endometrium Thickness: 4 mm.  No focal abnormality visualized. Right ovary Measurements: 5.0 x 4.4 x 4.6 cm. = volume: 52 mL. 4.6 cm cyst is noted similar to that seen on the prior CT examination. No evidence of ovarian torsion is noted. Left ovary Not well visualized  Pulsed Doppler evaluation of the right ovary demonstrates normal low-resistance arterial and venous waveforms. Other findings No abnormal free fluid. IMPRESSION: 4.6 cm right ovarian cyst. No evidence of torsion is noted. No follow-up is recommended. Small left uterine fibroid. Electronically Signed   By: Oneil Devonshire M.D.   On: 02/22/2024 22:26   US  Transvaginal Non-OB Result Date: 02/22/2024 CLINICAL DATA:  Follow-up right ovarian cyst EXAM: TRANSABDOMINAL AND TRANSVAGINAL ULTRASOUND OF PELVIS DOPPLER ULTRASOUND OF OVARIES TECHNIQUE: Both transabdominal and transvaginal ultrasound examinations of the  pelvis were performed. Transabdominal technique was performed for global imaging of the pelvis including uterus, ovaries, adnexal regions, and pelvic cul-de-sac. It was necessary to proceed with endovaginal exam following the transabdominal exam to visualize the ovaries. Color and duplex Doppler ultrasound was utilized to evaluate blood flow to the ovaries. COMPARISON:  CT from earlier in the same day. FINDINGS: Uterus Measurements: 7.3 x 3.6 x 4.5 cm. = volume: 61 mL. 1.6 cm fibroid is noted anteriorly on the left. Nabothian cysts are noted within the cervix. Endometrium Thickness: 4 mm.  No focal abnormality visualized. Right ovary Measurements: 5.0 x 4.4 x 4.6 cm. = volume: 52 mL. 4.6 cm cyst is noted similar to that seen on the prior CT examination. No evidence of ovarian torsion is noted. Left ovary Not well visualized Pulsed Doppler evaluation of the right ovary demonstrates normal low-resistance arterial and venous waveforms. Other findings No abnormal free fluid. IMPRESSION: 4.6 cm right ovarian cyst. No evidence of torsion is noted. No follow-up is recommended. Small left uterine fibroid. Electronically Signed   By: Oneil Devonshire M.D.   On: 02/22/2024 22:26   US  Art/Ven Flow Abd Pelv Doppler Result Date: 02/22/2024 CLINICAL DATA:  Follow-up right ovarian cyst EXAM: TRANSABDOMINAL AND TRANSVAGINAL ULTRASOUND OF PELVIS DOPPLER ULTRASOUND OF OVARIES TECHNIQUE: Both transabdominal and transvaginal ultrasound examinations of the pelvis were performed. Transabdominal technique was performed for global imaging of the pelvis including uterus, ovaries, adnexal regions, and pelvic cul-de-sac. It was necessary to proceed with endovaginal exam following the transabdominal exam to visualize the ovaries. Color and duplex Doppler ultrasound was utilized to evaluate blood flow to the ovaries. COMPARISON:  CT from earlier in the same day. FINDINGS: Uterus Measurements: 7.3 x 3.6 x 4.5 cm. = volume: 61 mL. 1.6 cm fibroid  is noted anteriorly on the left. Nabothian cysts are noted within the cervix. Endometrium Thickness: 4 mm.  No focal abnormality visualized. Right ovary Measurements: 5.0 x 4.4 x 4.6 cm. = volume: 52 mL. 4.6 cm cyst is noted similar to that seen on the prior CT examination. No evidence of ovarian torsion is noted. Left ovary Not well visualized Pulsed Doppler evaluation of the right ovary demonstrates normal low-resistance arterial and venous waveforms. Other findings No abnormal free fluid. IMPRESSION: 4.6 cm right ovarian cyst. No evidence of torsion is noted. No follow-up is recommended. Small left uterine fibroid. Electronically Signed   By: Oneil Devonshire M.D.   On: 02/22/2024 22:26   CT ABDOMEN PELVIS W CONTRAST Result Date: 02/22/2024 EXAM: CT ABDOMEN AND PELVIS WITH CONTRAST 02/22/2024 08:44:09 PM TECHNIQUE: CT of the abdomen and pelvis was performed with the administration of intravenous contrast, 100mL of iohexol  (OMNIPAQUE ) 300 MG/ML solution. Multiplanar reformatted images are provided for review. Automated exposure control, iterative reconstruction, and/or weight-based adjustment of the mA/kV was utilized to reduce the radiation dose to as low as reasonably achievable. COMPARISON: Comparison to CT from 07/07/2022. CLINICAL HISTORY: Bowel obstruction suspected; RLQ abdominal pain. Patient came  in from home, complaining of increased weakness, nausea, and near syncopal episodes since yesterday. Had total knee surgery Tuesday. Left messages with office with no return phone call. FINDINGS: LOWER CHEST: No acute abnormality. LIVER: The liver is unremarkable. GALLBLADDER AND BILE DUCTS: Status post cholecystectomy. No biliary ductal dilatation. SPLEEN: No acute abnormality. PANCREAS: No acute abnormality. ADRENAL GLANDS: No acute abnormality. KIDNEYS, URETERS AND BLADDER: No stones in the kidneys or ureters. No hydronephrosis. No perinephric or periureteral stranding. Urinary bladder is unremarkable. GI AND  BOWEL: Appendix is normal, as seen on image 71. Stomach demonstrates no acute abnormality. There is no bowel obstruction. No bowel wall thickening. REPRODUCTIVE ORGANS: A 5.6 cm right ovarian cystic lesion is present, previously 3.8 cm. Uterus and left ovary are unremarkable. PERITONEUM AND RETROPERITONEUM: No ascites. No free air. VASCULATURE: Aorta is normal in caliber. LYMPH NODES: No lymphadenopathy. BONES AND SOFT TISSUES: No acute osseous abnormality. No focal soft tissue abnormality. IMPRESSION: 1. Normal appendix. 2. 5.6 cm right ovarian cystic lesion, increased. Consider pelvic ultrasound with doppler for further evaluation in this clinical setting. Electronically signed by: Pinkie Pebbles MD 02/22/2024 08:54 PM EDT RP Workstation: [id number]    Microbiology: Results for orders placed or performed during the hospital encounter of 02/27/24  Culture, blood (Routine X 2) w Reflex to ID Panel     Status: None (Preliminary result)   Collection Time: 02/27/24  3:00 PM   Specimen: BLOOD  Result Value Ref Range Status   Specimen Description   Final    BLOOD RIGHT ANTECUBITAL Performed at [facility], [address], [place], [place] [address]    Special Requests   Final    BOTTLES DRAWN AEROBIC AND ANAEROBIC Blood Culture results may not be optimal due to an inadequate volume of blood received in culture bottles Performed at [facility], [address], [place], [place] [address]    Culture   Final    NO GROWTH 3 DAYS Performed at [facility], [address], [place], [place] [address]    Report Status PENDING  Incomplete  Culture, blood (Routine X 2) w Reflex to ID Panel     Status: None (Preliminary result)   Collection Time: 02/27/24  7:00 PM   Specimen: BLOOD  Result Value Ref Range Status   Specimen Description   Final    BLOOD LEFT ANTECUBITAL Performed at [facility], [address], [place], [place] [address]     Special Requests   Final    BOTTLES DRAWN AEROBIC ONLY Blood Culture adequate volume Performed at [facility], [address], [place], [place] [address]    Culture   Final    NO GROWTH 3 DAYS Performed at [facility], [address], [place], [place] [address]    Report Status PENDING  Incomplete    Labs: CBC: Recent Labs  Lab 02/27/24 1500 02/28/24 0356 02/29/24 0336  WBC 12.2* 12.3* 11.1*  NEUTROABS 7.0  --   --   HGB 12.7 10.8* 11.1*  HCT 38.5 34.3* 34.7*  MCV 87.7 91.7 88.7  PLT 239 432* 466*   Basic Metabolic Panel: Recent Labs  Lab 02/27/24 2131 02/28/24 0356 02/29/24 0336  NA 137 133* 136  K 3.8 3.9 4.5  CL 103 102 100  CO2 20* 25 26  GLUCOSE 141* 113* 105*  BUN 19 19 14   CREATININE 0.70 0.57 0.60  CALCIUM 9.1 8.5* 9.3   Liver Function Tests:  Recent Labs  Lab 02/27/24 2131 02/29/24 0336  AST 17 22  ALT 15 19  ALKPHOS 104 100  BILITOT 0.7 1.1  PROT 7.5 7.4  ALBUMIN 3.8 3.7   CBG: No results for input(s): GLUCAP in the last 168 hours.  Discharge time spent: greater than 30 minutes.  Signed: Sabas GORMAN Brod, MD [facility] 03/01/2024

## 2024-03-01 NOTE — Progress Notes (Signed)
 [facility]  PROGRESS NOTE  Hailey [name] FMW:[id number] DOB: 01/22/1976 DOA: 02/27/2024 PCP: Justus Leita DEL, MD   Brief HPI:    48 y.o. female with medical history significant for asthma, hypothyroidism,, ankylosing spondylitis, interstitial cystitis, arthritis status post right knee arthroplasty on 6/10 who presented to the ED due to positive blood culture.  Patient reports that 2 days after her knee surgery, she started having chills and rigors described as shaking in the inside and outside.  She also reports associated nausea but no vomiting. She was evaluated in the ED on 6/13 and 6/14 and workup was overall unremarkable the patient was discharged directly from the ED.  A blood culture was obtained on 6/14 and one of the 2 bottles is growing gram-negative rods so the patient was called to present back to the ED    Assessment/Plan:    # Bacteremia - Patient with recent right knee surgery presented with intermittent rigors, nausea and generalized weakness - Blood culture from 6/14 shows gram-negative rods in 1/2 sets; blood culture growing Moraxella, final result pending - Patient afebrile, hemodynamically stable but with mild leukocytosis - Continue IV Rocephin  - ID consulted, likely source of infection is urinary.  Patient has history of interstitial cystitis. -Recommend to switch to Augmentin  to complete 10 days of treatment.  End of treatment on 6/27. -Final result from blood culture is still pending.   # Knee arthritis s/p total knee arthroplasty - S/p total knee arthroplasty of the left knee on 4/29 and right knee on 6/10 - Left knee healing appropriately, right knee still covered with dressing, warmth and tender to touch - Pain control with as needed IV Dilaudid  and bowel regimen - Orthopedic surgery following   # Chronic interstitial cystitis - Recently treated for UTI - UA with no signs of acute infection - Pain control as above   # Asthma - Chronic and stable -  Continue home bronchodilator   # Hypothyroidism - Continue Synthroid    # Ankylosing spondylitis - Continue Celebrex    # Class II obesity Body mass index is 39.73 kg/m.   Medications     aspirin   81 mg Oral BID   celecoxib   200 mg Oral BID WC   clotrimazole   1 Applicatorful Vaginal QHS   docusate sodium   100 mg Oral Daily   levothyroxine   200 mcg Oral Q0600   polyethylene glycol  17 g Oral BID   senna  2 tablet Oral QHS     Data Reviewed:   CBG:  No results for input(s): GLUCAP in the last 168 hours.   SpO2: 100 %    Vitals:   02/29/24 0511 02/29/24 1315 02/29/24 2146 03/01/24 0629  BP: 118/78 133/84 120/87 128/82  Pulse: 72 92 78 86  Resp: 16 17 17 17   Temp: 98.2 F (36.8 C) 98.3 F (36.8 C) 97.8 F (36.6 C) (!) 97.5 F (36.4 C)  TempSrc: Oral     SpO2: 100% 98% 100% 100%  Weight:      Height:          Data Reviewed:  Basic Metabolic Panel: Recent Labs  Lab 02/27/24 2131 02/28/24 0356 02/29/24 0336  NA 137 133* 136  K 3.8 3.9 4.5  CL 103 102 100  CO2 20* 25 26  GLUCOSE 141* 113* 105*  BUN 19 19 14   CREATININE 0.70 0.57 0.60  CALCIUM 9.1 8.5* 9.3    CBC: Recent Labs  Lab 02/27/24 1500 02/28/24 0356 02/29/24 0336  WBC  12.2* 12.3* 11.1*  NEUTROABS 7.0  --   --   HGB 12.7 10.8* 11.1*  HCT 38.5 34.3* 34.7*  MCV 87.7 91.7 88.7  PLT 239 432* 466*    LFT Recent Labs  Lab 02/27/24 2131 02/29/24 0336  AST 17 22  ALT 15 19  ALKPHOS 104 100  BILITOT 0.7 1.1  PROT 7.5 7.4  ALBUMIN 3.8 3.7     Antibiotics: Anti-infectives (From admission, onward)    Start     Dose/Rate Route Frequency Ordered Stop   02/28/24 1800  cefTRIAXone  (ROCEPHIN ) 2 g in sodium chloride  0.9 % 100 mL IVPB        2 g 200 mL/hr over 30 Minutes Intravenous Every 24 hours 02/27/24 2259     02/28/24 1700  cefTRIAXone  (ROCEPHIN ) 1 g in sodium chloride  0.9 % 100 mL IVPB  Status:  Discontinued        1 g 200 mL/hr over 30 Minutes Intravenous Every 24 hours  02/27/24 2258 02/27/24 2259   02/27/24 1745  cefTRIAXone  (ROCEPHIN ) 2 g in sodium chloride  0.9 % 100 mL IVPB        2 g 200 mL/hr over 30 Minutes Intravenous  Once 02/27/24 1743 02/27/24 1903        DVT prophylaxis: Aspirin  81 mg twice daily  Code Status: Full code  Family Communication: No family at bedside   CONSULTS    Subjective   Denies any complaints  Objective    Physical Examination:  General-appears in no acute distress Heart-S1-S2, regular, no murmur auscultated Lungs-clear to auscultation bilaterally, no wheezing or crackles auscultated Abdomen-soft, nontender, no organomegaly Extremities-right knee has mild edema, no erythema noted Neuro-alert, oriented x3, no focal deficit noted   Status is: Inpatient:             Sabas GORMAN Brod   [facility] If 7PM-7AM, please contact night-coverage at [email or web address], Office  [phone]   03/01/2024, 8:38 AM  LOS: 2 days

## 2024-03-01 NOTE — Progress Notes (Signed)
 Discharge medications delivered to patient at bedside [facility]

## 2024-03-01 NOTE — Progress Notes (Signed)
 Discharge instructions given to patient questions asked and answered. Sylvia Everts RN

## 2024-03-03 LAB — CULTURE, BLOOD (ROUTINE X 2)
Culture: NO GROWTH
Culture: NO GROWTH

## 2024-03-04 ENCOUNTER — Ambulatory Visit: Admitting: Physical Therapy

## 2024-03-04 ENCOUNTER — Encounter: Payer: Self-pay | Admitting: Internal Medicine

## 2024-03-04 DIAGNOSIS — G8918 Other acute postprocedural pain: Secondary | ICD-10-CM | POA: Diagnosis not present

## 2024-03-04 DIAGNOSIS — R262 Difficulty in walking, not elsewhere classified: Secondary | ICD-10-CM

## 2024-03-04 DIAGNOSIS — M25662 Stiffness of left knee, not elsewhere classified: Secondary | ICD-10-CM | POA: Diagnosis not present

## 2024-03-04 DIAGNOSIS — M25562 Pain in left knee: Secondary | ICD-10-CM | POA: Diagnosis not present

## 2024-03-04 DIAGNOSIS — M25661 Stiffness of right knee, not elsewhere classified: Secondary | ICD-10-CM

## 2024-03-04 DIAGNOSIS — M6281 Muscle weakness (generalized): Secondary | ICD-10-CM

## 2024-03-04 DIAGNOSIS — M25561 Pain in right knee: Secondary | ICD-10-CM | POA: Diagnosis not present

## 2024-03-04 NOTE — Therapy (Unsigned)
 OUTPATIENT PHYSICAL THERAPY KNEE EVALUATION  Patient Name: Hailey [name] MRN: [id number] DOB:[birth year], 48 y.o., female Today's Date: 03/04/2024  END OF SESSION:   Past Medical History:  Diagnosis Date   Allergic state    Allergy    Ankylosing spondylitis (HCC)    followed by rheumatology Dr. Tobie   Arthritis    Asthma    Cervical disc disease    Chest pain    Depression    Enlarged lymph nodes    GERD (gastroesophageal reflux disease)    H/O colonoscopy    HPV (human papilloma virus) infection    Hypothyroidism    Interstitial cystitis    LGSIL (low grade squamous intraepithelial dysplasia)    Paresthesia    Pre-diabetes    Status post laparoscopic cholecystectomy 07/13/2016   Past Surgical History:  Procedure Laterality Date   BREAST BIOPSY Right 10/18/2015   bx/clip- neg   CHOLECYSTECTOMY N/A 07/07/2016   Procedure: LAPAROSCOPIC CHOLECYSTECTOMY;  Surgeon: Aloysius Plant, MD;  Location: [facility] ORS;  Service: General;  Laterality: N/A;   COLONOSCOPY WITH PROPOFOL  N/A 10/11/2015   Procedure: COLONOSCOPY WITH PROPOFOL ;  Surgeon: Gladis RAYMOND Mariner, MD;  Location: [facility] ENDOSCOPY;  Service: Endoscopy;  Laterality: N/A;   ESOPHAGOGASTRODUODENOSCOPY (EGD) WITH PROPOFOL  N/A 10/11/2015   Procedure: ESOPHAGOGASTRODUODENOSCOPY (EGD) WITH PROPOFOL ;  Surgeon: Gladis RAYMOND Mariner, MD;  Location: [facility] ENDOSCOPY;  Service: Endoscopy;  Laterality: N/A;   TONSILLECTOMY     TOTAL KNEE ARTHROPLASTY Left 01/08/2024   Procedure: LEFT TOTAL KNEE ARTHROPLASTYL;  Surgeon: Ernie Cough, MD;  Location: [facility] ORS;  Service: Orthopedics;  Laterality: Left;   TOTAL KNEE ARTHROPLASTY Right 02/19/2024   Procedure: ARTHROPLASTY, KNEE, TOTAL;  Surgeon: Ernie Cough, MD;  Location: [facility] ORS;  Service: Orthopedics;  Laterality: Right;   TOTAL THYROIDECTOMY  2005   goiter   Patient Active Problem List   Diagnosis Date Noted   Bacteremia 02/27/2024   S/P total knee arthroplasty, right 02/19/2024   S/P total  knee arthroplasty, left 01/08/2024   S/P total knee arthroplasty 01/08/2024   Atypical chest pain 11/23/2023   Preop cardiovascular exam 11/23/2023   Dyslipidemia 11/23/2023   Generalized lymphadenopathy 09/23/2021   B12 deficiency 09/23/2021   Fatigue 09/23/2021   Primary osteoarthritis of both knees 04/29/2021   Ankylosing spondylitis (HCC) 12/30/2018   Asthma in adult without complication 12/30/2018   Chronic diarrhea 12/30/2018   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   Interstitial cystitis 07/03/2016   Pelvic pain in female 07/03/2016   Chondromalacia of knee, 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   BMI 38.0-38.9,adult 02/14/2016   Polyarthralgia 02/14/2016   GERD (gastroesophageal reflux disease) 02/10/2014    PCP: Justus Leita DEL, MD  REFERRING PROVIDER: Patti Rosina SAUNDERS, PA-C  REFERRING DIAG: M17.11 (ICD-10-CM) - Unilateral primary osteoarthritis, right knee   RATIONALE FOR EVALUATION AND TREATMENT: Rehabilitation  THERAPY DIAG: No diagnosis found.  ONSET DATE: R TKA 02/19/24, Hx L TKA 01/08/24  FOLLOW-UP APPT SCHEDULED WITH REFERRING PROVIDER: Yes ; pt f/u with surgeon 03/06/24   SUBJECTIVE:  SUBJECTIVE STATEMENT:  Pt is a 48 year old female known to this clinic with Hx of L TKA 01/08/24 who underwent post-op rehab with good ROM progress. Pt is currently s/p R TKA 02/19/24.   PERTINENT HISTORY: Pt is a 48 year old female known to this clinic with Hx of L TKA 01/08/24 who underwent post-op rehab with good ROM progress. Pt is currently s/p R TKA 02/19/24.   Pt has unfortunately had significant challenges with early post-op phase during both knee replacements. Pt had ED visits on 6/13 and 6/14 related to nausea and weakness following Sx. Infection, CVD, PE, and acute process in abdomen/pelvis ruled out.Pt had  additional hospital admission on 02/27/24 due to bacteremia. She was told infection was possibly due to Foley catheter. Pt is following up with Dr. Justus this Friday to f/u on infection.   Pt had second culture without significant bacterial growth after initiating antibiotic. Patient reports some ongoing dysuria; pt thinks she may have had hematuria. Patient reports bilateral knee tightness/stiffness. Pt was told by acute care PT she is somewhat behind on ROM. Pt reports intermittent buckling into extension with longer stride.       PAIN:   Pain Intensity: Present: 2/10, Best: 2/10, Worst: 6/10 as if week of IE Pain location: Peri-incisional pain Pain quality: aching  Radiating pain: some muscle pain in calf  Swelling: Yes  Numbness/Tingling: Yes; around incision and tibial plateau region  Focal weakness or buckling: Yes; pt buckling into hyperextension Aggravating factors: trying to sleep/lie down, maintaining R knee straight/fully extended Relieving factors: ice, exercise/stretching  24-hour pain behavior: worse in AM, later in evenig  History of prior back, hip, or knee injury, pain, surgery, or therapy: Yes; hx of L TKA 01/08/24, R 02/19/24  Imaging: Yes ; post-op X-ray, swelling noted but no infection ruled in  Prior level of function: Independent Occupational demands: CT tech, X-ray tech; OR and dental imaging; substantial time walking/standing  Hobbies: going to lake - boating; walking dog/walking exercise  Red flags: Negative for personal history of cancer, chills/fever, night sweats, nausea, vomiting, unexplained weight gain/loss, unrelenting pain  PRECAUTIONS: None  WEIGHT BEARING RESTRICTIONS: WBAT  FALLS: Has patient fallen in last 6 months? No  Living Environment Lives with: lives with their spouse and lives with their daughter 48 y/o daughter) Lives in: House/apartment No steps to get into home; one-level home, handicap accessible, shower seat  Patient Goals:  Able to return to work    OBJECTIVE:   Patient Surveys  LEFS = 30/80 = 37.5%  Cognition Patient is oriented to person, place, and time.  Recent memory is intact.  Remote memory is intact.  Attention span and concentration are intact.  Expressive speech is intact.  Patient's fund of knowledge is within normal limits for educational level.    Gross Musculoskeletal Assessment Tremor: None Bulk: Normal Tone: Normal  GAIT: Distance walked: 80 ft Assistive device utilized: Walker - 2 wheeled Level of assistance: SBA Comments: Moderate dec terminal knee extension with swing phase   AROM AROM (Normal range in degrees) AROM 02/27/24  Hip Right Left  Flexion (125)    Extension (15)    Abduction (40)    Adduction     Internal Rotation (45)    External Rotation (45)        Knee    Flexion (135) 86 110  Extension (0) -4 -2      Ankle    Dorsiflexion (20) 8 8  Plantarflexion (50) WNL WNL  Inversion (35)  Eversion (15    (* = pain; Blank rows = not tested)  LE MMT: MMT (out of 5) Right 02/27/24 Left 02/27/24  Hip flexion 4- 4  Hip extension    Hip abduction (seated) 5 5  Hip adduction (seated) 5 5  Hip internal rotation    Hip external rotation    Knee flexion 4- 4  Knee extension 3+ 4+  Ankle dorsiflexion    Ankle plantarflexion    Ankle inversion    Ankle eversion    (* = pain; Blank rows = not tested)  Sensation Deferred  Reflexes Deferred  Palpation Location LEFT  RIGHT           Quadriceps    Medial Hamstrings    Lateral Hamstrings    Lateral Hamstring tendon    Medial Hamstring tendon    Quadriceps tendon    Patella    Patellar Tendon    Tibial Tuberosity    Medial joint line    Lateral joint line    MCL    LCL    Adductor Tubercle    Pes Anserine tendon    Infrapatellar fat pad    Fibular head    Popliteal fossa    (Blank rows = not tested) Graded on 0-4 scale (0 = no pain, 1 = pain, 2 = pain with wincing/grimacing/flinching, 3 =  pain with withdrawal, 4 = unwilling to allow palpation), (Blank rows = not tested)  Passive Accessory Motion Deferred   VASCULAR Dorsalis pedis and posterior tibial pulses are palpable No sign of acute DVT Homan's sign: Negative bilat     TODAY'S TREATMENT:   PATIENT EDUCATION:  Education details: *** Person educated: {Person educated:25204} Education method: {Education Method:25205} Education comprehension: {Education Comprehension:25206}   HOME EXERCISE PROGRAM:    ASSESSMENT:  CLINICAL IMPRESSION: Patient is a *** y.o. *** who was seen today for physical therapy evaluation and treatment for ***.   OBJECTIVE IMPAIRMENTS: {opptimpairments:25111}.   ACTIVITY LIMITATIONS: {activitylimitations:27494}  PARTICIPATION LIMITATIONS: {participationrestrictions:25113}  PERSONAL FACTORS: {Personal factors:25162} are also affecting patient's functional outcome.   REHAB POTENTIAL: {rehabpotential:25112}  CLINICAL DECISION MAKING: {clinical decision making:25114}  EVALUATION COMPLEXITY: {Evaluation complexity:25115}   GOALS: Goals reviewed with patient? Yes  SHORT TERM GOALS: Target date: 03/20/2024  Pt will be independent with HEP to improve strength and decrease knee pain to improve pain-free function at home and work. Baseline: 02/27/24: Baseline HEP reviewed.  Goal status: INITIAL   LONG TERM GOALS: Target date: 04/24/2024  Pt will increase FOTO to at least *** to demonstrate significant improvement in function at home and work related to knee pain  Baseline:  Goal status: INITIAL  2.  Pt will decrease worst knee pain by at least 3 points on the NPRS in order to demonstrate clinically significant reduction in knee pain. Baseline: *** Goal status: INITIAL  3.  Pt will decrease LEFS score by at least 9 points in order demonstrate clinically significant reduction in knee pain/disability.       Baseline: *** Goal status: INITIAL  4.  Pt will increase strength of  *** by at least 1/2 MMT grade in order to demonstrate improvement in strength and function  Baseline: *** Goal status: INITIAL   PLAN: PT FREQUENCY: 1-2x/week  PT DURATION: 6-8 weeks  PLANNED INTERVENTIONS: Therapeutic exercises, Therapeutic activity, Neuromuscular re-education, Balance training, Gait training, Patient/Family education, Self Care, Joint mobilization, Joint manipulation, Vestibular training, Canalith repositioning, Orthotic/Fit training, DME instructions, Dry Needling, Electrical stimulation, Spinal manipulation, Spinal mobilization, Cryotherapy,  Moist heat, Taping, Traction, Ultrasound, Ionotophoresis 4mg /ml Dexamethasone , Manual therapy, and Re-evaluation.  PLAN FOR NEXT SESSION: ***   Venetia Endo, PT, DPT [phone]  Venetia ONEIDA Endo, PT 03/04/2024, 6:16 AM

## 2024-03-04 NOTE — Telephone Encounter (Signed)
 Please review patient message. She does have appt on Friday to discuss with you.  JM

## 2024-03-05 ENCOUNTER — Encounter: Payer: Self-pay | Admitting: Physical Therapy

## 2024-03-05 NOTE — Therapy (Deleted)
 OUTPATIENT PHYSICAL THERAPY POST-OP TREATMENT   Patient Name: Hailey [name] MRN: [id number] DOB:May 09, 1976, 48 y.o., female Today's Date:03/05/2024   END OF SESSION:    Past Medical History:  Diagnosis Date   Allergic state    Allergy    Ankylosing spondylitis (HCC)    followed by rheumatology Dr. Tobie   Arthritis    Asthma    Cervical disc disease    Chest pain    Depression    Enlarged lymph nodes    GERD (gastroesophageal reflux disease)    H/O colonoscopy    HPV (human papilloma virus) infection    Hypothyroidism    Interstitial cystitis    LGSIL (low grade squamous intraepithelial dysplasia)    Paresthesia    Pre-diabetes    Status post laparoscopic cholecystectomy 07/13/2016   Past Surgical History:  Procedure Laterality Date   BREAST BIOPSY Right 10/18/2015   bx/clip- neg   CHOLECYSTECTOMY N/A 07/07/2016   Procedure: LAPAROSCOPIC CHOLECYSTECTOMY;  Surgeon: Aloysius Plant, MD;  Location: [facility] ORS;  Service: General;  Laterality: N/A;   COLONOSCOPY WITH PROPOFOL  N/A 10/11/2015   Procedure: COLONOSCOPY WITH PROPOFOL ;  Surgeon: Gladis RAYMOND Mariner, MD;  Location: [facility] ENDOSCOPY;  Service: Endoscopy;  Laterality: N/A;   ESOPHAGOGASTRODUODENOSCOPY (EGD) WITH PROPOFOL  N/A 10/11/2015   Procedure: ESOPHAGOGASTRODUODENOSCOPY (EGD) WITH PROPOFOL ;  Surgeon: Gladis RAYMOND Mariner, MD;  Location: [facility] ENDOSCOPY;  Service: Endoscopy;  Laterality: N/A;   TONSILLECTOMY     TOTAL KNEE ARTHROPLASTY Left 01/08/2024   Procedure: LEFT TOTAL KNEE ARTHROPLASTYL;  Surgeon: Ernie Cough, MD;  Location: [facility] ORS;  Service: Orthopedics;  Laterality: Left;   TOTAL KNEE ARTHROPLASTY Right 02/19/2024   Procedure: ARTHROPLASTY, KNEE, TOTAL;  Surgeon: Ernie Cough, MD;  Location: [facility] ORS;  Service: Orthopedics;  Laterality: Right;   TOTAL THYROIDECTOMY  2005   goiter   Patient Active Problem List   Diagnosis Date Noted   Bacteremia 02/27/2024   S/P total knee arthroplasty, right 02/19/2024   S/P  total knee arthroplasty, left 01/08/2024   S/P total knee arthroplasty 01/08/2024   Atypical chest pain 11/23/2023   Preop cardiovascular exam 11/23/2023   Dyslipidemia 11/23/2023   Generalized lymphadenopathy 09/23/2021   B12 deficiency 09/23/2021   Fatigue 09/23/2021   Primary osteoarthritis of both knees 04/29/2021   Ankylosing spondylitis (HCC) 12/30/2018   Asthma in adult without complication 12/30/2018   Chronic diarrhea 12/30/2018   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   Interstitial cystitis 07/03/2016   Pelvic pain in female 07/03/2016   Chondromalacia of knee, 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   BMI 38.0-38.9,adult 02/14/2016   Polyarthralgia 02/14/2016   GERD (gastroesophageal reflux disease) 02/10/2014    PCP: Justus Leita DEL, MD  REFERRING PROVIDER: Patti Rosina SAUNDERS, PA-C  REFERRING DIAG: M17.11 (ICD-10-CM) - Unilateral primary osteoarthritis, right knee   RATIONALE FOR EVALUATION AND TREATMENT: Rehabilitation  THERAPY DIAG: Acute postoperative pain of right knee  Stiffness of right knee, not elsewhere classified  Difficulty in walking, not elsewhere classified  Muscle weakness (generalized)  ONSET DATE: R TKA 02/19/24, Hx L TKA 01/08/24  FOLLOW-UP APPT SCHEDULED WITH REFERRING PROVIDER: Yes ; pt f/u with surgeon 03/06/24   SUBJECTIVE:  SUBJECTIVE STATEMENT:  Pt is a 48 year old female known to this clinic with Hx of L TKA 01/08/24 who underwent post-op rehab with good ROM progress. Pt is currently s/p R TKA 02/19/24.   PERTINENT HISTORY: Pt is a 48 year old female known to this clinic with Hx of L TKA 01/08/24 who underwent post-op rehab with good ROM progress. Pt is currently s/p R TKA 02/19/24.   Pt has unfortunately had significant challenges with early post-op phase during both knee  replacements. Pt had ED visits on 6/13 and 6/14 related to nausea and weakness following Sx. Infection, CVD, PE, and acute process in abdomen/pelvis ruled out. Pt had additional hospital admission on 02/27/24 due to bacteremia. She was told infection was possibly due to Foley catheter. Pt is following up with Dr. Justus this Friday to f/u on infection status.   Pt had second culture without significant bacterial growth after initiating antibiotic. Patient reports some ongoing dysuria; pt thinks she may have had hematuria. Patient reports bilateral knee tightness/stiffness. Pt was told by acute care PT she is somewhat behind on ROM. Pt reports intermittent buckling into extension with longer stride. No concerning S&S along post-op incision.    PAIN:   Pain Intensity: Present: 2/10, Best: 2/10, Worst: 6/10 as if week of IE Pain location: Peri-incisional pain Pain quality: aching  Radiating pain: some muscle pain in calf  Swelling: Yes  Numbness/Tingling: Yes; around incision and tibial plateau region  Focal weakness or buckling: Yes; pt buckling into hyperextension Aggravating factors: trying to sleep/lie down, maintaining R knee straight/fully extended Relieving factors: ice, exercise/stretching  24-hour pain behavior: worse in AM, later in evenig  History of prior back, hip, or knee injury, pain, surgery, or therapy: Yes; hx of L TKA 01/08/24, R 02/19/24  Imaging: Yes ; post-op X-ray, swelling noted but no infection ruled in  Prior level of function: Independent Occupational demands: CT tech, X-ray tech; OR and dental imaging; substantial time walking/standing  Hobbies: going to lake - boating; walking dog/walking exercise  Red flags: Negative for personal history of cancer, chills/fever, night sweats, nausea, vomiting, unexplained weight gain/loss, unrelenting pain  PRECAUTIONS: None  WEIGHT BEARING RESTRICTIONS: WBAT  FALLS: Has patient fallen in last 6 months? No  Living  Environment Lives with: lives with their spouse and lives with their daughter 48 y/o daughter) Lives in: House/apartment No steps to get into home; one-level home, handicap accessible, shower seat  Patient Goals: Able to return to work    OBJECTIVE:   Patient Surveys  LEFS = 30/80 = 37.5%  Cognition Patient is oriented to person, place, and time.  Recent memory is intact.  Remote memory is intact.  Attention span and concentration are intact.  Expressive speech is intact.  Patient's fund of knowledge is within normal limits for educational level.    Gross Musculoskeletal Assessment Tremor: None Bulk: Atrophy of R>L quadriceps, moderate Tone: Normal  GAIT: Distance walked: 80 ft Assistive device utilized: Walker - 2 wheeled Level of assistance: SBA Comments: Moderate dec terminal knee extension with swing phase   AROM AROM (Normal range in degrees) AROM 03/04/24  Hip Right Left  Flexion (125)    Extension (15)    Abduction (40)    Adduction     Internal Rotation (45)    External Rotation (45)        Knee    Flexion (135) 86 110  Extension (0) -4 -2      Ankle    Dorsiflexion (20) 8 8  Plantarflexion (50) WNL WNL  Inversion (35)    Eversion (15    (* = pain; Blank rows = not tested)  LE MMT: MMT (out of 5) Right 03/04/24 Left 03/04/24  Hip flexion 4- 4  Hip extension    Hip abduction (seated) 5 5  Hip adduction (seated) 5 5  Hip internal rotation    Hip external rotation    Knee flexion 4- 4  Knee extension 3+ 4+  Ankle dorsiflexion    Ankle plantarflexion    Ankle inversion    Ankle eversion    (* = pain; Blank rows = not tested)  Sensation Deferred  Reflexes Deferred  Palpation Location LEFT  RIGHT           Quadriceps  1  Medial Hamstrings    Lateral Hamstrings    Lateral Hamstring tendon    Medial Hamstring tendon    Quadriceps tendon    Patella  1  Patellar Tendon  1  Tibial Tuberosity  2  Medial joint line 1 2  Lateral joint  line 1 1  MCL    LCL    Adductor Tubercle    Pes Anserine tendon    Infrapatellar fat pad    Fibular head    Popliteal fossa    (Blank rows = not tested) Graded on 0-4 scale (0 = no pain, 1 = pain, 2 = pain with wincing/grimacing/flinching, 3 = pain with withdrawal, 4 = unwilling to allow palpation), (Blank rows = not tested)  Passive Accessory Motion Deferred   VASCULAR Dorsalis pedis and posterior tibial pulses are palpable No sign of acute DVT Homan's sign: Negative bilat     TODAY'S TREATMENT:   SUBJECTIVE STATEMENT:       Manual Therapy - for symptom modulation, soft tissue sensitivity and mobility, joint mobility, ROM   STM/DTM    Therapeutic Exercise - for improved soft tissue flexibility and extensibility as needed for ROM, improved strength as needed to improve performance of CKC activities/functional movements       PATIENT EDUCATION:  Education details: see above for patient education details Person educated: Patient Education method: Explanation, Demonstration, and Handouts Education comprehension: verbalized understanding and returned demonstration   HOME EXERCISE PROGRAM:  Pt continuing with established home exercises from TKA packet  -pt instructed on propping knee for gravity-assisted extension stretch   ASSESSMENT:  CLINICAL IMPRESSION: Patient is a 48 y.o. female who was seen today for physical therapy evaluation and treatment s/p R TKA 02/19/24; pt has Hx of L TKA 01/08/24. L knee has mild deficits remaining with flexion/extension ROM. Pt has expected post-operative impairments in R knee ROM, stiffness, edema, gait deviations, anterior knee pain. Patient is still undergoing follow-up for bacteremia from suspected catheter-associated UTI. We will continue monitoring for any signs of infection along post-operative incision; patient's most recent culture fortunately did not have significant growth. 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 Pt will continue to benefit from skilled PT services to address deficits and improve function.   OBJECTIVE IMPAIRMENTS: Abnormal gait, decreased activity tolerance, decreased mobility, difficulty walking, decreased ROM, decreased strength, hypomobility, impaired flexibility, and pain.   ACTIVITY LIMITATIONS: carrying, lifting, sitting, standing, squatting, stairs, transfers, bed mobility, and locomotion level  PARTICIPATION LIMITATIONS: meal prep, cleaning, driving, shopping, community activity, and occupation  PERSONAL FACTORS: Past/current experiences and 3+ comorbidities: (currently being treated for bacteremia, ankylosing spondylitis, polyarthralgia, dyslipidemia) are also affecting patient's functional outcome.  REHAB POTENTIAL: Good  CLINICAL DECISION MAKING: Evolving/moderate complexity  EVALUATION COMPLEXITY: Moderate   GOALS: Goals reviewed with patient? Yes  SHORT TERM GOALS: Target date: 03/20/2024  Pt will be independent with HEP to improve strength and decrease knee pain to improve pain-free function at home and work. Baseline: 03/04/24: Pt continuing established HEP from hospital/TKA packet; discussed knee extension propped stretch and frequent work on heel slides.  Goal status: INITIAL   LONG TERM GOALS: Target date: 04/24/2024  Pt will have bilateral knee AROM 0-115 deg at least as needed for performance of ADLs and normalized extension for gait pattern Baseline: 03/04/24: Knee AROM R -4-86, L -2-110 Goal status: INITIAL  2.  Pt will decrease worst knee pain by at least 3 points on the NPRS in order to demonstrate clinically significant reduction in knee pain. Baseline: 03/04/24: 6/10 pain at worst.  Goal status: INITIAL  3.  Pt will decrease LEFS score by at least 9 points in order demonstrate clinically significant reduction in knee pain/disability.       Baseline: 03/04/24: 30/80  Goal status:  INITIAL  4.  Pt will increase strength of bilateral hip flexors and quads/hamstrings to at least 4+/5 or greater MMT grade in order to demonstrate improvement in strength and function needed for transferring and improved lower limb stability.,  Baseline: 03/04/24: MMT 3+ to 4 for aforementioned muscle groups.  Goal status: INITIAL  5.  Pt will ambulate at community level (660 feet or greater in office) with no AD and symmetrical weightbearing on either LE without major deviations as needed for return to independent mobility and return to work Baseline: 03/04/24: Limited distance tolerated for walking s/p R TKA. Goal status: INITIAL   PLAN: PT FREQUENCY: 1-2x/week  PT DURATION: 6-8 weeks  PLANNED INTERVENTIONS: Therapeutic exercises, Therapeutic activity, Neuromuscular re-education, Balance training, Gait training, Patient/Family education, Self Care, Joint mobilization, DME instructions, Dry Needling, Electrical stimulation, Cryotherapy, Moist heat, Taping, Manual therapy, and Re-evaluation.  PLAN FOR NEXT SESSION: Knee ROM, quad/HS/hip strengthening, progressive weightbearing and stepping drills to work on weaning toward LRAD and then to no AD.    Venetia Endo, PT, DPT #[id number]  Venetia ONEIDA Endo, PT 03/05/2024, 9:55 PM

## 2024-03-05 NOTE — Telephone Encounter (Signed)
 Please review and advise. Patient does have appt with surgeon tomorrow as well.  JM

## 2024-03-06 ENCOUNTER — Other Ambulatory Visit: Payer: Self-pay

## 2024-03-06 ENCOUNTER — Emergency Department (HOSPITAL_COMMUNITY)
Admission: EM | Admit: 2024-03-06 | Discharge: 2024-03-06 | Disposition: A | Discharge status: Home / Self Care | Attending: Emergency Medicine | Admitting: Emergency Medicine

## 2024-03-06 ENCOUNTER — Ambulatory Visit: Admitting: Physical Therapy

## 2024-03-06 DIAGNOSIS — Z7982 Long term (current) use of aspirin: Secondary | ICD-10-CM | POA: Diagnosis not present

## 2024-03-06 DIAGNOSIS — R1030 Lower abdominal pain, unspecified: Secondary | ICD-10-CM | POA: Insufficient documentation

## 2024-03-06 DIAGNOSIS — R6883 Chills (without fever): Secondary | ICD-10-CM | POA: Insufficient documentation

## 2024-03-06 DIAGNOSIS — Z91040 Latex allergy status: Secondary | ICD-10-CM | POA: Insufficient documentation

## 2024-03-06 DIAGNOSIS — Z96651 Presence of right artificial knee joint: Secondary | ICD-10-CM

## 2024-03-06 DIAGNOSIS — R262 Difficulty in walking, not elsewhere classified: Secondary | ICD-10-CM

## 2024-03-06 DIAGNOSIS — R110 Nausea: Secondary | ICD-10-CM | POA: Insufficient documentation

## 2024-03-06 DIAGNOSIS — M25561 Pain in right knee: Secondary | ICD-10-CM

## 2024-03-06 DIAGNOSIS — R102 Pelvic and perineal pain: Secondary | ICD-10-CM

## 2024-03-06 DIAGNOSIS — M25661 Stiffness of right knee, not elsewhere classified: Secondary | ICD-10-CM

## 2024-03-06 DIAGNOSIS — M6281 Muscle weakness (generalized): Secondary | ICD-10-CM

## 2024-03-06 LAB — CBC WITH DIFFERENTIAL/PLATELET
Abs Immature Granulocytes: 0.02 10*3/uL (ref 0.00–0.07)
Basophils Absolute: 0.1 10*3/uL (ref 0.0–0.1)
Basophils Relative: 1 %
Eosinophils Absolute: 0.6 10*3/uL — ABNORMAL HIGH (ref 0.0–0.5)
Eosinophils Relative: 5 %
HCT: 37.5 % (ref 36.0–46.0)
Hemoglobin: 11.9 g/dL — ABNORMAL LOW (ref 12.0–15.0)
Immature Granulocytes: 0 %
Lymphocytes Relative: 23 %
Lymphs Abs: 2.5 10*3/uL (ref 0.7–4.0)
MCH: 28.1 pg (ref 26.0–34.0)
MCHC: 31.7 g/dL (ref 30.0–36.0)
MCV: 88.7 fL (ref 80.0–100.0)
Monocytes Absolute: 0.7 10*3/uL (ref 0.1–1.0)
Monocytes Relative: 7 %
Neutro Abs: 6.9 10*3/uL (ref 1.7–7.7)
Neutrophils Relative %: 64 %
Platelets: 477 10*3/uL — ABNORMAL HIGH (ref 150–400)
RBC: 4.23 MIL/uL (ref 3.87–5.11)
RDW: 14.6 % (ref 11.5–15.5)
WBC: 10.9 10*3/uL — ABNORMAL HIGH (ref 4.0–10.5)
nRBC: 0 % (ref 0.0–0.2)

## 2024-03-06 LAB — URINALYSIS, W/ REFLEX TO CULTURE (INFECTION SUSPECTED)
Bacteria, UA: NONE SEEN
Bilirubin Urine: NEGATIVE
Glucose, UA: NEGATIVE mg/dL
Hgb urine dipstick: NEGATIVE
Ketones, ur: NEGATIVE mg/dL
Leukocytes,Ua: NEGATIVE
Nitrite: NEGATIVE
Protein, ur: NEGATIVE mg/dL
Specific Gravity, Urine: 1.016 (ref 1.005–1.030)
pH: 5 (ref 5.0–8.0)

## 2024-03-06 LAB — COMPREHENSIVE METABOLIC PANEL WITH GFR
ALT: 18 U/L (ref 0–44)
AST: 17 U/L (ref 15–41)
Albumin: 4.2 g/dL (ref 3.5–5.0)
Alkaline Phosphatase: 126 U/L (ref 38–126)
Anion gap: 11 (ref 5–15)
BUN: 16 mg/dL (ref 6–20)
CO2: 24 mmol/L (ref 22–32)
Calcium: 9.7 mg/dL (ref 8.9–10.3)
Chloride: 101 mmol/L (ref 98–111)
Creatinine, Ser: 0.65 mg/dL (ref 0.44–1.00)
GFR, Estimated: >60 mL/min (ref >60)
Glucose, Bld: 137 mg/dL — ABNORMAL HIGH (ref 70–99)
Potassium: 4.3 mmol/L (ref 3.5–5.1)
Sodium: 136 mmol/L (ref 135–145)
Total Bilirubin: 0.7 mg/dL (ref 0.0–1.2)
Total Protein: 7.9 g/dL (ref 6.5–8.1)

## 2024-03-06 LAB — I-STAT CG4 LACTIC ACID, ED: Lactic Acid, Venous: 1 mmol/L (ref 0.5–1.9)

## 2024-03-06 MED ORDER — HYDROMORPHONE HCL 2 MG PO TABS
2.0000 mg | ORAL_TABLET | ORAL | 0 refills | Status: DC | PRN
  Filled 2024-03-06: qty 42, 7d supply, fill #0

## 2024-03-06 MED ORDER — ONDANSETRON 4 MG PO TBDP
4.0000 mg | ORAL_TABLET | Freq: Once | ORAL | Status: AC | PRN
  Administered 2024-03-06: 4 mg via ORAL
  Filled 2024-03-06: qty 1

## 2024-03-06 MED ORDER — AMOXICILLIN-POT CLAVULANATE 875-125 MG PO TABS
1.0000 | ORAL_TABLET | Freq: Two times a day (BID) | ORAL | 0 refills | Status: AC
  Filled 2024-03-06: qty 14, 7d supply, fill #0

## 2024-03-06 MED ORDER — FAMOTIDINE 20 MG PO TABS
20.0000 mg | ORAL_TABLET | Freq: Two times a day (BID) | ORAL | 0 refills | Status: DC
  Filled 2024-03-06: qty 30, 15d supply, fill #0

## 2024-03-06 MED ORDER — PROMETHAZINE HCL 25 MG PO TABS
25.0000 mg | ORAL_TABLET | Freq: Three times a day (TID) | ORAL | 0 refills | Status: DC | PRN
  Filled 2024-03-06: qty 30, 10d supply, fill #0

## 2024-03-06 NOTE — ED Notes (Signed)
 Patient states she tried eating an egg mc muffin her husband brought her-states she was only able to eat a couple bites and now feels nauseous

## 2024-03-06 NOTE — ED Provider Notes (Signed)
 [facility] Provider Note   CSN: [id number] Arrival date & time: 03/06/24  0243     Patient presents with: Nausea   Hailey MENSER is a 48 y.o. female.   Pt c/o nausea, suprapubic area discomfort, and had a chill earlier. Indicates is taking oral antibiotic for recent positive blood culture - had admission then. 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 Indicates knees doing well, no new swelling or redness or new or worsening pain.  Indicates compliant w meds. Has appt with her orthopedist/Dr Ernie, this afternoon around 245. No fevers. No sweats. Indicates history interstitial cystitis, and has had similar suprapubic pain from that in past. No hematuria. No new or severe dysuria or frequency. No vaginal discharge or bleeding. No uri symptoms, no sore throat, cough, sinus pain, or headaches. No midline neck or back pain. No new focal extremity pain or swelling. No rash or skin lesions.   The history is provided by the patient and medical records.       Prior to Admission medications   Medication Sig Start Date End Date Taking? Authorizing Provider  albuterol  (VENTOLIN  HFA) 108 (90 Base) MCG/ACT inhaler Inhale 2 puffs into the lungs every 6 (six) hours as needed for wheezing or shortness of breath. 10/12/23   Berglund, Laura H, MD  amoxicillin -clavulanate (AUGMENTIN ) 875-125 MG tablet Take 1 tablet by mouth 2 (two) times daily for 6 days. 03/01/24 03/07/24  Drusilla Sabas GORMAN, MD  aspirin  81 MG chewable tablet Chew 1 tablet (81 mg total) by mouth 2 (two) times daily. 02/18/24     celecoxib  (CELEBREX ) 200 MG capsule Take 1 capsule (200 mg total) by mouth 2 (two) times daily with a meal. 12/24/23     clotrimazole  (GYNE-LOTRIMIN ) 1 % vaginal cream Place 1 Applicatorful vaginally at bedtime for 5 days. 03/01/24 03/06/24  Drusilla Sabas GORMAN, MD  fluticasone -salmeterol (ADVAIR  DISKUS) 100-50 MCG/ACT AEPB Inhale 1 puff into the lungs 2 (two) times daily as needed. Patient  taking differently: Inhale 1 puff into the lungs daily as needed (SOB). 10/12/23   Justus Leita DEL, MD  HYDROmorphone  (DILAUDID ) 2 MG tablet Take 2 mg by mouth every 4 (four) hours as needed for severe pain (pain score 7-10).    [provider]  methocarbamol  (ROBAXIN ) 500 MG tablet Take 1 tablet (500 mg total) by mouth every 6 (six) hours as needed. Patient not taking: Reported on 02/28/2024 12/24/23     metoCLOPramide  (REGLAN ) 10 MG tablet Take 1 tablet (10 mg total) by mouth every 6 (six) hours as needed for nausea. Patient not taking: Reported on 02/28/2024 02/22/24   Bernis Ernst, PA-C  Multiple Vitamin (MULTIVITAMIN WITH MINERALS) TABS tablet Take 1 tablet by mouth daily. Patient not taking: Reported on 02/28/2024    [provider]  ondansetron  (ZOFRAN -ODT) 4 MG disintegrating tablet Take 1 tablet by mouth every 6 hours as needed for nausea/vomiting 02/18/24     oxyCODONE  (OXY IR/ROXICODONE ) 5 MG immediate release tablet Take 1 tablet (5 mg total) by mouth every 4 (four) hours as needed for severe pain. Patient not taking: Reported on 02/28/2024 02/18/24     polyethylene glycol powder (GLYCOLAX /MIRALAX ) 17 GM/SCOOP powder Mix 1 capful (17 grams) in liquid and drink by mouth 2 (two) times daily. 02/18/24     promethazine  (PHENERGAN ) 25 MG tablet Take 1 tablet (25 mg total) by mouth every 6 (six) hours as needed for nausea or vomiting. 02/23/24   Ruthe Cornet,  DO  SYNTHROID  200 MCG tablet Take 1 tablet (200 mcg total) by mouth once daily. Take on an empty stomach with a glass of water  at least 30-60 minutes before breakfast. 06/18/23     tiZANidine  (ZANAFLEX ) 4 MG tablet Take 1 tablet (4 mg total) by mouth at bedtime as needed for muscle spasm or pain Patient not taking: Reported on 02/28/2024 02/13/24       Allergies: Levofloxacin, Lodine [etodolac], Adalimumab , Ciprofloxacin, and Latex    Review of Systems  Constitutional:  Positive for chills. Negative for diaphoresis and fever.   HENT:  Negative for sinus pain and sore throat.   Respiratory:  Negative for cough and shortness of breath.   Cardiovascular:  Negative for chest pain and leg swelling.  Gastrointestinal:  Negative for abdominal pain, diarrhea and vomiting.  Genitourinary:  Negative for flank pain.  Musculoskeletal:  Negative for back pain and neck pain.  Skin:  Negative for rash.  Neurological:  Negative for headaches.    Updated Vital Signs BP 126/76   Pulse 77   Temp 97.7 F (36.5 C)   Resp 17   LMP 11/27/2023   SpO2 96%   Physical Exam Vitals and nursing note reviewed.  Constitutional:      Appearance: Normal appearance. She is well-developed.  HENT:     Head: Atraumatic.     Nose: Nose normal.     Mouth/Throat:     Mouth: Mucous membranes are moist.     Pharynx: Oropharynx is clear. No oropharyngeal exudate or posterior oropharyngeal erythema.   Eyes:     General: No scleral icterus.    Conjunctiva/sclera: Conjunctivae normal.     Pupils: Pupils are equal, round, and reactive to light.   Neck:     Trachea: No tracheal deviation.     Comments: No neck stiffness or rigidity.  Cardiovascular:     Rate and Rhythm: Normal rate and regular rhythm.     Pulses: Normal pulses.     Heart sounds: Normal heart sounds. No murmur heard.    No friction rub. No gallop.  Pulmonary:     Effort: Pulmonary effort is normal. No respiratory distress.     Breath sounds: Normal breath sounds.  Abdominal:     General: Bowel sounds are normal. There is no distension.     Palpations: Abdomen is soft. There is no mass.     Tenderness: There is no abdominal tenderness. There is no guarding or rebound.  Genitourinary:    Comments: No cva tenderness.   Musculoskeletal:        General: No swelling or tenderness.     Cervical back: Normal range of motion and neck supple. No rigidity. No muscular tenderness.     Comments: Bil knees with surgical scars healing well, without sign of infection. Bil  knees/legs are of normal color and warmth, no asymmetric swelling. Distal pulses palp. Good passive rom knees without pain. No large effusions noted.   Lymphadenopathy:     Cervical: No cervical adenopathy.   Skin:    General: Skin is warm and dry.     Findings: No rash.   Neurological:     Mental Status: She is alert.     Comments: Alert, speech normal. Motor/sens grossly intact bil.   Psychiatric:        Mood and Affect: Mood normal.     (all labs ordered are listed, but only abnormal results are displayed) Results for orders placed or performed during the hospital  encounter of 03/06/24  Comprehensive metabolic panel   Collection Time: 03/06/24  3:19 AM  Result Value Ref Range   Sodium 136 135 - 145 mmol/L   Potassium 4.3 3.5 - 5.1 mmol/L   Chloride 101 98 - 111 mmol/L   CO2 24 22 - 32 mmol/L   Glucose, Bld 137 (H) 70 - 99 mg/dL   BUN 16 6 - 20 mg/dL   Creatinine, Ser 9.34 0.44 - 1.00 mg/dL   Calcium 9.7 8.9 - 89.6 mg/dL   Total Protein 7.9 6.5 - 8.1 g/dL   Albumin 4.2 3.5 - 5.0 g/dL   AST 17 15 - 41 U/L   ALT 18 0 - 44 U/L   Alkaline Phosphatase 126 38 - 126 U/L   Total Bilirubin 0.7 0.0 - 1.2 mg/dL   GFR, Estimated >39 >39 mL/min   Anion gap 11 5 - 15  CBC with Differential   Collection Time: 03/06/24  3:19 AM  Result Value Ref Range   WBC 10.9 (H) 4.0 - 10.5 K/uL   RBC 4.23 3.87 - 5.11 MIL/uL   Hemoglobin 11.9 (L) 12.0 - 15.0 g/dL   HCT 62.4 63.9 - 53.9 %   MCV 88.7 80.0 - 100.0 fL   MCH 28.1 26.0 - 34.0 pg   MCHC 31.7 30.0 - 36.0 g/dL   RDW 85.3 88.4 - 84.4 %   Platelets 477 (H) 150 - 400 K/uL   nRBC 0.0 0.0 - 0.2 %   Neutrophils Relative % 64 %   Neutro Abs 6.9 1.7 - 7.7 K/uL   Lymphocytes Relative 23 %   Lymphs Abs 2.5 0.7 - 4.0 K/uL   Monocytes Relative 7 %   Monocytes Absolute 0.7 0.1 - 1.0 K/uL   Eosinophils Relative 5 %   Eosinophils Absolute 0.6 (H) 0.0 - 0.5 K/uL   Basophils Relative 1 %   Basophils Absolute 0.1 0.0 - 0.1 K/uL   Immature  Granulocytes 0 %   Abs Immature Granulocytes 0.02 0.00 - 0.07 K/uL  I-Stat Lactic Acid, ED   Collection Time: 03/06/24  3:24 AM  Result Value Ref Range   Lactic Acid, Venous 1.0 0.5 - 1.9 mmol/L  Urinalysis, w/ Reflex to Culture (Infection Suspected) -Urine, Clean Catch   Collection Time: 03/06/24  4:17 AM  Result Value Ref Range   Specimen Source URINE, CLEAN CATCH    Color, Urine YELLOW YELLOW   APPearance CLEAR CLEAR   Specific Gravity, Urine 1.016 1.005 - 1.030   pH 5.0 5.0 - 8.0   Glucose, UA NEGATIVE NEGATIVE mg/dL   Hgb urine dipstick NEGATIVE NEGATIVE   Bilirubin Urine NEGATIVE NEGATIVE   Ketones, ur NEGATIVE NEGATIVE mg/dL   Protein, ur NEGATIVE NEGATIVE mg/dL   Nitrite NEGATIVE NEGATIVE   Leukocytes,Ua NEGATIVE NEGATIVE   RBC / HPF 0-5 0 - 5 RBC/hpf   WBC, UA 0-5 0 - 5 WBC/hpf   Bacteria, UA NONE SEEN NONE SEEN   Squamous Epithelial / HPF 0-5 0 - 5 /HPF   DG Knee 1-2 Views Right Result Date: 02/29/2024 CLINICAL DATA:  Infection EXAM: RIGHT KNEE - 1-2 VIEW COMPARISON:  None Available. FINDINGS: Total knee arthroplasty is present. There is no acute fracture or cortical erosion identified. There is soft tissue swelling of the anterior knee with likely joint effusion. IMPRESSION: 1. Total knee arthroplasty. No acute fracture or cortical erosion identified. 2. Soft tissue swelling of the anterior knee with likely joint effusion. Infection not excluded. Electronically Signed   By:  Greig Pique M.D.   On: 02/29/2024 17:11   CT Angio Chest PE W and/or Wo Contrast Result Date: 02/23/2024 CLINICAL DATA:  Pulmonary embolism suspected, high probability. Abdominal pain, weakness, and shaking. EXAM: CT ANGIOGRAPHY CHEST CT ABDOMEN AND PELVIS WITH CONTRAST TECHNIQUE: Multidetector CT imaging of the chest was performed using the standard protocol during bolus administration of intravenous contrast. Multiplanar CT image reconstructions and MIPs were obtained to evaluate the vascular anatomy.  Multidetector CT imaging of the abdomen and pelvis was performed using the standard protocol during bolus administration of intravenous contrast. RADIATION DOSE REDUCTION: This exam was performed according to the departmental dose-optimization program which includes automated exposure control, adjustment of the mA and/or kV according to patient size and/or use of iterative reconstruction technique. CONTRAST:  75mL OMNIPAQUE  IOHEXOL  350 MG/ML SOLN COMPARISON:  11/29/2023, 02/22/2024. FINDINGS: CTA CHEST FINDINGS Cardiovascular: The heart is normal in size and there is a trace pericardial effusion. The aorta at and pulmonary trunk are normal in caliber. Evaluation of segmental and subsegmental pulmonary arteries is limited due to suboptimal opacification. No large central pulmonary embolus is seen. Mediastinum/Nodes: No mediastinal, hilar, or axillary lymphadenopathy is seen. Surgical clips are present in the thyroid  bed. The trachea and esophagus are within normal limits. Lungs/Pleura: No consolidation, effusion, or pneumothorax is seen. A stable pulmonary cyst is present in the left lower lobe. Musculoskeletal: No acute osseous abnormality. Review of the MIP images confirms the above findings. CT ABDOMEN and PELVIS FINDINGS Hepatobiliary: No focal liver abnormality is seen. Status post cholecystectomy. No biliary dilatation. Pancreas: Unremarkable. No pancreatic ductal dilatation or surrounding inflammatory changes. Spleen: Normal in size without focal abnormality. Adrenals/Urinary Tract: The adrenal glands are within normal limits. The kidneys enhance symmetrically. No renal or ureteral calculus or obstructive uropathy bilaterally. The bladder is unremarkable. Stomach/Bowel: Stomach is within normal limits. Appendix appears normal. No evidence of bowel wall thickening, distention, or inflammatory changes. No free air or pneumatosis. Vascular/Lymphatic: No abdominal or pelvic lymphadenopathy by size criteria.  Reproductive: The uterus is within normal limits. There is a hypodense structure in the right ovary measuring 5.6 cm, unchanged from the prior exam and previously characterized as cyst. No adnexal mass on the left. Other: No abdominopelvic ascites. A fat containing umbilical hernia is present. Musculoskeletal: No acute osseous abnormality. Sclerosis is noted at the sacroiliac joints bilaterally, greater on the left than on the right, compatible with sacroiliitis. Review of the MIP images confirms the above findings. IMPRESSION: 1. No large central pulmonary embolus or other acute process in the chest. 2. No acute process in the abdomen and pelvis. 3. Stable right ovarian cyst. Electronically Signed   By: Leita Birmingham M.D.   On: 02/23/2024 15:59   CT ABDOMEN PELVIS W CONTRAST Result Date: 02/23/2024 CLINICAL DATA:  Pulmonary embolism suspected, high probability. Abdominal pain, weakness, and shaking. EXAM: CT ANGIOGRAPHY CHEST CT ABDOMEN AND PELVIS WITH CONTRAST TECHNIQUE: Multidetector CT imaging of the chest was performed using the standard protocol during bolus administration of intravenous contrast. Multiplanar CT image reconstructions and MIPs were obtained to evaluate the vascular anatomy. Multidetector CT imaging of the abdomen and pelvis was performed using the standard protocol during bolus administration of intravenous contrast. RADIATION DOSE REDUCTION: This exam was performed according to the departmental dose-optimization program which includes automated exposure control, adjustment of the mA and/or kV according to patient size and/or use of iterative reconstruction technique. CONTRAST:  75mL OMNIPAQUE  IOHEXOL  350 MG/ML SOLN COMPARISON:  11/29/2023, 02/22/2024. FINDINGS: CTA CHEST  FINDINGS Cardiovascular: The heart is normal in size and there is a trace pericardial effusion. The aorta at and pulmonary trunk are normal in caliber. Evaluation of segmental and subsegmental pulmonary arteries is limited  due to suboptimal opacification. No large central pulmonary embolus is seen. Mediastinum/Nodes: No mediastinal, hilar, or axillary lymphadenopathy is seen. Surgical clips are present in the thyroid  bed. The trachea and esophagus are within normal limits. Lungs/Pleura: No consolidation, effusion, or pneumothorax is seen. A stable pulmonary cyst is present in the left lower lobe. Musculoskeletal: No acute osseous abnormality. Review of the MIP images confirms the above findings. CT ABDOMEN and PELVIS FINDINGS Hepatobiliary: No focal liver abnormality is seen. Status post cholecystectomy. No biliary dilatation. Pancreas: Unremarkable. No pancreatic ductal dilatation or surrounding inflammatory changes. Spleen: Normal in size without focal abnormality. Adrenals/Urinary Tract: The adrenal glands are within normal limits. The kidneys enhance symmetrically. No renal or ureteral calculus or obstructive uropathy bilaterally. The bladder is unremarkable. Stomach/Bowel: Stomach is within normal limits. Appendix appears normal. No evidence of bowel wall thickening, distention, or inflammatory changes. No free air or pneumatosis. Vascular/Lymphatic: No abdominal or pelvic lymphadenopathy by size criteria. Reproductive: The uterus is within normal limits. There is a hypodense structure in the right ovary measuring 5.6 cm, unchanged from the prior exam and previously characterized as cyst. No adnexal mass on the left. Other: No abdominopelvic ascites. A fat containing umbilical hernia is present. Musculoskeletal: No acute osseous abnormality. Sclerosis is noted at the sacroiliac joints bilaterally, greater on the left than on the right, compatible with sacroiliitis. Review of the MIP images confirms the above findings. IMPRESSION: 1. No large central pulmonary embolus or other acute process in the chest. 2. No acute process in the abdomen and pelvis. 3. Stable right ovarian cyst. Electronically Signed   By: Leita Birmingham M.D.    On: 02/23/2024 15:59   DG Chest 2 View Result Date: 02/23/2024 CLINICAL DATA:  Upper respiratory infection.  Suspected pneumonia. EXAM: CHEST - 2 VIEW COMPARISON:  11/27/2023 FINDINGS: The heart size and mediastinal contours are within normal limits. Both lungs are clear. The visualized skeletal structures are unremarkable. IMPRESSION: No active cardiopulmonary disease. Electronically Signed   By: Norleen DELENA Kil M.D.   On: 02/23/2024 09:25   US  Pelvis Complete Result Date: 02/22/2024 CLINICAL DATA:  Follow-up right ovarian cyst EXAM: TRANSABDOMINAL AND TRANSVAGINAL ULTRASOUND OF PELVIS DOPPLER ULTRASOUND OF OVARIES TECHNIQUE: Both transabdominal and transvaginal ultrasound examinations of the pelvis were performed. Transabdominal technique was performed for global imaging of the pelvis including uterus, ovaries, adnexal regions, and pelvic cul-de-sac. It was necessary to proceed with endovaginal exam following the transabdominal exam to visualize the ovaries. Color and duplex Doppler ultrasound was utilized to evaluate blood flow to the ovaries. COMPARISON:  CT from earlier in the same day. FINDINGS: Uterus Measurements: 7.3 x 3.6 x 4.5 cm. = volume: 61 mL. 1.6 cm fibroid is noted anteriorly on the left. Nabothian cysts are noted within the cervix. Endometrium Thickness: 4 mm.  No focal abnormality visualized. Right ovary Measurements: 5.0 x 4.4 x 4.6 cm. = volume: 52 mL. 4.6 cm cyst is noted similar to that seen on the prior CT examination. No evidence of ovarian torsion is noted. Left ovary Not well visualized Pulsed Doppler evaluation of the right ovary demonstrates normal low-resistance arterial and venous waveforms. Other findings No abnormal free fluid. IMPRESSION: 4.6 cm right ovarian cyst. No evidence of torsion is noted. No follow-up is recommended. Small left uterine fibroid. Electronically Signed  By: Oneil Devonshire M.D.   On: 02/22/2024 22:26   US  Transvaginal Non-OB Result Date:  02/22/2024 CLINICAL DATA:  Follow-up right ovarian cyst EXAM: TRANSABDOMINAL AND TRANSVAGINAL ULTRASOUND OF PELVIS DOPPLER ULTRASOUND OF OVARIES TECHNIQUE: Both transabdominal and transvaginal ultrasound examinations of the pelvis were performed. Transabdominal technique was performed for global imaging of the pelvis including uterus, ovaries, adnexal regions, and pelvic cul-de-sac. It was necessary to proceed with endovaginal exam following the transabdominal exam to visualize the ovaries. Color and duplex Doppler ultrasound was utilized to evaluate blood flow to the ovaries. COMPARISON:  CT from earlier in the same day. FINDINGS: Uterus Measurements: 7.3 x 3.6 x 4.5 cm. = volume: 61 mL. 1.6 cm fibroid is noted anteriorly on the left. Nabothian cysts are noted within the cervix. Endometrium Thickness: 4 mm.  No focal abnormality visualized. Right ovary Measurements: 5.0 x 4.4 x 4.6 cm. = volume: 52 mL. 4.6 cm cyst is noted similar to that seen on the prior CT examination. No evidence of ovarian torsion is noted. Left ovary Not well visualized Pulsed Doppler evaluation of the right ovary demonstrates normal low-resistance arterial and venous waveforms. Other findings No abnormal free fluid. IMPRESSION: 4.6 cm right ovarian cyst. No evidence of torsion is noted. No follow-up is recommended. Small left uterine fibroid. Electronically Signed   By: Oneil Devonshire M.D.   On: 02/22/2024 22:26   US  Art/Ven Flow Abd Pelv Doppler Result Date: 02/22/2024 CLINICAL DATA:  Follow-up right ovarian cyst EXAM: TRANSABDOMINAL AND TRANSVAGINAL ULTRASOUND OF PELVIS DOPPLER ULTRASOUND OF OVARIES TECHNIQUE: Both transabdominal and transvaginal ultrasound examinations of the pelvis were performed. Transabdominal technique was performed for global imaging of the pelvis including uterus, ovaries, adnexal regions, and pelvic cul-de-sac. It was necessary to proceed with endovaginal exam following the transabdominal exam to visualize the  ovaries. Color and duplex Doppler ultrasound was utilized to evaluate blood flow to the ovaries. COMPARISON:  CT from earlier in the same day. FINDINGS: Uterus Measurements: 7.3 x 3.6 x 4.5 cm. = volume: 61 mL. 1.6 cm fibroid is noted anteriorly on the left. Nabothian cysts are noted within the cervix. Endometrium Thickness: 4 mm.  No focal abnormality visualized. Right ovary Measurements: 5.0 x 4.4 x 4.6 cm. = volume: 52 mL. 4.6 cm cyst is noted similar to that seen on the prior CT examination. No evidence of ovarian torsion is noted. Left ovary Not well visualized Pulsed Doppler evaluation of the right ovary demonstrates normal low-resistance arterial and venous waveforms. Other findings No abnormal free fluid. IMPRESSION: 4.6 cm right ovarian cyst. No evidence of torsion is noted. No follow-up is recommended. Small left uterine fibroid. Electronically Signed   By: Oneil Devonshire M.D.   On: 02/22/2024 22:26   CT ABDOMEN PELVIS W CONTRAST Result Date: 02/22/2024 EXAM: CT ABDOMEN AND PELVIS WITH CONTRAST 02/22/2024 08:44:09 PM TECHNIQUE: CT of the abdomen and pelvis was performed with the administration of intravenous contrast, 100mL of iohexol  (OMNIPAQUE ) 300 MG/ML solution. Multiplanar reformatted images are provided for review. Automated exposure control, iterative reconstruction, and/or weight-based adjustment of the mA/kV was utilized to reduce the radiation dose to as low as reasonably achievable. COMPARISON: Comparison to CT from 07/07/2022. CLINICAL HISTORY: Bowel obstruction suspected; RLQ abdominal pain. Patient came in from home, complaining of increased weakness, nausea, and near syncopal episodes since yesterday. Had total knee surgery Tuesday. Left messages with office with no return phone call. FINDINGS: LOWER CHEST: No acute abnormality. LIVER: The liver is unremarkable. GALLBLADDER AND BILE DUCTS: Status  post cholecystectomy. No biliary ductal dilatation. SPLEEN: No acute abnormality. PANCREAS: No  acute abnormality. ADRENAL GLANDS: No acute abnormality. KIDNEYS, URETERS AND BLADDER: No stones in the kidneys or ureters. No hydronephrosis. No perinephric or periureteral stranding. Urinary bladder is unremarkable. GI AND BOWEL: Appendix is normal, as seen on image 71. Stomach demonstrates no acute abnormality. There is no bowel obstruction. No bowel wall thickening. REPRODUCTIVE ORGANS: A 5.6 cm right ovarian cystic lesion is present, previously 3.8 cm. Uterus and left ovary are unremarkable. PERITONEUM AND RETROPERITONEUM: No ascites. No free air. VASCULATURE: Aorta is normal in caliber. LYMPH NODES: No lymphadenopathy. BONES AND SOFT TISSUES: No acute osseous abnormality. No focal soft tissue abnormality. IMPRESSION: 1. Normal appendix. 2. 5.6 cm right ovarian cystic lesion, increased. Consider pelvic ultrasound with doppler for further evaluation in this clinical setting. Electronically signed by: Pinkie Pebbles MD 02/22/2024 08:54 PM EDT RP Workstation: [id number]    EKG: None  Radiology: No results found.   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 Labs: ordered. Decision-making details documented in ED Course. Radiology: independent interpretation performed. Decision-making details documented in ED Course.  Risk Prescription drug management. Decision regarding hospitalization.   Labs ordered/sent. Recent imaging reviewed.   Differential diagnosis includes infection, uti, IC, etc. Dispo decision including potential need for admission considered - will get labs and reassess.   Reviewed nursing  notes and prior charts for additional history. External reports reviewed. Prior records reviewed - recent CT abd/pelvis x 2 negative for acute process - had ovarian cyst, u/s neg for torsion - pt indicates has an ob/gyn through [facility] system that she can f/u with for that. No current lower abd pain or tenderness. Recent prior ct chest neg for acute process. No current chest pain or sob. No new or worsening cough. From 6/13 urine culture neg, from 6/14 one culture pos moraxella species, no sensitivity reported, subsequent cultures negative (no fevers noted then)?bacteremia, ?contam. Pt indicates has two days abx therapy left. Afebrile in ED. Lactate normal.  Labs reviewed/interpreted by me - wbc 10 c/w prior. Hct normal. Chem normal. Lactate normal.   Recent CT reviewed/interpreted by me - right ovarian cyst, no other new/acute process then, ct chest neg for pna or acute process.   Pt observed in ED for 5+ hours. Repeat temp x 2 normal. Is eating/drinking ([employer] in room) - no emesis. No current fever, chills or sweats. No new/acute infection noted on exam.  Pt has close f/u with her orthopedist today - is encouraged to f/u today. Pt inquires about outpatient ID f/u - will provided info and rec outpatient f/u with pcp/ID.  Pt currently appears stable for ED d/c.   Strict return precautions provided.       Final diagnoses:  None    ED Discharge Orders     None          Bernard Drivers, MD 03/06/24 [phone]

## 2024-03-06 NOTE — ED Triage Notes (Signed)
 Pt states she was discharged from hospital admission for bacteriemia on Saturday. Pt has had bilateral knee surgeries (4/29 and 6/10). Taking her oral abx. She is nauseated, pale,  feels hot/cold, concerned she may also have a bladder infection- burning with urination.

## 2024-03-06 NOTE — Discharge Instructions (Addendum)
 It was our pleasure to provide your ER care today - we hope that you feel better. Drink plenty of fluids/stay well hydrated. Complete the course of your antibiotic.   Follow up with your orthopedist today as planned.   Also follow up closely with your primary care doctor and ID doctor in the coming week - call office to arrange follow up appointment.  For recent diagnosis of ovarian cyst, follow up closely with ob/gyn doctor in the next couple weeks.   Return to ER right away if worse, new symptoms, fevers, new or worsening area of pain or swelling, new or worsening abdominal pain, persistent vomiting, trouble breathing, weak/fainting, new or worsening knee pain/swelling/redness, or other concern.

## 2024-03-07 ENCOUNTER — Telehealth: Payer: Self-pay

## 2024-03-07 ENCOUNTER — Inpatient Hospital Stay: Admitting: Internal Medicine

## 2024-03-07 NOTE — Transitions of Care (Post Inpatient/ED Visit) (Signed)
   03/07/2024  Name: Hailey [name] MRN: [id number] DOB: 1976-08-27  Today's TOC FU Call Status: Today's TOC FU Call Status:: Successful TOC FU Call Completed TOC FU Call Complete Date: 03/07/24 Patient's Name and Date of Birth confirmed.  Transition Care Management Follow-up Telephone Call Date of Discharge: 03/06/24 Discharge Facility: [facility] [place]) Type of Discharge: Emergency Department Reason for ED Visit: Other: (Nausea) How have you been since you were released from the hospital?: Better Any questions or concerns?: No  Items Reviewed: Did you receive and understand the discharge instructions provided?: No Medications obtained,verified, and reconciled?: Yes (Medications Reviewed) Any new allergies since your discharge?: No Dietary orders reviewed?: NA Do you have support at home?: Yes  Medications Reviewed Today: YES   Home Care and Equipment/Supplies: Were Home Health Services Ordered?: NA Any new equipment or medical supplies ordered?: NA  Functional Questionnaire: Do you need assistance with bathing/showering or dressing?: No Do you need assistance with meal preparation?: No Do you need assistance with eating?: No Do you have difficulty maintaining continence: No Do you need assistance with getting out of bed/getting out of a chair/moving?: No Do you have difficulty managing or taking your medications?: No  Follow up appointments reviewed: PCP Follow-up appointment confirmed?: NA Specialist Hospital Follow-up appointment confirmed?: NA Do you need transportation to your follow-up appointment?: No Do you understand care options if your condition(s) worsen?: Yes-patient verbalized understanding    Hailey [name] [place]  Primary Care & Sports Medicine at [facility] CMA, AAMA [address]  [place] [address] Office [phone]  Fax: [phone]

## 2024-03-11 ENCOUNTER — Ambulatory Visit: Attending: Student | Admitting: Physical Therapy

## 2024-03-11 DIAGNOSIS — R262 Difficulty in walking, not elsewhere classified: Secondary | ICD-10-CM | POA: Insufficient documentation

## 2024-03-11 DIAGNOSIS — G8918 Other acute postprocedural pain: Secondary | ICD-10-CM | POA: Diagnosis not present

## 2024-03-11 DIAGNOSIS — M25661 Stiffness of right knee, not elsewhere classified: Secondary | ICD-10-CM | POA: Diagnosis not present

## 2024-03-11 DIAGNOSIS — M6281 Muscle weakness (generalized): Secondary | ICD-10-CM | POA: Insufficient documentation

## 2024-03-11 DIAGNOSIS — M25561 Pain in right knee: Secondary | ICD-10-CM | POA: Insufficient documentation

## 2024-03-11 NOTE — Therapy (Unsigned)
 OUTPATIENT PHYSICAL THERAPY POST-OP TREATMENT   Patient Name: Hailey [name] MRN: [id number] DOB:Jul 27, 1976, 48 y.o., female Today's Date:03/11/2024   END OF SESSION:  PT End of Session - 03/11/24 1505     Visit Number 2    Number of Visits 17    Date for PT Re-Evaluation 05/01/24    Authorization Type [employer] [id number]    PT Start Time 1514    PT Stop Time 1556    PT Time Calculation (min) 42 min    Activity Tolerance Patient tolerated treatment well    Behavior During Therapy WFL for tasks assessed/performed           Past Medical History:  Diagnosis Date   Allergic state    Allergy    Ankylosing spondylitis (HCC)    followed by rheumatology Dr. Tobie   Arthritis    Asthma    Cervical disc disease    Chest pain    Depression    Enlarged lymph nodes    GERD (gastroesophageal reflux disease)    H/O colonoscopy    HPV (human papilloma virus) infection    Hypothyroidism    Interstitial cystitis    LGSIL (low grade squamous intraepithelial dysplasia)    Paresthesia    Pre-diabetes    Status post laparoscopic cholecystectomy 07/13/2016   Past Surgical History:  Procedure Laterality Date   BREAST BIOPSY Right 10/18/2015   bx/clip- neg   CHOLECYSTECTOMY N/A 07/07/2016   Procedure: LAPAROSCOPIC CHOLECYSTECTOMY;  Surgeon: Aloysius Plant, MD;  Location: [facility] ORS;  Service: General;  Laterality: N/A;   COLONOSCOPY WITH PROPOFOL  N/A 10/11/2015   Procedure: COLONOSCOPY WITH PROPOFOL ;  Surgeon: Gladis RAYMOND Mariner, MD;  Location: [facility] ENDOSCOPY;  Service: Endoscopy;  Laterality: N/A;   ESOPHAGOGASTRODUODENOSCOPY (EGD) WITH PROPOFOL  N/A 10/11/2015   Procedure: ESOPHAGOGASTRODUODENOSCOPY (EGD) WITH PROPOFOL ;  Surgeon: Gladis RAYMOND Mariner, MD;  Location: [facility] ENDOSCOPY;  Service: Endoscopy;  Laterality: N/A;   TONSILLECTOMY     TOTAL KNEE ARTHROPLASTY Left 01/08/2024   Procedure: LEFT TOTAL KNEE ARTHROPLASTYL;  Surgeon: Ernie Cough, MD;  Location: [facility] ORS;  Service: Orthopedics;   Laterality: Left;   TOTAL KNEE ARTHROPLASTY Right 02/19/2024   Procedure: ARTHROPLASTY, KNEE, TOTAL;  Surgeon: Ernie Cough, MD;  Location: [facility] ORS;  Service: Orthopedics;  Laterality: Right;   TOTAL THYROIDECTOMY  2005   goiter   Patient Active Problem List   Diagnosis Date Noted   Bacteremia 02/27/2024   S/P total knee arthroplasty, right 02/19/2024   S/P total knee arthroplasty, left 01/08/2024   S/P total knee arthroplasty 01/08/2024   Atypical chest pain 11/23/2023   Preop cardiovascular exam 11/23/2023   Dyslipidemia 11/23/2023   Generalized lymphadenopathy 09/23/2021   B12 deficiency 09/23/2021   Fatigue 09/23/2021   Primary osteoarthritis of both knees 04/29/2021   Ankylosing spondylitis (HCC) 12/30/2018   Asthma in adult without complication 12/30/2018   Chronic diarrhea 12/30/2018   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   Interstitial cystitis 07/03/2016   Pelvic pain in female 07/03/2016   Chondromalacia of knee, 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   BMI 38.0-38.9,adult 02/14/2016   Polyarthralgia 02/14/2016   GERD (gastroesophageal reflux disease) 02/10/2014    PCP: Justus Leita DEL, MD  REFERRING PROVIDER: Patti Rosina SAUNDERS, PA-C  REFERRING DIAG: M17.11 (ICD-10-CM) - Unilateral primary osteoarthritis, right knee   RATIONALE FOR EVALUATION AND TREATMENT: Rehabilitation  THERAPY DIAG: Acute postoperative pain of right knee  Stiffness of right knee, not elsewhere classified  Difficulty in walking, not elsewhere classified  Muscle weakness (generalized)  ONSET DATE:  R TKA 02/19/24, Hx L TKA 01/08/24  FOLLOW-UP APPT SCHEDULED WITH REFERRING PROVIDER: Yes ; pt f/u with surgeon 03/06/24   SUBJECTIVE:                                                                                                                                                                                         SUBJECTIVE STATEMENT:  Pt is a 48 year old female known to this clinic with Hx of L TKA 01/08/24  who underwent post-op rehab with good ROM progress. Pt is currently s/p R TKA 02/19/24.   PERTINENT HISTORY: Pt is a 48 year old female known to this clinic with Hx of L TKA 01/08/24 who underwent post-op rehab with good ROM progress. Pt is currently s/p R TKA 02/19/24.   Pt has unfortunately had significant challenges with early post-op phase during both knee replacements. Pt had ED visits on 6/13 and 6/14 related to nausea and weakness following Sx. Infection, CVD, PE, and acute process in abdomen/pelvis ruled out. Pt had additional hospital admission on 02/27/24 due to bacteremia. She was told infection was possibly due to Foley catheter. Pt is following up with Dr. Justus this Friday to f/u on infection status.   Pt had second culture without significant bacterial growth after initiating antibiotic. Patient reports some ongoing dysuria; pt thinks she may have had hematuria. Patient reports bilateral knee tightness/stiffness. Pt was told by acute care PT she is somewhat behind on ROM. Pt reports intermittent buckling into extension with longer stride. No concerning S&S along post-op incision.    PAIN:   Pain Intensity: Present: 2/10, Best: 2/10, Worst: 6/10 as if week of IE Pain location: Peri-incisional pain Pain quality: aching  Radiating pain: some muscle pain in calf  Swelling: Yes  Numbness/Tingling: Yes; around incision and tibial plateau region  Focal weakness or buckling: Yes; pt buckling into hyperextension Aggravating factors: trying to sleep/lie down, maintaining R knee straight/fully extended Relieving factors: ice, exercise/stretching  24-hour pain behavior: worse in AM, later in evenig  History of prior back, hip, or knee injury, pain, surgery, or therapy: Yes; hx of L TKA 01/08/24, R 02/19/24  Imaging: Yes ; post-op X-ray, swelling noted but no infection ruled in  Prior level of function: Independent Occupational demands: CT tech, X-ray tech; OR and dental imaging; substantial  time walking/standing  Hobbies: going to lake - boating; walking dog/walking exercise  Red flags: Negative for personal history of cancer, chills/fever, night sweats, nausea, vomiting, unexplained weight gain/loss, unrelenting pain  PRECAUTIONS: None  WEIGHT BEARING RESTRICTIONS: WBAT  FALLS: Has patient fallen in last 6 months? No  Living Environment Lives with: lives with their spouse  and lives with their daughter (105 y/o daughter) Lives in: House/apartment No steps to get into home; one-level home, handicap accessible, shower seat  Patient Goals: Able to return to work    OBJECTIVE:   Patient Surveys  LEFS = 30/80 = 37.5%  Cognition Patient is oriented to person, place, and time.  Recent memory is intact.  Remote memory is intact.  Attention span and concentration are intact.  Expressive speech is intact.  Patient's fund of knowledge is within normal limits for educational level.    Gross Musculoskeletal Assessment Tremor: None Bulk: Atrophy of R>L quadriceps, moderate Tone: Normal  GAIT: Distance walked: 80 ft Assistive device utilized: Walker - 2 wheeled Level of assistance: SBA Comments: Moderate dec terminal knee extension with swing phase   AROM AROM (Normal range in degrees) AROM 03/04/24  Hip Right Left  Flexion (125)    Extension (15)    Abduction (40)    Adduction     Internal Rotation (45)    External Rotation (45)        Knee    Flexion (135) 86 110  Extension (0) -4 -2      Ankle    Dorsiflexion (20) 8 8  Plantarflexion (50) WNL WNL  Inversion (35)    Eversion (15    (* = pain; Blank rows = not tested)  LE MMT: MMT (out of 5) Right 03/04/24 Left 03/04/24  Hip flexion 4- 4  Hip extension    Hip abduction (seated) 5 5  Hip adduction (seated) 5 5  Hip internal rotation    Hip external rotation    Knee flexion 4- 4  Knee extension 3+ 4+  Ankle dorsiflexion    Ankle plantarflexion    Ankle inversion    Ankle eversion    (* =  pain; Blank rows = not tested)  Sensation Deferred  Reflexes Deferred  Palpation Location LEFT  RIGHT           Quadriceps  1  Medial Hamstrings    Lateral Hamstrings    Lateral Hamstring tendon    Medial Hamstring tendon    Quadriceps tendon    Patella  1  Patellar Tendon  1  Tibial Tuberosity  2  Medial joint line 1 2  Lateral joint line 1 1  MCL    LCL    Adductor Tubercle    Pes Anserine tendon    Infrapatellar fat pad    Fibular head    Popliteal fossa    (Blank rows = not tested) Graded on 0-4 scale (0 = no pain, 1 = pain, 2 = pain with wincing/grimacing/flinching, 3 = pain with withdrawal, 4 = unwilling to allow palpation), (Blank rows = not tested)  Passive Accessory Motion Deferred   VASCULAR Dorsalis pedis and posterior tibial pulses are palpable No sign of acute DVT Homan's sign: Negative bilat     TODAY'S TREATMENT:  S/p R TKA 02/19/24, L TKA 01/08/24   SUBJECTIVE STATEMENT:   Pt reports 3/10 pain at arrival in R knee. Patient reports L knee is doing well. Patient reports compliance with HEP. Pt follows back up with infectious disease MD 03/20/24.    AAROM knee R  -2-98 L  -112   Manual Therapy - for symptom modulation, soft tissue sensitivity and mobility, joint mobility, ROM   R knee PROM within pt tolerance; x 5 minutes Supine knee flexion stretch; 2 x 30 sec  -PT holding foot/ankle to hold position Knee flexion stretch over  edge of bed and measurement of PROM  Obtained ROM measurement for either knee    Therapeutic Exercise - for improved soft tissue flexibility and extensibility as needed for ROM, improved strength as needed to improve performance of CKC activities/functional movements   NuStep; Level 3, x 5 minutes; seat at 7 - for improved soft tissue mobility and increase bilat knee ROM (R TKA most acute)  -subjective gathered during this time intermittently, 2 minutes not billed  Knee prop extension stretch with ice pack; x 3  minutes  Quad set; 1 x 10, towel roll under knee  -for HEP review  -encouraged completion of quad set 2-3x/day at home for improved knee extension and quad activation  Pt is able to perform SLR without extensor lag  Seated knee extension, R knee; 2 x 10   Ambulate laps around gym x 3 with verbal cueing for heel strike and full extension at terminal swing  // bars: Forward/retro stepping; x4 D/B Dynamic march; 4x D/B   PATIENT EDUCATION:  Education details: see above for patient education details Person educated: Patient Education method: Explanation, Demonstration, and Handouts Education comprehension: verbalized understanding and returned demonstration   HOME EXERCISE PROGRAM:  Pt continuing with established home exercises from TKA packet  -pt instructed on propping knee for gravity-assisted extension stretch    ASSESSMENT:  CLINICAL IMPRESSION: Patient is making fair progress with R knee ROM; she feels that her pain has been somewhat worse on R side, but she has been progressing with knee extension on R side better than that on L side at similar time post-op. Patient has [facility] knee extension for R knee and is progressing appropriately with R knee flexion. Patient exhibits improving gait pattern, and she is able to complete home-level gait without AD safely (though she does have gait deviations requiring further intervention). Pt will continue to benefit from skilled PT services to address deficits and improve function.   OBJECTIVE IMPAIRMENTS: Abnormal gait, decreased activity tolerance, decreased mobility, difficulty walking, decreased ROM, decreased strength, hypomobility, impaired flexibility, and pain.   ACTIVITY LIMITATIONS: carrying, lifting, sitting, standing, squatting, stairs, transfers, bed mobility, and locomotion level  PARTICIPATION LIMITATIONS: meal prep, cleaning, driving, shopping, community activity, and occupation  PERSONAL FACTORS: Past/current experiences  and 3+ comorbidities: (currently being treated for bacteremia, ankylosing spondylitis, polyarthralgia, dyslipidemia) are also affecting patient's functional outcome.   REHAB POTENTIAL: Good  CLINICAL DECISION MAKING: Evolving/moderate complexity  EVALUATION COMPLEXITY: Moderate   GOALS: Goals reviewed with patient? Yes  SHORT TERM GOALS: Target date: 03/20/2024  Pt will be independent with HEP to improve strength and decrease knee pain to improve pain-free function at home and work. Baseline: 03/04/24: Pt continuing established HEP from hospital/TKA packet; discussed knee extension propped stretch and frequent work on heel slides.  Goal status: INITIAL   LONG TERM GOALS: Target date: 04/24/2024  Pt will have bilateral knee AROM 0-115 deg at least as needed for performance of ADLs and normalized extension for gait pattern Baseline: 03/04/24: Knee AROM R -4-86, L -2-110 Goal status: INITIAL  2.  Pt will decrease worst knee pain by at least 3 points on the NPRS in order to demonstrate clinically significant reduction in knee pain. Baseline: 03/04/24: 6/10 pain at worst.  Goal status: INITIAL  3.  Pt will decrease LEFS score by at least 9 points in order demonstrate clinically significant reduction in knee pain/disability.       Baseline: 03/04/24: 30/80  Goal status: INITIAL  4.  Pt will increase  strength of bilateral hip flexors and quads/hamstrings to at least 4+/5 or greater MMT grade in order to demonstrate improvement in strength and function needed for transferring and improved lower limb stability.,  Baseline: 03/04/24: MMT 3+ to 4 for aforementioned muscle groups.  Goal status: INITIAL  5.  Pt will ambulate at community level (660 feet or greater in office) with no AD and symmetrical weightbearing on either LE without major deviations as needed for return to independent mobility and return to work Baseline: 03/04/24: Limited distance tolerated for walking s/p R TKA. Goal status:  INITIAL   PLAN: PT FREQUENCY: 1-2x/week  PT DURATION: 6-8 weeks  PLANNED INTERVENTIONS: Therapeutic exercises, Therapeutic activity, Neuromuscular re-education, Balance training, Gait training, Patient/Family education, Self Care, Joint mobilization, DME instructions, Dry Needling, Electrical stimulation, Cryotherapy, Moist heat, Taping, Manual therapy, and Re-evaluation.  PLAN FOR NEXT SESSION: Knee ROM, quad/HS/hip strengthening, progressive weightbearing and stepping drills to work on weaning toward LRAD and then to no AD.    Venetia Endo, PT, DPT #[id number]  Venetia ONEIDA Endo, PT 03/11/2024, 3:14 PM

## 2024-03-12 ENCOUNTER — Encounter: Payer: Self-pay | Admitting: Physical Therapy

## 2024-03-12 ENCOUNTER — Ambulatory Visit: Payer: Self-pay | Admitting: Internal Medicine

## 2024-03-12 LAB — MISC LABCORP TEST (SEND OUT)
LabCorp test name: 4
Labcorp test code: 182261

## 2024-03-13 ENCOUNTER — Encounter: Payer: Self-pay | Admitting: Physical Therapy

## 2024-03-13 ENCOUNTER — Ambulatory Visit: Admitting: Physical Therapy

## 2024-03-13 DIAGNOSIS — M25561 Pain in right knee: Secondary | ICD-10-CM | POA: Diagnosis not present

## 2024-03-13 DIAGNOSIS — M6281 Muscle weakness (generalized): Secondary | ICD-10-CM | POA: Diagnosis not present

## 2024-03-13 DIAGNOSIS — R262 Difficulty in walking, not elsewhere classified: Secondary | ICD-10-CM

## 2024-03-13 DIAGNOSIS — G8918 Other acute postprocedural pain: Secondary | ICD-10-CM

## 2024-03-13 DIAGNOSIS — M25661 Stiffness of right knee, not elsewhere classified: Secondary | ICD-10-CM

## 2024-03-13 NOTE — Therapy (Signed)
 OUTPATIENT PHYSICAL THERAPY POST-OP TREATMENT   Patient Name: Hailey [name] MRN: [id number] DOB:18-Apr-1976, 48 y.o., female Today's Date:03/13/2024   END OF SESSION:  PT End of Session - 03/13/24 1516     Visit Number 3    Number of Visits 17    Date for PT Re-Evaluation 05/01/24    Authorization Type [employer] [id number]    PT Start Time 1515    PT Stop Time 1557    PT Time Calculation (min) 42 min    Activity Tolerance Patient tolerated treatment well    Behavior During Therapy WFL for tasks assessed/performed            Past Medical History:  Diagnosis Date   Allergic state    Allergy    Ankylosing spondylitis (HCC)    followed by rheumatology Dr. Tobie   Arthritis    Asthma    Cervical disc disease    Chest pain    Depression    Enlarged lymph nodes    GERD (gastroesophageal reflux disease)    H/O colonoscopy    HPV (human papilloma virus) infection    Hypothyroidism    Interstitial cystitis    LGSIL (low grade squamous intraepithelial dysplasia)    Paresthesia    Pre-diabetes    Status post laparoscopic cholecystectomy 07/13/2016   Past Surgical History:  Procedure Laterality Date   BREAST BIOPSY Right 10/18/2015   bx/clip- neg   CHOLECYSTECTOMY N/A 07/07/2016   Procedure: LAPAROSCOPIC CHOLECYSTECTOMY;  Surgeon: Aloysius Plant, MD;  Location: [facility] ORS;  Service: General;  Laterality: N/A;   COLONOSCOPY WITH PROPOFOL  N/A 10/11/2015   Procedure: COLONOSCOPY WITH PROPOFOL ;  Surgeon: Gladis RAYMOND Mariner, MD;  Location: [facility] ENDOSCOPY;  Service: Endoscopy;  Laterality: N/A;   ESOPHAGOGASTRODUODENOSCOPY (EGD) WITH PROPOFOL  N/A 10/11/2015   Procedure: ESOPHAGOGASTRODUODENOSCOPY (EGD) WITH PROPOFOL ;  Surgeon: Gladis RAYMOND Mariner, MD;  Location: [facility] ENDOSCOPY;  Service: Endoscopy;  Laterality: N/A;   TONSILLECTOMY     TOTAL KNEE ARTHROPLASTY Left 01/08/2024   Procedure: LEFT TOTAL KNEE ARTHROPLASTYL;  Surgeon: Ernie Cough, MD;  Location: [facility] ORS;  Service: Orthopedics;   Laterality: Left;   TOTAL KNEE ARTHROPLASTY Right 02/19/2024   Procedure: ARTHROPLASTY, KNEE, TOTAL;  Surgeon: Ernie Cough, MD;  Location: [facility] ORS;  Service: Orthopedics;  Laterality: Right;   TOTAL THYROIDECTOMY  2005   goiter   Patient Active Problem List   Diagnosis Date Noted   Bacteremia 02/27/2024   S/P total knee arthroplasty, right 02/19/2024   S/P total knee arthroplasty, left 01/08/2024   S/P total knee arthroplasty 01/08/2024   Atypical chest pain 11/23/2023   Preop cardiovascular exam 11/23/2023   Dyslipidemia 11/23/2023   Generalized lymphadenopathy 09/23/2021   B12 deficiency 09/23/2021   Fatigue 09/23/2021   Primary osteoarthritis of both knees 04/29/2021   Ankylosing spondylitis (HCC) 12/30/2018   Asthma in adult without complication 12/30/2018   Chronic diarrhea 12/30/2018   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   Interstitial cystitis 07/03/2016   Pelvic pain in female 07/03/2016   Chondromalacia of knee, 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   BMI 38.0-38.9,adult 02/14/2016   Polyarthralgia 02/14/2016   GERD (gastroesophageal reflux disease) 02/10/2014    PCP: Justus Leita DEL, MD  REFERRING PROVIDER: Patti Rosina SAUNDERS, PA-C  REFERRING DIAG: M17.11 (ICD-10-CM) - Unilateral primary osteoarthritis, right knee   RATIONALE FOR EVALUATION AND TREATMENT: Rehabilitation  THERAPY DIAG: Acute postoperative pain of right knee  Stiffness of right knee, not elsewhere classified  Difficulty in walking, not elsewhere classified  Muscle weakness (generalized)  ONSET  DATE: R TKA 02/19/24, Hx L TKA 01/08/24  FOLLOW-UP APPT SCHEDULED WITH REFERRING PROVIDER: Yes ; pt f/u with surgeon 03/06/24   SUBJECTIVE:                                                                                                                                                                                         SUBJECTIVE STATEMENT:  Pt is a 48 year old female known to this clinic with Hx of L TKA 01/08/24  who underwent post-op rehab with good ROM progress. Pt is currently s/p R TKA 02/19/24.   PERTINENT HISTORY: Pt is a 48 year old female known to this clinic with Hx of L TKA 01/08/24 who underwent post-op rehab with good ROM progress. Pt is currently s/p R TKA 02/19/24.   Pt has unfortunately had significant challenges with early post-op phase during both knee replacements. Pt had ED visits on 6/13 and 6/14 related to nausea and weakness following Sx. Infection, CVD, PE, and acute process in abdomen/pelvis ruled out. Pt had additional hospital admission on 02/27/24 due to bacteremia. She was told infection was possibly due to Foley catheter. Pt is following up with Dr. Justus this Friday to f/u on infection status.   Pt had second culture without significant bacterial growth after initiating antibiotic. Patient reports some ongoing dysuria; pt thinks she may have had hematuria. Patient reports bilateral knee tightness/stiffness. Pt was told by acute care PT she is somewhat behind on ROM. Pt reports intermittent buckling into extension with longer stride. No concerning S&S along post-op incision.    PAIN:   Pain Intensity: Present: 2/10, Best: 2/10, Worst: 6/10 as if week of IE Pain location: Peri-incisional pain Pain quality: aching  Radiating pain: some muscle pain in calf  Swelling: Yes  Numbness/Tingling: Yes; around incision and tibial plateau region  Focal weakness or buckling: Yes; pt buckling into hyperextension Aggravating factors: trying to sleep/lie down, maintaining R knee straight/fully extended Relieving factors: ice, exercise/stretching  24-hour pain behavior: worse in AM, later in evenig  History of prior back, hip, or knee injury, pain, surgery, or therapy: Yes; hx of L TKA 01/08/24, R 02/19/24  Imaging: Yes ; post-op X-ray, swelling noted but no infection ruled in  Prior level of function: Independent Occupational demands: CT tech, X-ray tech; OR and dental imaging; substantial  time walking/standing  Hobbies: going to lake - boating; walking dog/walking exercise  Red flags: Negative for personal history of cancer, chills/fever, night sweats, nausea, vomiting, unexplained weight gain/loss, unrelenting pain  PRECAUTIONS: None  WEIGHT BEARING RESTRICTIONS: WBAT  FALLS: Has patient fallen in last 6 months? No  Living Environment Lives with: lives with their  spouse and lives with their daughter 48 y/o daughter) Lives in: House/apartment No steps to get into home; one-level home, handicap accessible, shower seat  Patient Goals: Able to return to work    OBJECTIVE:   Patient Surveys  LEFS = 30/80 = 37.5%  Cognition Patient is oriented to person, place, and time.  Recent memory is intact.  Remote memory is intact.  Attention span and concentration are intact.  Expressive speech is intact.  Patient's fund of knowledge is within normal limits for educational level.    Gross Musculoskeletal Assessment Tremor: None Bulk: Atrophy of R>L quadriceps, moderate Tone: Normal  GAIT: Distance walked: 80 ft Assistive device utilized: Kehlani Vancamp - 2 wheeled Level of assistance: SBA Comments: Moderate dec terminal knee extension with swing phase   AROM AROM (Normal range in degrees) AROM 03/04/24  Hip Right Left  Flexion (125)    Extension (15)    Abduction (40)    Adduction     Internal Rotation (45)    External Rotation (45)        Knee    Flexion (135) 86 110  Extension (0) -4 -2      Ankle    Dorsiflexion (20) 8 8  Plantarflexion (50) WNL WNL  Inversion (35)    Eversion (15    (* = pain; Blank rows = not tested)  LE MMT: MMT (out of 5) Right 03/04/24 Left 03/04/24  Hip flexion 4- 4  Hip extension    Hip abduction (seated) 5 5  Hip adduction (seated) 5 5  Hip internal rotation    Hip external rotation    Knee flexion 4- 4  Knee extension 3+ 4+  Ankle dorsiflexion    Ankle plantarflexion    Ankle inversion    Ankle eversion    (* =  pain; Blank rows = not tested)  Sensation Deferred  Reflexes Deferred  Palpation Location LEFT  RIGHT           Quadriceps  1  Medial Hamstrings    Lateral Hamstrings    Lateral Hamstring tendon    Medial Hamstring tendon    Quadriceps tendon    Patella  1  Patellar Tendon  1  Tibial Tuberosity  2  Medial joint line 1 2  Lateral joint line 1 1  MCL    LCL    Adductor Tubercle    Pes Anserine tendon    Infrapatellar fat pad    Fibular head    Popliteal fossa    (Blank rows = not tested) Graded on 0-4 scale (0 = no pain, 1 = pain, 2 = pain with wincing/grimacing/flinching, 3 = pain with withdrawal, 4 = unwilling to allow palpation), (Blank rows = not tested)  Passive Accessory Motion Deferred   VASCULAR Dorsalis pedis and posterior tibial pulses are palpable No sign of acute DVT Homan's sign: Negative bilat     TODAY'S TREATMENT: 03/13/24  S/p R TKA 02/19/24, L TKA 01/08/24    SUBJECTIVE STATEMENT:   Patient reports 2/10 pain on arrival. Patient reports significant pain in the R knee along the incision.  Manual Therapy - for symptom modulation, soft tissue sensitivity and mobility, joint mobility, ROM   Patellar mobilizations (inferior/superior, medial/lateral) x 5 minutes  Scar massage (cross friction, parallel) with use of massage cream x 10 minutes     Therapeutic Exercise - for improved soft tissue flexibility and extensibility as needed for ROM, improved strength as needed to improve performance of CKC activities/functional movements  NuStep; Level 3, x 5 minutes; seat at 7 - for improved soft tissue mobility and increase bilat knee ROM (R TKA most acute)  Seated LAQ x 10 with 3 second hold each LE TRX squats with chair behind patient 2 x 10  6 forward step taps x 15 each LE   Ambulation in gym with SPC x 100' - cues for heel strike - good follow through   Education provided on scar massage and patellar mobilizations along with utilizing SPC for  mobility, patient verbalized understanding  PATIENT EDUCATION:  Education details: see above for patient education details Person educated: Patient Education method: Explanation, Demonstration, and Handouts Education comprehension: verbalized understanding and returned demonstration   HOME EXERCISE PROGRAM:  Pt continuing with established home exercises from TKA packet  -pt instructed on propping knee for gravity-assisted extension stretch    ASSESSMENT:  CLINICAL IMPRESSION:   Session focused on scar massage and patellar mobilizations due to notable adhesions in the scar. Also introduced functional BLE strengthening in standing as well as gait training with SPC. Tolerated session well. Patient exhibits improving gait pattern, and she is able to complete home-level gait without AD safely (though she does have gait deviations requiring further intervention). Pt will continue to benefit from skilled PT services to address deficits and improve function.   OBJECTIVE IMPAIRMENTS: Abnormal gait, decreased activity tolerance, decreased mobility, difficulty walking, decreased ROM, decreased strength, hypomobility, impaired flexibility, and pain.   ACTIVITY LIMITATIONS: carrying, lifting, sitting, standing, squatting, stairs, transfers, bed mobility, and locomotion level  PARTICIPATION LIMITATIONS: meal prep, cleaning, driving, shopping, community activity, and occupation  PERSONAL FACTORS: Past/current experiences and 3+ comorbidities: (currently being treated for bacteremia, ankylosing spondylitis, polyarthralgia, dyslipidemia) are also affecting patient's functional outcome.   REHAB POTENTIAL: Good  CLINICAL DECISION MAKING: Evolving/moderate complexity  EVALUATION COMPLEXITY: Moderate   GOALS: Goals reviewed with patient? Yes  SHORT TERM GOALS: Target date: 03/20/2024  Pt will be independent with HEP to improve strength and decrease knee pain to improve pain-free function at home  and work. Baseline: 03/04/24: Pt continuing established HEP from hospital/TKA packet; discussed knee extension propped stretch and frequent work on heel slides.  Goal status: INITIAL   LONG TERM GOALS: Target date: 04/24/2024  Pt will have bilateral knee AROM 0-115 deg at least as needed for performance of ADLs and normalized extension for gait pattern Baseline: 03/04/24: Knee AROM R -4-86, L -2-110 Goal status: INITIAL  2.  Pt will decrease worst knee pain by at least 3 points on the NPRS in order to demonstrate clinically significant reduction in knee pain. Baseline: 03/04/24: 6/10 pain at worst.  Goal status: INITIAL  3.  Pt will decrease LEFS score by at least 9 points in order demonstrate clinically significant reduction in knee pain/disability.       Baseline: 03/04/24: 30/80  Goal status: INITIAL  4.  Pt will increase strength of bilateral hip flexors and quads/hamstrings to at least 4+/5 or greater MMT grade in order to demonstrate improvement in strength and function needed for transferring and improved lower limb stability.,  Baseline: 03/04/24: MMT 3+ to 4 for aforementioned muscle groups.  Goal status: INITIAL  5.  Pt will ambulate at community level (660 feet or greater in office) with no AD and symmetrical weightbearing on either LE without major deviations as needed for return to independent mobility and return to work Baseline: 03/04/24: Limited distance tolerated for walking s/p R TKA. Goal status: INITIAL   PLAN: PT FREQUENCY: 1-2x/week  PT DURATION: 6-8 weeks  PLANNED INTERVENTIONS: Therapeutic exercises, Therapeutic activity, Neuromuscular re-education, Balance training, Gait training, Patient/Family education, Self Care, Joint mobilization, DME instructions, Dry Needling, Electrical stimulation, Cryotherapy, Moist heat, Taping, Manual therapy, and Re-evaluation.  PLAN FOR NEXT SESSION: Knee ROM, quad/HS/hip strengthening, progressive weightbearing and stepping drills  to work on weaning toward LRAD and then to no AD.    Maryanne Finder, PT, DPT Physical Therapist - [facility]  [facility]  Maryanne DELENA Finder, PT 03/13/2024, 3:16 PM

## 2024-03-17 ENCOUNTER — Inpatient Hospital Stay: Admitting: Internal Medicine

## 2024-03-18 ENCOUNTER — Ambulatory Visit: Admitting: Physical Therapy

## 2024-03-18 DIAGNOSIS — G8918 Other acute postprocedural pain: Secondary | ICD-10-CM | POA: Diagnosis not present

## 2024-03-18 DIAGNOSIS — M25661 Stiffness of right knee, not elsewhere classified: Secondary | ICD-10-CM

## 2024-03-18 DIAGNOSIS — M6281 Muscle weakness (generalized): Secondary | ICD-10-CM

## 2024-03-18 DIAGNOSIS — R262 Difficulty in walking, not elsewhere classified: Secondary | ICD-10-CM

## 2024-03-18 DIAGNOSIS — M25561 Pain in right knee: Secondary | ICD-10-CM | POA: Diagnosis not present

## 2024-03-18 LAB — CULTURE, BLOOD (ROUTINE X 2)

## 2024-03-18 NOTE — Therapy (Unsigned)
 OUTPATIENT PHYSICAL THERAPY POST-OP TREATMENT   Patient Name: Hailey [name] MRN: [id number] DOB:[birth year], 48 y.o., female Today's Date:03/18/2024   END OF SESSION:  Hailey [name] End of Session - 03/18/24 1511     Visit Number 4    Number of Visits 17    Date for Hailey [name] Re-Evaluation 05/01/24    Authorization Type [employer] [id number]    Hailey [name] Start Time 1512    Hailey [name] Stop Time 1554    Hailey [name] Time Calculation (min) 42 min    Activity Tolerance Patient tolerated treatment well    Behavior During Therapy WFL for tasks assessed/performed             Past Medical History:  Diagnosis Date   Allergic state    Allergy    Ankylosing spondylitis (HCC)    followed by rheumatology Dr. Tobie   Arthritis    Asthma    Cervical disc disease    Chest pain    Depression    Enlarged lymph nodes    GERD (gastroesophageal reflux disease)    H/O colonoscopy    HPV (human papilloma virus) infection    Hypothyroidism    Interstitial cystitis    LGSIL (low grade squamous intraepithelial dysplasia)    Paresthesia    Pre-diabetes    Status post laparoscopic cholecystectomy 07/13/2016   Past Surgical History:  Procedure Laterality Date   BREAST BIOPSY Right 10/18/2015   bx/clip- neg   CHOLECYSTECTOMY N/A 07/07/2016   Procedure: LAPAROSCOPIC CHOLECYSTECTOMY;  Surgeon: Aloysius Plant, MD;  Location: [facility] ORS;  Service: General;  Laterality: N/A;   COLONOSCOPY WITH PROPOFOL  N/A 10/11/2015   Procedure: COLONOSCOPY WITH PROPOFOL ;  Surgeon: Gladis RAYMOND Mariner, MD;  Location: [facility] ENDOSCOPY;  Service: Endoscopy;  Laterality: N/A;   ESOPHAGOGASTRODUODENOSCOPY (EGD) WITH PROPOFOL  N/A 10/11/2015   Procedure: ESOPHAGOGASTRODUODENOSCOPY (EGD) WITH PROPOFOL ;  Surgeon: Gladis RAYMOND Mariner, MD;  Location: [facility] ENDOSCOPY;  Service: Endoscopy;  Laterality: N/A;   TONSILLECTOMY     TOTAL KNEE ARTHROPLASTY Left 01/08/2024   Procedure: LEFT TOTAL KNEE ARTHROPLASTYL;  Surgeon: Ernie Cough, MD;  Location: [facility] ORS;  Service: Orthopedics;   Laterality: Left;   TOTAL KNEE ARTHROPLASTY Right 02/19/2024   Procedure: ARTHROPLASTY, KNEE, TOTAL;  Surgeon: Ernie Cough, MD;  Location: [facility] ORS;  Service: Orthopedics;  Laterality: Right;   TOTAL THYROIDECTOMY  2005   goiter   Patient Active Problem List   Diagnosis Date Noted   Bacteremia 02/27/2024   S/P total knee arthroplasty, right 02/19/2024   S/P total knee arthroplasty, left 01/08/2024   S/P total knee arthroplasty 01/08/2024   Atypical chest pain 11/23/2023   Preop cardiovascular exam 11/23/2023   Dyslipidemia 11/23/2023   Generalized lymphadenopathy 09/23/2021   B12 deficiency 09/23/2021   Fatigue 09/23/2021   Primary osteoarthritis of both knees 04/29/2021   Ankylosing spondylitis (HCC) 12/30/2018   Asthma in adult without complication 12/30/2018   Chronic diarrhea 12/30/2018   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   Interstitial cystitis 07/03/2016   Pelvic pain in female 07/03/2016   Chondromalacia of knee, 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   BMI 38.0-38.9,adult 02/14/2016   Polyarthralgia 02/14/2016   GERD (gastroesophageal reflux disease) 02/10/2014    PCP: Justus Leita DEL, MD  REFERRING PROVIDER: Patti Rosina SAUNDERS, PA-C  REFERRING DIAG: M17.11 (ICD-10-CM) - Unilateral primary osteoarthritis, right knee   RATIONALE FOR EVALUATION AND TREATMENT: Rehabilitation  THERAPY DIAG: Acute postoperative pain of right knee  Stiffness of right knee, not elsewhere classified  Difficulty in walking, not elsewhere classified  Muscle weakness (generalized)  ONSET DATE: R TKA 02/19/24, Hx L TKA 01/08/24  FOLLOW-UP APPT SCHEDULED WITH REFERRING PROVIDER: Yes ; Hailey [name] f/u with surgeon 03/06/24   SUBJECTIVE:                                                                                                                                                                                         SUBJECTIVE STATEMENT:  Hailey [name] is a 48 year old female known to this clinic with Hx of L TKA 01/08/24  who underwent post-op rehab with good ROM progress. Hailey [name] is currently s/p R TKA 02/19/24.   PERTINENT HISTORY: Hailey [name] is a 48 year old female known to this clinic with Hx of L TKA 01/08/24 who underwent post-op rehab with good ROM progress. Hailey [name] is currently s/p R TKA 02/19/24.   Hailey [name] has unfortunately had significant challenges with early post-op phase during both knee replacements. Hailey [name] had ED visits on 6/13 and 6/14 related to nausea and weakness following Sx. Infection, CVD, PE, and acute process in abdomen/pelvis ruled out. Hailey [name] had additional hospital admission on 02/27/24 due to bacteremia. She was told infection was possibly due to Foley catheter. Hailey [name] is following up with Dr. Justus this Friday to f/u on infection status.   Hailey [name] had second culture without significant bacterial growth after initiating antibiotic. Patient reports some ongoing dysuria; Hailey [name] thinks she may have had hematuria. Patient reports bilateral knee tightness/stiffness. Hailey [name] was told by acute care Hailey [name] she is somewhat behind on ROM. Hailey [name] reports intermittent buckling into extension with longer stride. No concerning S&S along post-op incision.    PAIN:   Pain Intensity: Present: 2/10, Best: 2/10, Worst: 6/10 as if week of IE Pain location: Peri-incisional pain Pain quality: aching  Radiating pain: some muscle pain in calf  Swelling: Yes  Numbness/Tingling: Yes; around incision and tibial plateau region  Focal weakness or buckling: Yes; Hailey [name] buckling into hyperextension Aggravating factors: trying to sleep/lie down, maintaining R knee straight/fully extended Relieving factors: ice, exercise/stretching  24-hour pain behavior: worse in AM, later in evenig  History of prior back, hip, or knee injury, pain, surgery, or therapy: Yes; hx of L TKA 01/08/24, R 02/19/24  Imaging: Yes ; post-op X-ray, swelling noted but no infection ruled in  Prior level of function: Independent Occupational demands: CT tech, X-ray tech; OR and dental imaging; substantial  time walking/standing  Hobbies: going to lake - boating; walking dog/walking exercise  Red flags: Negative for personal history of cancer, chills/fever, night sweats, nausea, vomiting, unexplained weight gain/loss, unrelenting pain  PRECAUTIONS: None  WEIGHT BEARING RESTRICTIONS: WBAT  FALLS: Has patient fallen in last 6 months? No  Living Environment Lives with: lives with  their spouse and lives with their daughter 48 y/o daughter) Lives in: House/apartment No steps to get into home; one-level home, handicap accessible, shower seat  Patient Goals: Able to return to work    OBJECTIVE:   Patient Surveys  LEFS = 30/80 = 37.5%  Cognition Patient is oriented to person, place, and time.  Recent memory is intact.  Remote memory is intact.  Attention span and concentration are intact.  Expressive speech is intact.  Patient's fund of knowledge is within normal limits for educational level.    Gross Musculoskeletal Assessment Tremor: None Bulk: Atrophy of R>L quadriceps, moderate Tone: Normal  GAIT: Distance walked: 80 ft Assistive device utilized: Walker - 2 wheeled Level of assistance: SBA Comments: Moderate dec terminal knee extension with swing phase   AROM AROM (Normal range in degrees) AROM 03/04/24  Hip Right Left  Flexion (125)    Extension (15)    Abduction (40)    Adduction     Internal Rotation (45)    External Rotation (45)        Knee    Flexion (135) 86 110  Extension (0) -4 -2      Ankle    Dorsiflexion (20) 8 8  Plantarflexion (50) WNL WNL  Inversion (35)    Eversion (15    (* = pain; Blank rows = not tested)  LE MMT: MMT (out of 5) Right 03/04/24 Left 03/04/24  Hip flexion 4- 4  Hip extension    Hip abduction (seated) 5 5  Hip adduction (seated) 5 5  Hip internal rotation    Hip external rotation    Knee flexion 4- 4  Knee extension 3+ 4+  Ankle dorsiflexion    Ankle plantarflexion    Ankle inversion    Ankle eversion    (* =  pain; Blank rows = not tested)  Sensation Deferred  Reflexes Deferred  Palpation Location LEFT  RIGHT           Quadriceps  1  Medial Hamstrings    Lateral Hamstrings    Lateral Hamstring tendon    Medial Hamstring tendon    Quadriceps tendon    Patella  1  Patellar Tendon  1  Tibial Tuberosity  2  Medial joint line 1 2  Lateral joint line 1 1  MCL    LCL    Adductor Tubercle    Pes Anserine tendon    Infrapatellar fat pad    Fibular head    Popliteal fossa    (Blank rows = not tested) Graded on 0-4 scale (0 = no pain, 1 = pain, 2 = pain with wincing/grimacing/flinching, 3 = pain with withdrawal, 4 = unwilling to allow palpation), (Blank rows = not tested)  Passive Accessory Motion Deferred   VASCULAR Dorsalis pedis and posterior tibial pulses are palpable No sign of acute DVT Homan's sign: Negative bilat     TODAY'S TREATMENT: 03/18/24   S/p R TKA 02/19/24, L TKA 01/08/24    SUBJECTIVE STATEMENT:   Patient reports 2/10 pain affecting R knee; 1/10 pain in L knee. Patient reports feeling more tightness with knee flexion along R knee. Patient reports compliance with her HEP.   AAROM R knee: -3-104 L knee: -3-114   Manual Therapy - for symptom modulation, soft tissue sensitivity and mobility, joint mobility, ROM   Patellar mobilizations (inferior/superior, medial/lateral) x 5 minutes  Scar massage (multi-directional) with no cream; x 5 minutes  R knee PROM with gentle overpressure into flexion/extension;  Therapeutic Exercise - for improved soft tissue flexibility and extensibility as needed for ROM, improved strength as needed to improve performance of CKC activities/functional movements   NuStep; Level 3, x 5 minutes; seat at 7 - for improved soft tissue mobility and increase bilat knee ROM (R TKA most acute)  Supine SAQ; 2 x 8, 3 sec hold, blue bolster under R knee Seated LAQ 2 x 10 with 3 second hold each LE  Total Gym double limb squat, depth  to tolerance; x 20, Level 22  6 forward step taps x 15 each LE  // bars: forward/retro stepping; 4x D/B  -retro step for terminal knee extension  PATIENT EDUCATION: Discussed contribution of scar mobility to ROM, expectations with progress in Hailey [name]. Discussed expectation for post-Tx soreness and pain management at home.   *not today* TRX squats with chair behind patient 2 x 10  Ambulation in gym with SPC x 100' - cues for heel strike - good follow through    PATIENT EDUCATION:  Education details: see above for patient education details Person educated: Patient Education method: Explanation, Demonstration, and Handouts Education comprehension: verbalized understanding and returned demonstration   HOME EXERCISE PROGRAM:  Hailey [name] continuing with established home exercises from TKA packet  -Hailey [name] instructed on propping knee for gravity-assisted extension stretch    ASSESSMENT:  CLINICAL IMPRESSION:   Session focused on scar massage and patellar mobilizations due to notable adhesions in the scar. Also introduced functional BLE strengthening in standing as well as gait training with SPC. Tolerated session well. Patient exhibits improving gait pattern, and she is able to complete home-level gait without AD safely (though she does have gait deviations requiring further intervention). Hailey [name] will continue to benefit from skilled Hailey [name] services to address deficits and improve function.   OBJECTIVE IMPAIRMENTS: Abnormal gait, decreased activity tolerance, decreased mobility, difficulty walking, decreased ROM, decreased strength, hypomobility, impaired flexibility, and pain.   ACTIVITY LIMITATIONS: carrying, lifting, sitting, standing, squatting, stairs, transfers, bed mobility, and locomotion level  PARTICIPATION LIMITATIONS: meal prep, cleaning, driving, shopping, community activity, and occupation  PERSONAL FACTORS: Past/current experiences and 3+ comorbidities: (currently being treated for bacteremia,  ankylosing spondylitis, polyarthralgia, dyslipidemia) are also affecting patient's functional outcome.   REHAB POTENTIAL: Good  CLINICAL DECISION MAKING: Evolving/moderate complexity  EVALUATION COMPLEXITY: Moderate   GOALS: Goals reviewed with patient? Yes  SHORT TERM GOALS: Target date: 03/20/2024  Hailey [name] will be independent with HEP to improve strength and decrease knee pain to improve pain-free function at home and work. Baseline: 03/04/24: Hailey [name] continuing established HEP from hospital/TKA packet; discussed knee extension propped stretch and frequent work on heel slides.  Goal status: INITIAL   LONG TERM GOALS: Target date: 04/24/2024  Hailey [name] will have bilateral knee AROM 0-115 deg at least as needed for performance of ADLs and normalized extension for gait pattern Baseline: 03/04/24: Knee AROM R -4-86, L -2-110 Goal status: INITIAL  2.  Hailey [name] will decrease worst knee pain by at least 3 points on the NPRS in order to demonstrate clinically significant reduction in knee pain. Baseline: 03/04/24: 6/10 pain at worst.  Goal status: INITIAL  3.  Hailey [name] will decrease LEFS score by at least 9 points in order demonstrate clinically significant reduction in knee pain/disability.       Baseline: 03/04/24: 30/80  Goal status: INITIAL  4.  Hailey [name] will increase strength of bilateral hip flexors and quads/hamstrings to at least 4+/5 or greater MMT grade in order to demonstrate improvement in strength and function needed for  transferring and improved lower limb stability.,  Baseline: 03/04/24: MMT 3+ to 4 for aforementioned muscle groups.  Goal status: INITIAL  5.  Hailey [name] will ambulate at community level (660 feet or greater in office) with no AD and symmetrical weightbearing on either LE without major deviations as needed for return to independent mobility and return to work Baseline: 03/04/24: Limited distance tolerated for walking s/p R TKA. Goal status: INITIAL   PLAN: Hailey [name] FREQUENCY: 1-2x/week  Hailey [name] DURATION: 6-8  weeks  PLANNED INTERVENTIONS: Therapeutic exercises, Therapeutic activity, Neuromuscular re-education, Balance training, Gait training, Patient/Family education, Self Care, Joint mobilization, DME instructions, Dry Needling, Electrical stimulation, Cryotherapy, Moist heat, Taping, Manual therapy, and Re-evaluation.  PLAN FOR NEXT SESSION: Knee ROM, quad/HS/hip strengthening, progressive weightbearing and stepping drills to work on weaning toward LRAD and then to no AD.    Hailey [name], Hailey [name], DPT #[id number]  Hailey [name], Hailey [name] 03/18/2024, 3:12 PM

## 2024-03-19 ENCOUNTER — Other Ambulatory Visit (HOSPITAL_COMMUNITY): Payer: Self-pay

## 2024-03-19 ENCOUNTER — Telehealth (HOSPITAL_BASED_OUTPATIENT_CLINIC_OR_DEPARTMENT_OTHER): Payer: Self-pay

## 2024-03-19 DIAGNOSIS — Z5189 Encounter for other specified aftercare: Secondary | ICD-10-CM | POA: Diagnosis not present

## 2024-03-20 ENCOUNTER — Encounter: Payer: Self-pay | Admitting: Internal Medicine

## 2024-03-20 ENCOUNTER — Encounter: Payer: Self-pay | Admitting: Physical Therapy

## 2024-03-20 ENCOUNTER — Other Ambulatory Visit: Payer: Self-pay

## 2024-03-20 ENCOUNTER — Ambulatory Visit: Admitting: Physical Therapy

## 2024-03-20 ENCOUNTER — Ambulatory Visit: Admitting: Internal Medicine

## 2024-03-20 VITALS — BP 123/85 | HR 76 | Temp 98.1°F | Wt 229.0 lb

## 2024-03-20 DIAGNOSIS — M6281 Muscle weakness (generalized): Secondary | ICD-10-CM | POA: Diagnosis not present

## 2024-03-20 DIAGNOSIS — G8918 Other acute postprocedural pain: Secondary | ICD-10-CM

## 2024-03-20 DIAGNOSIS — R300 Dysuria: Secondary | ICD-10-CM

## 2024-03-20 DIAGNOSIS — R262 Difficulty in walking, not elsewhere classified: Secondary | ICD-10-CM | POA: Diagnosis not present

## 2024-03-20 DIAGNOSIS — R7881 Bacteremia: Secondary | ICD-10-CM

## 2024-03-20 DIAGNOSIS — M25661 Stiffness of right knee, not elsewhere classified: Secondary | ICD-10-CM | POA: Diagnosis not present

## 2024-03-20 DIAGNOSIS — M25561 Pain in right knee: Secondary | ICD-10-CM | POA: Diagnosis not present

## 2024-03-20 MED ORDER — HYDROMORPHONE HCL 2 MG PO TABS
2.0000 mg | ORAL_TABLET | Freq: Three times a day (TID) | ORAL | 0 refills | Status: DC | PRN
  Filled 2024-03-20: qty 20, 7d supply, fill #0

## 2024-03-20 NOTE — Progress Notes (Signed)
 Patient ID: Hailey [name], female   DOB: [birth year], 48 y.o.   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 She completed 2 wks of abtx but then received additional abtx since not completely well She has finished oral abtx 2 days ago.  Feels alittle bit better, feels fatigue.   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   aspirin  81 MG chewable tablet Chew 1 tablet (81 mg total) by mouth 2 (two) times daily.   celecoxib  (CELEBREX ) 200 MG capsule Take 1 capsule (200 mg total) by mouth 2 (two) times daily with a meal.   famotidine  (PEPCID ) 20 MG tablet Take 1 tablet (20 mg total) by mouth 2 (two) times daily.   fluticasone -salmeterol (ADVAIR  DISKUS) 100-50 MCG/ACT AEPB Inhale 1 puff into the lungs 2 (two) times daily as needed.   HYDROmorphone  (DILAUDID ) 2 MG tablet Take 2 mg by mouth every 4 (four) hours as needed for severe pain (pain score 7-10).   Multiple Vitamin (MULTIVITAMIN WITH MINERALS) TABS tablet Take 1 tablet by mouth daily.   ondansetron  (ZOFRAN -ODT) 4 MG disintegrating tablet Take 1 tablet by mouth every 6 hours as needed for nausea/vomiting   polyethylene glycol powder (GLYCOLAX /MIRALAX ) 17 GM/SCOOP powder Mix 1 capful (17 grams) in liquid and drink by mouth 2 (two) times daily.   promethazine  (PHENERGAN ) 25 MG tablet Take 1 tablet (25 mg total) by mouth every 6 (six) hours as needed for nausea or vomiting.   SYNTHROID  200 MCG tablet Take 1 tablet (200 mcg total)  by mouth once daily. Take on an empty stomach with a glass of water  at least 30-60 minutes before breakfast.   HYDROmorphone  (DILAUDID ) 2 MG tablet Take 1 tablet (2 mg total) by mouth every 4 (four) hours as needed for severe post op pain.   methocarbamol  (ROBAXIN ) 500 MG tablet Take 1 tablet (500 mg total) by mouth every 6 (six) hours as needed. (Patient not taking: Reported on 03/20/2024)   metoCLOPramide  (REGLAN ) 10 MG tablet Take 1 tablet (10 mg total) by mouth every 6 (six) hours as needed for nausea. (Patient not taking: Reported on 03/20/2024)   promethazine  (PHENERGAN ) 25 MG tablet Take 1 tablet (25 mg total) by mouth every 8 (eight) hours as needed for nausea/vomiting.   tiZANidine  (ZANAFLEX ) 4 MG tablet Take 1 tablet (4 mg total) by mouth at bedtime as needed for muscle spasm or pain (Patient not taking: Reported on 03/20/2024)   No facility-administered encounter medications on file as of 03/20/2024.     Patient Active Problem List   Diagnosis Date Noted   Bacteremia 02/27/2024   S/P total knee arthroplasty, right 02/19/2024   S/P total knee arthroplasty, left 01/08/2024   S/P total knee arthroplasty 01/08/2024   Atypical chest pain 11/23/2023   Preop cardiovascular exam 11/23/2023   Dyslipidemia 11/23/2023   Generalized lymphadenopathy 09/23/2021   B12 deficiency 09/23/2021   Fatigue 09/23/2021   Primary osteoarthritis of both knees 04/29/2021   Ankylosing spondylitis (HCC) 12/30/2018   Asthma in  adult without complication 12/30/2018   Chronic diarrhea 12/30/2018   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   Interstitial cystitis 07/03/2016   Pelvic pain in female 07/03/2016   Chondromalacia of knee, 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   BMI 38.0-38.9,adult 02/14/2016   Polyarthralgia 02/14/2016   GERD (gastroesophageal reflux disease) 02/10/2014     Health Maintenance Due  Topic Date Due   DTaP/Tdap/Td (1 - Tdap) Never done   Pneumococcal Vaccine 40-65 Years old (1 of 2 - PCV) Never done    Hepatitis B Vaccines (1 of 3 - 19+ 3-dose series) Never done   COVID-19 Vaccine (3 - Pfizer risk series) 11/29/2019   MAMMOGRAM  10/05/2022   Cervical Cancer Screening (HPV/Pap Cotest)  04/23/2023     Review of Systems 12 point ros is negative except what is mentioned above. Physical Exam   BP 123/85   Pulse 76   Temp 98.1 F (36.7 C) (Oral)   Wt 229 lb (103.9 kg)   SpO2 95%   BMI 38.11 kg/m    Physical Exam  Constitutional:  oriented to person, place, and time. appears well-developed and well-nourished. No distress.  HENT: [place]/AT, PERRLA, no scleral icterus Mouth/Throat: Oropharynx is clear and moist. No oropharyngeal exudate.  Cardiovascular: Normal rate, regular rhythm and normal heart sounds. Exam reveals no gallop and no friction rub.  No murmur heard.  Pulmonary/Chest: Effort normal and breath sounds normal. No respiratory distress.  has no wheezes.  Neck = supple, no nuchal rigidity Abdominal: Soft. Bowel sounds are normal.  exhibits no distension. There is no tenderness.  Lymphadenopathy: no cervical adenopathy. No axillary adenopathy Neurological: alert and oriented to person, place, and time.  Skin: Skin is warm and dry. No rash noted. No erythema.  Psychiatric: a normal mood and affect.  behavior is normal.   Lab Results  Component Value Date   LABRPR NON REACTIVE 10/20/2021    CBC Lab Results  Component Value Date   WBC 10.9 (H) 03/06/2024   RBC 4.23 03/06/2024   HGB 11.9 (L) 03/06/2024   HCT 37.5 03/06/2024   PLT 477 (H) 03/06/2024   MCV 88.7 03/06/2024   MCH 28.1 03/06/2024   MCHC 31.7 03/06/2024   RDW 14.6 03/06/2024   LYMPHSABS 2.5 03/06/2024   MONOABS 0.7 03/06/2024   EOSABS 0.6 (H) 03/06/2024    BMET Lab Results  Component Value Date   NA 136 03/06/2024   K 4.3 03/06/2024   CL 101 03/06/2024   CO2 24 03/06/2024   GLUCOSE 137 (H) 03/06/2024   BUN 16 03/06/2024   CREATININE 0.65 03/06/2024   CALCIUM 9.7 03/06/2024   GFRNONAA >60  03/06/2024   GFRAA >60 02/05/2020    Lab Results  Component Value Date   ESRSEDRATE 28 (H) 03/20/2024   Lab Results  Component Value Date   CRP 14.8 (H) 03/20/2024   Lab Results  Component Value Date   WBC 10.2 03/27/2024   HGB 12.2 03/27/2024   HCT 36.4 03/27/2024   MCV 83.7 03/27/2024   PLT 471 (H) 03/27/2024     Assessment and Plan History of moraxella bacteremia in setting of uti = she has completed treatment and is feeling better. We will plan on repeating inflammatory labs and blood cx to see if need to continue treatment.  Has element of mild eosinophilia on cbc = possibly related to abtx exposure.   Inflammatory markers are still elevated  TKA = on exam it appears to have warmth but not out of proportion from her surgery  -------------  Addendum: will recheck labs next week to see trend and then touch base with olin if needs repeat aspiration of her new TKA to see if having early infection

## 2024-03-20 NOTE — Therapy (Signed)
 OUTPATIENT PHYSICAL THERAPY POST-OP TREATMENT   Patient Name: Hailey [name] MRN: [id number] DOB:Apr 30, 1976, 48 y.o., female Today's Date:03/20/2024   END OF SESSION:  PT End of Session - 03/20/24 1608     Visit Number 5    Number of Visits 17    Date for PT Re-Evaluation 05/01/24    Authorization Type [employer] [id number]    PT Start Time 1521    PT Stop Time 1600    PT Time Calculation (min) 39 min    Activity Tolerance Patient tolerated treatment well    Behavior During Therapy WFL for tasks assessed/performed              Past Medical History:  Diagnosis Date   Allergic state    Allergy    Ankylosing spondylitis (HCC)    followed by rheumatology Dr. Tobie   Arthritis    Asthma    Cervical disc disease    Chest pain    Depression    Enlarged lymph nodes    GERD (gastroesophageal reflux disease)    H/O colonoscopy    HPV (human papilloma virus) infection    Hypothyroidism    Interstitial cystitis    LGSIL (low grade squamous intraepithelial dysplasia)    Paresthesia    Pre-diabetes    Status post laparoscopic cholecystectomy 07/13/2016   Past Surgical History:  Procedure Laterality Date   BREAST BIOPSY Right 10/18/2015   bx/clip- neg   CHOLECYSTECTOMY N/A 07/07/2016   Procedure: LAPAROSCOPIC CHOLECYSTECTOMY;  Surgeon: Aloysius Plant, MD;  Location: [facility] ORS;  Service: General;  Laterality: N/A;   COLONOSCOPY WITH PROPOFOL  N/A 10/11/2015   Procedure: COLONOSCOPY WITH PROPOFOL ;  Surgeon: Gladis RAYMOND Mariner, MD;  Location: [facility] ENDOSCOPY;  Service: Endoscopy;  Laterality: N/A;   ESOPHAGOGASTRODUODENOSCOPY (EGD) WITH PROPOFOL  N/A 10/11/2015   Procedure: ESOPHAGOGASTRODUODENOSCOPY (EGD) WITH PROPOFOL ;  Surgeon: Gladis RAYMOND Mariner, MD;  Location: [facility] ENDOSCOPY;  Service: Endoscopy;  Laterality: N/A;   TONSILLECTOMY     TOTAL KNEE ARTHROPLASTY Left 01/08/2024   Procedure: LEFT TOTAL KNEE ARTHROPLASTYL;  Surgeon: Ernie Cough, MD;  Location: [facility] ORS;  Service: Orthopedics;   Laterality: Left;   TOTAL KNEE ARTHROPLASTY Right 02/19/2024   Procedure: ARTHROPLASTY, KNEE, TOTAL;  Surgeon: Ernie Cough, MD;  Location: [facility] ORS;  Service: Orthopedics;  Laterality: Right;   TOTAL THYROIDECTOMY  2005   goiter   Patient Active Problem List   Diagnosis Date Noted   Bacteremia 02/27/2024   S/P total knee arthroplasty, right 02/19/2024   S/P total knee arthroplasty, left 01/08/2024   S/P total knee arthroplasty 01/08/2024   Atypical chest pain 11/23/2023   Preop cardiovascular exam 11/23/2023   Dyslipidemia 11/23/2023   Generalized lymphadenopathy 09/23/2021   B12 deficiency 09/23/2021   Fatigue 09/23/2021   Primary osteoarthritis of both knees 04/29/2021   Ankylosing spondylitis (HCC) 12/30/2018   Asthma in adult without complication 12/30/2018   Chronic diarrhea 12/30/2018   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   Interstitial cystitis 07/03/2016   Pelvic pain in female 07/03/2016   Chondromalacia of knee, 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   BMI 38.0-38.9,adult 02/14/2016   Polyarthralgia 02/14/2016   GERD (gastroesophageal reflux disease) 02/10/2014    PCP: Justus Leita DEL, MD  REFERRING PROVIDER: Patti Rosina SAUNDERS, PA-C  REFERRING DIAG: M17.11 (ICD-10-CM) - Unilateral primary osteoarthritis, right knee   RATIONALE FOR EVALUATION AND TREATMENT: Rehabilitation  THERAPY DIAG: Acute postoperative pain of right knee  Stiffness of right knee, not elsewhere classified  Difficulty in walking, not elsewhere classified  Muscle weakness (generalized)  ONSET DATE: R TKA 02/19/24, Hx L TKA 01/08/24  FOLLOW-UP APPT SCHEDULED WITH REFERRING PROVIDER: Yes ; pt f/u with surgeon 03/06/24   PERTINENT HISTORY: Pt is a 48 year old female known to this clinic with Hx of L TKA 01/08/24 who underwent post-op rehab with good ROM progress. Pt is currently s/p R TKA 02/19/24.   Pt has unfortunately had significant challenges with early post-op phase during both knee replacements. Pt had ED  visits on 6/13 and 6/14 related to nausea and weakness following Sx. Infection, CVD, PE, and acute process in abdomen/pelvis ruled out. Pt had additional hospital admission on 02/27/24 due to bacteremia. She was told infection was possibly due to Foley catheter. Pt is following up with Dr. Justus this Friday to f/u on infection status.   Pt had second culture without significant bacterial growth after initiating antibiotic. Patient reports some ongoing dysuria; pt thinks she may have had hematuria. Patient reports bilateral knee tightness/stiffness. Pt was told by acute care PT she is somewhat behind on ROM. Pt reports intermittent buckling into extension with longer stride. No concerning S&S along post-op incision.    PAIN:   Pain Intensity: Present: 2/10, Best: 2/10, Worst: 6/10 as if week of IE Pain location: Peri-incisional pain Pain quality: aching  Radiating pain: some muscle pain in calf  Swelling: Yes  Numbness/Tingling: Yes; around incision and tibial plateau region  Focal weakness or buckling: Yes; pt buckling into hyperextension Aggravating factors: trying to sleep/lie down, maintaining R knee straight/fully extended Relieving factors: ice, exercise/stretching  24-hour pain behavior: worse in AM, later in evenig  History of prior back, hip, or knee injury, pain, surgery, or therapy: Yes; hx of L TKA 01/08/24, R 02/19/24  Imaging: Yes ; post-op X-ray, swelling noted but no infection ruled in  Prior level of function: Independent Occupational demands: CT tech, X-ray tech; OR and dental imaging; substantial time walking/standing  Hobbies: going to lake - boating; walking dog/walking exercise  Red flags: Negative for personal history of cancer, chills/fever, night sweats, nausea, vomiting, unexplained weight gain/loss, unrelenting pain  PRECAUTIONS: None  WEIGHT BEARING RESTRICTIONS: WBAT  FALLS: Has patient fallen in last 6 months? No  Living Environment Lives with: lives  with their spouse and lives with their daughter 447 y/o daughter) Lives in: House/apartment No steps to get into home; one-level home, handicap accessible, shower seat  Patient Goals: Able to return to work    OBJECTIVE:   Patient Surveys  LEFS = 30/80 = 37.5%    Gross Musculoskeletal Assessment Tremor: None Bulk: Atrophy of R>L quadriceps, moderate Tone: Normal  GAIT: Distance walked: 80 ft Assistive device utilized: Environmental consultant - 2 wheeled Level of assistance: SBA Comments: Moderate dec terminal knee extension with swing phase   AROM AROM (Normal range in degrees) AROM 03/04/24  Hip Right Left  Flexion (125)    Extension (15)    Abduction (40)    Adduction     Internal Rotation (45)    External Rotation (45)        Knee    Flexion (135) 86 110  Extension (0) -4 -2      Ankle    Dorsiflexion (20) 8 8  Plantarflexion (50) WNL WNL  Inversion (35)    Eversion (15    (* = pain; Blank rows = not tested)  LE MMT: MMT (out of 5) Right 03/04/24 Left 03/04/24  Hip flexion 4- 4  Hip extension    Hip abduction (seated) 5 5  Hip adduction (  seated) 5 5  Hip internal rotation    Hip external rotation    Knee flexion 4- 4  Knee extension 3+ 4+  Ankle dorsiflexion    Ankle plantarflexion    Ankle inversion    Ankle eversion    (* = pain; Blank rows = not tested)  Sensation Deferred  Reflexes Deferred  Palpation Location LEFT  RIGHT           Quadriceps  1  Medial Hamstrings    Lateral Hamstrings    Lateral Hamstring tendon    Medial Hamstring tendon    Quadriceps tendon    Patella  1  Patellar Tendon  1  Tibial Tuberosity  2  Medial joint line 1 2  Lateral joint line 1 1  MCL    LCL    Adductor Tubercle    Pes Anserine tendon    Infrapatellar fat pad    Fibular head    Popliteal fossa    (Blank rows = not tested) Graded on 0-4 scale (0 = no pain, 1 = pain, 2 = pain with wincing/grimacing/flinching, 3 = pain with withdrawal, 4 = unwilling to allow  palpation), (Blank rows = not tested)  Passive Accessory Motion Deferred   VASCULAR Dorsalis pedis and posterior tibial pulses are palpable No sign of acute DVT Homan's sign: Negative bilat     TODAY'S TREATMENT: 03/20/24   S/p R TKA 02/19/24, L TKA 01/08/24    SUBJECTIVE STATEMENT:   Patient reports feeling notably stiff at arrival today. She reports notable pain after last visit that was mitigated with ice and Tylenol . Pt is able to go without cane some in her home. She uses [employer] for community-level mobility.  AAROM R knee: -2-105 L knee: -3-114   Manual Therapy - for symptom modulation, soft tissue sensitivity and mobility, joint mobility, ROM   Patellar mobilizations (inferior/superior, medial/lateral) x 5 minutes  Scar massage (multi-directional) with no cream; x 5 minutes  R knee PROM with gentle overpressure into flexion/extension; x 3 minutes   Therapeutic Exercise - for improved soft tissue flexibility and extensibility as needed for ROM, improved strength as needed to improve performance of CKC activities/functional movements   NuStep; Level 3, x 5 minutes; seat at 7 - for improved soft tissue mobility and increase bilat knee ROM (R TKA most acute)  Supine SAQ; 2 x 8, 3 sec hold, blue bolster under R knee Seated LAQ 2 x 10 with 3 second hold each LE   Therapeutic Activities - closed-chain movements mimicking functional activities/ADLs  Sit to stand; low mat at lowest setting; 2 x 10  Ambulate laps with no AD; x 3 laps around gym  -cueing for heel strike and ensuring full extension at terminal swing  Stairs; 4-step staircase in center of gym; up/down with reciprocal pattern and BUE support; x 3  -pt opts for step-to pattern during final trial of stair descent // bars: Minisquat; 2 x 10 // bars: forward/retro stepping; 4x D/B  -retro step for terminal knee extension  PATIENT EDUCATION: Discussed current progress with PT.    *not today* Total  Gym double limb squat, depth to tolerance; x 20, Level 22 TRX squats with chair behind patient 2 x 10  Ambulation in gym with SPC x 100' - cues for heel strike - good follow through    PATIENT EDUCATION:  Education details: see above for patient education details Person educated: Patient Education method: Explanation, Demonstration, and Handouts Education comprehension: verbalized understanding and returned  demonstration   HOME EXERCISE PROGRAM:  Pt continuing with established home exercises from TKA packet  -pt instructed on propping knee for gravity-assisted extension stretch    ASSESSMENT:  CLINICAL IMPRESSION:   Pt still has mild to moderate ROM deficits and R>L knee stiffness. L knee is healing well and is almost [facility] for flexion ROM. Pt is improving in regard to gait quality and ability to complete CKC tasks and stair negotiation. She is more challenged with single-limb lowering required for stair descent. Pt will continue to benefit from skilled PT services to address deficits and improve function.   OBJECTIVE IMPAIRMENTS: Abnormal gait, decreased activity tolerance, decreased mobility, difficulty walking, decreased ROM, decreased strength, hypomobility, impaired flexibility, and pain.   ACTIVITY LIMITATIONS: carrying, lifting, sitting, standing, squatting, stairs, transfers, bed mobility, and locomotion level  PARTICIPATION LIMITATIONS: meal prep, cleaning, driving, shopping, community activity, and occupation  PERSONAL FACTORS: Past/current experiences and 3+ comorbidities: (currently being treated for bacteremia, ankylosing spondylitis, polyarthralgia, dyslipidemia) are also affecting patient's functional outcome.   REHAB POTENTIAL: Good  CLINICAL DECISION MAKING: Evolving/moderate complexity  EVALUATION COMPLEXITY: Moderate   GOALS: Goals reviewed with patient? Yes  SHORT TERM GOALS: Target date: 03/20/2024  Pt will be independent with HEP to improve strength and  decrease knee pain to improve pain-free function at home and work. Baseline: 03/04/24: Pt continuing established HEP from hospital/TKA packet; discussed knee extension propped stretch and frequent work on heel slides.  Goal status: INITIAL   LONG TERM GOALS: Target date: 04/24/2024  Pt will have bilateral knee AROM 0-115 deg at least as needed for performance of ADLs and normalized extension for gait pattern Baseline: 03/04/24: Knee AROM R -4-86, L -2-110 Goal status: INITIAL  2.  Pt will decrease worst knee pain by at least 3 points on the NPRS in order to demonstrate clinically significant reduction in knee pain. Baseline: 03/04/24: 6/10 pain at worst.  Goal status: INITIAL  3.  Pt will decrease LEFS score by at least 9 points in order demonstrate clinically significant reduction in knee pain/disability.       Baseline: 03/04/24: 30/80  Goal status: INITIAL  4.  Pt will increase strength of bilateral hip flexors and quads/hamstrings to at least 4+/5 or greater MMT grade in order to demonstrate improvement in strength and function needed for transferring and improved lower limb stability.,  Baseline: 03/04/24: MMT 3+ to 4 for aforementioned muscle groups.  Goal status: INITIAL  5.  Pt will ambulate at community level (660 feet or greater in office) with no AD and symmetrical weightbearing on either LE without major deviations as needed for return to independent mobility and return to work Baseline: 03/04/24: Limited distance tolerated for walking s/p R TKA. Goal status: INITIAL   PLAN: PT FREQUENCY: 1-2x/week  PT DURATION: 6-8 weeks  PLANNED INTERVENTIONS: Therapeutic exercises, Therapeutic activity, Neuromuscular re-education, Balance training, Gait training, Patient/Family education, Self Care, Joint mobilization, DME instructions, Dry Needling, Electrical stimulation, Cryotherapy, Moist heat, Taping, Manual therapy, and Re-evaluation.  PLAN FOR NEXT SESSION: Knee ROM, quad/HS/hip  strengthening, progressive weightbearing and stepping drills to work on weaning toward LRAD and then to no AD.    Venetia Endo, PT, DPT #[id number]  Venetia ONEIDA Endo, PT 03/20/2024, 4:09 PM

## 2024-03-21 ENCOUNTER — Encounter: Payer: Self-pay | Admitting: Physical Therapy

## 2024-03-25 ENCOUNTER — Ambulatory Visit: Admitting: Physical Therapy

## 2024-03-25 DIAGNOSIS — M25661 Stiffness of right knee, not elsewhere classified: Secondary | ICD-10-CM

## 2024-03-25 DIAGNOSIS — R262 Difficulty in walking, not elsewhere classified: Secondary | ICD-10-CM | POA: Diagnosis not present

## 2024-03-25 DIAGNOSIS — M6281 Muscle weakness (generalized): Secondary | ICD-10-CM | POA: Diagnosis not present

## 2024-03-25 DIAGNOSIS — G8918 Other acute postprocedural pain: Secondary | ICD-10-CM | POA: Diagnosis not present

## 2024-03-25 DIAGNOSIS — M25561 Pain in right knee: Secondary | ICD-10-CM | POA: Diagnosis not present

## 2024-03-25 NOTE — Therapy (Unsigned)
 OUTPATIENT PHYSICAL THERAPY POST-OP TREATMENT   Patient Name: Hailey [name] MRN: [id number] DOB:December 01, 1975, 48 y.o., female Today's Date:03/25/2024   END OF SESSION:  PT End of Session - 03/25/24 1517     Visit Number 6    Number of Visits 17    Date for PT Re-Evaluation 05/01/24    Authorization Type [employer] [id number]    PT Start Time 1517    PT Stop Time 1558    PT Time Calculation (min) 41 min    Activity Tolerance Patient tolerated treatment well    Behavior During Therapy [facility] for tasks assessed/performed           Past Medical History:  Diagnosis Date   Allergic state    Allergy    Ankylosing spondylitis (HCC)    followed by rheumatology Dr. Tobie   Arthritis    Asthma    Cervical disc disease    Chest pain    Depression    Enlarged lymph nodes    GERD (gastroesophageal reflux disease)    H/O colonoscopy    HPV (human papilloma virus) infection    Hypothyroidism    Interstitial cystitis    LGSIL (low grade squamous intraepithelial dysplasia)    Paresthesia    Pre-diabetes    Status post laparoscopic cholecystectomy 07/13/2016   Past Surgical History:  Procedure Laterality Date   BREAST BIOPSY Right 10/18/2015   bx/clip- neg   CHOLECYSTECTOMY N/A 07/07/2016   Procedure: LAPAROSCOPIC CHOLECYSTECTOMY;  Surgeon: Aloysius Plant, MD;  Location: [facility] ORS;  Service: General;  Laterality: N/A;   COLONOSCOPY WITH PROPOFOL  N/A 10/11/2015   Procedure: COLONOSCOPY WITH PROPOFOL ;  Surgeon: Gladis RAYMOND Mariner, MD;  Location: [facility] ENDOSCOPY;  Service: Endoscopy;  Laterality: N/A;   ESOPHAGOGASTRODUODENOSCOPY (EGD) WITH PROPOFOL  N/A 10/11/2015   Procedure: ESOPHAGOGASTRODUODENOSCOPY (EGD) WITH PROPOFOL ;  Surgeon: Gladis RAYMOND Mariner, MD;  Location: [facility] ENDOSCOPY;  Service: Endoscopy;  Laterality: N/A;   TONSILLECTOMY     TOTAL KNEE ARTHROPLASTY Left 01/08/2024   Procedure: LEFT TOTAL KNEE ARTHROPLASTYL;  Surgeon: Ernie Cough, MD;  Location: [facility] ORS;  Service: Orthopedics;   Laterality: Left;   TOTAL KNEE ARTHROPLASTY Right 02/19/2024   Procedure: ARTHROPLASTY, KNEE, TOTAL;  Surgeon: Ernie Cough, MD;  Location: [facility] ORS;  Service: Orthopedics;  Laterality: Right;   TOTAL THYROIDECTOMY  2005   goiter   Patient Active Problem List   Diagnosis Date Noted   Bacteremia 02/27/2024   S/P total knee arthroplasty, right 02/19/2024   S/P total knee arthroplasty, left 01/08/2024   S/P total knee arthroplasty 01/08/2024   Atypical chest pain 11/23/2023   Preop cardiovascular exam 11/23/2023   Dyslipidemia 11/23/2023   Generalized lymphadenopathy 09/23/2021   B12 deficiency 09/23/2021   Fatigue 09/23/2021   Primary osteoarthritis of both knees 04/29/2021   Ankylosing spondylitis (HCC) 12/30/2018   Asthma in adult without complication 12/30/2018   Chronic diarrhea 12/30/2018   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   Interstitial cystitis 07/03/2016   Pelvic pain in female 07/03/2016   Chondromalacia of knee, 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   BMI 38.0-38.9,adult 02/14/2016   Polyarthralgia 02/14/2016   GERD (gastroesophageal reflux disease) 02/10/2014    PCP: Justus Leita DEL, MD  REFERRING PROVIDER: Patti Rosina SAUNDERS, PA-C  REFERRING DIAG: M17.11 (ICD-10-CM) - Unilateral primary osteoarthritis, right knee   RATIONALE FOR EVALUATION AND TREATMENT: Rehabilitation  THERAPY DIAG: Acute postoperative pain of right knee  Stiffness of right knee, not elsewhere classified  Difficulty in walking, not elsewhere classified  Muscle weakness (generalized)  ONSET DATE:  R TKA 02/19/24, Hx L TKA 01/08/24  FOLLOW-UP APPT SCHEDULED WITH REFERRING PROVIDER: Yes ; pt f/u with surgeon 03/06/24   PERTINENT HISTORY: Pt is a 48 year old female known to this clinic with Hx of L TKA 01/08/24 who underwent post-op rehab with good ROM progress. Pt is currently s/p R TKA 02/19/24.   Pt has unfortunately had significant challenges with early post-op phase during both knee replacements. Pt had ED  visits on 6/13 and 6/14 related to nausea and weakness following Sx. Infection, CVD, PE, and acute process in abdomen/pelvis ruled out. Pt had additional hospital admission on 02/27/24 due to bacteremia. She was told infection was possibly due to Foley catheter. Pt is following up with Dr. Justus this Friday to f/u on infection status.   Pt had second culture without significant bacterial growth after initiating antibiotic. Patient reports some ongoing dysuria; pt thinks she may have had hematuria. Patient reports bilateral knee tightness/stiffness. Pt was told by acute care PT she is somewhat behind on ROM. Pt reports intermittent buckling into extension with longer stride. No concerning S&S along post-op incision.    PAIN:   Pain Intensity: Present: 2/10, Best: 2/10, Worst: 6/10 as if week of IE Pain location: Peri-incisional pain Pain quality: aching  Radiating pain: some muscle pain in calf  Swelling: Yes  Numbness/Tingling: Yes; around incision and tibial plateau region  Focal weakness or buckling: Yes; pt buckling into hyperextension Aggravating factors: trying to sleep/lie down, maintaining R knee straight/fully extended Relieving factors: ice, exercise/stretching  24-hour pain behavior: worse in AM, later in evenig  History of prior back, hip, or knee injury, pain, surgery, or therapy: Yes; hx of L TKA 01/08/24, R 02/19/24  Imaging: Yes ; post-op X-ray, swelling noted but no infection ruled in  Prior level of function: Independent Occupational demands: CT tech, X-ray tech; OR and dental imaging; substantial time walking/standing  Hobbies: going to lake - boating; walking dog/walking exercise  Red flags: Negative for personal history of cancer, chills/fever, night sweats, nausea, vomiting, unexplained weight gain/loss, unrelenting pain  PRECAUTIONS: None  WEIGHT BEARING RESTRICTIONS: WBAT  FALLS: Has patient fallen in last 6 months? No  Living Environment Lives with: lives  with their spouse and lives with their daughter 48 y/o daughter) Lives in: House/apartment No steps to get into home; one-level home, handicap accessible, shower seat  Patient Goals: Able to return to work    OBJECTIVE:   Patient Surveys  LEFS = 30/80 = 37.5%    Gross Musculoskeletal Assessment Tremor: None Bulk: Atrophy of R>L quadriceps, moderate Tone: Normal  GAIT: Distance walked: 80 ft Assistive device utilized: Environmental consultant - 2 wheeled Level of assistance: SBA Comments: Moderate dec terminal knee extension with swing phase   AROM AROM (Normal range in degrees) AROM 03/04/24  Hip Right Left  Flexion (125)    Extension (15)    Abduction (40)    Adduction     Internal Rotation (45)    External Rotation (45)        Knee    Flexion (135) 86 110  Extension (0) -4 -2      Ankle    Dorsiflexion (20) 8 8  Plantarflexion (50) WNL WNL  Inversion (35)    Eversion (15    (* = pain; Blank rows = not tested)  LE MMT: MMT (out of 5) Right 03/04/24 Left 03/04/24  Hip flexion 4- 4  Hip extension    Hip abduction (seated) 5 5  Hip adduction (seated) 5  5  Hip internal rotation    Hip external rotation    Knee flexion 4- 4  Knee extension 3+ 4+  Ankle dorsiflexion    Ankle plantarflexion    Ankle inversion    Ankle eversion    (* = pain; Blank rows = not tested)  Sensation Deferred  Reflexes Deferred  Palpation Location LEFT  RIGHT           Quadriceps  1  Medial Hamstrings    Lateral Hamstrings    Lateral Hamstring tendon    Medial Hamstring tendon    Quadriceps tendon    Patella  1  Patellar Tendon  1  Tibial Tuberosity  2  Medial joint line 1 2  Lateral joint line 1 1  MCL    LCL    Adductor Tubercle    Pes Anserine tendon    Infrapatellar fat pad    Fibular head    Popliteal fossa    (Blank rows = not tested) Graded on 0-4 scale (0 = no pain, 1 = pain, 2 = pain with wincing/grimacing/flinching, 3 = pain with withdrawal, 4 = unwilling to allow  palpation), (Blank rows = not tested)  Passive Accessory Motion Deferred   VASCULAR Dorsalis pedis and posterior tibial pulses are palpable No sign of acute DVT Homan's sign: Negative bilat     TODAY'S TREATMENT: 03/25/24   S/p R TKA 02/19/24, L TKA 01/08/24    SUBJECTIVE STATEMENT:   Patient reports 4-5/10 pain at arrival. Pt reports notable pain and lack of sleep after mowing the lawn and completing 5 loads of laundry yesterday. Patient reports minimal L knee pain with activity - she states R knee hurt mainly with higher-volume activity yesterday.   AAROM R knee: -3-109 L knee: -5-114   Manual Therapy - for symptom modulation, soft tissue sensitivity and mobility, joint mobility, ROM   Patellar mobilizations (inferior/superior, medial/lateral) x 5 minutes  Scar massage (multi-directional) with no cream; x 5 minutes  R knee PROM with gentle overpressure into flexion/extension; x 3 minutes   Therapeutic Exercise - for improved soft tissue flexibility and extensibility as needed for ROM, improved strength as needed to improve performance of CKC activities/functional movements   NuStep; Level 4, x 5 minutes; seat at 7 - for improved soft tissue mobility and increase bilat knee ROM (R TKA most acute)  Supine SAQ; 2 x 10, 3 sec hold, blue bolster under R knee, 3-lb ankle weight Seated LAQ, 3-lb ankle weight; 2 x 10 with 3 second hold each LE   Therapeutic Activities - closed-chain movements mimicking functional activities/ADLs  Sit to stand; low mat at lowest setting; 2 x 10  Ambulate laps in hallway; 24 ft; up/down hall x 2  TRX squats with chair behind patient 1 x 10, depth to tolerance   PATIENT EDUCATION: Discussed current progress with PT.    *not today* // bars: Minisquat; 2 x 10 // bars: forward/retro stepping; 4x D/B  -retro step for terminal knee extension Stairs; 4-step staircase in center of gym; up/down with reciprocal pattern and BUE support; x  3  -pt opts for step-to pattern during final trial of stair descent Total Gym double limb squat, depth to tolerance; x 20, Level 22 Ambulation in gym with SPC x 100' - cues for heel strike - good follow through    PATIENT EDUCATION:  Education details: see above for patient education details Person educated: Patient Education method: Explanation, Demonstration, and Handouts Education comprehension: verbalized understanding and  returned demonstration   HOME EXERCISE PROGRAM:  Pt continuing with established home exercises from TKA packet  -pt instructed on propping knee for gravity-assisted extension stretch    ASSESSMENT:  CLINICAL IMPRESSION:   Pt is continuing to improve gradually with ROM; she has minimal deficit with R knee and moderate flexion deficit with L knee. Patient had notable flare-up of pain with lawn work and prolonged standing/walking activity yesterday. Pt has limited tolerance of closed-chain work today. She exhibits improving gait pattern and normalizing weight shift to each LE during stance phase. Pt will continue to benefit from skilled PT services to address deficits and improve function.   OBJECTIVE IMPAIRMENTS: Abnormal gait, decreased activity tolerance, decreased mobility, difficulty walking, decreased ROM, decreased strength, hypomobility, impaired flexibility, and pain.   ACTIVITY LIMITATIONS: carrying, lifting, sitting, standing, squatting, stairs, transfers, bed mobility, and locomotion level  PARTICIPATION LIMITATIONS: meal prep, cleaning, driving, shopping, community activity, and occupation  PERSONAL FACTORS: Past/current experiences and 3+ comorbidities: (currently being treated for bacteremia, ankylosing spondylitis, polyarthralgia, dyslipidemia) are also affecting patient's functional outcome.   REHAB POTENTIAL: Good  CLINICAL DECISION MAKING: Evolving/moderate complexity  EVALUATION COMPLEXITY: Moderate   GOALS: Goals reviewed with patient?  Yes  SHORT TERM GOALS: Target date: 03/20/2024  Pt will be independent with HEP to improve strength and decrease knee pain to improve pain-free function at home and work. Baseline: 03/04/24: Pt continuing established HEP from hospital/TKA packet; discussed knee extension propped stretch and frequent work on heel slides.  Goal status: INITIAL   LONG TERM GOALS: Target date: 04/24/2024  Pt will have bilateral knee AROM 0-115 deg at least as needed for performance of ADLs and normalized extension for gait pattern Baseline: 03/04/24: Knee AROM R -4-86, L -2-110 Goal status: INITIAL  2.  Pt will decrease worst knee pain by at least 3 points on the NPRS in order to demonstrate clinically significant reduction in knee pain. Baseline: 03/04/24: 6/10 pain at worst.  Goal status: INITIAL  3.  Pt will decrease LEFS score by at least 9 points in order demonstrate clinically significant reduction in knee pain/disability.       Baseline: 03/04/24: 30/80  Goal status: INITIAL  4.  Pt will increase strength of bilateral hip flexors and quads/hamstrings to at least 4+/5 or greater MMT grade in order to demonstrate improvement in strength and function needed for transferring and improved lower limb stability.,  Baseline: 03/04/24: MMT 3+ to 4 for aforementioned muscle groups.  Goal status: INITIAL  5.  Pt will ambulate at community level (660 feet or greater in office) with no AD and symmetrical weightbearing on either LE without major deviations as needed for return to independent mobility and return to work Baseline: 03/04/24: Limited distance tolerated for walking s/p R TKA. Goal status: INITIAL   PLAN: PT FREQUENCY: 1-2x/week  PT DURATION: 6-8 weeks  PLANNED INTERVENTIONS: Therapeutic exercises, Therapeutic activity, Neuromuscular re-education, Balance training, Gait training, Patient/Family education, Self Care, Joint mobilization, DME instructions, Dry Needling, Electrical stimulation, Cryotherapy,  Moist heat, Taping, Manual therapy, and Re-evaluation.  PLAN FOR NEXT SESSION: Knee ROM, quad/HS/hip strengthening, progressive weightbearing and stepping drills to work on weaning toward LRAD and then to no AD.    Venetia Endo, PT, DPT #[id number]  Venetia ONEIDA Endo, PT 03/25/2024, 3:17 PM

## 2024-03-26 ENCOUNTER — Encounter: Payer: Self-pay | Admitting: Physical Therapy

## 2024-03-26 ENCOUNTER — Encounter: Payer: Self-pay | Admitting: Internal Medicine

## 2024-03-26 LAB — COMPREHENSIVE METABOLIC PANEL WITH GFR
AG Ratio: 1.6 (calc) (ref 1.0–2.5)
ALT: 17 U/L (ref 6–29)
AST: 17 U/L (ref 10–35)
Albumin: 4.9 g/dL (ref 3.6–5.1)
Alkaline phosphatase (APISO): 136 U/L — ABNORMAL HIGH (ref 31–125)
BUN: 12 mg/dL (ref 7–25)
CO2: 24 mmol/L (ref 20–32)
Calcium: 10.1 mg/dL (ref 8.6–10.2)
Chloride: 103 mmol/L (ref 98–110)
Creat: 0.67 mg/dL (ref 0.50–0.99)
Globulin: 3 g/dL (ref 1.9–3.7)
Glucose, Bld: 102 mg/dL — ABNORMAL HIGH (ref 65–99)
Potassium: 4.7 mmol/L (ref 3.5–5.3)
Sodium: 138 mmol/L (ref 135–146)
Total Bilirubin: 0.4 mg/dL (ref 0.2–1.2)
Total Protein: 7.9 g/dL (ref 6.1–8.1)
eGFR: 108 mL/min/1.73m2

## 2024-03-26 LAB — URINE CULTURE
MICRO NUMBER:: 16685577
Result:: NO GROWTH
SPECIMEN QUALITY:: ADEQUATE

## 2024-03-26 LAB — CULTURE, BLOOD (SINGLE)
MICRO NUMBER:: 16683235
MICRO NUMBER:: 16683236
Result:: NO GROWTH
Result:: NO GROWTH
SPECIMEN QUALITY:: ADEQUATE
SPECIMEN QUALITY:: ADEQUATE

## 2024-03-26 LAB — URINALYSIS, ROUTINE W REFLEX MICROSCOPIC
Bilirubin Urine: NEGATIVE
Glucose, UA: NEGATIVE
Hgb urine dipstick: NEGATIVE
Ketones, ur: NEGATIVE
Leukocytes,Ua: NEGATIVE
Nitrite: NEGATIVE
Protein, ur: NEGATIVE
Specific Gravity, Urine: 1.023 (ref 1.001–1.035)
pH: 5.5 (ref 5.0–8.0)

## 2024-03-26 LAB — C-REACTIVE PROTEIN: CRP: 14.8 mg/L — ABNORMAL HIGH (ref <8.0)

## 2024-03-26 LAB — SEDIMENTATION RATE: Sed Rate: 28 mm/h — ABNORMAL HIGH (ref 0–20)

## 2024-03-27 ENCOUNTER — Encounter: Payer: Self-pay | Admitting: Physical Therapy

## 2024-03-27 ENCOUNTER — Ambulatory Visit: Admitting: Physical Therapy

## 2024-03-27 ENCOUNTER — Other Ambulatory Visit
Admission: RE | Admit: 2024-03-27 | Discharge: 2024-03-27 | Disposition: A | Discharge status: Home / Self Care | Attending: Internal Medicine | Admitting: Internal Medicine

## 2024-03-27 DIAGNOSIS — G8918 Other acute postprocedural pain: Secondary | ICD-10-CM

## 2024-03-27 DIAGNOSIS — R7881 Bacteremia: Secondary | ICD-10-CM | POA: Diagnosis not present

## 2024-03-27 DIAGNOSIS — R262 Difficulty in walking, not elsewhere classified: Secondary | ICD-10-CM | POA: Diagnosis not present

## 2024-03-27 DIAGNOSIS — M25561 Pain in right knee: Secondary | ICD-10-CM | POA: Diagnosis not present

## 2024-03-27 DIAGNOSIS — M6281 Muscle weakness (generalized): Secondary | ICD-10-CM

## 2024-03-27 DIAGNOSIS — M25661 Stiffness of right knee, not elsewhere classified: Secondary | ICD-10-CM

## 2024-03-27 LAB — CBC WITH DIFFERENTIAL/PLATELET
Abs Immature Granulocytes: 0.02 K/uL (ref 0.00–0.07)
Basophils Absolute: 0.1 K/uL (ref 0.0–0.1)
Basophils Relative: 1 %
Eosinophils Absolute: 0.6 K/uL — ABNORMAL HIGH (ref 0.0–0.5)
Eosinophils Relative: 6 %
HCT: 36.4 % (ref 36.0–46.0)
Hemoglobin: 12.2 g/dL (ref 12.0–15.0)
Immature Granulocytes: 0 %
Lymphocytes Relative: 35 %
Lymphs Abs: 3.6 K/uL (ref 0.7–4.0)
MCH: 28 pg (ref 26.0–34.0)
MCHC: 33.5 g/dL (ref 30.0–36.0)
MCV: 83.7 fL (ref 80.0–100.0)
Monocytes Absolute: 0.9 K/uL (ref 0.1–1.0)
Monocytes Relative: 8 %
Neutro Abs: 5 K/uL (ref 1.7–7.7)
Neutrophils Relative %: 50 %
Platelets: 471 K/uL — ABNORMAL HIGH (ref 150–400)
RBC: 4.35 MIL/uL (ref 3.87–5.11)
RDW: 14.2 % (ref 11.5–15.5)
WBC: 10.2 K/uL (ref 4.0–10.5)
nRBC: 0 % (ref 0.0–0.2)

## 2024-03-27 NOTE — Therapy (Signed)
 OUTPATIENT PHYSICAL THERAPY POST-OP TREATMENT   Patient Name: Hailey [name] MRN: [id number] DOB:July 15, 1976, 48 y.o., female Today's Date:03/27/2024   END OF SESSION:  PT End of Session - 03/27/24 1546     Visit Number 7    Number of Visits 17    Date for PT Re-Evaluation 05/01/24    Authorization Type [employer] [id number]    PT Start Time 1519    PT Stop Time 1600    PT Time Calculation (min) 41 min    Activity Tolerance Patient tolerated treatment well    Behavior During Therapy WFL for tasks assessed/performed            Past Medical History:  Diagnosis Date   Allergic state    Allergy    Ankylosing spondylitis (HCC)    followed by rheumatology Dr. Tobie   Arthritis    Asthma    Cervical disc disease    Chest pain    Depression    Enlarged lymph nodes    GERD (gastroesophageal reflux disease)    H/O colonoscopy    HPV (human papilloma virus) infection    Hypothyroidism    Interstitial cystitis    LGSIL (low grade squamous intraepithelial dysplasia)    Paresthesia    Pre-diabetes    Status post laparoscopic cholecystectomy 07/13/2016   Past Surgical History:  Procedure Laterality Date   BREAST BIOPSY Right 10/18/2015   bx/clip- neg   CHOLECYSTECTOMY N/A 07/07/2016   Procedure: LAPAROSCOPIC CHOLECYSTECTOMY;  Surgeon: Aloysius Plant, MD;  Location: [facility] ORS;  Service: General;  Laterality: N/A;   COLONOSCOPY WITH PROPOFOL  N/A 10/11/2015   Procedure: COLONOSCOPY WITH PROPOFOL ;  Surgeon: Gladis RAYMOND Mariner, MD;  Location: [facility] ENDOSCOPY;  Service: Endoscopy;  Laterality: N/A;   ESOPHAGOGASTRODUODENOSCOPY (EGD) WITH PROPOFOL  N/A 10/11/2015   Procedure: ESOPHAGOGASTRODUODENOSCOPY (EGD) WITH PROPOFOL ;  Surgeon: Gladis RAYMOND Mariner, MD;  Location: [facility] ENDOSCOPY;  Service: Endoscopy;  Laterality: N/A;   TONSILLECTOMY     TOTAL KNEE ARTHROPLASTY Left 01/08/2024   Procedure: LEFT TOTAL KNEE ARTHROPLASTYL;  Surgeon: Ernie Cough, MD;  Location: [facility] ORS;  Service: Orthopedics;   Laterality: Left;   TOTAL KNEE ARTHROPLASTY Right 02/19/2024   Procedure: ARTHROPLASTY, KNEE, TOTAL;  Surgeon: Ernie Cough, MD;  Location: [facility] ORS;  Service: Orthopedics;  Laterality: Right;   TOTAL THYROIDECTOMY  2005   goiter   Patient Active Problem List   Diagnosis Date Noted   Bacteremia 02/27/2024   S/P total knee arthroplasty, right 02/19/2024   S/P total knee arthroplasty, left 01/08/2024   S/P total knee arthroplasty 01/08/2024   Atypical chest pain 11/23/2023   Preop cardiovascular exam 11/23/2023   Dyslipidemia 11/23/2023   Generalized lymphadenopathy 09/23/2021   B12 deficiency 09/23/2021   Fatigue 09/23/2021   Primary osteoarthritis of both knees 04/29/2021   Ankylosing spondylitis (HCC) 12/30/2018   Asthma in adult without complication 12/30/2018   Chronic diarrhea 12/30/2018   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   Interstitial cystitis 07/03/2016   Pelvic pain in female 07/03/2016   Chondromalacia of knee, 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   BMI 38.0-38.9,adult 02/14/2016   Polyarthralgia 02/14/2016   GERD (gastroesophageal reflux disease) 02/10/2014    PCP: Justus Leita DEL, MD  REFERRING PROVIDER: Patti Rosina SAUNDERS, PA-C  REFERRING DIAG: M17.11 (ICD-10-CM) - Unilateral primary osteoarthritis, right knee   RATIONALE FOR EVALUATION AND TREATMENT: Rehabilitation  THERAPY DIAG: Acute postoperative pain of right knee  Stiffness of right knee, not elsewhere classified  Difficulty in walking, not elsewhere classified  Muscle weakness (generalized)  ONSET  DATE: R TKA 02/19/24, Hx L TKA 01/08/24  FOLLOW-UP APPT SCHEDULED WITH REFERRING PROVIDER: Yes ; pt f/u with surgeon 03/06/24   PERTINENT HISTORY: Pt is a 48 year old female known to this clinic with Hx of L TKA 01/08/24 who underwent post-op rehab with good ROM progress. Pt is currently s/p R TKA 02/19/24.   Pt has unfortunately had significant challenges with early post-op phase during both knee replacements. Pt had ED  visits on 6/13 and 6/14 related to nausea and weakness following Sx. Infection, CVD, PE, and acute process in abdomen/pelvis ruled out. Pt had additional hospital admission on 02/27/24 due to bacteremia. She was told infection was possibly due to Foley catheter. Pt is following up with Dr. Justus this Friday to f/u on infection status.   Pt had second culture without significant bacterial growth after initiating antibiotic. Patient reports some ongoing dysuria; pt thinks she may have had hematuria. Patient reports bilateral knee tightness/stiffness. Pt was told by acute care PT she is somewhat behind on ROM. Pt reports intermittent buckling into extension with longer stride. No concerning S&S along post-op incision.    PAIN:   Pain Intensity: Present: 2/10, Best: 2/10, Worst: 6/10 as if week of IE Pain location: Peri-incisional pain Pain quality: aching  Radiating pain: some muscle pain in calf  Swelling: Yes  Numbness/Tingling: Yes; around incision and tibial plateau region  Focal weakness or buckling: Yes; pt buckling into hyperextension Aggravating factors: trying to sleep/lie down, maintaining R knee straight/fully extended Relieving factors: ice, exercise/stretching  24-hour pain behavior: worse in AM, later in evenig  History of prior back, hip, or knee injury, pain, surgery, or therapy: Yes; hx of L TKA 01/08/24, R 02/19/24  Imaging: Yes ; post-op X-ray, swelling noted but no infection ruled in  Prior level of function: Independent Occupational demands: CT tech, X-ray tech; OR and dental imaging; substantial time walking/standing  Hobbies: going to lake - boating; walking dog/walking exercise  Red flags: Negative for personal history of cancer, chills/fever, night sweats, nausea, vomiting, unexplained weight gain/loss, unrelenting pain  PRECAUTIONS: None  WEIGHT BEARING RESTRICTIONS: WBAT  FALLS: Has patient fallen in last 6 months? No  Living Environment Lives with: lives  with their spouse and lives with their daughter 48 y/o daughter) Lives in: House/apartment No steps to get into home; one-level home, handicap accessible, shower seat  Patient Goals: Able to return to work    OBJECTIVE:   Patient Surveys  LEFS = 30/80 = 37.5%    Gross Musculoskeletal Assessment Tremor: None Bulk: Atrophy of R>L quadriceps, moderate Tone: Normal  GAIT: Distance walked: 80 ft Assistive device utilized: Environmental consultant - 2 wheeled Level of assistance: SBA Comments: Moderate dec terminal knee extension with swing phase   AROM AROM (Normal range in degrees) AROM 03/04/24  Hip Right Left  Flexion (125)    Extension (15)    Abduction (40)    Adduction     Internal Rotation (45)    External Rotation (45)        Knee    Flexion (135) 86 110  Extension (0) -4 -2      Ankle    Dorsiflexion (20) 8 8  Plantarflexion (50) WNL WNL  Inversion (35)    Eversion (15    (* = pain; Blank rows = not tested)  LE MMT: MMT (out of 5) Right 03/04/24 Left 03/04/24  Hip flexion 4- 4  Hip extension    Hip abduction (seated) 5 5  Hip adduction (seated)  5 5  Hip internal rotation    Hip external rotation    Knee flexion 4- 4  Knee extension 3+ 4+  Ankle dorsiflexion    Ankle plantarflexion    Ankle inversion    Ankle eversion    (* = pain; Blank rows = not tested)  Sensation Deferred  Reflexes Deferred  Palpation Location LEFT  RIGHT           Quadriceps  1  Medial Hamstrings    Lateral Hamstrings    Lateral Hamstring tendon    Medial Hamstring tendon    Quadriceps tendon    Patella  1  Patellar Tendon  1  Tibial Tuberosity  2  Medial joint line 1 2  Lateral joint line 1 1  MCL    LCL    Adductor Tubercle    Pes Anserine tendon    Infrapatellar fat pad    Fibular head    Popliteal fossa    (Blank rows = not tested) Graded on 0-4 scale (0 = no pain, 1 = pain, 2 = pain with wincing/grimacing/flinching, 3 = pain with withdrawal, 4 = unwilling to allow  palpation), (Blank rows = not tested)  Passive Accessory Motion Deferred   VASCULAR Dorsalis pedis and posterior tibial pulses are palpable No sign of acute DVT Homan's sign: Negative bilat     TODAY'S TREATMENT: 03/27/24   S/p R TKA 02/19/24, L TKA 01/08/24    SUBJECTIVE STATEMENT:   Patient reports 1-2/10 pain at arrival to PT today. She reports painful popping in R knee intermittently when she is flexing/extending knee. She reports improving sleep, but she is sometimes still waking up around 3:45 AM.   AAROM R knee: -5-111 L knee: -2-113   Manual Therapy - for symptom modulation, soft tissue sensitivity and mobility, joint mobility, ROM   Long-leg distraction; 10-sec holds; x 3 min Patellar mobilizations (inferior/superior, medial/lateral) x 5 minutes  R knee PROM with gentle overpressure into flexion/extension; x 3 minutes  Attempted TF distraction in sitting with posterior mobilization; x 30 sec  -discomfort with direct pressure onto proximal tibia   Therapeutic Exercise - for improved soft tissue flexibility and extensibility as needed for ROM, improved strength as needed to improve performance of CKC activities/functional movements   NuStep; Level 4, x 5 minutes; seat at 7 - for improved soft tissue mobility and increase bilat knee ROM (R TKA most acute)  Sit to stand; 2 x 10  *not today* Supine SAQ; 2 x 10, 3 sec hold, blue bolster under R knee, 3-lb ankle weight Seated LAQ, 3-lb ankle weight; 2 x 10 with 3 second hold each LE   Therapeutic Activities - closed-chain movements mimicking functional activities/ADLs  Ambulate laps in hallway; 24 ft; up/down hall x 3  Sit to stand; , from standard chair ; 2 x 10  Forward step-up to 6-inch step; 2 x 10   PATIENT EDUCATION: Discussed current progress with PT.    *not today* TRX squats with chair behind patient 1 x 10, depth to tolerance // bars: Minisquat; 2 x 10 // bars: forward/retro stepping; 4x  D/B  -retro step for terminal knee extension Stairs; 4-step staircase in center of gym; up/down with reciprocal pattern and BUE support; x 3  -pt opts for step-to pattern during final trial of stair descent Total Gym double limb squat, depth to tolerance; x 20, Level 22 Ambulation in gym with SPC x 100' - cues for heel strike - good follow through  PATIENT EDUCATION:  Education details: see above for patient education details Person educated: Patient Education method: Explanation, Demonstration, and Handouts Education comprehension: verbalized understanding and returned demonstration   HOME EXERCISE PROGRAM:  Pt continuing with established home exercises from TKA packet  -pt instructed on propping knee for gravity-assisted extension stretch    ASSESSMENT:  CLINICAL IMPRESSION:   Pt has ongoing improvement with knee ROM. Both knees have functional knee extension. Pt has mild deficit remaining with R knee flexion. Pt exhibits largely normalizing gait pattern and less reliance on AD. Pt needs further work on full ROM and strengthening prior to return to full-time work. Pt will continue to benefit from skilled PT services to address deficits and improve function.   OBJECTIVE IMPAIRMENTS: Abnormal gait, decreased activity tolerance, decreased mobility, difficulty walking, decreased ROM, decreased strength, hypomobility, impaired flexibility, and pain.   ACTIVITY LIMITATIONS: carrying, lifting, sitting, standing, squatting, stairs, transfers, bed mobility, and locomotion level  PARTICIPATION LIMITATIONS: meal prep, cleaning, driving, shopping, community activity, and occupation  PERSONAL FACTORS: Past/current experiences and 3+ comorbidities: (currently being treated for bacteremia, ankylosing spondylitis, polyarthralgia, dyslipidemia) are also affecting patient's functional outcome.   REHAB POTENTIAL: Good  CLINICAL DECISION MAKING: Evolving/moderate complexity  EVALUATION  COMPLEXITY: Moderate   GOALS: Goals reviewed with patient? Yes  SHORT TERM GOALS: Target date: 03/20/2024  Pt will be independent with HEP to improve strength and decrease knee pain to improve pain-free function at home and work. Baseline: 03/04/24: Pt continuing established HEP from hospital/TKA packet; discussed knee extension propped stretch and frequent work on heel slides.  Goal status: INITIAL   LONG TERM GOALS: Target date: 04/24/2024  Pt will have bilateral knee AROM 0-115 deg at least as needed for performance of ADLs and normalized extension for gait pattern Baseline: 03/04/24: Knee AROM R -4-86, L -2-110 Goal status: INITIAL  2.  Pt will decrease worst knee pain by at least 3 points on the NPRS in order to demonstrate clinically significant reduction in knee pain. Baseline: 03/04/24: 6/10 pain at worst.  Goal status: INITIAL  3.  Pt will decrease LEFS score by at least 9 points in order demonstrate clinically significant reduction in knee pain/disability.       Baseline: 03/04/24: 30/80  Goal status: INITIAL  4.  Pt will increase strength of bilateral hip flexors and quads/hamstrings to at least 4+/5 or greater MMT grade in order to demonstrate improvement in strength and function needed for transferring and improved lower limb stability.,  Baseline: 03/04/24: MMT 3+ to 4 for aforementioned muscle groups.  Goal status: INITIAL  5.  Pt will ambulate at community level (660 feet or greater in office) with no AD and symmetrical weightbearing on either LE without major deviations as needed for return to independent mobility and return to work Baseline: 03/04/24: Limited distance tolerated for walking s/p R TKA. Goal status: INITIAL   PLAN: PT FREQUENCY: 1-2x/week  PT DURATION: 6-8 weeks  PLANNED INTERVENTIONS: Therapeutic exercises, Therapeutic activity, Neuromuscular re-education, Balance training, Gait training, Patient/Family education, Self Care, Joint mobilization, DME  instructions, Dry Needling, Electrical stimulation, Cryotherapy, Moist heat, Taping, Manual therapy, and Re-evaluation.  PLAN FOR NEXT SESSION: Knee ROM, quad/HS/hip strengthening, progressive weightbearing and stepping drills to work on weaning toward LRAD and then to no AD.    Venetia Endo, PT, DPT #[id number]  Venetia ONEIDA Endo, PT 03/27/2024, 3:46 PM

## 2024-03-28 ENCOUNTER — Other Ambulatory Visit: Payer: Self-pay | Admitting: Internal Medicine

## 2024-03-28 DIAGNOSIS — R7881 Bacteremia: Secondary | ICD-10-CM

## 2024-03-28 NOTE — Progress Notes (Signed)
 I called patient to repeat labs to see if improved after completing abtx.

## 2024-04-01 ENCOUNTER — Ambulatory Visit: Admitting: Physical Therapy

## 2024-04-01 DIAGNOSIS — M6281 Muscle weakness (generalized): Secondary | ICD-10-CM

## 2024-04-01 DIAGNOSIS — M25661 Stiffness of right knee, not elsewhere classified: Secondary | ICD-10-CM | POA: Diagnosis not present

## 2024-04-01 DIAGNOSIS — G8918 Other acute postprocedural pain: Secondary | ICD-10-CM | POA: Diagnosis not present

## 2024-04-01 DIAGNOSIS — M25561 Pain in right knee: Secondary | ICD-10-CM | POA: Diagnosis not present

## 2024-04-01 DIAGNOSIS — R262 Difficulty in walking, not elsewhere classified: Secondary | ICD-10-CM | POA: Diagnosis not present

## 2024-04-01 NOTE — Therapy (Unsigned)
 OUTPATIENT PHYSICAL THERAPY POST-OP TREATMENT   Patient Name: Hailey [name] MRN: [id number] DOB:Apr 13, 1976, 48 y.o., female Today's Date:04/02/2024   END OF SESSION:  PT End of Session - 04/01/24 1515     Visit Number 8    Number of Visits 17    Date for PT Re-Evaluation 05/01/24    Authorization Type [employer] [id number]    PT Start Time 1515    PT Stop Time 1556    PT Time Calculation (min) 41 min    Activity Tolerance Patient tolerated treatment well    Behavior During Therapy WFL for tasks assessed/performed             Past Medical History:  Diagnosis Date   Allergic state    Allergy    Ankylosing spondylitis (HCC)    followed by rheumatology Dr. Tobie   Arthritis    Asthma    Cervical disc disease    Chest pain    Depression    Enlarged lymph nodes    GERD (gastroesophageal reflux disease)    H/O colonoscopy    HPV (human papilloma virus) infection    Hypothyroidism    Interstitial cystitis    LGSIL (low grade squamous intraepithelial dysplasia)    Paresthesia    Pre-diabetes    Status post laparoscopic cholecystectomy 07/13/2016   Past Surgical History:  Procedure Laterality Date   BREAST BIOPSY Right 10/18/2015   bx/clip- neg   CHOLECYSTECTOMY N/A 07/07/2016   Procedure: LAPAROSCOPIC CHOLECYSTECTOMY;  Surgeon: Aloysius Plant, MD;  Location: [facility] ORS;  Service: General;  Laterality: N/A;   COLONOSCOPY WITH PROPOFOL  N/A 10/11/2015   Procedure: COLONOSCOPY WITH PROPOFOL ;  Surgeon: Gladis RAYMOND Mariner, MD;  Location: [facility] ENDOSCOPY;  Service: Endoscopy;  Laterality: N/A;   ESOPHAGOGASTRODUODENOSCOPY (EGD) WITH PROPOFOL  N/A 10/11/2015   Procedure: ESOPHAGOGASTRODUODENOSCOPY (EGD) WITH PROPOFOL ;  Surgeon: Gladis RAYMOND Mariner, MD;  Location: [facility] ENDOSCOPY;  Service: Endoscopy;  Laterality: N/A;   TONSILLECTOMY     TOTAL KNEE ARTHROPLASTY Left 01/08/2024   Procedure: LEFT TOTAL KNEE ARTHROPLASTYL;  Surgeon: Ernie Cough, MD;  Location: [facility] ORS;  Service: Orthopedics;   Laterality: Left;   TOTAL KNEE ARTHROPLASTY Right 02/19/2024   Procedure: ARTHROPLASTY, KNEE, TOTAL;  Surgeon: Ernie Cough, MD;  Location: [facility] ORS;  Service: Orthopedics;  Laterality: Right;   TOTAL THYROIDECTOMY  2005   goiter   Patient Active Problem List   Diagnosis Date Noted   Bacteremia 02/27/2024   S/P total knee arthroplasty, right 02/19/2024   S/P total knee arthroplasty, left 01/08/2024   S/P total knee arthroplasty 01/08/2024   Atypical chest pain 11/23/2023   Preop cardiovascular exam 11/23/2023   Dyslipidemia 11/23/2023   Generalized lymphadenopathy 09/23/2021   B12 deficiency 09/23/2021   Fatigue 09/23/2021   Primary osteoarthritis of both knees 04/29/2021   Ankylosing spondylitis (HCC) 12/30/2018   Asthma in adult without complication 12/30/2018   Chronic diarrhea 12/30/2018   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   Interstitial cystitis 07/03/2016   Pelvic pain in female 07/03/2016   Chondromalacia of knee, 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   BMI 38.0-38.9,adult 02/14/2016   Polyarthralgia 02/14/2016   GERD (gastroesophageal reflux disease) 02/10/2014    PCP: Justus Leita DEL, MD  REFERRING PROVIDER: Patti Rosina SAUNDERS, PA-C  REFERRING DIAG: M17.11 (ICD-10-CM) - Unilateral primary osteoarthritis, right knee   RATIONALE FOR EVALUATION AND TREATMENT: Rehabilitation  THERAPY DIAG: Acute postoperative pain of right knee  Stiffness of right knee, not elsewhere classified  Difficulty in walking, not elsewhere classified  Muscle weakness (generalized)  ONSET DATE: R TKA 02/19/24, Hx L TKA 01/08/24  FOLLOW-UP APPT SCHEDULED WITH REFERRING PROVIDER: Yes ; pt f/u with surgeon 03/06/24   PERTINENT HISTORY: Pt is a 48 year old female known to this clinic with Hx of L TKA 01/08/24 who underwent post-op rehab with good ROM progress. Pt is currently s/p R TKA 02/19/24.   Pt has unfortunately had significant challenges with early post-op phase during both knee replacements. Pt had ED  visits on 6/13 and 6/14 related to nausea and weakness following Sx. Infection, CVD, PE, and acute process in abdomen/pelvis ruled out. Pt had additional hospital admission on 02/27/24 due to bacteremia. She was told infection was possibly due to Foley catheter. Pt is following up with Dr. Justus this Friday to f/u on infection status.   Pt had second culture without significant bacterial growth after initiating antibiotic. Patient reports some ongoing dysuria; pt thinks she may have had hematuria. Patient reports bilateral knee tightness/stiffness. Pt was told by acute care PT she is somewhat behind on ROM. Pt reports intermittent buckling into extension with longer stride. No concerning S&S along post-op incision.    PAIN:   Pain Intensity: Present: 2/10, Best: 2/10, Worst: 6/10 as if week of IE Pain location: Peri-incisional pain Pain quality: aching  Radiating pain: some muscle pain in calf  Swelling: Yes  Numbness/Tingling: Yes; around incision and tibial plateau region  Focal weakness or buckling: Yes; pt buckling into hyperextension Aggravating factors: trying to sleep/lie down, maintaining R knee straight/fully extended Relieving factors: ice, exercise/stretching  24-hour pain behavior: worse in AM, later in evenig  History of prior back, hip, or knee injury, pain, surgery, or therapy: Yes; hx of L TKA 01/08/24, R 02/19/24  Imaging: Yes ; post-op X-ray, swelling noted but no infection ruled in  Prior level of function: Independent Occupational demands: CT tech, X-ray tech; OR and dental imaging; substantial time walking/standing  Hobbies: going to lake - boating; walking dog/walking exercise  Red flags: Negative for personal history of cancer, chills/fever, night sweats, nausea, vomiting, unexplained weight gain/loss, unrelenting pain  PRECAUTIONS: None  WEIGHT BEARING RESTRICTIONS: WBAT  FALLS: Has patient fallen in last 6 months? No  Living Environment Lives with: lives  with their spouse and lives with their daughter 48 y/o daughter) Lives in: House/apartment No steps to get into home; one-level home, handicap accessible, shower seat  Patient Goals: Able to return to work      OBJECTIVE (data from initial evaluation unless otherwise dated):   Patient Surveys  LEFS = 30/80 = 37.5%    Gross Musculoskeletal Assessment Tremor: None Bulk: Atrophy of R>L quadriceps, moderate Tone: Normal  GAIT: Distance walked: 80 ft Assistive device utilized: Environmental consultant - 2 wheeled Level of assistance: SBA Comments: Moderate dec terminal knee extension with swing phase   AROM AROM (Normal range in degrees) AROM 03/04/24  Hip Right Left  Flexion (125)    Extension (15)    Abduction (40)    Adduction     Internal Rotation (45)    External Rotation (45)        Knee    Flexion (135) 86 110  Extension (0) -4 -2      Ankle    Dorsiflexion (20) 8 8  Plantarflexion (50) WNL WNL  Inversion (35)    Eversion (15    (* = pain; Blank rows = not tested)  LE MMT: MMT (out of 5) Right 03/04/24 Left 03/04/24  Hip flexion 4- 4  Hip extension  Hip abduction (seated) 5 5  Hip adduction (seated) 5 5  Hip internal rotation    Hip external rotation    Knee flexion 4- 4  Knee extension 3+ 4+  Ankle dorsiflexion    Ankle plantarflexion    Ankle inversion    Ankle eversion    (* = pain; Blank rows = not tested)  Sensation Deferred  Reflexes Deferred  Palpation Location LEFT  RIGHT           Quadriceps  1  Medial Hamstrings    Lateral Hamstrings    Lateral Hamstring tendon    Medial Hamstring tendon    Quadriceps tendon    Patella  1  Patellar Tendon  1  Tibial Tuberosity  2  Medial joint line 1 2  Lateral joint line 1 1  MCL    LCL    Adductor Tubercle    Pes Anserine tendon    Infrapatellar fat pad    Fibular head    Popliteal fossa    (Blank rows = not tested) Graded on 0-4 scale (0 = no pain, 1 = pain, 2 = pain with  wincing/grimacing/flinching, 3 = pain with withdrawal, 4 = unwilling to allow palpation), (Blank rows = not tested)  Passive Accessory Motion Deferred   VASCULAR Dorsalis pedis and posterior tibial pulses are palpable No sign of acute DVT Homan's sign: Negative bilat     TODAY'S TREATMENT: 04/02/24   S/p R TKA 02/19/24, L TKA 01/08/24    SUBJECTIVE STATEMENT:   Patient reports feeling that she's turned a corner with her R TKA. Patient reports 1/10 pain at arrival to PT. Patient is tentatively returning to half days of work (6 hours, full is 12-hour day) on 04/08/24. Patient reports going to softball games this past weekend with her [facility] and doing relatively. well with this.   AAROM R knee: -5-111 L knee: -2-113   Therapeutic Exercise - for improved soft tissue flexibility and extensibility as needed for ROM, improved strength as needed to improve performance of CKC activities/functional movements   NuStep; Level 4, x 5 minutes; seat at 7 - for improved soft tissue mobility and increase bilat knee ROM (R TKA most acute)  Seated LAQ, 5-lb ankle weight; 2 x 10 with 3 second hold each LE  Sit to stand; 2 x 10 with 6-lb Medball hold, treatment table at lowest setting   *not today* Supine SAQ; 2 x 10, 3 sec hold, blue bolster under R knee, 3-lb ankle weight     Manual Therapy - for symptom modulation, soft tissue sensitivity and mobility, joint mobility, ROM   Patellar mobilizations (inferior/superior, medial/lateral) x 3 minutes  Scar massage; x 1 min bouts each along proximal and distal scar  STM R distal rectus femoris and VMO; x 5 minutes  -notable hypersensitivity along distal substance of RF/VMO  R knee PROM with gentle overpressure into flexion/extension; x 5 minutes      Therapeutic Activities - closed-chain movements mimicking functional activities/ADLs  Ambulate laps in gym; x 3  -mild L trunk lean with L stance phase, normal heel strike and step/stride  length  Forward step-up to 6-inch step; 2 x 10  Forward minilunge in // bars; x 15 alt R/L   PATIENT EDUCATION: Updated HEP to include further CKC strengthening drills to prep for full return to work/activity   *not today* TRX squats with chair behind patient 1 x 10, depth to tolerance // bars: Minisquat; 2 x 10 // bars: forward/retro  stepping; 4x D/B  -retro step for terminal knee extension Stairs; 4-step staircase in center of gym; up/down with reciprocal pattern and BUE support; x 3  -pt opts for step-to pattern during final trial of stair descent Total Gym double limb squat, depth to tolerance; x 20, Level 22 Ambulation in gym with SPC x 100' - cues for heel strike - good follow through    PATIENT EDUCATION:  Education details: see above for patient education details Person educated: Patient Education method: Explanation, Demonstration, and Handouts Education comprehension: verbalized understanding and returned demonstration   HOME EXERCISE PROGRAM:  Pt continuing with established home exercises from TKA packet  -pt instructed on propping knee for gravity-assisted extension stretch  Access Code: [email or web address] URL: [email or web address] Date: 04/01/2024 Prepared by: Venetia Endo  Exercises - Sit to Stand  - 1 x daily - 5 x weekly - 2 sets - 10 reps - Standing Double Leg Mini Squat  - 1 x daily - 5 x weekly - 2 sets - 10 reps - Forward Step Up with Counter Support  - 1 x daily - 5 x weekly - 2 sets - 10 reps  ASSESSMENT:  CLINICAL IMPRESSION:   Patient is tentatively returning to work on 04/08/24. She exhibits markedly improved gait pattern and LE strength. She has well healing L TKA incision and normalizing ROM. Pt only has mild flexion ROM deficit with R being modestly more limited due to recency of surgery. We are continuing work on restoration of end-ROM and recovery of CKC movements required during transfers and ADLs. Pt needs further work on full ROM  and strengthening prior to return to full-time work. Pt will continue to benefit from skilled PT services to address deficits and improve function.   OBJECTIVE IMPAIRMENTS: Abnormal gait, decreased activity tolerance, decreased mobility, difficulty walking, decreased ROM, decreased strength, hypomobility, impaired flexibility, and pain.   ACTIVITY LIMITATIONS: carrying, lifting, sitting, standing, squatting, stairs, transfers, bed mobility, and locomotion level  PARTICIPATION LIMITATIONS: meal prep, cleaning, driving, shopping, community activity, and occupation  PERSONAL FACTORS: Past/current experiences and 3+ comorbidities: (currently being treated for bacteremia, ankylosing spondylitis, polyarthralgia, dyslipidemia) are also affecting patient's functional outcome.   REHAB POTENTIAL: Good  CLINICAL DECISION MAKING: Evolving/moderate complexity  EVALUATION COMPLEXITY: Moderate   GOALS: Goals reviewed with patient? Yes  SHORT TERM GOALS: Target date: 03/20/2024  Pt will be independent with HEP to improve strength and decrease knee pain to improve pain-free function at home and work. Baseline: 03/04/24: Pt continuing established HEP from hospital/TKA packet; discussed knee extension propped stretch and frequent work on heel slides.  Goal status: INITIAL   LONG TERM GOALS: Target date: 04/24/2024  Pt will have bilateral knee AROM 0-115 deg at least as needed for performance of ADLs and normalized extension for gait pattern Baseline: 03/04/24: Knee AROM R -4-86, L -2-110 Goal status: INITIAL  2.  Pt will decrease worst knee pain by at least 3 points on the NPRS in order to demonstrate clinically significant reduction in knee pain. Baseline: 03/04/24: 6/10 pain at worst.  Goal status: INITIAL  3.  Pt will decrease LEFS score by at least 9 points in order demonstrate clinically significant reduction in knee pain/disability.       Baseline: 03/04/24: 30/80  Goal status: INITIAL  4.  Pt  will increase strength of bilateral hip flexors and quads/hamstrings to at least 4+/5 or greater MMT grade in order to demonstrate improvement in strength and function needed for transferring and improved lower limb stability.,  Baseline: 03/04/24: MMT 3+ to 4 for aforementioned muscle groups.  Goal status: INITIAL  5.  Pt will ambulate at community level (660 feet or greater in office) with no AD and symmetrical weightbearing on either LE without major deviations as needed for return to independent mobility and return to work Baseline: 03/04/24: Limited distance tolerated for walking s/p R TKA. Goal status: INITIAL   PLAN: PT FREQUENCY: 1-2x/week  PT DURATION: 6-8 weeks  PLANNED INTERVENTIONS: Therapeutic exercises, Therapeutic activity, Neuromuscular re-education, Balance training, Gait training, Patient/Family education, Self Care, Joint mobilization, DME instructions, Dry Needling, Electrical stimulation, Cryotherapy, Moist heat, Taping, Manual therapy, and Re-evaluation.  PLAN FOR NEXT SESSION: Knee ROM, quad/HS/hip strengthening, progressive weightbearing and stepping drills to work on weaning toward LRAD and then to no AD.    Venetia Endo, PT, DPT #[id number]  Venetia ONEIDA Endo, PT 04/02/2024, 4:01 PM

## 2024-04-02 ENCOUNTER — Ambulatory Visit: Admitting: Urology

## 2024-04-02 ENCOUNTER — Encounter: Payer: Self-pay | Admitting: Physical Therapy

## 2024-04-03 ENCOUNTER — Ambulatory Visit: Admitting: Physical Therapy

## 2024-04-03 DIAGNOSIS — M25561 Pain in right knee: Secondary | ICD-10-CM

## 2024-04-03 DIAGNOSIS — R262 Difficulty in walking, not elsewhere classified: Secondary | ICD-10-CM | POA: Diagnosis not present

## 2024-04-03 DIAGNOSIS — M6281 Muscle weakness (generalized): Secondary | ICD-10-CM

## 2024-04-03 DIAGNOSIS — M25661 Stiffness of right knee, not elsewhere classified: Secondary | ICD-10-CM

## 2024-04-03 DIAGNOSIS — G8918 Other acute postprocedural pain: Secondary | ICD-10-CM | POA: Diagnosis not present

## 2024-04-03 NOTE — Therapy (Unsigned)
 OUTPATIENT PHYSICAL THERAPY POST-OP TREATMENT   Patient Name: Hailey [name] MRN: [id number] DOB:06-07-1976, 48 y.o., female Today's Date:04/03/2024   END OF SESSION:  PT End of Session - 04/03/24 1656     Visit Number 9    Number of Visits 17    Date for PT Re-Evaluation 05/01/24    Authorization Type [employer] [id number]    PT Start Time 1654    PT Stop Time 1732    PT Time Calculation (min) 38 min    Activity Tolerance Patient tolerated treatment well    Behavior During Therapy WFL for tasks assessed/performed              Past Medical History:  Diagnosis Date   Allergic state    Allergy    Ankylosing spondylitis (HCC)    followed by rheumatology Dr. Tobie   Arthritis    Asthma    Cervical disc disease    Chest pain    Depression    Enlarged lymph nodes    GERD (gastroesophageal reflux disease)    H/O colonoscopy    HPV (human papilloma virus) infection    Hypothyroidism    Interstitial cystitis    LGSIL (low grade squamous intraepithelial dysplasia)    Paresthesia    Pre-diabetes    Status post laparoscopic cholecystectomy 07/13/2016   Past Surgical History:  Procedure Laterality Date   BREAST BIOPSY Right 10/18/2015   bx/clip- neg   CHOLECYSTECTOMY N/A 07/07/2016   Procedure: LAPAROSCOPIC CHOLECYSTECTOMY;  Surgeon: Aloysius Plant, MD;  Location: [facility] ORS;  Service: General;  Laterality: N/A;   COLONOSCOPY WITH PROPOFOL  N/A 10/11/2015   Procedure: COLONOSCOPY WITH PROPOFOL ;  Surgeon: Gladis RAYMOND Mariner, MD;  Location: [facility] ENDOSCOPY;  Service: Endoscopy;  Laterality: N/A;   ESOPHAGOGASTRODUODENOSCOPY (EGD) WITH PROPOFOL  N/A 10/11/2015   Procedure: ESOPHAGOGASTRODUODENOSCOPY (EGD) WITH PROPOFOL ;  Surgeon: Gladis RAYMOND Mariner, MD;  Location: [facility] ENDOSCOPY;  Service: Endoscopy;  Laterality: N/A;   TONSILLECTOMY     TOTAL KNEE ARTHROPLASTY Left 01/08/2024   Procedure: LEFT TOTAL KNEE ARTHROPLASTYL;  Surgeon: Ernie Cough, MD;  Location: [facility] ORS;  Service: Orthopedics;   Laterality: Left;   TOTAL KNEE ARTHROPLASTY Right 02/19/2024   Procedure: ARTHROPLASTY, KNEE, TOTAL;  Surgeon: Ernie Cough, MD;  Location: [facility] ORS;  Service: Orthopedics;  Laterality: Right;   TOTAL THYROIDECTOMY  2005   goiter   Patient Active Problem List   Diagnosis Date Noted   Bacteremia 02/27/2024   S/P total knee arthroplasty, right 02/19/2024   S/P total knee arthroplasty, left 01/08/2024   S/P total knee arthroplasty 01/08/2024   Atypical chest pain 11/23/2023   Preop cardiovascular exam 11/23/2023   Dyslipidemia 11/23/2023   Generalized lymphadenopathy 09/23/2021   B12 deficiency 09/23/2021   Fatigue 09/23/2021   Primary osteoarthritis of both knees 04/29/2021   Ankylosing spondylitis (HCC) 12/30/2018   Asthma in adult without complication 12/30/2018   Chronic diarrhea 12/30/2018   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   Interstitial cystitis 07/03/2016   Pelvic pain in female 07/03/2016   Chondromalacia of knee, 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   BMI 38.0-38.9,adult 02/14/2016   Polyarthralgia 02/14/2016   GERD (gastroesophageal reflux disease) 02/10/2014    PCP: Justus Leita DEL, MD  REFERRING PROVIDER: Patti Rosina SAUNDERS, PA-C  REFERRING DIAG: M17.11 (ICD-10-CM) - Unilateral primary osteoarthritis, right knee   RATIONALE FOR EVALUATION AND TREATMENT: Rehabilitation  THERAPY DIAG: Acute postoperative pain of right knee  Stiffness of right knee, not elsewhere classified  Difficulty in walking, not elsewhere classified  Muscle weakness (generalized)  ONSET DATE: R TKA 02/19/24, Hx L TKA 01/08/24  FOLLOW-UP APPT SCHEDULED WITH REFERRING PROVIDER: Yes ; pt f/u with surgeon 03/06/24   PERTINENT HISTORY: Pt is a 48 year old female known to this clinic with Hx of L TKA 01/08/24 who underwent post-op rehab with good ROM progress. Pt is currently s/p R TKA 02/19/24.   Pt has unfortunately had significant challenges with early post-op phase during both knee replacements. Pt had ED  visits on 6/13 and 6/14 related to nausea and weakness following Sx. Infection, CVD, PE, and acute process in abdomen/pelvis ruled out. Pt had additional hospital admission on 02/27/24 due to bacteremia. She was told infection was possibly due to Foley catheter. Pt is following up with Dr. Justus this Friday to f/u on infection status.   Pt had second culture without significant bacterial growth after initiating antibiotic. Patient reports some ongoing dysuria; pt thinks she may have had hematuria. Patient reports bilateral knee tightness/stiffness. Pt was told by acute care PT she is somewhat behind on ROM. Pt reports intermittent buckling into extension with longer stride. No concerning S&S along post-op incision.    PAIN:   Pain Intensity: Present: 2/10, Best: 2/10, Worst: 6/10 as if week of IE Pain location: Peri-incisional pain Pain quality: aching  Radiating pain: some muscle pain in calf  Swelling: Yes  Numbness/Tingling: Yes; around incision and tibial plateau region  Focal weakness or buckling: Yes; pt buckling into hyperextension Aggravating factors: trying to sleep/lie down, maintaining R knee straight/fully extended Relieving factors: ice, exercise/stretching  24-hour pain behavior: worse in AM, later in evenig  History of prior back, hip, or knee injury, pain, surgery, or therapy: Yes; hx of L TKA 01/08/24, R 02/19/24  Imaging: Yes ; post-op X-ray, swelling noted but no infection ruled in  Prior level of function: Independent Occupational demands: CT tech, X-ray tech; OR and dental imaging; substantial time walking/standing  Hobbies: going to lake - boating; walking dog/walking exercise  Red flags: Negative for personal history of cancer, chills/fever, night sweats, nausea, vomiting, unexplained weight gain/loss, unrelenting pain  PRECAUTIONS: None  WEIGHT BEARING RESTRICTIONS: WBAT  FALLS: Has patient fallen in last 6 months? No  Living Environment Lives with: lives  with their spouse and lives with their daughter 758 y/o daughter) Lives in: House/apartment No steps to get into home; one-level home, handicap accessible, shower seat  Patient Goals: Able to return to work      OBJECTIVE (data from initial evaluation unless otherwise dated):   Patient Surveys  LEFS = 30/80 = 37.5%    Gross Musculoskeletal Assessment Tremor: None Bulk: Atrophy of R>L quadriceps, moderate Tone: Normal  GAIT: Distance walked: 80 ft Assistive device utilized: Environmental consultant - 2 wheeled Level of assistance: SBA Comments: Moderate dec terminal knee extension with swing phase   AROM AROM (Normal range in degrees) AROM 03/04/24  Hip Right Left  Flexion (125)    Extension (15)    Abduction (40)    Adduction     Internal Rotation (45)    External Rotation (45)        Knee    Flexion (135) 86 110  Extension (0) -4 -2      Ankle    Dorsiflexion (20) 8 8  Plantarflexion (50) WNL WNL  Inversion (35)    Eversion (15    (* = pain; Blank rows = not tested)  LE MMT: MMT (out of 5) Right 03/04/24 Left 03/04/24  Hip flexion 4- 4  Hip extension  Hip abduction (seated) 5 5  Hip adduction (seated) 5 5  Hip internal rotation    Hip external rotation    Knee flexion 4- 4  Knee extension 3+ 4+  Ankle dorsiflexion    Ankle plantarflexion    Ankle inversion    Ankle eversion    (* = pain; Blank rows = not tested)  Sensation Deferred  Reflexes Deferred  Palpation Location LEFT  RIGHT           Quadriceps  1  Medial Hamstrings    Lateral Hamstrings    Lateral Hamstring tendon    Medial Hamstring tendon    Quadriceps tendon    Patella  1  Patellar Tendon  1  Tibial Tuberosity  2  Medial joint line 1 2  Lateral joint line 1 1  MCL    LCL    Adductor Tubercle    Pes Anserine tendon    Infrapatellar fat pad    Fibular head    Popliteal fossa    (Blank rows = not tested) Graded on 0-4 scale (0 = no pain, 1 = pain, 2 = pain with  wincing/grimacing/flinching, 3 = pain with withdrawal, 4 = unwilling to allow palpation), (Blank rows = not tested)  Passive Accessory Motion Deferred   VASCULAR Dorsalis pedis and posterior tibial pulses are palpable No sign of acute DVT Homan's sign: Negative bilat     TODAY'S TREATMENT: 04/03/24   S/p R TKA 02/19/24, L TKA 01/08/24    SUBJECTIVE STATEMENT:   Patient reports feeling that she's turned a corner with her R TKA. Patient reports 1/10 pain at arrival to PT. Patient is tentatively returning to half days of work (6 hours, full is 12-hour day) on 04/08/24. Patient reports going to softball games this past weekend with her [facility] and doing relatively. well with this.   AAROM R knee: -5-111 L knee: -2-113   Therapeutic Exercise - for improved soft tissue flexibility and extensibility as needed for ROM, improved strength as needed to improve performance of CKC activities/functional movements   NuStep; Level 4, x 5 minutes; seat at 7 - for improved soft tissue mobility and increase bilat knee ROM (R TKA most acute)  Seated LAQ, 5-lb ankle weight; 2 x 10 with 3 second hold each LE  Sit to stand; 2 x 10 with 6-lb Medball hold, treatment table at lowest setting   *not today* Supine SAQ; 2 x 10, 3 sec hold, blue bolster under R knee, 3-lb ankle weight     Manual Therapy - for symptom modulation, soft tissue sensitivity and mobility, joint mobility, ROM   Patellar mobilizations (inferior/superior, medial/lateral) x 3 minutes  Scar massage; x 1 min bouts each along proximal and distal scar  STM R distal rectus femoris and VMO; x 5 minutes  -notable hypersensitivity along distal substance of RF/VMO  R knee PROM with gentle overpressure into flexion/extension; x 5 minutes      Therapeutic Activities - closed-chain movements mimicking functional activities/ADLs  Ambulate laps in gym; x 3  -mild L trunk lean with L stance phase, normal heel strike and step/stride  length  Forward step-up to 6-inch step; 2 x 10  Forward minilunge in // bars; x 15 alt R/L   PATIENT EDUCATION: Updated HEP to include further CKC strengthening drills to prep for full return to work/activity   *not today* TRX squats with chair behind patient 1 x 10, depth to tolerance // bars: Minisquat; 2 x 10 // bars: forward/retro  stepping; 4x D/B  -retro step for terminal knee extension Stairs; 4-step staircase in center of gym; up/down with reciprocal pattern and BUE support; x 3  -pt opts for step-to pattern during final trial of stair descent Total Gym double limb squat, depth to tolerance; x 20, Level 22 Ambulation in gym with SPC x 100' - cues for heel strike - good follow through    PATIENT EDUCATION:  Education details: see above for patient education details Person educated: Patient Education method: Explanation, Demonstration, and Handouts Education comprehension: verbalized understanding and returned demonstration   HOME EXERCISE PROGRAM:  Pt continuing with established home exercises from TKA packet  -pt instructed on propping knee for gravity-assisted extension stretch  Access Code: [email or web address] URL: [email or web address] Date: 04/01/2024 Prepared by: Venetia Endo  Exercises - Sit to Stand  - 1 x daily - 5 x weekly - 2 sets - 10 reps - Standing Double Leg Mini Squat  - 1 x daily - 5 x weekly - 2 sets - 10 reps - Forward Step Up with Counter Support  - 1 x daily - 5 x weekly - 2 sets - 10 reps  ASSESSMENT:  CLINICAL IMPRESSION:   Patient is tentatively returning to work on 04/08/24. She exhibits markedly improved gait pattern and LE strength. She has well healing L TKA incision and normalizing ROM. Pt only has mild flexion ROM deficit with R being modestly more limited due to recency of surgery. We are continuing work on restoration of end-ROM and recovery of CKC movements required during transfers and ADLs. Pt needs further work on full ROM  and strengthening prior to return to full-time work. Pt will continue to benefit from skilled PT services to address deficits and improve function.   OBJECTIVE IMPAIRMENTS: Abnormal gait, decreased activity tolerance, decreased mobility, difficulty walking, decreased ROM, decreased strength, hypomobility, impaired flexibility, and pain.   ACTIVITY LIMITATIONS: carrying, lifting, sitting, standing, squatting, stairs, transfers, bed mobility, and locomotion level  PARTICIPATION LIMITATIONS: meal prep, cleaning, driving, shopping, community activity, and occupation  PERSONAL FACTORS: Past/current experiences and 3+ comorbidities: (currently being treated for bacteremia, ankylosing spondylitis, polyarthralgia, dyslipidemia) are also affecting patient's functional outcome.   REHAB POTENTIAL: Good  CLINICAL DECISION MAKING: Evolving/moderate complexity  EVALUATION COMPLEXITY: Moderate   GOALS: Goals reviewed with patient? Yes  SHORT TERM GOALS: Target date: 03/20/2024  Pt will be independent with HEP to improve strength and decrease knee pain to improve pain-free function at home and work. Baseline: 03/04/24: Pt continuing established HEP from hospital/TKA packet; discussed knee extension propped stretch and frequent work on heel slides.  Goal status: INITIAL   LONG TERM GOALS: Target date: 04/24/2024  Pt will have bilateral knee AROM 0-115 deg at least as needed for performance of ADLs and normalized extension for gait pattern Baseline: 03/04/24: Knee AROM R -4-86, L -2-110 Goal status: INITIAL  2.  Pt will decrease worst knee pain by at least 3 points on the NPRS in order to demonstrate clinically significant reduction in knee pain. Baseline: 03/04/24: 6/10 pain at worst.  Goal status: INITIAL  3.  Pt will decrease LEFS score by at least 9 points in order demonstrate clinically significant reduction in knee pain/disability.       Baseline: 03/04/24: 30/80  Goal status: INITIAL  4.  Pt  will increase strength of bilateral hip flexors and quads/hamstrings to at least 4+/5 or greater MMT grade in order to demonstrate improvement in strength and function needed for transferring and improved lower limb stability.,  Baseline: 03/04/24: MMT 3+ to 4 for aforementioned muscle groups.  Goal status: INITIAL  5.  Pt will ambulate at community level (660 feet or greater in office) with no AD and symmetrical weightbearing on either LE without major deviations as needed for return to independent mobility and return to work Baseline: 03/04/24: Limited distance tolerated for walking s/p R TKA. Goal status: INITIAL   PLAN: PT FREQUENCY: 1-2x/week  PT DURATION: 6-8 weeks  PLANNED INTERVENTIONS: Therapeutic exercises, Therapeutic activity, Neuromuscular re-education, Balance training, Gait training, Patient/Family education, Self Care, Joint mobilization, DME instructions, Dry Needling, Electrical stimulation, Cryotherapy, Moist heat, Taping, Manual therapy, and Re-evaluation.  PLAN FOR NEXT SESSION: Knee ROM, quad/HS/hip strengthening, progressive weightbearing and stepping drills to work on weaning toward LRAD and then to no AD.    Venetia Endo, PT, DPT #[id number]  Venetia ONEIDA Endo, PT 04/03/2024, 4:56 PM

## 2024-04-06 NOTE — Therapy (Deleted)
 OUTPATIENT PHYSICAL THERAPY TREATMENT AND PROGRESS NOTE   Dates of reporting period  03/04/24   to   04/07/24   Patient Name: Hailey [name] MRN: [id number] DOB:[birth year], 48 y.o., female Today's Date:04/06/2024   END OF SESSION:        Past Medical History:  Diagnosis Date   Allergic state    Allergy    Ankylosing spondylitis (HCC)    followed by rheumatology Dr. Tobie   Arthritis    Asthma    Cervical disc disease    Chest pain    Depression    Enlarged lymph nodes    GERD (gastroesophageal reflux disease)    H/O colonoscopy    HPV (human papilloma virus) infection    Hypothyroidism    Interstitial cystitis    LGSIL (low grade squamous intraepithelial dysplasia)    Paresthesia    Pre-diabetes    Status post laparoscopic cholecystectomy 07/13/2016   Past Surgical History:  Procedure Laterality Date   BREAST BIOPSY Right 10/18/2015   bx/clip- neg   CHOLECYSTECTOMY N/A 07/07/2016   Procedure: LAPAROSCOPIC CHOLECYSTECTOMY;  Surgeon: Aloysius Plant, MD;  Location: [facility] ORS;  Service: General;  Laterality: N/A;   COLONOSCOPY WITH PROPOFOL  N/A 10/11/2015   Procedure: COLONOSCOPY WITH PROPOFOL ;  Surgeon: Gladis RAYMOND Mariner, MD;  Location: [facility] ENDOSCOPY;  Service: Endoscopy;  Laterality: N/A;   ESOPHAGOGASTRODUODENOSCOPY (EGD) WITH PROPOFOL  N/A 10/11/2015   Procedure: ESOPHAGOGASTRODUODENOSCOPY (EGD) WITH PROPOFOL ;  Surgeon: Gladis RAYMOND Mariner, MD;  Location: [facility] ENDOSCOPY;  Service: Endoscopy;  Laterality: N/A;   TONSILLECTOMY     TOTAL KNEE ARTHROPLASTY Left 01/08/2024   Procedure: LEFT TOTAL KNEE ARTHROPLASTYL;  Surgeon: Ernie Cough, MD;  Location: [facility] ORS;  Service: Orthopedics;  Laterality: Left;   TOTAL KNEE ARTHROPLASTY Right 02/19/2024   Procedure: ARTHROPLASTY, KNEE, TOTAL;  Surgeon: Ernie Cough, MD;  Location: [facility] ORS;  Service: Orthopedics;  Laterality: Right;   TOTAL THYROIDECTOMY  2005   goiter   Patient Active Problem List   Diagnosis Date Noted    Bacteremia 02/27/2024   S/P total knee arthroplasty, right 02/19/2024   S/P total knee arthroplasty, left 01/08/2024   S/P total knee arthroplasty 01/08/2024   Atypical chest pain 11/23/2023   Preop cardiovascular exam 11/23/2023   Dyslipidemia 11/23/2023   Generalized lymphadenopathy 09/23/2021   B12 deficiency 09/23/2021   Fatigue 09/23/2021   Primary osteoarthritis of both knees 04/29/2021   Ankylosing spondylitis (HCC) 12/30/2018   Asthma in adult without complication 12/30/2018   Chronic diarrhea 12/30/2018   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   Interstitial cystitis 07/03/2016   Pelvic pain in female 07/03/2016   Chondromalacia of knee, 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   BMI 38.0-38.9,adult 02/14/2016   Polyarthralgia 02/14/2016   GERD (gastroesophageal reflux disease) 02/10/2014    PCP: Justus Leita DEL, MD  REFERRING PROVIDER: Patti Rosina SAUNDERS, PA-C  REFERRING DIAG: M17.11 (ICD-10-CM) - Unilateral primary osteoarthritis, right knee   RATIONALE FOR EVALUATION AND TREATMENT: Rehabilitation  THERAPY DIAG: Acute postoperative pain of right knee  Stiffness of right knee, not elsewhere classified  Difficulty in walking, not elsewhere classified  Muscle weakness (generalized)  ONSET DATE: R TKA 02/19/24, Hx L TKA 01/08/24  FOLLOW-UP APPT SCHEDULED WITH REFERRING PROVIDER: Yes ; pt f/u with surgeon 03/06/24   PERTINENT HISTORY: Pt is a 48 year old female known to this clinic with Hx of L TKA 01/08/24 who underwent post-op rehab with good ROM progress. Pt is currently s/p R TKA 02/19/24.   Pt has unfortunately had significant challenges with early post-op  phase during both knee replacements. Pt had ED visits on 6/13 and 6/14 related to nausea and weakness following Sx. Infection, CVD, PE, and acute process in abdomen/pelvis ruled out. Pt had additional hospital admission on 02/27/24 due to bacteremia. She was told infection was possibly due to Foley catheter. Pt is following up with Dr.  Justus this Friday to f/u on infection status.   Pt had second culture without significant bacterial growth after initiating antibiotic. Patient reports some ongoing dysuria; pt thinks she may have had hematuria. Patient reports bilateral knee tightness/stiffness. Pt was told by acute care PT she is somewhat behind on ROM. Pt reports intermittent buckling into extension with longer stride. No concerning S&S along post-op incision.    PAIN:   Pain Intensity: Present: 2/10, Best: 2/10, Worst: 6/10 as if week of IE Pain location: Peri-incisional pain Pain quality: aching  Radiating pain: some muscle pain in calf  Swelling: Yes  Numbness/Tingling: Yes; around incision and tibial plateau region  Focal weakness or buckling: Yes; pt buckling into hyperextension Aggravating factors: trying to sleep/lie down, maintaining R knee straight/fully extended Relieving factors: ice, exercise/stretching  24-hour pain behavior: worse in AM, later in evenig  History of prior back, hip, or knee injury, pain, surgery, or therapy: Yes; hx of L TKA 01/08/24, R 02/19/24  Imaging: Yes ; post-op X-ray, swelling noted but no infection ruled in  Prior level of function: Independent Occupational demands: CT tech, X-ray tech; OR and dental imaging; substantial time walking/standing  Hobbies: going to lake - boating; walking dog/walking exercise  Red flags: Negative for personal history of cancer, chills/fever, night sweats, nausea, vomiting, unexplained weight gain/loss, unrelenting pain  PRECAUTIONS: None  WEIGHT BEARING RESTRICTIONS: WBAT  FALLS: Has patient fallen in last 6 months? No  Living Environment Lives with: lives with their spouse and lives with their daughter 48 y/o daughter) Lives in: House/apartment No steps to get into home; one-level home, handicap accessible, shower seat  Patient Goals: Able to return to work      OBJECTIVE (data from initial evaluation unless otherwise dated):    Patient Surveys  LEFS = 30/80 = 37.5%    Gross Musculoskeletal Assessment Tremor: None Bulk: Atrophy of R>L quadriceps, moderate Tone: Normal  GAIT: Distance walked: 80 ft Assistive device utilized: Environmental consultant - 2 wheeled Level of assistance: SBA Comments: Moderate dec terminal knee extension with swing phase   AROM AROM (Normal range in degrees) AROM 03/04/24 AROM 04/07/24  Hip Right Left Right Left  Flexion (125)      Extension (15)      Abduction (40)      Adduction       Internal Rotation (45)      External Rotation (45)            Knee      Flexion (135) 86 110    Extension (0) -4 -2          Ankle      Dorsiflexion (20) 8 8    Plantarflexion (50) WNL WNL    Inversion (35)      Eversion (15      (* = pain; Blank rows = not tested)  LE MMT: MMT (out of 5) Right 03/04/24 Left 03/04/24 Right 04/07/24 Left 04/07/24  Hip flexion 4- 4    Hip extension      Hip abduction (seated) 5 5    Hip adduction (seated) 5 5    Hip internal rotation  Hip external rotation      Knee flexion 4- 4    Knee extension 3+ 4+    Ankle dorsiflexion      Ankle plantarflexion      Ankle inversion      Ankle eversion      (* = pain; Blank rows = not tested)  Sensation Deferred  Reflexes Deferred  Palpation Location LEFT  RIGHT           Quadriceps  1  Medial Hamstrings    Lateral Hamstrings    Lateral Hamstring tendon    Medial Hamstring tendon    Quadriceps tendon    Patella  1  Patellar Tendon  1  Tibial Tuberosity  2  Medial joint line 1 2  Lateral joint line 1 1  MCL    LCL    Adductor Tubercle    Pes Anserine tendon    Infrapatellar fat pad    Fibular head    Popliteal fossa    (Blank rows = not tested) Graded on 0-4 scale (0 = no pain, 1 = pain, 2 = pain with wincing/grimacing/flinching, 3 = pain with withdrawal, 4 = unwilling to allow palpation), (Blank rows = not tested)  Passive Accessory Motion Deferred   VASCULAR Dorsalis pedis and posterior  tibial pulses are palpable No sign of acute DVT Homan's sign: Negative bilat     TODAY'S TREATMENT: 04/06/24   S/p R TKA 02/19/24, L TKA 01/08/24    SUBJECTIVE STATEMENT:   *** Patient reports doing well with gait with lessening use of SPC. She reports ongoing discomfort at night and disturbed sleep. She reports 1/10 pain at this time. She reports minimal L knee pain, intermittent mild to severe R knee pain.    *GOAL UPDATE PERFORMED   Therapeutic Activities - closed-chain movements mimicking functional activities/ADLs  Ambulate laps in gym; x 3  -mild L trunk lean with L stance phase, normal heel strike and step/stride length  TRX squats; 2 x 10, depth to tolerance  Forward dynamic minilunge in // bars; x 15 alt R/L   PATIENT EDUCATION: Updated HEP to include further CKC strengthening drills to prep for full return to work/activity   *not today* // bars: Minisquat; 2 x 10 // bars: forward/retro stepping; 4x D/B  -retro step for terminal knee extension Stairs; 4-step staircase in center of gym; up/down with reciprocal pattern and BUE support; x 3  -pt opts for step-to pattern during final trial of stair descent Total Gym double limb squat, depth to tolerance; x 20, Level 22 Ambulation in gym with SPC x 100' - cues for heel strike - good follow through     Therapeutic Exercise - for improved soft tissue flexibility and extensibility as needed for ROM, improved strength as needed to improve performance of CKC activities/functional movements  Sit to stand; 2 x 10 with 6-lb Medball hold, treatment table at lowest setting   Minisquat with 6-lb Medball; 2 x 10  *not today* Seated LAQ, 5-lb ankle weight; 2 x 10 with 3 second hold each LE Supine SAQ; 2 x 10, 3 sec hold, blue bolster under R knee, 3-lb ankle weight NuStep; Level 4, x 5 minutes; seat at 7 - for improved soft tissue mobility and increase bilat knee ROM (R TKA most acute)    Manual Therapy - for symptom  modulation, soft tissue sensitivity and mobility, joint mobility, ROM   Patellar mobilizations (inferior/superior, medial/lateral) x 3 minutes  Scar massage; x 1 min bouts  each along proximal and distal scar  STM R distal rectus femoris and VMO; x 5 minutes  -notable hypersensitivity along distal substance of RF/VMO  R knee PROM with gentle overpressure into flexion/extension; x 5 minutes       PATIENT EDUCATION:  Education details: see above for patient education details Person educated: Patient Education method: Explanation, Demonstration, and Handouts Education comprehension: verbalized understanding and returned demonstration   HOME EXERCISE PROGRAM:  Pt continuing with established home exercises from TKA packet  -pt instructed on propping knee for gravity-assisted extension stretch  Access Code: [email or web address] URL: [email or web address] Date: 04/01/2024 Prepared by: Venetia Endo  Exercises - Sit to Stand  - 1 x daily - 5 x weekly - 2 sets - 10 reps - Standing Double Leg Mini Squat  - 1 x daily - 5 x weekly - 2 sets - 10 reps - Forward Step Up with Counter Support  - 1 x daily - 5 x weekly - 2 sets - 10 reps  ASSESSMENT:  CLINICAL IMPRESSION:   Patient has normalizing ROM in bilateral knees and is exhibiting minimal antalgic gait pattern/asymmetry at this time. She does have ongoing nocturnal pain related to peri-incisional sensitivity along superior substance and VMO region. Pt does have remaining stiffness, but she is markedly improving with LE strength and function. Pt needs further work on full ROM and strengthening prior to return to full-time work. Pt will continue to benefit from skilled PT services to address deficits and improve function.   OBJECTIVE IMPAIRMENTS: Abnormal gait, decreased activity tolerance, decreased mobility, difficulty walking, decreased ROM, decreased strength, hypomobility, impaired flexibility, and pain.   ACTIVITY  LIMITATIONS: carrying, lifting, sitting, standing, squatting, stairs, transfers, bed mobility, and locomotion level  PARTICIPATION LIMITATIONS: meal prep, cleaning, driving, shopping, community activity, and occupation  PERSONAL FACTORS: Past/current experiences and 3+ comorbidities: (currently being treated for bacteremia, ankylosing spondylitis, polyarthralgia, dyslipidemia) are also affecting patient's functional outcome.   REHAB POTENTIAL: Good  CLINICAL DECISION MAKING: Evolving/moderate complexity  EVALUATION COMPLEXITY: Moderate   GOALS: Goals reviewed with patient? Yes  SHORT TERM GOALS: Target date: 03/20/2024  Pt will be independent with HEP to improve strength and decrease knee pain to improve pain-free function at home and work. Baseline: 03/04/24: Pt continuing established HEP from hospital/TKA packet; discussed knee extension propped stretch and frequent work on heel slides.    04/07/24: *** Goal status: INITIAL   LONG TERM GOALS: Target date: 04/24/2024  Pt will have bilateral knee AROM 0-115 deg at least as needed for performance of ADLs and normalized extension for gait pattern Baseline: 03/04/24: Knee AROM R -4-86, L -2-110. 04/07/24: *** Goal status: INITIAL  2.  Pt will decrease worst knee pain by at least 3 points on the NPRS in order to demonstrate clinically significant reduction in knee pain. Baseline: 03/04/24: 6/10 pain at worst.  04/07/24: *** Goal status: INITIAL  3.  Pt will decrease LEFS score by at least 9 points in order demonstrate clinically significant reduction in knee pain/disability.       Baseline: 03/04/24: 30/80.  04/07/24: *** Goal status: INITIAL  4.  Pt will increase strength of bilateral hip flexors and quads/hamstrings to at least 4+/5 or greater MMT grade in order to demonstrate improvement in strength and function needed for transferring and improved lower limb stability.,  Baseline: 03/04/24: MMT 3+ to 4 for aforementioned muscle  groups. 04/07/24: *** Goal status: INITIAL  5.  Pt will ambulate at community level (660 feet or greater in office)  with no AD and symmetrical weightbearing on either LE without major deviations as needed for return to independent mobility and return to work Baseline: 03/04/24: Limited distance tolerated for walking s/p R TKA. 04/07/24: *** Goal status: INITIAL   PLAN: PT FREQUENCY: 1-2x/week  PT DURATION: 6-8 weeks  PLANNED INTERVENTIONS: Therapeutic exercises, Therapeutic activity, Neuromuscular re-education, Balance training, Gait training, Patient/Family education, Self Care, Joint mobilization, DME instructions, Dry Needling, Electrical stimulation, Cryotherapy, Moist heat, Taping, Manual therapy, and Re-evaluation.  PLAN FOR NEXT SESSION: Knee ROM, quad/HS/hip strengthening, progressive weightbearing and stepping drills to work on weaning toward LRAD and then to no AD.    Venetia Endo, PT, DPT #[id number]  Venetia ONEIDA Endo, PT 04/06/2024, 11:01 PM

## 2024-04-07 ENCOUNTER — Telehealth: Payer: Self-pay

## 2024-04-07 ENCOUNTER — Ambulatory Visit: Admitting: Physical Therapy

## 2024-04-07 DIAGNOSIS — G8918 Other acute postprocedural pain: Secondary | ICD-10-CM

## 2024-04-07 DIAGNOSIS — M6281 Muscle weakness (generalized): Secondary | ICD-10-CM

## 2024-04-07 DIAGNOSIS — M25661 Stiffness of right knee, not elsewhere classified: Secondary | ICD-10-CM

## 2024-04-07 DIAGNOSIS — R262 Difficulty in walking, not elsewhere classified: Secondary | ICD-10-CM

## 2024-04-07 NOTE — Telephone Encounter (Signed)
 Received fax from [facility] requesting copies of notes and labs from 12/11/23 to present. Faxed note and labs from last visit.  Lorenda CHRISTELLA Code, RMA

## 2024-04-08 ENCOUNTER — Encounter: Admitting: Physical Therapy

## 2024-04-09 ENCOUNTER — Ambulatory Visit: Admitting: Physical Therapy

## 2024-04-09 ENCOUNTER — Encounter: Payer: Self-pay | Admitting: Physical Therapy

## 2024-04-09 DIAGNOSIS — M6281 Muscle weakness (generalized): Secondary | ICD-10-CM

## 2024-04-09 DIAGNOSIS — M25661 Stiffness of right knee, not elsewhere classified: Secondary | ICD-10-CM | POA: Diagnosis not present

## 2024-04-09 DIAGNOSIS — M25561 Pain in right knee: Secondary | ICD-10-CM | POA: Diagnosis not present

## 2024-04-09 DIAGNOSIS — G8918 Other acute postprocedural pain: Secondary | ICD-10-CM | POA: Diagnosis not present

## 2024-04-09 DIAGNOSIS — R262 Difficulty in walking, not elsewhere classified: Secondary | ICD-10-CM | POA: Diagnosis not present

## 2024-04-09 NOTE — Therapy (Unsigned)
 OUTPATIENT PHYSICAL THERAPY TREATMENT AND PROGRESS NOTE   Dates of reporting period  03/04/24   to   04/07/24   Patient Name: Hailey [name] MRN: [id number] DOB:Hailey [name], Hailey [name], 48 y.o., female Today's Date:04/09/2024   END OF SESSION:  PT End of Session - 04/09/24 0909     Visit Number 10    Number of Visits 17    Date for PT Re-Evaluation 05/01/24    Authorization Type [employer] [id number]    PT Start Time 0906    PT Stop Time 0945    PT Time Calculation (min) 39 min    Activity Tolerance Patient tolerated treatment well    Behavior During Therapy [facility] for tasks assessed/performed          Past Medical History:  Diagnosis Date   Allergic state    Allergy    Ankylosing spondylitis (HCC)    followed by rheumatology Dr. Tobie   Arthritis    Asthma    Cervical disc disease    Chest pain    Depression    Enlarged lymph nodes    GERD (gastroesophageal reflux disease)    H/O colonoscopy    HPV (human papilloma virus) infection    Hypothyroidism    Interstitial cystitis    LGSIL (low grade squamous intraepithelial dysplasia)    Paresthesia    Pre-diabetes    Status post laparoscopic cholecystectomy [name]/10/2015   Past Surgical History:  Procedure Laterality Date   BREAST BIOPSY Right 10/18/2015   bx/clip- neg   CHOLECYSTECTOMY N/A 07/07/2016   Procedure: LAPAROSCOPIC CHOLECYSTECTOMY;  Surgeon: Aloysius Plant, MD;  Location: [facility] ORS;  Service: General;  Laterality: N/A;   COLONOSCOPY WITH PROPOFOL  N/A 10/11/2015   Procedure: COLONOSCOPY WITH PROPOFOL ;  Surgeon: Gladis RAYMOND Mariner, MD;  Location: [facility] ENDOSCOPY;  Service: Endoscopy;  Laterality: N/A;   ESOPHAGOGASTRODUODENOSCOPY (EGD) WITH PROPOFOL  N/A 10/11/2015   Procedure: ESOPHAGOGASTRODUODENOSCOPY (EGD) WITH PROPOFOL ;  Surgeon: Gladis RAYMOND Mariner, MD;  Location: [facility] ENDOSCOPY;  Service: Endoscopy;  Laterality: N/A;   TONSILLECTOMY     TOTAL KNEE ARTHROPLASTY Left 01/08/2024   Procedure: LEFT TOTAL KNEE ARTHROPLASTYL;  Surgeon: Ernie Cough, MD;  Location: [facility] ORS;  Service: Orthopedics;  Laterality: Left;   TOTAL KNEE ARTHROPLASTY Right 02/19/2024   Procedure: ARTHROPLASTY, KNEE, TOTAL;  Surgeon: Ernie Cough, MD;  Location: [facility] ORS;  Service: Orthopedics;  Laterality: Right;   TOTAL THYROIDECTOMY  2005   goiter   Patient Active Problem List   Diagnosis Date Noted   Bacteremia 02/27/2024   S/P total knee arthroplasty, right 02/19/2024   S/P total knee arthroplasty, left 01/08/2024   S/P total knee arthroplasty 01/08/2024   Atypical chest pain 11/23/2023   Preop cardiovascular exam 11/23/2023   Dyslipidemia 11/23/2023   Generalized lymphadenopathy 09/23/2021   B12 deficiency 09/23/2021   Fatigue 09/23/2021   Primary osteoarthritis of both knees 04/29/2021   Ankylosing spondylitis (HCC) 12/30/2018   Asthma in adult without complication 12/30/2018   Chronic diarrhea 12/30/2018   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   Interstitial cystitis 07/03/2016   Pelvic pain in female 07/03/2016   Chondromalacia of knee, 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   BMI 38.0-38.9,adult 02/14/2016   Polyarthralgia 02/14/2016   GERD (gastroesophageal reflux disease) 02/10/2014    PCP: Justus Leita DEL, MD  REFERRING PROVIDER: Patti Rosina SAUNDERS, PA-C  REFERRING DIAG: M17.[name] (ICD-10-CM) - Unilateral primary osteoarthritis, right knee   RATIONALE FOR EVALUATION AND TREATMENT: Rehabilitation  THERAPY DIAG: Acute postoperative pain of right knee  Stiffness of right knee, not elsewhere  classified  Difficulty in walking, not elsewhere classified  Muscle weakness (generalized)  ONSET DATE: R TKA 02/19/24, Hx L TKA 01/08/24  FOLLOW-UP APPT SCHEDULED WITH REFERRING PROVIDER: Yes ; pt f/u with surgeon 03/06/24   PERTINENT HISTORY: Pt is a 48 year old female known to this clinic with Hx of L TKA 01/08/24 who underwent post-op rehab with good ROM progress. Pt is currently s/p R TKA 02/19/24.   Pt has unfortunately had significant challenges with  early post-op phase during both knee replacements. Pt had ED visits on 6/13 and 6/14 related to nausea and weakness following Sx. Infection, CVD, PE, and acute process in abdomen/pelvis ruled out. Pt had additional hospital admission on 02/27/24 due to bacteremia. She was told infection was possibly due to Foley catheter. Pt is following up with Dr. Justus this Friday to f/u on infection status.   Pt had second culture without significant bacterial growth after initiating antibiotic. Patient reports some ongoing dysuria; pt thinks she may have had hematuria. Patient reports bilateral knee tightness/stiffness. Pt was told by acute care PT she is somewhat behind on ROM. Pt reports intermittent buckling into extension with longer stride. No concerning S&S along post-op incision.    PAIN:   Pain Intensity: Present: 2/10, Best: 2/10, Worst: 6/10 as if week of IE Pain location: Peri-incisional pain Pain quality: aching  Radiating pain: some muscle pain in calf  Swelling: Yes  Numbness/Tingling: Yes; around incision and tibial plateau region  Focal weakness or buckling: Yes; pt buckling into hyperextension Aggravating factors: trying to sleep/lie down, maintaining R knee straight/fully extended Relieving factors: ice, exercise/stretching  24-hour pain behavior: worse in AM, later in evenig  History of prior back, hip, or knee injury, pain, surgery, or therapy: Yes; hx of L TKA 01/08/24, R 02/19/24  Imaging: Yes ; post-op X-ray, swelling noted but no infection ruled in  Prior level of function: Independent Occupational demands: CT tech, X-ray tech; OR and dental imaging; substantial time walking/standing  Hobbies: going to lake - boating; walking dog/walking exercise  Red flags: Negative for personal history of cancer, chills/fever, night sweats, nausea, vomiting, unexplained weight gain/loss, unrelenting pain  PRECAUTIONS: None  WEIGHT BEARING RESTRICTIONS: WBAT  FALLS: Has patient fallen in  last 6 months? No  Living Environment Lives with: lives with their spouse and lives with their daughter 786 y/o daughter) Lives in: House/apartment No steps to get into home; one-level home, handicap accessible, shower seat  Patient Goals: Able to return to work      OBJECTIVE (data from initial evaluation unless otherwise dated):   Patient Surveys  LEFS = 30/80 = 37.5%    Gross Musculoskeletal Assessment Tremor: None Bulk: Atrophy of R>L quadriceps, moderate Tone: Normal  GAIT: Distance walked: 80 ft Assistive device utilized: Environmental consultant - 2 wheeled Level of assistance: SBA Comments: Moderate dec terminal knee extension with swing phase   AROM AROM (Normal range in degrees) AROM 03/04/24 AROM 04/09/24  Hip Right Left Right Left  Flexion (125)      Extension (15)      Abduction (40)      Adduction       Internal Rotation (45)      External Rotation (45)            Knee      Flexion (135) 86 110 111 113  Extension (0) -4 -2 -2 -4        Ankle      Dorsiflexion (20) 8 8 To neutral  6  Plantarflexion (50) WNL WNL WNL WNL  Inversion (35)      Eversion (15      (* = pain; Blank rows = not tested)  LE MMT: MMT (out of 5) Right 03/04/24 Left 03/04/24 Right 04/09/24 Left 04/09/24  Hip flexion 4- 4 4 4+  Hip extension      Hip abduction (seated) 5 5 5 5   Hip adduction (seated) 5 5 5 5   Hip internal rotation      Hip external rotation      Knee flexion 4- 4 4-* 4-*  Knee extension 3+ 4+ 3+* 3+*  Ankle dorsiflexion      Ankle plantarflexion      Ankle inversion      Ankle eversion      (* = pain; Blank rows = not tested)  Sensation Deferred  Reflexes Deferred  Palpation Location LEFT  RIGHT           Quadriceps  1  Medial Hamstrings    Lateral Hamstrings    Lateral Hamstring tendon    Medial Hamstring tendon    Quadriceps tendon    Patella  1  Patellar Tendon  1  Tibial Tuberosity  2  Medial joint line 1 2  Lateral joint line 1 1  MCL    LCL     Adductor Tubercle    Pes Anserine tendon    Infrapatellar fat pad    Fibular head    Popliteal fossa    (Blank rows = not tested) Graded on 0-4 scale (0 = no pain, 1 = pain, 2 = pain with wincing/grimacing/flinching, 3 = pain with withdrawal, 4 = unwilling to allow palpation), (Blank rows = not tested)  Passive Accessory Motion Deferred   VASCULAR Dorsalis pedis and posterior tibial pulses are palpable No sign of acute DVT Homan's sign: Negative bilat     TODAY'S TREATMENT: 04/09/24   S/p R TKA 02/19/24, L TKA 01/08/24    SUBJECTIVE STATEMENT:   Patient reports 3/10 pain at arrival. She reports that her L knee (further along post-operatively) did well with first day of half-day at work yesterday. Patient reports R knee had more pain - she was in tears and had difficulty sleeping last night. Patient reports her R knee feels swollen and tight with functional mobility. Patient reports doing well last Thur-Friday. She reports painful popping Saturday. She felt better Monday, but she had notable pain at work.     Therapeutic Activities - closed-chain movements and ambulatory tasks mimicking functional activities/ADLs  *GOAL UPDATE PERFORMED   Ambulate laps in hallway; x 10.5 crossing length of hall (70-ft hall)  -mild L trunk lean with L stance phase, normal heel strike and step/stride length   PATIENT EDUCATION: Discussed current progress with PT, continued PT POC   *not today* TRX squats; 2 x 10, depth to tolerance Forward dynamic minilunge in // bars; x 15 alt R/L // bars: Minisquat; 2 x 10 // bars: forward/retro stepping; 4x D/B  -retro step for terminal knee extension Stairs; 4-step staircase in center of gym; up/down with reciprocal pattern and BUE support; x 3  -pt opts for step-to pattern during final trial of stair descent Total Gym double limb squat, depth to tolerance; x 20, Level 22 Ambulation in gym with SPC x 100' - cues for heel strike - good follow through      Therapeutic Exercise - for improved soft tissue flexibility and extensibility as needed for ROM, improved strength as needed to improve  performance of CKC activities/functional movements  NuStep; Level 4, x 5 minutes; seat at 7 - for improved soft tissue mobility and increase bilat knee ROM (R TKA most acute)    *not today* Sit to stand; 2 x 10 with 6-lb Medball hold, treatment table at lowest setting  Minisquat with 6-lb Medball; 2 x 10 Seated LAQ, 5-lb ankle weight; 2 x 10 with 3 second hold each LE Supine SAQ; 2 x 10, 3 sec hold, blue bolster under R knee, 3-lb ankle weight     PATIENT EDUCATION:  Education details: see above for patient education details Person educated: Patient Education method: Explanation, Demonstration, and Handouts Education comprehension: verbalized understanding and returned demonstration   HOME EXERCISE PROGRAM:  Pt continuing with established home exercises from TKA packet  -pt instructed on propping knee for gravity-assisted extension stretch  Access Code: [email or web address] URL: [email or web address] Date: 04/01/2024 Prepared by: Venetia Endo  Exercises - Sit to Stand  - 1 x daily - 5 x weekly - 2 sets - 10 reps - Standing Double Leg Mini Squat  - 1 x daily - 5 x weekly - 2 sets - 10 reps - Forward Step Up with Counter Support  - 1 x daily - 5 x weekly - 2 sets - 10 reps  ASSESSMENT:  CLINICAL IMPRESSION:   Today's visit focused largely on updating goals and discussing continued POC in context of patient returning to work. She had notable challenge with first 6-hour day (half day versus her usual schedule) with R knee hurting more. Her L knee is further along post-operatively and does not seem to limit her as much with community-level mobility. She is ROM for each side approaching WFL. She has good knee extension and mild knee flexion motion loss bilat. Pt has poor quad/HS MMTs today secondary to pain inhibition and fear from pt.  Pt has fortunately demonstrated clinically significant progress with LEFS goal. Pt has remaining deficits in: quad/HS/ hip flexor strength, R more than L knee ROM deficit, R>L knee stiffness, intermittent post-op edema, and mild remaining gait changes. Pt will continue to benefit from skilled PT services to address deficits and improve function.   OBJECTIVE IMPAIRMENTS: Abnormal gait, decreased activity tolerance, decreased mobility, difficulty walking, decreased ROM, decreased strength, hypomobility, impaired flexibility, and pain.   ACTIVITY LIMITATIONS: carrying, lifting, sitting, standing, squatting, stairs, transfers, bed mobility, and locomotion level  PARTICIPATION LIMITATIONS: meal prep, cleaning, driving, shopping, community activity, and occupation  PERSONAL FACTORS: Past/current experiences and 3+ comorbidities: (currently being treated for bacteremia, ankylosing spondylitis, polyarthralgia, dyslipidemia) are also affecting patient's functional outcome.   REHAB POTENTIAL: Good  CLINICAL DECISION MAKING: Evolving/moderate complexity  EVALUATION COMPLEXITY: Moderate   GOALS: Goals reviewed with patient? Yes  SHORT TERM GOALS: Target date: 03/20/2024  Pt will be independent with HEP to improve strength and decrease knee pain to improve pain-free function at home and work. Baseline: 03/04/24: Pt continuing established HEP from hospital/TKA packet; discussed knee extension propped stretch and frequent work on heel slides.    04/09/24: Patient is consistent and verbalizes understanding of her home program.  Goal status: ACHIEVED   LONG TERM GOALS: Target date: 04/24/2024  Pt will have bilateral knee AROM 0-115 deg at least as needed for performance of ADLs and normalized extension for gait pattern Baseline: 03/04/24: Knee AROM R -4-86, L -2-110. 04/09/24: WFL extension bilaterally, mild deficit with knee flexion bilat.  Goal status: IN PROGRESS   2.  Pt will decrease worst knee pain  by at  least 3 points on the NPRS in order to demonstrate clinically significant reduction in knee pain. Baseline: 03/04/24: 6/10 pain at worst.  04/09/24: 5-6/10 at worst.  Goal status: NOT MET   3.  Pt will decrease LEFS score by at least 9 points in order demonstrate clinically significant reduction in knee pain/disability.       Baseline: 03/04/24: 30/80.  04/09/24: 46/80  Goal status: ACHIEVED   4.  Pt will increase strength of bilateral hip flexors and quads/hamstrings to at least 4+/5 or greater MMT grade in order to demonstrate improvement in strength and function needed for transferring and improved lower limb stability.,  Baseline: 03/04/24: MMT 3+ to 4 for aforementioned muscle groups. 04/09/24: Improved hip flexor strength, no improvement quad/HS Goal status: ON-GOING   5.  Pt will ambulate at community level (660 feet or greater in office) with no AD and symmetrical weightbearing on either LE without major deviations as needed for return to independent mobility and return to work Baseline: 03/04/24: Limited distance tolerated for walking s/p R TKA. 04/09/24: 735 ft completed today, no notable increase in pain Goal status: ACHIEVED    PLAN: PT FREQUENCY: 1-2x/week  PT DURATION: 4 weeks  PLANNED INTERVENTIONS: Therapeutic exercises, Therapeutic activity, Neuromuscular re-education, Balance training, Gait training, Patient/Family education, Self Care, Joint mobilization, DME instructions, Dry Needling, Electrical stimulation, Cryotherapy, Moist heat, Taping, Manual therapy, and Re-evaluation.  PLAN FOR NEXT SESSION: Advance-phase rehab with focus on CKC progressive exercise, achieving full end-range knee ROM bilat, LE strengthening.    Venetia Endo, PT, DPT #[id number]  Venetia ONEIDA Endo, PT 04/09/2024, 9:09 AM

## 2024-04-10 ENCOUNTER — Encounter: Admitting: Physical Therapy

## 2024-04-14 ENCOUNTER — Encounter: Payer: Self-pay | Admitting: Physical Therapy

## 2024-04-14 ENCOUNTER — Encounter: Admitting: Physical Therapy

## 2024-04-14 ENCOUNTER — Ambulatory Visit: Attending: Student | Admitting: Physical Therapy

## 2024-04-14 DIAGNOSIS — M25561 Pain in right knee: Secondary | ICD-10-CM | POA: Insufficient documentation

## 2024-04-14 DIAGNOSIS — G8918 Other acute postprocedural pain: Secondary | ICD-10-CM | POA: Diagnosis not present

## 2024-04-14 DIAGNOSIS — R262 Difficulty in walking, not elsewhere classified: Secondary | ICD-10-CM | POA: Insufficient documentation

## 2024-04-14 DIAGNOSIS — M25661 Stiffness of right knee, not elsewhere classified: Secondary | ICD-10-CM | POA: Insufficient documentation

## 2024-04-14 DIAGNOSIS — M6281 Muscle weakness (generalized): Secondary | ICD-10-CM | POA: Diagnosis not present

## 2024-04-14 NOTE — Therapy (Signed)
 OUTPATIENT PHYSICAL THERAPY TREATMENT    Patient Name: Hailey [name] MRN: [id number] DOB:04/03/1976, 48 y.o., female Today's Date: 04/14/2024   END OF SESSION:  PT End of Session - 04/14/24 1418     Visit Number 11    Number of Visits 17    Date for PT Re-Evaluation 05/01/24    Authorization Type [employer] [id number]    PT Start Time 1416    PT Stop Time 1458    PT Time Calculation (min) 42 min    Activity Tolerance Patient tolerated treatment well    Behavior During Therapy [facility] for tasks assessed/performed           Past Medical History:  Diagnosis Date   Allergic state    Allergy    Ankylosing spondylitis (HCC)    followed by rheumatology Dr. Tobie   Arthritis    Asthma    Cervical disc disease    Chest pain    Depression    Enlarged lymph nodes    GERD (gastroesophageal reflux disease)    H/O colonoscopy    HPV (human papilloma virus) infection    Hypothyroidism    Interstitial cystitis    LGSIL (low grade squamous intraepithelial dysplasia)    Paresthesia    Pre-diabetes    Status post laparoscopic cholecystectomy 07/13/2016   Past Surgical History:  Procedure Laterality Date   BREAST BIOPSY Right 10/18/2015   bx/clip- neg   CHOLECYSTECTOMY N/A 07/07/2016   Procedure: LAPAROSCOPIC CHOLECYSTECTOMY;  Surgeon: Aloysius Plant, MD;  Location: [facility] ORS;  Service: General;  Laterality: N/A;   COLONOSCOPY WITH PROPOFOL  N/A 10/11/2015   Procedure: COLONOSCOPY WITH PROPOFOL ;  Surgeon: Gladis RAYMOND Mariner, MD;  Location: [facility] ENDOSCOPY;  Service: Endoscopy;  Laterality: N/A;   ESOPHAGOGASTRODUODENOSCOPY (EGD) WITH PROPOFOL  N/A 10/11/2015   Procedure: ESOPHAGOGASTRODUODENOSCOPY (EGD) WITH PROPOFOL ;  Surgeon: Gladis RAYMOND Mariner, MD;  Location: [facility] ENDOSCOPY;  Service: Endoscopy;  Laterality: N/A;   TONSILLECTOMY     TOTAL KNEE ARTHROPLASTY Left 01/08/2024   Procedure: LEFT TOTAL KNEE ARTHROPLASTYL;  Surgeon: Ernie Cough, MD;  Location: [facility] ORS;  Service: Orthopedics;  Laterality:  Left;   TOTAL KNEE ARTHROPLASTY Right 02/19/2024   Procedure: ARTHROPLASTY, KNEE, TOTAL;  Surgeon: Ernie Cough, MD;  Location: [facility] ORS;  Service: Orthopedics;  Laterality: Right;   TOTAL THYROIDECTOMY  2005   goiter   Patient Active Problem List   Diagnosis Date Noted   Bacteremia 02/27/2024   S/P total knee arthroplasty, right 02/19/2024   S/P total knee arthroplasty, left 01/08/2024   S/P total knee arthroplasty 01/08/2024   Atypical chest pain 11/23/2023   Preop cardiovascular exam 11/23/2023   Dyslipidemia 11/23/2023   Generalized lymphadenopathy 09/23/2021   B12 deficiency 09/23/2021   Fatigue 09/23/2021   Primary osteoarthritis of both knees 04/29/2021   Ankylosing spondylitis (HCC) 12/30/2018   Asthma in adult without complication 12/30/2018   Chronic diarrhea 12/30/2018   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   Interstitial cystitis 07/03/2016   Pelvic pain in female 07/03/2016   Chondromalacia of knee, 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   BMI 38.0-38.9,adult 02/14/2016   Polyarthralgia 02/14/2016   GERD (gastroesophageal reflux disease) 02/10/2014    PCP: Justus Leita DEL, MD  REFERRING PROVIDER: Patti Rosina SAUNDERS, PA-C  REFERRING DIAG: M17.11 (ICD-10-CM) - Unilateral primary osteoarthritis, right knee   RATIONALE FOR EVALUATION AND TREATMENT: Rehabilitation  THERAPY DIAG: Acute postoperative pain of right knee  Stiffness of right knee, not elsewhere classified  Difficulty in walking, not elsewhere classified  Muscle weakness (generalized)  ONSET  DATE: R TKA 02/19/24, Hx L TKA 01/08/24  FOLLOW-UP APPT SCHEDULED WITH REFERRING PROVIDER: Yes ; pt f/u with surgeon 03/06/24   PERTINENT HISTORY: Pt is a 48 year old female known to this clinic with Hx of L TKA 01/08/24 who underwent post-op rehab with good ROM progress. Pt is currently s/p R TKA 02/19/24.   Pt has unfortunately had significant challenges with early post-op phase during both knee replacements. Pt had ED visits on  6/13 and 6/14 related to nausea and weakness following Sx. Infection, CVD, PE, and acute process in abdomen/pelvis ruled out. Pt had additional hospital admission on 02/27/24 due to bacteremia. She was told infection was possibly due to Foley catheter. Pt is following up with Dr. Justus this Friday to f/u on infection status.   Pt had second culture without significant bacterial growth after initiating antibiotic. Patient reports some ongoing dysuria; pt thinks she may have had hematuria. Patient reports bilateral knee tightness/stiffness. Pt was told by acute care PT she is somewhat behind on ROM. Pt reports intermittent buckling into extension with longer stride. No concerning S&S along post-op incision.    PAIN:   Pain Intensity: Present: 2/10, Best: 2/10, Worst: 6/10 as if week of IE Pain location: Peri-incisional pain Pain quality: aching  Radiating pain: some muscle pain in calf  Swelling: Yes  Numbness/Tingling: Yes; around incision and tibial plateau region  Focal weakness or buckling: Yes; pt buckling into hyperextension Aggravating factors: trying to sleep/lie down, maintaining R knee straight/fully extended Relieving factors: ice, exercise/stretching  24-hour pain behavior: worse in AM, later in evenig  History of prior back, hip, or knee injury, pain, surgery, or therapy: Yes; hx of L TKA 01/08/24, R 02/19/24  Imaging: Yes ; post-op X-ray, swelling noted but no infection ruled in  Prior level of function: Independent Occupational demands: CT tech, X-ray tech; OR and dental imaging; substantial time walking/standing  Hobbies: going to lake - boating; walking dog/walking exercise  Red flags: Negative for personal history of cancer, chills/fever, night sweats, nausea, vomiting, unexplained weight gain/loss, unrelenting pain  PRECAUTIONS: None  WEIGHT BEARING RESTRICTIONS: WBAT  FALLS: Has patient fallen in last 6 months? No  Living Environment Lives with: lives with their  spouse and lives with their daughter 48 y/o daughter) Lives in: House/apartment No steps to get into home; one-level home, handicap accessible, shower seat  Patient Goals: Able to return to work      OBJECTIVE (data from initial evaluation unless otherwise dated):   Patient Surveys  LEFS = 30/80 = 37.5%    Gross Musculoskeletal Assessment Tremor: None Bulk: Atrophy of R>L quadriceps, moderate Tone: Normal  GAIT: Distance walked: 80 ft Assistive device utilized: Environmental consultant - 2 wheeled Level of assistance: SBA Comments: Moderate dec terminal knee extension with swing phase   AROM AROM (Normal range in degrees) AROM 03/04/24 AROM 04/09/24  Hip Right Left Right Left  Flexion (125)      Extension (15)      Abduction (40)      Adduction       Internal Rotation (45)      External Rotation (45)            Knee      Flexion (135) 86 110 111 113  Extension (0) -4 -2 -2 -4        Ankle      Dorsiflexion (20) 8 8 To neutral  6  Plantarflexion (50) WNL WNL WNL WNL  Inversion (35)  Eversion (15      (* = pain; Blank rows = not tested)  LE MMT: MMT (out of 5) Right 03/04/24 Left 03/04/24 Right 04/09/24 Left 04/09/24  Hip flexion 4- 4 4 4+  Hip extension      Hip abduction (seated) 5 5 5 5   Hip adduction (seated) 5 5 5 5   Hip internal rotation      Hip external rotation      Knee flexion 4- 4 4-* 4-*  Knee extension 3+ 4+ 3+* 3+*  Ankle dorsiflexion      Ankle plantarflexion      Ankle inversion      Ankle eversion      (* = pain; Blank rows = not tested)  Sensation Deferred  Reflexes Deferred  Palpation Location LEFT  RIGHT           Quadriceps  1  Medial Hamstrings    Lateral Hamstrings    Lateral Hamstring tendon    Medial Hamstring tendon    Quadriceps tendon    Patella  1  Patellar Tendon  1  Tibial Tuberosity  2  Medial joint line 1 2  Lateral joint line 1 1  MCL    LCL    Adductor Tubercle    Pes Anserine tendon    Infrapatellar fat pad     Fibular head    Popliteal fossa    (Blank rows = not tested) Graded on 0-4 scale (0 = no pain, 1 = pain, 2 = pain with wincing/grimacing/flinching, 3 = pain with withdrawal, 4 = unwilling to allow palpation), (Blank rows = not tested)  Passive Accessory Motion Deferred   VASCULAR Dorsalis pedis and posterior tibial pulses are palpable No sign of acute DVT Homan's sign: Negative bilat     TODAY'S TREATMENT: 04/14/24   S/p R TKA 02/19/24, L TKA 01/08/24    SUBJECTIVE STATEMENT:   Patient reports 2/10 pain at arrival today. She reports having difficulty this past Thursday with half days. She feels that after one more week of half-days, she will be able to progress to full-time work from there. Patient reports having notable pain with spasms in R thigh/knee the previous night. Pt reports walking around town with her husband and their friends; she tolerated this activity well and wasn't as concerned with distance or having to use stairs as she would have been pre-operatively.      Therapeutic Exercise - for improved soft tissue flexibility and extensibility as needed for ROM, improved strength as needed to improve performance of CKC activities/functional movements  NuStep; Level 5 x 6 minutes; seat at 7 - for improved soft tissue mobility and increase bilat knee ROM (R TKA most acute)  Sit to stand; 2 x 10 with 8-lb Medball hold, from standard chair (19 height)   *not today* Minisquat with 6-lb Medball; 2 x 10 Seated LAQ, 5-lb ankle weight; 2 x 10 with 3 second hold each LE Supine SAQ; 2 x 10, 3 sec hold, blue bolster under R knee, 3-lb ankle weight    Therapeutic Activities - closed-chain movements and ambulatory tasks mimicking functional activities/ADLs   Dynamic march, blue agility ladder; 4x D/B  FSU to follow-through, 6-inch step; x15 on either LE  Forward dynamic minilunge, adjacent to treadmill armrest for UE support; x 15 alt R/L  PATIENT EDUCATION: Discussed  current progress with PT, continued HEP, and following up with MD if return to work is not improving   *not today* TRX squats; 2  x 10, depth to tolerance // bars: Minisquat; 2 x 10 // bars: forward/retro stepping; 4x D/B  -retro step for terminal knee extension Stairs; 4-step staircase in center of gym; up/down with reciprocal pattern and BUE support; x 3  -pt opts for step-to pattern during final trial of stair descent Total Gym double limb squat, depth to tolerance; x 20, Level 22 Ambulation in gym with SPC x 100' - cues for heel strike - good follow through     Manual Therapy - for symptom modulation, soft tissue sensitivity and mobility, joint mobility, ROM    Patellar mobilizations (inferior/superior, medial/lateral) x 3 minutes  TF mobilization gr I-II 2 x 30 sec, posterior for pain control  R knee PROM with gentle overpressure into flexion/extension; x 5 minutes        PATIENT EDUCATION:  Education details: see above for patient education details Person educated: Patient Education method: Explanation, Demonstration, and Handouts Education comprehension: verbalized understanding and returned demonstration   HOME EXERCISE PROGRAM:  Pt continuing with established home exercises from TKA packet  -pt instructed on propping knee for gravity-assisted extension stretch  Access Code: [email or web address] URL: [email or web address] Date: 04/01/2024 Prepared by: Venetia Endo  Exercises - Sit to Stand  - 1 x daily - 5 x weekly - 2 sets - 10 reps - Standing Double Leg Mini Squat  - 1 x daily - 5 x weekly - 2 sets - 10 reps - Forward Step Up with Counter Support  - 1 x daily - 5 x weekly - 2 sets - 10 reps  ASSESSMENT:  CLINICAL IMPRESSION:   Patient has made excellent progress to date with recovery of gait without reliance of AD and ability to negotiate steps/obstacles. She is progressing very well with ability to complete transfers (e.g. sit to stand) and squatting  in limited ROM. Pt does have notable R knee pain remaining that is worse after prolonged weightbearing activity and is interrupting her sleep on any nights. L knee fortunately has minimal pain at this time. Pt is one day from being 8 weeks post-op for R knee; L knee is just over 3 months post-op. R knee is expected to progress similarly to L knee with routine healing. Pt has remaining deficits in: quad/HS/ hip flexor strength, R more than L knee ROM deficit, R>L knee stiffness, intermittent post-op edema, and mild remaining gait changes. Pt will continue to benefit from skilled PT services to address deficits and improve function.   OBJECTIVE IMPAIRMENTS: Abnormal gait, decreased activity tolerance, decreased mobility, difficulty walking, decreased ROM, decreased strength, hypomobility, impaired flexibility, and pain.   ACTIVITY LIMITATIONS: carrying, lifting, sitting, standing, squatting, stairs, transfers, bed mobility, and locomotion level  PARTICIPATION LIMITATIONS: meal prep, cleaning, driving, shopping, community activity, and occupation  PERSONAL FACTORS: Past/current experiences and 3+ comorbidities: (currently being treated for bacteremia, ankylosing spondylitis, polyarthralgia, dyslipidemia) are also affecting patient's functional outcome.   REHAB POTENTIAL: Good  CLINICAL DECISION MAKING: Evolving/moderate complexity  EVALUATION COMPLEXITY: Moderate   GOALS: Goals reviewed with patient? Yes  SHORT TERM GOALS: Target date: 03/20/2024  Pt will be independent with HEP to improve strength and decrease knee pain to improve pain-free function at home and work. Baseline: 03/04/24: Pt continuing established HEP from hospital/TKA packet; discussed knee extension propped stretch and frequent work on heel slides.    04/09/24: Patient is consistent and verbalizes understanding of her home program.  Goal status: ACHIEVED   LONG TERM GOALS: Target date: 04/24/2024  Pt will have bilateral knee  AROM 0-115 deg at least as needed for performance of ADLs and normalized extension for gait pattern Baseline: 03/04/24: Knee AROM R -4-86, L -2-110. 04/09/24: WFL extension bilaterally, mild deficit with knee flexion bilat.  Goal status: IN PROGRESS   2.  Pt will decrease worst knee pain by at least 3 points on the NPRS in order to demonstrate clinically significant reduction in knee pain. Baseline: 03/04/24: 6/10 pain at worst.  04/09/24: 5-6/10 at worst.  Goal status: NOT MET   3.  Pt will decrease LEFS score by at least 9 points in order demonstrate clinically significant reduction in knee pain/disability.       Baseline: 03/04/24: 30/80.  04/09/24: 46/80  Goal status: ACHIEVED   4.  Pt will increase strength of bilateral hip flexors and quads/hamstrings to at least 4+/5 or greater MMT grade in order to demonstrate improvement in strength and function needed for transferring and improved lower limb stability.,  Baseline: 03/04/24: MMT 3+ to 4 for aforementioned muscle groups. 04/09/24: Improved hip flexor strength, no improvement quad/HS Goal status: ON-GOING   5.  Pt will ambulate at community level (660 feet or greater in office) with no AD and symmetrical weightbearing on either LE without major deviations as needed for return to independent mobility and return to work Baseline: 03/04/24: Limited distance tolerated for walking s/p R TKA. 04/09/24: 735 ft completed today, no notable increase in pain Goal status: ACHIEVED    PLAN: PT FREQUENCY: 1-2x/week  PT DURATION: 4 weeks  PLANNED INTERVENTIONS: Therapeutic exercises, Therapeutic activity, Neuromuscular re-education, Balance training, Gait training, Patient/Family education, Self Care, Joint mobilization, DME instructions, Dry Needling, Electrical stimulation, Cryotherapy, Moist heat, Taping, Manual therapy, and Re-evaluation.  PLAN FOR NEXT SESSION: Advance-phase rehab with focus on CKC progressive exercise, achieving full end-range knee  ROM bilat, LE strengthening.    Venetia Endo, PT, DPT #[id number]  Venetia ONEIDA Endo, PT 04/14/2024, 5:06 PM

## 2024-04-15 ENCOUNTER — Encounter: Admitting: Physical Therapy

## 2024-04-16 ENCOUNTER — Other Ambulatory Visit: Payer: Self-pay

## 2024-04-17 ENCOUNTER — Encounter: Admitting: Physical Therapy

## 2024-04-17 ENCOUNTER — Other Ambulatory Visit: Payer: Self-pay

## 2024-04-18 ENCOUNTER — Other Ambulatory Visit: Payer: Self-pay

## 2024-04-18 ENCOUNTER — Ambulatory Visit: Admitting: Infectious Diseases

## 2024-04-21 ENCOUNTER — Encounter: Payer: Self-pay | Admitting: Physical Therapy

## 2024-04-21 ENCOUNTER — Other Ambulatory Visit: Payer: Self-pay

## 2024-04-21 ENCOUNTER — Ambulatory Visit: Admitting: Physical Therapy

## 2024-04-21 DIAGNOSIS — R262 Difficulty in walking, not elsewhere classified: Secondary | ICD-10-CM

## 2024-04-21 DIAGNOSIS — M6281 Muscle weakness (generalized): Secondary | ICD-10-CM | POA: Diagnosis not present

## 2024-04-21 DIAGNOSIS — M25661 Stiffness of right knee, not elsewhere classified: Secondary | ICD-10-CM | POA: Diagnosis not present

## 2024-04-21 DIAGNOSIS — G8918 Other acute postprocedural pain: Secondary | ICD-10-CM

## 2024-04-21 DIAGNOSIS — M25561 Pain in right knee: Secondary | ICD-10-CM

## 2024-04-21 NOTE — Therapy (Signed)
 OUTPATIENT PHYSICAL THERAPY TREATMENT/GOAL UPDATE   Patient Name: Hailey [name] MRN: [id number] DOB:[birth year], 48 y.o., female Today's Date: 04/21/2024   END OF SESSION:  PT End of Session - 04/21/24 1416     Visit Number 12    Number of Visits 17    Date for PT Re-Evaluation 05/01/24    Authorization Type [employer] [id number]    PT Start Time 1416    PT Stop Time 1457    PT Time Calculation (min) 41 min    Activity Tolerance Patient tolerated treatment well    Behavior During Therapy [facility] for tasks assessed/performed            Past Medical History:  Diagnosis Date   Allergic state    Allergy    Ankylosing spondylitis (HCC)    followed by rheumatology Dr. Tobie   Arthritis    Asthma    Cervical disc disease    Chest pain    Depression    Enlarged lymph nodes    GERD (gastroesophageal reflux disease)    H/O colonoscopy    HPV (human papilloma virus) infection    Hypothyroidism    Interstitial cystitis    LGSIL (low grade squamous intraepithelial dysplasia)    Paresthesia    Pre-diabetes    Status post laparoscopic cholecystectomy 07/13/2016   Past Surgical History:  Procedure Laterality Date   BREAST BIOPSY Right 10/18/2015   bx/clip- neg   CHOLECYSTECTOMY N/A 07/07/2016   Procedure: LAPAROSCOPIC CHOLECYSTECTOMY;  Surgeon: Aloysius Plant, MD;  Location: [facility] ORS;  Service: General;  Laterality: N/A;   COLONOSCOPY WITH PROPOFOL  N/A 10/11/2015   Procedure: COLONOSCOPY WITH PROPOFOL ;  Surgeon: Gladis RAYMOND Mariner, MD;  Location: [facility] ENDOSCOPY;  Service: Endoscopy;  Laterality: N/A;   ESOPHAGOGASTRODUODENOSCOPY (EGD) WITH PROPOFOL  N/A 10/11/2015   Procedure: ESOPHAGOGASTRODUODENOSCOPY (EGD) WITH PROPOFOL ;  Surgeon: Gladis RAYMOND Mariner, MD;  Location: [facility] ENDOSCOPY;  Service: Endoscopy;  Laterality: N/A;   TONSILLECTOMY     TOTAL KNEE ARTHROPLASTY Left 01/08/2024   Procedure: LEFT TOTAL KNEE ARTHROPLASTYL;  Surgeon: Ernie Cough, MD;  Location: [facility] ORS;  Service: Orthopedics;   Laterality: Left;   TOTAL KNEE ARTHROPLASTY Right 02/19/2024   Procedure: ARTHROPLASTY, KNEE, TOTAL;  Surgeon: Ernie Cough, MD;  Location: [facility] ORS;  Service: Orthopedics;  Laterality: Right;   TOTAL THYROIDECTOMY  2005   goiter   Patient Active Problem List   Diagnosis Date Noted   Bacteremia 02/27/2024   S/P total knee arthroplasty, right 02/19/2024   S/P total knee arthroplasty, left 01/08/2024   S/P total knee arthroplasty 01/08/2024   Atypical chest pain 11/23/2023   Preop cardiovascular exam 11/23/2023   Dyslipidemia 11/23/2023   Generalized lymphadenopathy 09/23/2021   B12 deficiency 09/23/2021   Fatigue 09/23/2021   Primary osteoarthritis of both knees 04/29/2021   Ankylosing spondylitis (HCC) 12/30/2018   Asthma in adult without complication 12/30/2018   Chronic diarrhea 12/30/2018   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   Interstitial cystitis 07/03/2016   Pelvic pain in female 07/03/2016   Chondromalacia of knee, 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   BMI 38.0-38.9,adult 02/14/2016   Polyarthralgia 02/14/2016   GERD (gastroesophageal reflux disease) 02/10/2014    PCP: Justus Leita DEL, MD  REFERRING PROVIDER: Patti Rosina SAUNDERS, PA-C  REFERRING DIAG: M17.11 (ICD-10-CM) - Unilateral primary osteoarthritis, right knee   RATIONALE FOR EVALUATION AND TREATMENT: Rehabilitation  THERAPY DIAG: Acute postoperative pain of right knee  Stiffness of right knee, not elsewhere classified  Difficulty in walking, not elsewhere classified  Muscle weakness (generalized)  ONSET DATE: R TKA 02/19/24, Hx L TKA 01/08/24  FOLLOW-UP APPT SCHEDULED WITH REFERRING PROVIDER: Yes ; pt f/u with surgeon 03/06/24   PERTINENT HISTORY: Pt is a 48 year old female known to this clinic with Hx of L TKA 01/08/24 who underwent post-op rehab with good ROM progress. Pt is currently s/p R TKA 02/19/24.   Pt has unfortunately had significant challenges with early post-op phase during both knee replacements. Pt had ED  visits on 6/13 and 6/14 related to nausea and weakness following Sx. Infection, CVD, PE, and acute process in abdomen/pelvis ruled out. Pt had additional hospital admission on 02/27/24 due to bacteremia. She was told infection was possibly due to Foley catheter. Pt is following up with Dr. Justus this Friday to f/u on infection status.   Pt had second culture without significant bacterial growth after initiating antibiotic. Patient reports some ongoing dysuria; pt thinks she may have had hematuria. Patient reports bilateral knee tightness/stiffness. Pt was told by acute care PT she is somewhat behind on ROM. Pt reports intermittent buckling into extension with longer stride. No concerning S&S along post-op incision.    PAIN:   Pain Intensity: Present: 2/10, Best: 2/10, Worst: 6/10 as if week of IE Pain location: Peri-incisional pain Pain quality: aching  Radiating pain: some muscle pain in calf  Swelling: Yes  Numbness/Tingling: Yes; around incision and tibial plateau region  Focal weakness or buckling: Yes; pt buckling into hyperextension Aggravating factors: trying to sleep/lie down, maintaining R knee straight/fully extended Relieving factors: ice, exercise/stretching  24-hour pain behavior: worse in AM, later in evenig  History of prior back, hip, or knee injury, pain, surgery, or therapy: Yes; hx of L TKA 01/08/24, R 02/19/24  Imaging: Yes ; post-op X-ray, swelling noted but no infection ruled in  Prior level of function: Independent Occupational demands: CT tech, X-ray tech; OR and dental imaging; substantial time walking/standing  Hobbies: going to [place] - boating; walking dog/walking exercise  Red flags: Negative for personal history of cancer, chills/fever, night sweats, nausea, vomiting, unexplained weight gain/loss, unrelenting pain  PRECAUTIONS: None  WEIGHT BEARING RESTRICTIONS: WBAT  FALLS: Has patient fallen in last 6 months? No  Living Environment Lives with: lives  with their spouse and lives with their daughter 48 y/o daughter) Lives in: House/apartment No steps to get into home; one-level home, handicap accessible, shower seat  Patient Goals: Able to return to work      OBJECTIVE (data from initial evaluation unless otherwise dated):   Patient Surveys  LEFS = 30/80 = 37.5%    Gross Musculoskeletal Assessment Tremor: None Bulk: Atrophy of R>L quadriceps, moderate Tone: Normal  GAIT: Distance walked: 80 ft Assistive device utilized: Environmental consultant - 2 wheeled Level of assistance: SBA Comments: Moderate dec terminal knee extension with swing phase   AROM AROM (Normal range in degrees) AROM 03/04/24 AROM 04/09/24  Hip Right Left Right Left  Flexion (125)      Extension (15)      Abduction (40)      Adduction       Internal Rotation (45)      External Rotation (45)            Knee      Flexion (135) 86 110 111 113  Extension (0) -4 -2 -2 -4        Ankle      Dorsiflexion (20) 8 8 To neutral  6  Plantarflexion (50) WNL WNL WNL WNL  Inversion (35)  Eversion (15      (* = pain; Blank rows = not tested)  LE MMT: MMT (out of 5) Right 03/04/24 Left 03/04/24 Right 04/09/24 Left 04/09/24  Hip flexion 4- 4 4 4+  Hip extension      Hip abduction (seated) 5 5 5 5   Hip adduction (seated) 5 5 5 5   Hip internal rotation      Hip external rotation      Knee flexion 4- 4 4-* 4-*  Knee extension 3+ 4+ 3+* 3+*  Ankle dorsiflexion      Ankle plantarflexion      Ankle inversion      Ankle eversion      (* = pain; Blank rows = not tested)  Sensation Deferred  Reflexes Deferred  Palpation Location LEFT  RIGHT           Quadriceps  1  Medial Hamstrings    Lateral Hamstrings    Lateral Hamstring tendon    Medial Hamstring tendon    Quadriceps tendon    Patella  1  Patellar Tendon  1  Tibial Tuberosity  2  Medial joint line 1 2  Lateral joint line 1 1  MCL    LCL    Adductor Tubercle    Pes Anserine tendon    Infrapatellar  fat pad    Fibular head    Popliteal fossa    (Blank rows = not tested) Graded on 0-4 scale (0 = no pain, 1 = pain, 2 = pain with wincing/grimacing/flinching, 3 = pain with withdrawal, 4 = unwilling to allow palpation), (Blank rows = not tested)  Passive Accessory Motion Deferred   VASCULAR Dorsalis pedis and posterior tibial pulses are palpable No sign of acute DVT Homan's sign: Negative bilat     TODAY'S TREATMENT: 04/21/24   S/p R TKA 02/19/24, L TKA 01/08/24    SUBJECTIVE STATEMENT:   Patient reports 4/10 pain at arrival today. Patient reports having to re-schedule f/u with surgeon until end of August. She reports significant pain the last 2 days with prolonged walking and negotiating inclines. Patient reports doing well with established HEP. Pt reports doing well with stair ascent with reciprocal pattern; she uses step-to for descent and takes her time. Pt reports 4-5/10 pain at worst over the last week. Patient reports notable pain affecting her R knee most notably. Pt reports some ongoing swelling for her R knee. She feels she can continue on her own at this time with ongoing home exercise and return to work/activity.   AAROM R -2-117 L  -3-115   Therapeutic Exercise - for improved soft tissue flexibility and extensibility as needed for ROM, improved strength as needed to improve performance of CKC activities/functional movements  NuStep; Level 5 x 5 minutes; seat at 7 - for improved soft tissue mobility and increase bilat knee ROM (R TKA most acute)  Goblet squat with 8-lb Medball; 2 x 10  *not today* Sit to stand; 2 x 10 with 8-lb Medball hold, from standard chair (19 height) Minisquat with 6-lb Medball; 2 x 10 Seated LAQ, 5-lb ankle weight; 2 x 10 with 3 second hold each LE Supine SAQ; 2 x 10, 3 sec hold, blue bolster under R knee, 3-lb ankle weight    Therapeutic Activities - closed-chain movements and ambulatory tasks mimicking functional  activities/ADLs   Dynamic march, blue agility ladder; 4x D/B  Stair negotiation; up/down; x 3 with reciprocal pattern for ascent, step-to pattern for descent with RLE down  first  FSU to follow-through, 6-inch step; x15 on either LE   PATIENT EDUCATION: Discussed current progress with PT, continued HEP, and following up with MD if return to work is not improving   *not today* Forward dynamic minilunge, adjacent to treadmill armrest for UE support; x 15 alt R/L TRX squats; 2 x 10, depth to tolerance // bars: Minisquat; 2 x 10 // bars: forward/retro stepping; 4x D/B  -retro step for terminal knee extension Stairs; 4-step staircase in center of gym; up/down with reciprocal pattern and BUE support; x 3  -pt opts for step-to pattern during final trial of stair descent Total Gym double limb squat, depth to tolerance; x 20, Level 22 Ambulation in gym with SPC x 100' - cues for heel strike - good follow through     Manual Therapy - for symptom modulation, soft tissue sensitivity and mobility, joint mobility, ROM     R Patellar mobilizations (inferior/superior, medial/lateral) x 1 minutes  TF mobilization gr I-II 2 x 30 sec, posterior for pain control  R knee PROM with gentle overpressure into flexion/extension; x 5 minutes     Cold pack (unbilled) - for anti-inflammatory and analgesic effect as needed for reduced pain and improved ability to participate in active PT intervention, along R knee with leg elevated in supine, x 5 minutes    PATIENT EDUCATION:  Education details: see above for patient education details Person educated: Patient Education method: Explanation, Demonstration, and Handouts Education comprehension: verbalized understanding and returned demonstration   HOME EXERCISE PROGRAM:  Pt continuing with established home exercises from TKA packet  -pt instructed on propping knee for gravity-assisted extension stretch  Access Code: [email or web address] URL:  [email or web address] Date: 04/01/2024 Prepared by: Venetia Endo  Exercises - Sit to Stand  - 1 x daily - 5 x weekly - 2 sets - 10 reps - Standing Double Leg Mini Squat  - 1 x daily - 5 x weekly - 2 sets - 10 reps - Forward Step Up with Counter Support  - 1 x daily - 5 x weekly - 2 sets - 10 reps  ASSESSMENT:  CLINICAL IMPRESSION:   Pt is currently 8.5 weeks post-op for R knee and 14.5 weeks post-op for L knee (total knee arthroplasty performed on either side). Patient fortunately has minimal L knee pain at this time. She has more pain in R knee with prolonged weightbearing/ambulatory activity and intermittent swelling; we discussed possibility of intermittent edema even up to 3 months post-op. Pt has fortunately met LEFS goal and has functional knee ROM bilaterally. She is able to complete community-level gait without AD, though she does experience some R knee pain with higher-volume activity. Pt has returned to work starting with half days. She has experienced some R knee pain following completion of work shifts. We discussed following up with MD if return to work is not progressing well. Pt feels she is ready to continue on her own now with continued HEP and CKC exercises added to her program over last 2 weeks. Pt may continue with home-based program and continued return to work as tolerated. Pt instructed to follow-up with referring provider or PT if she is progressing suboptimally with return to work.   OBJECTIVE IMPAIRMENTS: Abnormal gait, decreased activity tolerance, decreased mobility, difficulty walking, decreased ROM, decreased strength, hypomobility, impaired flexibility, and pain.   ACTIVITY LIMITATIONS: carrying, lifting, sitting, standing, squatting, stairs, transfers, bed mobility, and locomotion level  PARTICIPATION LIMITATIONS: meal prep, cleaning, driving, shopping, community activity, and occupation  PERSONAL FACTORS: Past/current experiences and 3+  comorbidities: (currently being treated for bacteremia, ankylosing spondylitis, polyarthralgia, dyslipidemia) are also affecting patient's functional outcome.   REHAB POTENTIAL: Good  CLINICAL DECISION MAKING: Evolving/moderate complexity  EVALUATION COMPLEXITY: Moderate   GOALS: Goals reviewed with patient? Yes  SHORT TERM GOALS: Target date: 03/20/2024  Pt will be independent with HEP to improve strength and decrease knee pain to improve pain-free function at home and work. Baseline: 03/04/24: Pt continuing established HEP from hospital/TKA packet; discussed knee extension propped stretch and frequent work on heel slides.    04/09/24: Patient is consistent and verbalizes understanding of her home program.  Goal status: ACHIEVED   LONG TERM GOALS: Target date: 04/24/2024  Pt will have bilateral knee AROM 0-115 deg at least as needed for performance of ADLs and normalized extension for gait pattern Baseline: 03/04/24: Knee AROM R -4-86, L -2-110. 04/09/24: WFL extension bilaterally, mild deficit with knee flexion bilat.  Goal status: IN PROGRESS   2.  Pt will decrease worst knee pain by at least 3 points on the NPRS in order to demonstrate clinically significant reduction in knee pain. Baseline: 03/04/24: 6/10 pain at worst.  04/09/24: 5-6/10 at worst.  Goal status: NOT MET   3.  Pt will decrease LEFS score by at least 9 points in order demonstrate clinically significant reduction in knee pain/disability.       Baseline: 03/04/24: 30/80.  04/09/24: 46/80  Goal status: ACHIEVED   4.  Pt will increase strength of bilateral hip flexors and quads/hamstrings to at least 4+/5 or greater MMT grade in order to demonstrate improvement in strength and function needed for transferring and improved lower limb stability.,  Baseline: 03/04/24: MMT 3+ to 4 for aforementioned muscle groups. 04/09/24: Improved hip flexor strength, no improvement quad/HS Goal status: ON-GOING   5.  Pt will ambulate at  community level (660 feet or greater in office) with no AD and symmetrical weightbearing on either LE without major deviations as needed for return to independent mobility and return to work Baseline: 03/04/24: Limited distance tolerated for walking s/p R TKA. 04/09/24: 735 ft completed today, no notable increase in pain Goal status: ACHIEVED    PLAN: PT FREQUENCY: -  PT DURATION: -  PLANNED INTERVENTIONS: Therapeutic exercises, Therapeutic activity, Neuromuscular re-education, Balance training, Gait training, Patient/Family education, Self Care, Joint mobilization, DME instructions, Dry Needling, Electrical stimulation, Cryotherapy, Moist heat, Taping, Manual therapy, and Re-evaluation.  PLAN FOR NEXT SESSION: Pt continuing with return to work and graded increase in activity, continued HEP. Pt to f/u wth MD/PT if she has suboptimal progress or difficulty with full return to work.    Venetia Endo, PT, DPT #[id number]  Venetia ONEIDA Endo, PT 04/21/2024, 2:16 PM

## 2024-04-22 ENCOUNTER — Other Ambulatory Visit: Payer: Self-pay | Admitting: Internal Medicine

## 2024-04-22 ENCOUNTER — Other Ambulatory Visit: Payer: Self-pay

## 2024-04-24 ENCOUNTER — Other Ambulatory Visit: Payer: Self-pay

## 2024-04-24 NOTE — Telephone Encounter (Signed)
 Requested medications are due for refill today.  yes  Requested medications are on the active medications list.  yes  Last refill. 12/24/2023 #60 0 rf  Future visit scheduled.   no  Notes to clinic.  Please review for refill.    Requested Prescriptions  Pending Prescriptions Disp Refills   celecoxib  (CELEBREX ) 200 MG capsule 60 capsule 0    Sig: Take 1 capsule (200 mg total) by mouth 2 (two) times daily with a meal.     Analgesics:  COX2 Inhibitors Failed - 04/24/2024  4:36 PM      Failed - Manual Review: Labs are only required if the patient has taken medication for more than 8 weeks.      Passed - HGB in normal range and within 360 days    Hemoglobin  Date Value Ref Range Status  03/27/2024 12.2 12.0 - 15.0 g/dL Final  98/95/7978 86.8 11.1 - 15.9 g/dL Final         Passed - Cr in normal range and within 360 days    Creat  Date Value Ref Range Status  03/20/2024 0.67 0.50 - 0.99 mg/dL Final         Passed - HCT in normal range and within 360 days    HCT  Date Value Ref Range Status  03/27/2024 36.4 36.0 - 46.0 % Final   Hematocrit  Date Value Ref Range Status  09/15/2019 40.1 34.0 - 46.6 % Final         Passed - AST in normal range and within 360 days    AST  Date Value Ref Range Status  03/20/2024 17 10 - 35 U/L Final         Passed - ALT in normal range and within 360 days    ALT  Date Value Ref Range Status  03/20/2024 17 6 - 29 U/L Final         Passed - eGFR is 30 or above and within 360 days    EGFR (African American)  Date Value Ref Range Status  08/27/2014 >60 >69mL/min Final   GFR calc Af Amer  Date Value Ref Range Status  02/05/2020 >60 >60 mL/min Final   EGFR (Non-African Amer.)  Date Value Ref Range Status  08/27/2014 >60 >54mL/min Final    Comment:    eGFR values <79mL/min/1.73 m2 may be an indication of chronic kidney disease (CKD). Calculated eGFR, using the MRDR Study equation, is useful in  patients with stable renal  function. The eGFR calculation will not be reliable in acutely ill patients when serum creatinine is changing rapidly. It is not useful in patients on dialysis. The eGFR calculation may not be applicable to patients at the low and high extremes of body sizes, pregnant women, and vegetarians.    GFR, Estimated  Date Value Ref Range Status  03/06/2024 >60 >60 mL/min Final    Comment:    (NOTE) Calculated using the CKD-EPI Creatinine Equation (2021)    eGFR  Date Value Ref Range Status  03/20/2024 108 > OR = 60 mL/min/1.71m2 Final  07/07/2021 109 >59 mL/min/1.73 Final         Passed - Patient is not pregnant      Passed - Valid encounter within last 12 months    Recent Outpatient Visits           6 months ago Primary osteoarthritis of both knees   [facility], Leita DEL, MD  1 year ago Encounter for medication review and counseling   [facility] - A Dept Of [place]. [facility], Garnette CROME, RPH-CPP   2 years ago Encounter for medication review and counseling   [facility] - A Dept Of [place]. [facility] Fleeta Morris Garnette CROME, RPH-CPP   4 years ago Encounter for medication review   [facility] - A Dept Of [place]. [facility] Fleeta Morris Garnette CROME, RPH-CPP       Future Appointments             In 3 weeks Luiz Channel, MD [facility] - A Dept Of [place]. [facility], [place]

## 2024-04-25 ENCOUNTER — Other Ambulatory Visit: Payer: Self-pay

## 2024-04-25 MED FILL — Celecoxib Cap 200 MG: ORAL | 30 days supply | Qty: 60 | Fill #0 | Status: CN

## 2024-04-28 ENCOUNTER — Other Ambulatory Visit: Payer: Self-pay

## 2024-04-28 DIAGNOSIS — M170 Bilateral primary osteoarthritis of knee: Secondary | ICD-10-CM | POA: Diagnosis not present

## 2024-04-28 DIAGNOSIS — Z79899 Other long term (current) drug therapy: Secondary | ICD-10-CM | POA: Diagnosis not present

## 2024-04-28 DIAGNOSIS — L659 Nonscarring hair loss, unspecified: Secondary | ICD-10-CM | POA: Diagnosis not present

## 2024-04-28 DIAGNOSIS — R599 Enlarged lymph nodes, unspecified: Secondary | ICD-10-CM | POA: Diagnosis not present

## 2024-04-28 DIAGNOSIS — M457 Ankylosing spondylitis of lumbosacral region: Secondary | ICD-10-CM | POA: Diagnosis not present

## 2024-04-28 MED ORDER — CELECOXIB 100 MG PO CAPS
100.0000 mg | ORAL_CAPSULE | Freq: Two times a day (BID) | ORAL | 5 refills | Status: AC
  Filled 2024-04-28: qty 60, 30d supply, fill #0

## 2024-05-05 ENCOUNTER — Other Ambulatory Visit: Payer: Self-pay

## 2024-05-05 DIAGNOSIS — Z5189 Encounter for other specified aftercare: Secondary | ICD-10-CM | POA: Diagnosis not present

## 2024-05-05 MED ORDER — AMOXICILLIN 500 MG PO CAPS
500.0000 mg | ORAL_CAPSULE | ORAL | 2 refills | Status: DC
  Filled 2024-05-05: qty 4, 1d supply, fill #0

## 2024-05-08 ENCOUNTER — Other Ambulatory Visit: Payer: Self-pay | Admitting: Medical Genetics

## 2024-05-19 ENCOUNTER — Ambulatory Visit: Admitting: Internal Medicine

## 2024-06-06 DIAGNOSIS — E890 Postprocedural hypothyroidism: Secondary | ICD-10-CM | POA: Diagnosis not present

## 2024-06-10 ENCOUNTER — Ambulatory Visit: Payer: Self-pay

## 2024-06-10 ENCOUNTER — Other Ambulatory Visit: Payer: Self-pay | Admitting: Internal Medicine

## 2024-06-10 DIAGNOSIS — R220 Localized swelling, mass and lump, head: Secondary | ICD-10-CM

## 2024-06-10 NOTE — Progress Notes (Signed)
 Date:  06/10/2024   Name:  Hailey [name]   DOB:  17-Feb-1976   MRN:  [id number]   Chief Complaint: No chief complaint on file.  HPI  Review of Systems   Lab Results  Component Value Date   NA 138 03/20/2024   K 4.7 03/20/2024   CO2 24 03/20/2024   GLUCOSE 102 (H) 03/20/2024   BUN 12 03/20/2024   CREATININE 0.67 03/20/2024   CALCIUM 10.1 03/20/2024   EGFR 108 03/20/2024   GFRNONAA >60 03/06/2024   Lab Results  Component Value Date   CHOL 162 11/28/2023   HDL 61 11/28/2023   LDLCALC 81 11/28/2023   TRIG 98 11/28/2023   CHOLHDL 2.7 11/28/2023   Lab Results  Component Value Date   TSH 5.23 09/14/2023   Lab Results  Component Value Date   HGBA1C 5.8 (H) 09/15/2019   Lab Results  Component Value Date   WBC 10.2 03/27/2024   HGB 12.2 03/27/2024   HCT 36.4 03/27/2024   MCV 83.7 03/27/2024   PLT 471 (H) 03/27/2024   Lab Results  Component Value Date   ALT 17 03/20/2024   AST 17 03/20/2024   ALKPHOS 126 03/06/2024   BILITOT 0.4 03/20/2024   Lab Results  Component Value Date   VD25OH 29 12/02/2018     Patient Active Problem List   Diagnosis Date Noted   Bacteremia 02/27/2024   S/P total knee arthroplasty, right 02/19/2024   S/P total knee arthroplasty, left 01/08/2024   S/P total knee arthroplasty 01/08/2024   Atypical chest pain 11/23/2023   Preop cardiovascular exam 11/23/2023   Dyslipidemia 11/23/2023   Generalized lymphadenopathy 09/23/2021   B12 deficiency 09/23/2021   Fatigue 09/23/2021   Primary osteoarthritis of both knees 04/29/2021   Ankylosing spondylitis (HCC) 12/30/2018   Asthma in adult without complication 12/30/2018   Chronic diarrhea 12/30/2018   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   Interstitial cystitis 07/03/2016   Pelvic pain in female 07/03/2016   Chondromalacia of knee, 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   BMI 38.0-38.9,adult 02/14/2016   Polyarthralgia 02/14/2016   GERD (gastroesophageal reflux disease) 02/10/2014    Allergies   Allergen Reactions   Levofloxacin Rash and Dermatitis   Lodine [Etodolac] Shortness Of Breath   Adalimumab      Unknown reaction    Ciprofloxacin Hives   Latex Rash    Past Surgical History:  Procedure Laterality Date   BREAST BIOPSY Right 10/18/2015   bx/clip- neg   CHOLECYSTECTOMY N/A 07/07/2016   Procedure: LAPAROSCOPIC CHOLECYSTECTOMY;  Surgeon: Aloysius Plant, MD;  Location: [facility] ORS;  Service: General;  Laterality: N/A;   COLONOSCOPY WITH PROPOFOL  N/A 10/11/2015   Procedure: COLONOSCOPY WITH PROPOFOL ;  Surgeon: Gladis RAYMOND Mariner, MD;  Location: [facility] ENDOSCOPY;  Service: Endoscopy;  Laterality: N/A;   ESOPHAGOGASTRODUODENOSCOPY (EGD) WITH PROPOFOL  N/A 10/11/2015   Procedure: ESOPHAGOGASTRODUODENOSCOPY (EGD) WITH PROPOFOL ;  Surgeon: Gladis RAYMOND Mariner, MD;  Location: [facility] ENDOSCOPY;  Service: Endoscopy;  Laterality: N/A;   TONSILLECTOMY     TOTAL KNEE ARTHROPLASTY Left 01/08/2024   Procedure: LEFT TOTAL KNEE ARTHROPLASTYL;  Surgeon: Ernie Cough, MD;  Location: [facility] ORS;  Service: Orthopedics;  Laterality: Left;   TOTAL KNEE ARTHROPLASTY Right 02/19/2024   Procedure: ARTHROPLASTY, KNEE, TOTAL;  Surgeon: Ernie Cough, MD;  Location: [facility] ORS;  Service: Orthopedics;  Laterality: Right;   TOTAL THYROIDECTOMY  2005   goiter    Social History   Tobacco Use   Smoking status: Former    Current packs/day: 0.00  Average packs/day: 0.5 packs/day for 7.0 years (3.5 ttl pk-yrs)    Types: Cigarettes    Start date: 47    Quit date: 2003    Years since quitting: 22.7    Passive exposure: Never   Smokeless tobacco: Never  Vaping Use   Vaping status: Never Used  Substance Use Topics   Alcohol use: Yes    Comment: Occ.   Drug use: No     Medication list has been reviewed and updated.  No outpatient medications have been marked as taking for the 06/10/24 encounter (Orders Only) with Justus Leita DEL, MD.       04/04/2022    1:51 PM 10/20/2020   11:17 AM 10/11/2020    8:37 AM  GAD  7 : Generalized Anxiety Score  Nervous, Anxious, on Edge 0 0 0  Control/stop worrying 0 0 0  Worry too much - different things 1 0 0  Trouble relaxing 0 0 0  Restless 0 0 0  Easily annoyed or irritable 1 0 0  Afraid - awful might happen 0 0 0  Total GAD 7 Score 2 0 0  Anxiety Difficulty Not difficult at all Not difficult at all Not difficult at all       04/04/2022    1:51 PM 10/20/2021   10:18 AM 10/20/2020   11:17 AM  Depression screen PHQ 2/9  Decreased Interest 1 0 0  Down, Depressed, Hopeless 1 1 0  PHQ - 2 Score 2 1 0  Altered sleeping 1  0  Tired, decreased energy 1  0  Change in appetite 1  0  Feeling bad or failure about yourself  1  0  Trouble concentrating 0  0  Moving slowly or fidgety/restless 1  0  Suicidal thoughts 0  0  PHQ-9 Score 7  0  Difficult doing work/chores Not difficult at all  Not difficult at all    BP Readings from Last 3 Encounters:  03/20/24 123/85  03/06/24 120/78  03/01/24 (!) 141/98    Physical Exam  Wt Readings from Last 3 Encounters:  03/20/24 229 lb (103.9 kg)  02/27/24 238 lb 12.1 oz (108.3 kg)  02/19/24 229 lb (103.9 kg)    There were no vitals taken for this visit.  Assessment and Plan:  Problem List Items Addressed This Visit   None   No follow-ups on file.    Leita HILARIO Justus, MD [facility] [place]

## 2024-06-10 NOTE — Telephone Encounter (Signed)
 FYI Only or Action Required?: Action required by provider: referral request.  Patient was last seen in primary care on 10/23/2023 by Justus Leita DEL, MD.  Called Nurse Triage reporting Eye Pain.  Symptoms began several years ago.  Interventions attempted: Other: Has seen different providers regarding symptoms.  Symptoms are: gradually worsening.  Triage Disposition: See PCP Within 2 Weeks  Patient/caregiver understands and will follow disposition?: Yes     Copied from CRM [id number]. Topic: Clinical - Red Word Triage >> Jun 10, 2024 11:28 AM Hailey [name] wrote: Red Word that prompted transfer to Nurse Triage: eye and ear pain, facial swelling   ----------------------------------------------------------------------- From previous Reason for Contact - Scheduling: Patient/patient representative is calling to schedule an appointment. Refer to attachments for appointment information. Reason for Disposition  MILD eye pains are a recurrent problem  Answer Assessment - Initial Assessment Questions Patient requesting a referral for a 2nd opinion regarding her symptoms. Patient states she would like a referral to [facility]. Patient would like a call back regarding this request    1. ONSET: When did the pain start? (e.g., minutes, hours, days)     3 weeks worsening symptoms on going for years.  2. TIMING: Does the pain come and go, or has it been constant since it started? (e.g., constant, intermittent, fleeting)     Coems and goes  3. SEVERITY: How bad is the pain?  (Scale 1-10; mild, moderate or severe)     Mild to moderate  4. LOCATION: Where does it hurt?  (e.g., eyelid, eye, cheekbone)     Behind eyes  5. CAUSE: What do you think is causing the pain?     Unknown  6. VISION: Do you have blurred vision or changes in your vision?      Denies  7. EYE DISCHARGE: Is there any discharge (pus) from the eye(s)?  If Yes, ask: What color is it?      Denies  8. FEVER:  Do you have a fever? If Yes, ask: What is it, how was it measured, and when did it start?      Denies  9. OTHER SYMPTOMS: Do you have any other symptoms? (e.g., headache, nasal discharge, facial rash)     Facial swelling, swollen lymph nodes on neck and ear pain, headache over L eye  Protocols used: Eye Pain and Other Symptoms-A-AH

## 2024-06-13 ENCOUNTER — Ambulatory Visit: Admitting: Internal Medicine

## 2024-06-13 ENCOUNTER — Other Ambulatory Visit: Payer: Self-pay

## 2024-06-13 ENCOUNTER — Encounter: Payer: Self-pay | Admitting: Internal Medicine

## 2024-06-13 VITALS — BP 122/74 | HR 61 | Ht 65.0 in | Wt 234.0 lb

## 2024-06-13 DIAGNOSIS — R220 Localized swelling, mass and lump, head: Secondary | ICD-10-CM | POA: Insufficient documentation

## 2024-06-13 DIAGNOSIS — Z1331 Encounter for screening for depression: Secondary | ICD-10-CM | POA: Diagnosis not present

## 2024-06-13 DIAGNOSIS — Z1231 Encounter for screening mammogram for malignant neoplasm of breast: Secondary | ICD-10-CM | POA: Diagnosis not present

## 2024-06-13 DIAGNOSIS — E890 Postprocedural hypothyroidism: Secondary | ICD-10-CM | POA: Diagnosis not present

## 2024-06-13 MED ORDER — SYNTHROID 200 MCG PO TABS
200.0000 ug | ORAL_TABLET | Freq: Every day | ORAL | 3 refills | Status: AC
  Filled 2024-06-13 – 2024-08-11 (×2): qty 90, 90d supply, fill #0

## 2024-06-13 NOTE — Patient Instructions (Signed)
 Call [facility] to schedule your mammogram at [phone].

## 2024-06-13 NOTE — Assessment & Plan Note (Signed)
 Intermittent face and neck swelling - presumably reactive lymph nodes. However, has not had a definitive diagnosis of the cause. Recommend a second ENT consult. Would also encourage STAT CT with next episode.

## 2024-06-13 NOTE — Progress Notes (Signed)
 Date:  06/13/2024   Name:  Hailey [name]   DOB:  [birth year]   MRN:  [id number]   Chief Complaint: Ear Pain (ON and off for 1.5 years. Pain  is in neck face and ear. Comes and goes. Swollen neck, swollen lymph nodes. When this happens she gets a headache over her left eye. She said she scalp is itching, and her hair is also falling out. )  Otalgia  There is pain in the left ear. This is a recurrent problem. Associated symptoms include headaches (when swollen).  She has had issues with intermittent left neck swelling lymph nodes, left ear pain and left facial swelling over the past 1.5 years.  She has seen ENT and Rheumatology.  ENT says the nodes are too small to biopsy.  She has never had a CT scan when they are at the largest.  At times it is hard to swallow and she has left sided neck and upper chest discomfort as well.  Of note, she took antibiotics for infection after her knee surgery and had no issues during that time. She would like a referral to another ENT for evaluation.  I did stress that she needs a CT scan acutely when it recurs.  Review of Systems  Constitutional:  Negative for chills, fatigue and fever.  HENT:  Positive for ear pain (intermittent as in HPI), facial swelling (intermittent as in HP) and trouble swallowing.   Respiratory:  Negative for chest tightness and shortness of breath.   Cardiovascular:  Negative for chest pain and palpitations.  Neurological:  Positive for headaches (when swollen). Negative for dizziness and facial asymmetry.     Lab Results  Component Value Date   NA 138 03/20/2024   K 4.7 03/20/2024   CO2 24 03/20/2024   GLUCOSE 102 (H) 03/20/2024   BUN 12 03/20/2024   CREATININE 0.67 03/20/2024   CALCIUM 10.1 03/20/2024   EGFR 108 03/20/2024   GFRNONAA >60 03/06/2024   Lab Results  Component Value Date   CHOL 162 11/28/2023   HDL 61 11/28/2023   LDLCALC 81 11/28/2023   TRIG 98 11/28/2023   CHOLHDL 2.7 11/28/2023   Lab Results   Component Value Date   TSH 5.23 09/14/2023   Lab Results  Component Value Date   HGBA1C 5.8 (H) 09/15/2019   Lab Results  Component Value Date   WBC 10.2 03/27/2024   HGB 12.2 03/27/2024   HCT 36.4 03/27/2024   MCV 83.7 03/27/2024   PLT 471 (H) 03/27/2024   Lab Results  Component Value Date   ALT 17 03/20/2024   AST 17 03/20/2024   ALKPHOS 126 03/06/2024   BILITOT 0.4 03/20/2024   Lab Results  Component Value Date   VD25OH 29 12/02/2018     Patient Active Problem List   Diagnosis Date Noted   Left facial swelling 06/13/2024   Bacteremia 02/27/2024   S/P total knee arthroplasty, right 02/19/2024   S/P total knee arthroplasty, left 01/08/2024   S/P total knee arthroplasty 01/08/2024   Atypical chest pain 11/23/2023   Preop cardiovascular exam 11/23/2023   Dyslipidemia 11/23/2023   Generalized lymphadenopathy 09/23/2021   B12 deficiency 09/23/2021   Fatigue 09/23/2021   Primary osteoarthritis of both knees 04/29/2021   Ankylosing spondylitis (HCC) 12/30/2018   Asthma in adult without complication 12/30/2018   Chronic diarrhea 12/30/2018   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   Interstitial cystitis 07/03/2016   Pelvic pain in female 07/03/2016   Chondromalacia of knee,  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   BMI 38.0-38.9,adult 02/14/2016   Polyarthralgia 02/14/2016   GERD (gastroesophageal reflux disease) 02/10/2014    Allergies  Allergen Reactions   Levofloxacin Rash and Dermatitis   Lodine [Etodolac] Shortness Of Breath   Adalimumab      Unknown reaction    Ciprofloxacin Hives   Latex Rash    Past Surgical History:  Procedure Laterality Date   BREAST BIOPSY Right 10/18/2015   bx/clip- neg   CHOLECYSTECTOMY N/A 07/07/2016   Procedure: LAPAROSCOPIC CHOLECYSTECTOMY;  Surgeon: Aloysius Plant, MD;  Location: [facility] ORS;  Service: General;  Laterality: N/A;   COLONOSCOPY WITH PROPOFOL  N/A 10/11/2015   Procedure: COLONOSCOPY WITH PROPOFOL ;  Surgeon: Gladis RAYMOND Mariner, MD;   Location: [facility] ENDOSCOPY;  Service: Endoscopy;  Laterality: N/A;   ESOPHAGOGASTRODUODENOSCOPY (EGD) WITH PROPOFOL  N/A 10/11/2015   Procedure: ESOPHAGOGASTRODUODENOSCOPY (EGD) WITH PROPOFOL ;  Surgeon: Gladis RAYMOND Mariner, MD;  Location: [facility] ENDOSCOPY;  Service: Endoscopy;  Laterality: N/A;   TONSILLECTOMY     TOTAL KNEE ARTHROPLASTY Left 01/08/2024   Procedure: LEFT TOTAL KNEE ARTHROPLASTYL;  Surgeon: Ernie Cough, MD;  Location: [facility] ORS;  Service: Orthopedics;  Laterality: Left;   TOTAL KNEE ARTHROPLASTY Right 02/19/2024   Procedure: ARTHROPLASTY, KNEE, TOTAL;  Surgeon: Ernie Cough, MD;  Location: [facility] ORS;  Service: Orthopedics;  Laterality: Right;   TOTAL THYROIDECTOMY  2005   goiter    Social History   Tobacco Use   Smoking status: Former    Current packs/day: 0.00    Average packs/day: 0.5 packs/day for 7.0 years (3.5 ttl pk-yrs)    Types: Cigarettes    Start date: 53    Quit date: 2003    Years since quitting: 22.7    Passive exposure: Never   Smokeless tobacco: Never  Vaping Use   Vaping status: Never Used  Substance Use Topics   Alcohol use: Yes    Comment: Occ.   Drug use: No     Medication list has been reviewed and updated.  Current Meds  Medication Sig   albuterol  (VENTOLIN  HFA) 108 (90 Base) MCG/ACT inhaler Inhale 2 puffs into the lungs every 6 (six) hours as needed for wheezing or shortness of breath.   celecoxib  (CELEBREX ) 100 MG capsule Take 1 capsule (100 mg total) by mouth 2 (two) times daily   fluticasone -salmeterol (ADVAIR  DISKUS) 100-50 MCG/ACT AEPB Inhale 1 puff into the lungs 2 (two) times daily as needed.   Multiple Vitamin (MULTIVITAMIN WITH MINERALS) TABS tablet Take 1 tablet by mouth daily.   SYNTHROID  200 MCG tablet Take 1 tablet (200 mcg total) by mouth once daily. Take on an empty stomach with a glass of water  at least 30-60 minutes before breakfast.   [DISCONTINUED] aspirin  81 MG chewable tablet Chew 1 tablet (81 mg total) by mouth 2 (two) times  daily.   [DISCONTINUED] celecoxib  (CELEBREX ) 200 MG capsule Take 1 capsule (200 mg total) by mouth 2 (two) times daily with a meal.       06/13/2024    8:35 AM 04/04/2022    1:51 PM 10/20/2020   11:17 AM 10/11/2020    8:37 AM  GAD 7 : Generalized Anxiety Score  Nervous, Anxious, on Edge 0 0 0 0  Control/stop worrying 0 0 0 0  Worry too much - different things 0 1 0 0  Trouble relaxing 0 0 0 0  Restless 0 0 0 0  Easily annoyed or irritable 0 1 0 0  Afraid - awful might happen 0 0 0 0  Total GAD 7 Score 0 2 0 0  Anxiety Difficulty Not difficult at all Not difficult at all Not difficult at all Not difficult at all       06/13/2024    8:34 AM 04/04/2022    1:51 PM 10/20/2021   10:18 AM  Depression screen PHQ 2/9  Decreased Interest 2 1 0  Down, Depressed, Hopeless 2 1 1   PHQ - 2 Score 4 2 1   Altered sleeping 3 1   Tired, decreased energy 3 1   Change in appetite 3 1   Feeling bad or failure about yourself  3 1   Trouble concentrating 0 0   Moving slowly or fidgety/restless 0 1   Suicidal thoughts 0 0   PHQ-9 Score 16 7   Difficult doing work/chores Somewhat difficult Not difficult at all     BP Readings from Last 3 Encounters:  06/13/24 122/74  03/20/24 123/85  03/06/24 120/78    Physical Exam Vitals and nursing note reviewed.  Constitutional:      General: She is not in acute distress.    Appearance: Normal appearance. She is well-developed.  HENT:     Head: Normocephalic and atraumatic.     Right Ear: Tympanic membrane normal.     Left Ear: Tympanic membrane normal.     Nose:     Right Sinus: No maxillary sinus tenderness or frontal sinus tenderness.     Left Sinus: No maxillary sinus tenderness or frontal sinus tenderness.  Neck:     Vascular: No carotid bruit.  Cardiovascular:     Rate and Rhythm: Normal rate and regular rhythm.  Pulmonary:     Effort: Pulmonary effort is normal. No respiratory distress.     Breath sounds: No wheezing or rhonchi.   Musculoskeletal:     Cervical back: Normal range of motion. Tenderness present.     Right lower leg: No edema.     Left lower leg: No edema.  Lymphadenopathy:     Cervical: No cervical adenopathy.  Skin:    General: Skin is warm and dry.     Findings: No rash.  Neurological:     Mental Status: She is alert and oriented to person, place, and time.  Psychiatric:        Mood and Affect: Mood normal.        Behavior: Behavior normal.     Wt Readings from Last 3 Encounters:  06/13/24 234 lb (106.1 kg)  03/20/24 229 lb (103.9 kg)  02/27/24 238 lb 12.1 oz (108.3 kg)    BP 122/74   Pulse 61   Ht 5' 5 (1.651 m)   Wt 234 lb (106.1 kg)   SpO2 98%   BMI 38.94 kg/m   Assessment and Plan:  Problem List Items Addressed This Visit       Unprioritized   Left facial swelling - Primary   Intermittent face and neck swelling - presumably reactive lymph nodes. However, has not had a definitive diagnosis of the cause. Recommend a second ENT consult. Would also encourage STAT CT with next episode.      Relevant Orders   Ambulatory referral to ENT   Other Visit Diagnoses       Encounter for screening mammogram for breast cancer       Relevant Orders   MM 3D SCREENING MAMMOGRAM BILATERAL BREAST       No follow-ups on file.    Leita HILARIO Adie, MD [facility] [place]

## 2024-06-16 ENCOUNTER — Other Ambulatory Visit: Payer: Self-pay

## 2024-06-16 MED ORDER — FLUZONE 0.5 ML IM SUSY
0.5000 mL | PREFILLED_SYRINGE | Freq: Once | INTRAMUSCULAR | 0 refills | Status: AC
  Filled 2024-06-16: qty 0.5, 1d supply, fill #0

## 2024-07-11 ENCOUNTER — Institutional Professional Consult (permissible substitution) (INDEPENDENT_AMBULATORY_CARE_PROVIDER_SITE_OTHER): Admitting: Otolaryngology

## 2024-07-16 ENCOUNTER — Other Ambulatory Visit: Payer: Self-pay | Admitting: Medical Genetics

## 2024-07-16 DIAGNOSIS — Z006 Encounter for examination for normal comparison and control in clinical research program: Secondary | ICD-10-CM

## 2024-07-28 ENCOUNTER — Institutional Professional Consult (permissible substitution) (INDEPENDENT_AMBULATORY_CARE_PROVIDER_SITE_OTHER)

## 2024-08-11 ENCOUNTER — Other Ambulatory Visit: Payer: Self-pay

## 2024-08-18 ENCOUNTER — Telehealth: Admitting: Family Medicine

## 2024-08-18 DIAGNOSIS — B9689 Other specified bacterial agents as the cause of diseases classified elsewhere: Secondary | ICD-10-CM | POA: Diagnosis not present

## 2024-08-18 DIAGNOSIS — N760 Acute vaginitis: Secondary | ICD-10-CM | POA: Diagnosis not present

## 2024-08-18 MED ORDER — METRONIDAZOLE 0.75 % VA GEL
1.0000 | Freq: Two times a day (BID) | VAGINAL | 0 refills | Status: DC
  Filled 2024-08-18: qty 70, 5d supply, fill #0

## 2024-08-18 NOTE — Progress Notes (Signed)
 We are sorry that you are not feeling well. Here is how we plan to help! Based on what you shared with me it looks like you: May have a vaginosis due to bacteria  Vaginosis is an inflammation of the vagina that can result in discharge, itching and pain. The cause is usually a change in the normal balance of vaginal bacteria or an infection. Vaginosis can also result from reduced estrogen levels after menopause.  The most common causes of vaginosis are:   Bacterial vaginosis which results from an overgrowth of one on several organisms that are normally present in your vagina.   Yeast infections which are caused by a naturally occurring fungus called candida.   Vaginal atrophy (atrophic vaginosis) which results from the thinning of the vagina from reduced estrogen levels after menopause.   Trichomoniasis which is caused by a parasite and is commonly transmitted by sexual intercourse.  Factors that increase your risk of developing vaginosis include: Medications, such as antibiotics and steroids Uncontrolled diabetes Use of hygiene products such as bubble bath, vaginal spray or vaginal deodorant Douching Wearing damp or tight-fitting clothing Using an intrauterine device (IUD) for birth control Hormonal changes, such as those associated with pregnancy, birth control pills or menopause Sexual activity Having a sexually transmitted infection  Your treatment plan is metrogel .   Be sure to take all of the medication as directed. Stop taking any medication if you develop a rash, tongue swelling or shortness of breath. Mothers who are breast feeding should consider pumping and discarding their breast milk while on these antibiotics. However, there is no consensus that infant exposure at these doses would be harmful.  Remember that medication creams can weaken latex condoms.   HOME CARE:  Good hygiene may prevent some types of vaginosis from recurring and may relieve some symptoms:  Avoid  baths, hot tubs and whirlpool spas. Rinse soap from your outer genital area after a shower, and dry the area well to prevent irritation. Don't use scented or harsh soaps, such as those with deodorant or antibacterial action. Avoid irritants. These include scented tampons and pads. Wipe from front to back after using the toilet. Doing so avoids spreading fecal bacteria to your vagina.  Other things that may help prevent vaginosis include:  Don't douche. Your vagina doesn't require cleansing other than normal bathing. Repetitive douching disrupts the normal organisms that reside in the vagina and can actually increase your risk of vaginal infection. Douching won't clear up a vaginal infection. Use a latex condom. Both female and female latex condoms may help you avoid infections spread by sexual contact. Wear cotton underwear. Also wear pantyhose with a cotton crotch. If you feel comfortable without it, skip wearing underwear to bed. Yeast thrives in [place] [facility] Your symptoms should improve in the next day or two.  GET HELP RIGHT AWAY IF:  You have pain in your lower abdomen ( pelvic area or over your ovaries) You develop nausea or vomiting You develop a fever Your discharge changes or worsens You have persistent pain with intercourse You develop shortness of breath, a rapid pulse, or you faint.  These symptoms could be signs of problems or infections that need to be evaluated by a medical provider now.  MAKE SURE YOU   Understand these instructions. Will watch your condition. Will get help right away if you are not doing well or get worse.  Your e-visit answers were reviewed by a board certified advanced clinical practitioner to complete your personal care  plan. Depending upon the condition, your plan could have included both over the counter or prescription medications. Please review your pharmacy choice to make sure that you have choses a pharmacy that is open for you to pick up  any needed prescription, Your safety is important to us . If you have drug allergies check your prescription carefully.   You can use MyChart to ask questions about today's visit, request a non-urgent call back, or ask for a work or school excuse for 24 hours related to this e-Visit. If it has been greater than 24 hours you will need to follow up with your provider, or enter a new e-Visit to address those concerns. You will get a MyChart message within the next two days asking about your experience. I hope that your e-visit has been valuable and will speed your recovery.  I have spent 5 minutes in review of e-visit questionnaire, review and updating patient chart, medical decision making and response to patient.   Sotero Brinkmeyer, FNP

## 2024-08-19 ENCOUNTER — Other Ambulatory Visit: Payer: Self-pay

## 2024-08-19 LAB — GENECONNECT MOLECULAR SCREEN: Genetic Analysis Overall Interpretation: NEGATIVE

## 2024-09-12 ENCOUNTER — Other Ambulatory Visit: Payer: Self-pay

## 2024-09-12 MED ORDER — FLUCONAZOLE 150 MG PO TABS
150.0000 mg | ORAL_TABLET | ORAL | 1 refills | Status: DC
  Filled 2024-09-12: qty 1, 3d supply, fill #0

## 2024-09-22 ENCOUNTER — Other Ambulatory Visit: Payer: Self-pay

## 2024-10-08 ENCOUNTER — Ambulatory Visit

## 2024-10-08 DIAGNOSIS — E890 Postprocedural hypothyroidism: Secondary | ICD-10-CM

## 2024-10-08 DIAGNOSIS — R591 Generalized enlarged lymph nodes: Secondary | ICD-10-CM | POA: Diagnosis not present

## 2024-10-08 DIAGNOSIS — Z96653 Presence of artificial knee joint, bilateral: Secondary | ICD-10-CM

## 2024-10-08 DIAGNOSIS — J4520 Mild intermittent asthma, uncomplicated: Secondary | ICD-10-CM

## 2024-10-08 DIAGNOSIS — Z1231 Encounter for screening mammogram for malignant neoplasm of breast: Secondary | ICD-10-CM | POA: Diagnosis not present

## 2024-10-08 DIAGNOSIS — Z131 Encounter for screening for diabetes mellitus: Secondary | ICD-10-CM

## 2024-10-08 DIAGNOSIS — Z136 Encounter for screening for cardiovascular disorders: Secondary | ICD-10-CM

## 2024-10-08 MED ORDER — WEGOVY 1.5 MG PO TABS: 1.5000 mg | ORAL_TABLET | Freq: Every day | ORAL | 0 refills | Status: AC

## 2024-10-08 NOTE — Progress Notes (Signed)
 "   New patient visit   Patient: Hailey [name]   DOB: Sep 03, 1976   48 y.o. Female  MRN: [id number] Visit Date: 10/08/2024  Today's healthcare provider: Isaiah DELENA Pepper, MD   Chief Complaint  Patient presents with   Establish Care   Subjective    Hailey [name] is a 49 y.o. female who presents today as a new patient to establish care.   Discussed the use of AI scribe software for clinical note transcription with the patient, who gave verbal consent to proceed.  History of Present Illness Hailey [name] is a 49 year old female who presents for a consultation regarding weight management with Wegovy .  She is interested in starting Wegovy  pills for weight management, having previously used semaglutide  injections successfully prior to her knee replacements. The injections helped her lose 15 pounds and improved her arthritis symptoms, particularly in her knees, shoulders, and elbows. She previously paid out of pocket for the Wegovy  injections.  She has a history of hyperthyroidism and had her thyroid  removed in 2004 due to a goiter. She currently takes Synthroid  200 mcg daily. She is concerned about her metabolism and weight management due to the absence of her thyroid . She has been working out during her lunch breaks to aid in weight loss.  She has a history of asthma, for which she uses albuterol  as needed and a steroid inhaler when sick. She has not had pneumonia and wears a mask regularly to prevent respiratory infections.  She has a history of ankylosing spondylitis and occasionally takes Celebrex  for pain management. She has not been following up with rheumatology recently.  She reports chronic swelling of lymph nodes on the side of her L neck, facial pain, and changes in vision. She has had multiple CTs and MRIs, and rheumatology suggested a biopsy, but ENT has not proceeded with it. She experiences intermittent swelling without a clear pattern and has been managing the symptoms without  further intervention.  She is due for a mammogram and has a history of large ovarian cysts, for which she is seeing an OBGYN. She has not had a Pap smear in several years and experiences cramping without menstruation.   Past Medical History:  Diagnosis Date   Allergic state    Allergy    Ankylosing spondylitis (HCC)    followed by rheumatology Dr. Tobie   Arthritis    Asthma    Cervical disc disease    Chest pain    Depression    Enlarged lymph nodes    GERD (gastroesophageal reflux disease)    H/O colonoscopy    HPV (human papilloma virus) infection    Hypothyroidism    Interstitial cystitis    LGSIL (low grade squamous intraepithelial dysplasia)    Paresthesia    Pre-diabetes    Status post laparoscopic cholecystectomy 07/13/2016   Past Surgical History:  Procedure Laterality Date   BREAST BIOPSY Right 10/18/2015   bx/clip- neg   CHOLECYSTECTOMY N/A 07/07/2016   Procedure: LAPAROSCOPIC CHOLECYSTECTOMY;  Surgeon: Aloysius Plant, MD;  Location: [facility] ORS;  Service: General;  Laterality: N/A;   COLONOSCOPY WITH PROPOFOL  N/A 10/11/2015   Procedure: COLONOSCOPY WITH PROPOFOL ;  Surgeon: Gladis RAYMOND Mariner, MD;  Location: [facility] ENDOSCOPY;  Service: Endoscopy;  Laterality: N/A;   ESOPHAGOGASTRODUODENOSCOPY (EGD) WITH PROPOFOL  N/A 10/11/2015   Procedure: ESOPHAGOGASTRODUODENOSCOPY (EGD) WITH PROPOFOL ;  Surgeon: Gladis RAYMOND Mariner, MD;  Location: [facility] END[facility]COPY;  Service: Endoscopy;  Laterality: N/A;   TONSILLECTOMY     TOTAL  KNEE ARTHROPLASTY Left 01/08/2024   Procedure: LEFT TOTAL KNEE ARTHROPLASTYL;  Surgeon: Ernie Cough, MD;  Location: [facility] ORS;  Service: Orthopedics;  Laterality: Left;   TOTAL KNEE ARTHROPLASTY Right 02/19/2024   Procedure: ARTHROPLASTY, KNEE, TOTAL;  Surgeon: Ernie Cough, MD;  Location: [facility] ORS;  Service: Orthopedics;  Laterality: Right;   TOTAL THYROIDECTOMY  2005   goiter   Family Status  Relation Name Status   Mother  Deceased   Father  Alive   Brother  Alive    Youth Worker  (Not Specified)   MGM  Alive   Cousin  Alive  No partnership data on file   Family History  Problem Relation Age of Onset   Cancer Mother        Vulva cancer   Hypertension Mother    Stroke Mother    Goiter Mother    Diabetes Father    Hypertension Father    Hypertension Brother    Bladder Cancer Maternal Aunt    Hypertension Maternal Grandmother    Stroke Maternal Grandmother    Diabetes Maternal Grandmother    Breast cancer Cousin 61       pat cousin   Social History   Socioeconomic History   Marital status: Married    Spouse name: Not on file   Number of children: 1   Years of education: Not on file   Highest education level: Not on file  Occupational History   Not on file  Tobacco Use   Smoking status: Former    Current packs/day: 0.00    Average packs/day: 0.5 packs/day for 7.0 years (3.5 ttl pk-yrs)    Types: Cigarettes    Start date: 35    Quit date: 2003    Years since quitting: 23.0    Passive exposure: Never   Smokeless tobacco: Never  Vaping Use   Vaping status: Never Used  Substance and Sexual Activity   Alcohol use: Yes    Comment: Occ.   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    Social Drivers of Health   Tobacco Use: Medium Risk (10/08/2024)   Patient History    Smoking Tobacco Use: Former    Smokeless Tobacco Use: Never    Passive Exposure: Never  Physicist, Medical Strain: Not on file  Food Insecurity: No Food Insecurity (02/27/2024)   Epic    Worried About Programme Researcher, Broadcasting/film/video in the Last Year: Never true    Ran Out of Food in the Last Year: Never true  Transportation Needs: No Transportation Needs (02/27/2024)   Epic    Lack of Transportation (Medical): No    Lack of Transportation (Non-Medical): No  Physical Activity: Not on file   Stress: Not on file  Social Connections: Not on file  Depression (PHQ2-9): Low Risk (10/08/2024)   Depression (PHQ2-9)    PHQ-2 Score: 0  Alcohol Screen: Not on file  Housing: Low Risk (02/27/2024)   Epic    Unable to Pay for Housing in the Last Year: No    Number of Times Moved in the Last Year: 0    Homeless in the Last Year: No  Utilities: Not At Risk (02/27/2024)   Epic    Threatened with loss of utilities: No  Health Literacy: Not on file  Show/hide medication list[1] Allergies[2]  Reviews of Systems as noted in HPI.      Objective    BP 111/74   Pulse 64   Temp 97.7 F (36.5 C) (Oral)   Ht 5' 4 (1.626 m)   Wt 243 lb 4.8 oz (110.4 kg)   LMP 05/29/2024 (Exact Date)   BMI 41.76 kg/m     Physical Exam Constitutional:      Appearance: Normal appearance.  HENT:     Head: Normocephalic and atraumatic.     Mouth/Throat:     Mouth: Mucous membranes are moist.  Eyes:     Pupils: Pupils are equal, round, and reactive to light.  Pulmonary:     Effort: Pulmonary effort is normal.  Lymphadenopathy:     Cervical: Cervical adenopathy present.     Right cervical: No superficial or posterior cervical adenopathy.    Left cervical: Superficial cervical adenopathy and posterior cervical adenopathy present.  Skin:    General: Skin is warm.  Neurological:     General: No focal deficit present.     Mental Status: She is alert.     Depression Screen    10/08/2024   11:16 AM 06/13/2024    8:34 AM 04/04/2022    1:51 PM 10/20/2021   10:18 AM  PHQ 2/9 Scores  PHQ - 2 Score 0 4 2 1   PHQ- 9 Score 0 16  7       Data saved with a previous flowsheet row definition   No results found for any visits on 10/08/24.  Assessment & Plan      Problem List Items Addressed This Visit       Respiratory   Asthma in adult without complication     Endocrine   Post-surgical hypothyroidism     Immune and Lymphatic   Generalized lymphadenopathy   Relevant Orders   Ambulatory  referral to ENT     Other   S/P total knee arthroplasty   Morbid obesity (HCC) - Primary   Relevant Medications   semaglutide -weight management (WEGOVY ) 1.5 MG tablet   Other Relevant Orders   Comprehensive metabolic panel with GFR   Other Visit Diagnoses       Screening mammogram for breast cancer       Relevant Orders   MM 3D SCREENING MAMMOGRAM BILATERAL BREAST     Screening for cardiovascular condition       Relevant Orders   Lipid panel     Screening for diabetes mellitus (DM)       Relevant Orders   Hemoglobin A1c       Assessment & Plan Morbid obesity (HCC) BMI 41. Previous successful weight loss using semaglutide  injections. Interested in starting Wegovy  (oral semaglutide ) for weight management. No family history of thyroid  cancer and no personal history of pancreatitis.  Discussed cost and availability through [facility] pharmacy. She prefers to start with the oral medication due to cost considerations. - Initiated Wegovy  at 1.5 mg daily, to be increased to 4 mg after one month if tolerated. - Provided information for [facility] pharmacy for medication delivery. - Scheduled follow-up in one month to assess response to medication. - Discussed eating a balanced diet and incorporating movement into daily routine.  - Will obtain CMP, A1c, lipid panel for risk stratification  Generalized lymphadenopathy Chronic lymphadenopathy with facial pain, ear pain, and eye pain. Previous imaging and ENT, ID, rheumatology evaluations have not led to a definitive diagnosis. Most likely reactive. She has been advised  to seek a second opinion due to persistent symptoms. - Referred to ENT in [facility] for a second opinion and potential biopsy of lymph nodes.  Asthma Well-controlled with occasional use of albuterol  inhaler. - Continue current asthma management with albuterol  inhaler as needed.  Post-thyroidectomy hypothyroidism Managed with Synthroid  200 mcg daily. - Continue  Synthroid  200 mcg daily. - Follow up with endocrinology  Status post bilateral knee replacement Improved mobility and reduced pain. Previous weight loss with semaglutide  injections contributed to improved joint function. - Encouraged continued weight management to support joint health.  General health maintenance Discussed mammogram referral and Pap smear. She is due for a Pap smear and has an upcoming appointment with OBGYN. Discussed pneumonia vaccination, but she declined at this time. - Placed referral for mammogram - Discussed pneumonia vaccination; she declined at this time.    Return in about 4 weeks (around 11/05/2024) for Follow Up Weight Check.      Isaiah DELENA Pepper, MD  [facility] [phone] (phone) [phone] (fax)     [1]  Outpatient Medications Prior to Visit  Medication Sig   albuterol  (VENTOLIN  HFA) 108 (90 Base) MCG/ACT inhaler Inhale 2 puffs into the lungs every 6 (six) hours as needed for wheezing or shortness of breath.   celecoxib  (CELEBREX ) 100 MG capsule Take 1 capsule (100 mg total) by mouth 2 (two) times daily   fluticasone -salmeterol (ADVAIR  DISKUS) 100-50 MCG/ACT AEPB Inhale 1 puff into the lungs 2 (two) times daily as needed.   SYNTHROID  200 MCG tablet Take 1 tablet (200 mcg total) by mouth daily. Take on an empty stomach with a glass of water  at least 30-60 minutes before breakfast.   [DISCONTINUED] etanercept  (ENBREL  SURECLICK) 50 MG/ML injection 1 mL. (Patient not taking: Reported on 10/08/2024)   [DISCONTINUED] fluconazole  (DIFLUCAN ) 150 MG tablet Take 1 tablet by mouth once. May repeat in 3 days if symptoms persist. (Patient not taking: Reported on 10/08/2024)   [DISCONTINUED] metroNIDAZOLE  (METROGEL ) 0.75 % vaginal gel Place 1 Applicatorful vaginally 2 (two) times daily. (Patient not taking: Reported on 10/08/2024)   [DISCONTINUED] Multiple Vitamin (MULTIVITAMIN WITH MINERALS) TABS tablet Take 1 tablet by mouth daily.  (Patient not taking: Reported on 10/08/2024)   [DISCONTINUED] SYNTHROID  200 MCG tablet Take 1 tablet (200 mcg total) by mouth once daily. Take on an empty stomach with a glass of water  at least 30-60 minutes before breakfast.   No facility-administered medications prior to visit.  [2]  Allergies Allergen Reactions   Levofloxacin Rash and Dermatitis   Lodine [Etodolac] Shortness Of Breath   Adalimumab      Unknown reaction    Ciprofloxacin Hives   Latex Rash   "

## 2024-10-09 ENCOUNTER — Ambulatory Visit: Payer: Self-pay

## 2024-10-09 DIAGNOSIS — R748 Abnormal levels of other serum enzymes: Secondary | ICD-10-CM

## 2024-10-09 LAB — LIPID PANEL
Chol/HDL Ratio: 2.9 ratio (ref 0.0–4.4)
Cholesterol, Total: 203 mg/dL — ABNORMAL HIGH (ref 100–199)
HDL: 71 mg/dL
LDL Chol Calc (NIH): 107 mg/dL — ABNORMAL HIGH (ref 0–99)
Triglycerides: 143 mg/dL (ref 0–149)
VLDL Cholesterol Cal: 25 mg/dL (ref 5–40)

## 2024-10-09 LAB — COMPREHENSIVE METABOLIC PANEL WITH GFR
ALT: 17 [IU]/L (ref 0–32)
AST: 15 [IU]/L (ref 0–40)
Albumin: 4.5 g/dL (ref 3.9–4.9)
Alkaline Phosphatase: 166 [IU]/L — ABNORMAL HIGH (ref 41–116)
BUN/Creatinine Ratio: 20 (ref 9–23)
BUN: 13 mg/dL (ref 6–24)
Bilirubin Total: 0.5 mg/dL (ref 0.0–1.2)
CO2: 22 mmol/L (ref 20–29)
Calcium: 10 mg/dL (ref 8.7–10.2)
Chloride: 101 mmol/L (ref 96–106)
Creatinine, Ser: 0.66 mg/dL (ref 0.57–1.00)
Globulin, Total: 2.9 g/dL (ref 1.5–4.5)
Glucose: 97 mg/dL (ref 70–99)
Potassium: 4.8 mmol/L (ref 3.5–5.2)
Sodium: 138 mmol/L (ref 134–144)
Total Protein: 7.4 g/dL (ref 6.0–8.5)
eGFR: 108 mL/min/{1.73_m2}

## 2024-10-09 LAB — HEMOGLOBIN A1C
Est. average glucose Bld gHb Est-mCnc: 126 mg/dL
Hgb A1c MFr Bld: 6 % — ABNORMAL HIGH (ref 4.8–5.6)

## 2024-10-11 LAB — PARATHYROID HORMONE, INTACT (NO CA): PTH: 37 pg/mL (ref 15–65)

## 2024-10-11 LAB — COMPREHENSIVE METABOLIC PANEL WITH GFR
ALT: 15 [IU]/L (ref 0–32)
AST: 16 [IU]/L (ref 0–40)
Albumin: 4.4 g/dL (ref 3.9–4.9)
Alkaline Phosphatase: 152 [IU]/L — ABNORMAL HIGH (ref 41–116)
BUN/Creatinine Ratio: 19 (ref 9–23)
BUN: 14 mg/dL (ref 6–24)
Bilirubin Total: 0.4 mg/dL (ref 0.0–1.2)
CO2: 23 mmol/L (ref 20–29)
Calcium: 9.7 mg/dL (ref 8.7–10.2)
Chloride: 104 mmol/L (ref 96–106)
Creatinine, Ser: 0.72 mg/dL (ref 0.57–1.00)
Globulin, Total: 2.7 g/dL (ref 1.5–4.5)
Glucose: 104 mg/dL — ABNORMAL HIGH (ref 70–99)
Potassium: 4.5 mmol/L (ref 3.5–5.2)
Sodium: 139 mmol/L (ref 134–144)
Total Protein: 7.1 g/dL (ref 6.0–8.5)
eGFR: 103 mL/min/{1.73_m2}

## 2024-10-11 LAB — VITAMIN D 25 HYDROXY (VIT D DEFICIENCY, FRACTURES): Vit D, 25-Hydroxy: 26.4 ng/mL — ABNORMAL LOW (ref 30.0–100.0)

## 2024-10-11 LAB — GAMMA GT: GGT: 26 [IU]/L (ref 0–60)

## 2024-11-10 ENCOUNTER — Ambulatory Visit

## 2024-12-01 ENCOUNTER — Institutional Professional Consult (permissible substitution) (INDEPENDENT_AMBULATORY_CARE_PROVIDER_SITE_OTHER)
